# Patient Record
Sex: Female | Born: 1945
Health system: Southern US, Community
[De-identification: ages and names within clinical notes are randomized; demographics above are authoritative.]

## PROBLEM LIST (undated history)

## (undated) DIAGNOSIS — M1990 Unspecified osteoarthritis, unspecified site: Secondary | ICD-10-CM

## (undated) DIAGNOSIS — T8859XA Other complications of anesthesia, initial encounter: Secondary | ICD-10-CM

## (undated) DIAGNOSIS — M8580 Other specified disorders of bone density and structure, unspecified site: Secondary | ICD-10-CM

## (undated) DIAGNOSIS — C801 Malignant (primary) neoplasm, unspecified: Secondary | ICD-10-CM

## (undated) DIAGNOSIS — K754 Autoimmune hepatitis: Secondary | ICD-10-CM

## (undated) DIAGNOSIS — Z9889 Other specified postprocedural states: Secondary | ICD-10-CM

## (undated) DIAGNOSIS — K219 Gastro-esophageal reflux disease without esophagitis: Secondary | ICD-10-CM

## (undated) DIAGNOSIS — J45909 Unspecified asthma, uncomplicated: Secondary | ICD-10-CM

## (undated) DIAGNOSIS — E079 Disorder of thyroid, unspecified: Secondary | ICD-10-CM

## (undated) DIAGNOSIS — I999 Unspecified disorder of circulatory system: Secondary | ICD-10-CM

## (undated) DIAGNOSIS — T4145XA Adverse effect of unspecified anesthetic, initial encounter: Secondary | ICD-10-CM

## (undated) DIAGNOSIS — R112 Nausea with vomiting, unspecified: Secondary | ICD-10-CM

## (undated) DIAGNOSIS — E785 Hyperlipidemia, unspecified: Secondary | ICD-10-CM

## (undated) DIAGNOSIS — M549 Dorsalgia, unspecified: Secondary | ICD-10-CM

## (undated) DIAGNOSIS — T7840XA Allergy, unspecified, initial encounter: Secondary | ICD-10-CM

## (undated) DIAGNOSIS — E039 Hypothyroidism, unspecified: Secondary | ICD-10-CM

## (undated) DIAGNOSIS — K449 Diaphragmatic hernia without obstruction or gangrene: Secondary | ICD-10-CM

## (undated) HISTORY — PX: KNEE SURGERY: SHX244

## (undated) HISTORY — DX: Unspecified osteoarthritis, unspecified site: M19.90

## (undated) HISTORY — PX: BASAL CELL CARCINOMA EXCISION: SHX1214

## (undated) HISTORY — DX: Unspecified disorder of circulatory system: I99.9

## (undated) HISTORY — PX: OTHER SURGICAL HISTORY: SHX169

## (undated) HISTORY — PX: CATARACT EXTRACTION: SUR2

## (undated) HISTORY — PX: TUBAL LIGATION: SHX77

## (undated) HISTORY — PX: TONSILLECTOMY: SUR1361

## (undated) HISTORY — DX: Unspecified asthma, uncomplicated: J45.909

## (undated) HISTORY — PX: CYSTOSCOPY: SUR368

## (undated) HISTORY — DX: Diaphragmatic hernia without obstruction or gangrene: K44.9

## (undated) HISTORY — DX: Disorder of thyroid, unspecified: E07.9

## (undated) HISTORY — DX: Hyperlipidemia, unspecified: E78.5

## (undated) HISTORY — DX: Gastro-esophageal reflux disease without esophagitis: K21.9

## (undated) HISTORY — DX: Allergy, unspecified, initial encounter: T78.40XA

## (undated) HISTORY — DX: Other specified disorders of bone density and structure, unspecified site: M85.80

---

## 1997-04-02 ENCOUNTER — Other Ambulatory Visit: Admission: RE | Admit: 1997-04-02 | Discharge: 1997-04-02 | Payer: Self-pay | Admitting: Obstetrics and Gynecology

## 1998-04-13 ENCOUNTER — Other Ambulatory Visit: Admission: RE | Admit: 1998-04-13 | Discharge: 1998-04-13 | Payer: Self-pay | Admitting: Obstetrics and Gynecology

## 1999-04-25 ENCOUNTER — Other Ambulatory Visit: Admission: RE | Admit: 1999-04-25 | Discharge: 1999-04-25 | Payer: Self-pay | Admitting: Obstetrics and Gynecology

## 2000-05-02 ENCOUNTER — Other Ambulatory Visit: Admission: RE | Admit: 2000-05-02 | Discharge: 2000-05-02 | Payer: Self-pay | Admitting: Obstetrics and Gynecology

## 2001-08-12 ENCOUNTER — Other Ambulatory Visit: Admission: RE | Admit: 2001-08-12 | Discharge: 2001-08-12 | Payer: Self-pay | Admitting: Family Medicine

## 2003-05-25 ENCOUNTER — Other Ambulatory Visit: Admission: RE | Admit: 2003-05-25 | Discharge: 2003-05-25 | Payer: Self-pay | Admitting: Family Medicine

## 2003-06-08 ENCOUNTER — Encounter: Admission: RE | Admit: 2003-06-08 | Discharge: 2003-06-08 | Payer: Self-pay | Admitting: Family Medicine

## 2005-09-04 ENCOUNTER — Other Ambulatory Visit: Admission: RE | Admit: 2005-09-04 | Discharge: 2005-09-04 | Payer: Self-pay | Admitting: Family Medicine

## 2008-08-19 ENCOUNTER — Encounter: Admission: RE | Admit: 2008-08-19 | Discharge: 2008-08-19 | Payer: Self-pay | Admitting: Family Medicine

## 2008-08-20 ENCOUNTER — Encounter: Admission: RE | Admit: 2008-08-20 | Discharge: 2008-08-20 | Payer: Self-pay | Admitting: Family Medicine

## 2010-01-29 ENCOUNTER — Encounter: Payer: Self-pay | Admitting: Family Medicine

## 2011-01-23 DIAGNOSIS — J309 Allergic rhinitis, unspecified: Secondary | ICD-10-CM | POA: Diagnosis not present

## 2011-02-07 DIAGNOSIS — J309 Allergic rhinitis, unspecified: Secondary | ICD-10-CM | POA: Diagnosis not present

## 2011-02-16 DIAGNOSIS — J309 Allergic rhinitis, unspecified: Secondary | ICD-10-CM | POA: Diagnosis not present

## 2011-03-13 DIAGNOSIS — J309 Allergic rhinitis, unspecified: Secondary | ICD-10-CM | POA: Diagnosis not present

## 2011-03-16 DIAGNOSIS — J309 Allergic rhinitis, unspecified: Secondary | ICD-10-CM | POA: Diagnosis not present

## 2011-03-20 DIAGNOSIS — J309 Allergic rhinitis, unspecified: Secondary | ICD-10-CM | POA: Diagnosis not present

## 2011-03-28 DIAGNOSIS — J309 Allergic rhinitis, unspecified: Secondary | ICD-10-CM | POA: Diagnosis not present

## 2011-04-05 DIAGNOSIS — J309 Allergic rhinitis, unspecified: Secondary | ICD-10-CM | POA: Diagnosis not present

## 2011-04-16 DIAGNOSIS — J309 Allergic rhinitis, unspecified: Secondary | ICD-10-CM | POA: Diagnosis not present

## 2011-04-30 DIAGNOSIS — J309 Allergic rhinitis, unspecified: Secondary | ICD-10-CM | POA: Diagnosis not present

## 2011-05-03 DIAGNOSIS — J309 Allergic rhinitis, unspecified: Secondary | ICD-10-CM | POA: Diagnosis not present

## 2011-05-04 DIAGNOSIS — Z23 Encounter for immunization: Secondary | ICD-10-CM | POA: Diagnosis not present

## 2011-05-04 DIAGNOSIS — E039 Hypothyroidism, unspecified: Secondary | ICD-10-CM | POA: Diagnosis not present

## 2011-05-04 DIAGNOSIS — R7989 Other specified abnormal findings of blood chemistry: Secondary | ICD-10-CM | POA: Diagnosis not present

## 2011-05-04 DIAGNOSIS — E785 Hyperlipidemia, unspecified: Secondary | ICD-10-CM | POA: Diagnosis not present

## 2011-05-08 DIAGNOSIS — J309 Allergic rhinitis, unspecified: Secondary | ICD-10-CM | POA: Diagnosis not present

## 2011-05-17 DIAGNOSIS — J309 Allergic rhinitis, unspecified: Secondary | ICD-10-CM | POA: Diagnosis not present

## 2011-05-23 DIAGNOSIS — J309 Allergic rhinitis, unspecified: Secondary | ICD-10-CM | POA: Diagnosis not present

## 2011-05-30 DIAGNOSIS — J309 Allergic rhinitis, unspecified: Secondary | ICD-10-CM | POA: Diagnosis not present

## 2011-06-06 DIAGNOSIS — J309 Allergic rhinitis, unspecified: Secondary | ICD-10-CM | POA: Diagnosis not present

## 2011-06-18 DIAGNOSIS — J309 Allergic rhinitis, unspecified: Secondary | ICD-10-CM | POA: Diagnosis not present

## 2011-06-27 DIAGNOSIS — M899 Disorder of bone, unspecified: Secondary | ICD-10-CM | POA: Diagnosis not present

## 2011-07-04 DIAGNOSIS — J309 Allergic rhinitis, unspecified: Secondary | ICD-10-CM | POA: Diagnosis not present

## 2011-07-05 DIAGNOSIS — Z124 Encounter for screening for malignant neoplasm of cervix: Secondary | ICD-10-CM | POA: Diagnosis not present

## 2011-07-05 DIAGNOSIS — Z0000 Encounter for general adult medical examination without abnormal findings: Secondary | ICD-10-CM | POA: Diagnosis not present

## 2011-07-05 DIAGNOSIS — I10 Essential (primary) hypertension: Secondary | ICD-10-CM | POA: Diagnosis not present

## 2011-07-18 DIAGNOSIS — J309 Allergic rhinitis, unspecified: Secondary | ICD-10-CM | POA: Diagnosis not present

## 2011-07-20 DIAGNOSIS — J309 Allergic rhinitis, unspecified: Secondary | ICD-10-CM | POA: Diagnosis not present

## 2011-07-26 DIAGNOSIS — J309 Allergic rhinitis, unspecified: Secondary | ICD-10-CM | POA: Diagnosis not present

## 2011-07-30 DIAGNOSIS — H6120 Impacted cerumen, unspecified ear: Secondary | ICD-10-CM | POA: Diagnosis not present

## 2011-07-30 DIAGNOSIS — R87619 Unspecified abnormal cytological findings in specimens from cervix uteri: Secondary | ICD-10-CM | POA: Diagnosis not present

## 2011-07-30 DIAGNOSIS — Z124 Encounter for screening for malignant neoplasm of cervix: Secondary | ICD-10-CM | POA: Diagnosis not present

## 2011-08-02 DIAGNOSIS — J309 Allergic rhinitis, unspecified: Secondary | ICD-10-CM | POA: Diagnosis not present

## 2011-08-16 DIAGNOSIS — J309 Allergic rhinitis, unspecified: Secondary | ICD-10-CM | POA: Diagnosis not present

## 2011-08-23 DIAGNOSIS — J309 Allergic rhinitis, unspecified: Secondary | ICD-10-CM | POA: Diagnosis not present

## 2011-08-27 DIAGNOSIS — J309 Allergic rhinitis, unspecified: Secondary | ICD-10-CM | POA: Diagnosis not present

## 2011-08-30 DIAGNOSIS — J309 Allergic rhinitis, unspecified: Secondary | ICD-10-CM | POA: Diagnosis not present

## 2011-09-05 DIAGNOSIS — J309 Allergic rhinitis, unspecified: Secondary | ICD-10-CM | POA: Diagnosis not present

## 2011-09-12 DIAGNOSIS — J309 Allergic rhinitis, unspecified: Secondary | ICD-10-CM | POA: Diagnosis not present

## 2011-09-21 DIAGNOSIS — J309 Allergic rhinitis, unspecified: Secondary | ICD-10-CM | POA: Diagnosis not present

## 2011-10-03 DIAGNOSIS — J309 Allergic rhinitis, unspecified: Secondary | ICD-10-CM | POA: Diagnosis not present

## 2011-10-12 DIAGNOSIS — J309 Allergic rhinitis, unspecified: Secondary | ICD-10-CM | POA: Diagnosis not present

## 2011-10-19 DIAGNOSIS — J309 Allergic rhinitis, unspecified: Secondary | ICD-10-CM | POA: Diagnosis not present

## 2011-10-29 DIAGNOSIS — J301 Allergic rhinitis due to pollen: Secondary | ICD-10-CM | POA: Diagnosis not present

## 2011-10-29 DIAGNOSIS — J3089 Other allergic rhinitis: Secondary | ICD-10-CM | POA: Diagnosis not present

## 2011-10-29 DIAGNOSIS — J45909 Unspecified asthma, uncomplicated: Secondary | ICD-10-CM | POA: Diagnosis not present

## 2011-10-29 DIAGNOSIS — J309 Allergic rhinitis, unspecified: Secondary | ICD-10-CM | POA: Diagnosis not present

## 2011-10-30 DIAGNOSIS — E785 Hyperlipidemia, unspecified: Secondary | ICD-10-CM | POA: Diagnosis not present

## 2011-10-30 DIAGNOSIS — L989 Disorder of the skin and subcutaneous tissue, unspecified: Secondary | ICD-10-CM | POA: Diagnosis not present

## 2011-10-30 DIAGNOSIS — I10 Essential (primary) hypertension: Secondary | ICD-10-CM | POA: Diagnosis not present

## 2011-10-30 DIAGNOSIS — E039 Hypothyroidism, unspecified: Secondary | ICD-10-CM | POA: Diagnosis not present

## 2011-10-30 DIAGNOSIS — E559 Vitamin D deficiency, unspecified: Secondary | ICD-10-CM | POA: Diagnosis not present

## 2011-11-07 DIAGNOSIS — J309 Allergic rhinitis, unspecified: Secondary | ICD-10-CM | POA: Diagnosis not present

## 2011-11-16 DIAGNOSIS — J309 Allergic rhinitis, unspecified: Secondary | ICD-10-CM | POA: Diagnosis not present

## 2011-11-19 DIAGNOSIS — J309 Allergic rhinitis, unspecified: Secondary | ICD-10-CM | POA: Diagnosis not present

## 2011-11-29 DIAGNOSIS — J309 Allergic rhinitis, unspecified: Secondary | ICD-10-CM | POA: Diagnosis not present

## 2011-11-30 DIAGNOSIS — L259 Unspecified contact dermatitis, unspecified cause: Secondary | ICD-10-CM | POA: Diagnosis not present

## 2011-12-12 DIAGNOSIS — J309 Allergic rhinitis, unspecified: Secondary | ICD-10-CM | POA: Diagnosis not present

## 2011-12-17 DIAGNOSIS — J309 Allergic rhinitis, unspecified: Secondary | ICD-10-CM | POA: Diagnosis not present

## 2011-12-25 DIAGNOSIS — J309 Allergic rhinitis, unspecified: Secondary | ICD-10-CM | POA: Diagnosis not present

## 2012-01-10 DIAGNOSIS — J309 Allergic rhinitis, unspecified: Secondary | ICD-10-CM | POA: Diagnosis not present

## 2012-01-18 DIAGNOSIS — J309 Allergic rhinitis, unspecified: Secondary | ICD-10-CM | POA: Diagnosis not present

## 2012-01-25 DIAGNOSIS — J309 Allergic rhinitis, unspecified: Secondary | ICD-10-CM | POA: Diagnosis not present

## 2012-02-12 DIAGNOSIS — J309 Allergic rhinitis, unspecified: Secondary | ICD-10-CM | POA: Diagnosis not present

## 2012-02-27 DIAGNOSIS — J309 Allergic rhinitis, unspecified: Secondary | ICD-10-CM | POA: Diagnosis not present

## 2012-02-28 DIAGNOSIS — J309 Allergic rhinitis, unspecified: Secondary | ICD-10-CM | POA: Diagnosis not present

## 2012-03-13 DIAGNOSIS — J309 Allergic rhinitis, unspecified: Secondary | ICD-10-CM | POA: Diagnosis not present

## 2012-03-20 DIAGNOSIS — J309 Allergic rhinitis, unspecified: Secondary | ICD-10-CM | POA: Diagnosis not present

## 2012-04-04 DIAGNOSIS — J309 Allergic rhinitis, unspecified: Secondary | ICD-10-CM | POA: Diagnosis not present

## 2012-04-08 DIAGNOSIS — J309 Allergic rhinitis, unspecified: Secondary | ICD-10-CM | POA: Diagnosis not present

## 2012-04-09 DIAGNOSIS — Z1231 Encounter for screening mammogram for malignant neoplasm of breast: Secondary | ICD-10-CM | POA: Diagnosis not present

## 2012-04-11 DIAGNOSIS — J309 Allergic rhinitis, unspecified: Secondary | ICD-10-CM | POA: Diagnosis not present

## 2012-04-15 DIAGNOSIS — J309 Allergic rhinitis, unspecified: Secondary | ICD-10-CM | POA: Diagnosis not present

## 2012-04-23 DIAGNOSIS — J309 Allergic rhinitis, unspecified: Secondary | ICD-10-CM | POA: Diagnosis not present

## 2012-05-06 DIAGNOSIS — J309 Allergic rhinitis, unspecified: Secondary | ICD-10-CM | POA: Diagnosis not present

## 2012-05-15 DIAGNOSIS — J309 Allergic rhinitis, unspecified: Secondary | ICD-10-CM | POA: Diagnosis not present

## 2012-05-23 DIAGNOSIS — J309 Allergic rhinitis, unspecified: Secondary | ICD-10-CM | POA: Diagnosis not present

## 2012-06-03 DIAGNOSIS — J309 Allergic rhinitis, unspecified: Secondary | ICD-10-CM | POA: Diagnosis not present

## 2012-06-05 ENCOUNTER — Ambulatory Visit (INDEPENDENT_AMBULATORY_CARE_PROVIDER_SITE_OTHER): Payer: Medicare Other | Admitting: Family Medicine

## 2012-06-05 ENCOUNTER — Encounter: Payer: Self-pay | Admitting: Family Medicine

## 2012-06-05 VITALS — BP 129/82 | HR 58 | Temp 97.3°F | Wt 178.6 lb

## 2012-06-05 DIAGNOSIS — E785 Hyperlipidemia, unspecified: Secondary | ICD-10-CM | POA: Diagnosis not present

## 2012-06-05 DIAGNOSIS — E039 Hypothyroidism, unspecified: Secondary | ICD-10-CM | POA: Diagnosis not present

## 2012-06-05 DIAGNOSIS — J45909 Unspecified asthma, uncomplicated: Secondary | ICD-10-CM | POA: Insufficient documentation

## 2012-06-05 MED ORDER — NIACIN-LOVASTATIN ER 1000-40 MG PO TB24: 1.0000 | ORAL_TABLET | Freq: Every day | ORAL | Status: DC

## 2012-06-05 MED ORDER — LEVOTHYROXINE SODIUM 75 MCG PO TABS: 75.0000 ug | ORAL_TABLET | Freq: Every day | ORAL | Status: DC

## 2012-06-05 NOTE — Patient Instructions (Addendum)
      Dr Hao Dion's Recommendations  Diet and Exercise discussed with patient.  For nutrition information, I recommend books:  1).Eat to Live by Dr Joel Fuhrman. 2).Prevent and Reverse Heart Disease by Dr Caldwell Esselstyn. 3) Dr Neal Barnard's Book: Reversing Diabetes  Exercise recommendations are:  If unable to walk, then the patient can exercise in a chair 3 times a day. By flapping arms like a bird gently and raising legs outwards to the front.  If ambulatory, the patient can go for walks for 30 minutes 3 times a week. Then increase the intensity and duration as tolerated.  Goal is to try to attain exercise frequency to 5 times a week.  If applicable: Best to perform resistance exercises (machines or weights) 2 days a week and cardio type exercises 3 days per week.  

## 2012-06-05 NOTE — Progress Notes (Signed)
Patient ID: Cheryl Perry, female   DOB: Feb 22, 1945, 67 y.o.   MRN: [id number] SUBJECTIVE: HPI: Patient is here for follow up of hyperlipidemia/hypothyroidism  denies Headache;denies Chest Pain;denies weakness;denies Shortness of Breath and orthopnea;denies Visual changes;denies palpitations;denies cough;denies pedal edema;denies symptoms of TIA or stroke;deniesClaudication symptoms. admits to Compliance with medications; denies Problems with medications.  /  PMH/PSH: reviewed/updated in Epic  SH/FH: reviewed/updated in Epic  Allergies: reviewed/updated in Epic  Medications: reviewed/updated in Epic  Immunizations: reviewed/updated in Epic  ROS: As above in the HPI. All other systems are stable or negative.  OBJECTIVE: APPEARANCE:  Patient in no acute distress.The patient appeared well nourished and normally developed. Acyanotic. Waist: VITAL SIGNS:BP 129/82  Pulse 58  Temp(Src) 97.3 F (36.3 C) (Oral)  Wt 178 lb 9.6 oz (81.012 kg) WF looks well.  SKIN: warm and  Dry without overt rashes, tattoos and scars  HEAD and Neck: without JVD, Head and scalp: normal Eyes:No scleral icterus. Fundi normal, eye movements normal. Ears: Auricle normal, canal normal, Tympanic membranes normal, insufflation normal. Nose: normal Throat: normal Neck & thyroid: normal  CHEST & LUNGS: Chest wall: normal Lungs: Clear  CVS: Reveals the PMI to be normally located. Regular rhythm, First and Second Heart sounds are normal,  absence of murmurs, rubs or gallops. Peripheral vasculature: Radial pulses: normal Dorsal pedis pulses: normal Posterior pulses: normal  ABDOMEN:  Appearance: normal Benign,, no organomegaly, no masses, no Abdominal Aortic enlargement. No Guarding , no rebound. No Bruits. Bowel sounds: normal  RECTAL: N/A GU: N/A  EXTREMETIES: nonedematous. Both Femoral and Pedal pulses are normal.  MUSCULOSKELETAL:  Spine: normal Joints: intact  NEUROLOGIC:  oriented to time,place and person; nonfocal. Strength is normal Sensory is normal Reflexes are normal Cranial Nerves are normal.  ASSESSMENT: HLD (hyperlipidemia) - Plan: Niacin-Lovastatin (ADVICOR) 1000-40 MG TB24, COMPLETE METABOLIC PANEL WITH GFR, NMR Lipoprofile with Lipids  Unspecified hypothyroidism - Plan: levothyroxine (SYNTHROID, LEVOTHROID) 75 MCG tablet, TSH, DISCONTINUED: levothyroxine (SYNTHROID, LEVOTHROID) 75 MCG tablet  Unspecified asthma(493.90)  PLAN:  Orders Placed This Encounter  Procedures  . TSH  . COMPLETE METABOLIC PANEL WITH GFR  . NMR Lipoprofile with Lipids   Meds ordered this encounter  Medications  . DISCONTD: levothyroxine (SYNTHROID, LEVOTHROID) 75 MCG tablet    Sig: Take 75 mcg by mouth daily before breakfast.  . DISCONTD: Niacin-Lovastatin (ADVICOR) 1000-40 MG TB24    Sig: Take 1 tablet by mouth at bedtime.  . montelukast (SINGULAIR) 10 MG tablet    Sig: Take 10 mg by mouth at bedtime.  Marland Kitchen omeprazole (PRILOSEC) 20 MG capsule    Sig: Take 20 mg by mouth daily.  Marland Kitchen albuterol (PROVENTIL HFA) 108 (90 BASE) MCG/ACT inhaler    Sig: Inhale 2 puffs into the lungs every 6 (six) hours as needed for wheezing. May use only 3 times a month  . DISCONTD: levothyroxine (SYNTHROID, LEVOTHROID) 75 MCG tablet    Sig: Take 1 tablet (75 mcg total) by mouth daily before breakfast.    Dispense:  30 tablet    Refill:  0  . Niacin-Lovastatin (ADVICOR) 1000-40 MG TB24    Sig: Take 1 tablet by mouth at bedtime.    Dispense:  90 each    Refill:  3  . levothyroxine (SYNTHROID, LEVOTHROID) 75 MCG tablet    Sig: Take 1 tablet (75 mcg total) by mouth daily before breakfast.    Dispense:  90 tablet    Refill:  3   Await labs.  Dr Woodroe Mode Recommendations  Diet and Exercise discussed with patient.  For nutrition information, I recommend books:  1).Eat to Live by Dr Monico Hoar. 2).Prevent and Reverse Heart Disease by Dr Suzzette Righter. 3) Dr Katherina Right Book: Reversing Diabetes  Exercise recommendations are:  If unable to walk, then the patient can exercise in a chair 3 times a day. By flapping arms like a bird gently and raising legs outwards to the front.  If ambulatory, the patient can go for walks for 30 minutes 3 times a week. Then increase the intensity and duration as tolerated.  Goal is to try to attain exercise frequency to 5 times a week.  If applicable: Best to perform resistance exercises (machines or weights) 2 days a week and cardio type exercises 3 days per week.  Return in about 6 months (around 12/06/2012) for Recheck medical problems.  Safia Panzer P. [place] Charon, M.D.

## 2012-06-06 DIAGNOSIS — J309 Allergic rhinitis, unspecified: Secondary | ICD-10-CM | POA: Diagnosis not present

## 2012-06-06 LAB — COMPLETE METABOLIC PANEL WITH GFR
ALT: 20 U/L (ref 0–35)
AST: 22 U/L (ref 0–37)
Albumin: 4.3 g/dL (ref 3.5–5.2)
Alkaline Phosphatase: 72 U/L (ref 39–117)
BUN: 22 mg/dL (ref 6–23)
CO2: 28 mEq/L (ref 19–32)
Calcium: 9.4 mg/dL (ref 8.4–10.5)
Chloride: 101 mEq/L (ref 96–112)
Creat: 0.78 mg/dL (ref 0.50–1.10)
GFR, Est African American: >89 mL/min
GFR, Est Non African American: 79 mL/min
Glucose, Bld: 85 mg/dL (ref 70–99)
Potassium: 4.5 mEq/L (ref 3.5–5.3)
Sodium: 138 mEq/L (ref 135–145)
Total Bilirubin: 0.4 mg/dL (ref 0.3–1.2)
Total Protein: 6.4 g/dL (ref 6.0–8.3)

## 2012-06-06 LAB — NMR LIPOPROFILE WITH LIPIDS
Cholesterol, Total: 228 mg/dL — ABNORMAL HIGH (ref <200)
HDL Particle Number: 44.3 umol/L (ref >=30.5)
HDL Size: 9.8 nm (ref >=9.2)
HDL-C: 75 mg/dL (ref >=40)
LDL (calc): 135 mg/dL — ABNORMAL HIGH (ref <100)
LDL Particle Number: 1623 nmol/L — ABNORMAL HIGH (ref <1000)
LDL Size: 21.6 nm (ref >20.5)
LP-IR Score: <25 (ref <=45)
Large HDL-P: 14.2 umol/L (ref >=4.8)
Large VLDL-P: 1.4 nmol/L (ref <=2.7)
Small LDL Particle Number: 328 nmol/L (ref <=527)
Triglycerides: 92 mg/dL (ref <150)
VLDL Size: 40.3 nm (ref <=46.6)

## 2012-06-06 LAB — TSH: TSH: 4.097 u[IU]/mL (ref 0.350–4.500)

## 2012-06-10 NOTE — Progress Notes (Signed)
Quick Note:  Lab result at goal. The LDLc and the LDL p is very high but so is the HDLc. No change in Medications for now. No Change in plans and follow up. ______

## 2012-06-20 DIAGNOSIS — J309 Allergic rhinitis, unspecified: Secondary | ICD-10-CM | POA: Diagnosis not present

## 2012-07-01 ENCOUNTER — Other Ambulatory Visit: Payer: Self-pay | Admitting: Family Medicine

## 2012-07-04 DIAGNOSIS — J309 Allergic rhinitis, unspecified: Secondary | ICD-10-CM | POA: Diagnosis not present

## 2012-07-09 DIAGNOSIS — J45909 Unspecified asthma, uncomplicated: Secondary | ICD-10-CM | POA: Diagnosis not present

## 2012-07-09 DIAGNOSIS — J301 Allergic rhinitis due to pollen: Secondary | ICD-10-CM | POA: Diagnosis not present

## 2012-07-09 DIAGNOSIS — J3089 Other allergic rhinitis: Secondary | ICD-10-CM | POA: Diagnosis not present

## 2012-07-09 DIAGNOSIS — R05 Cough: Secondary | ICD-10-CM | POA: Diagnosis not present

## 2012-07-17 DIAGNOSIS — J309 Allergic rhinitis, unspecified: Secondary | ICD-10-CM | POA: Diagnosis not present

## 2012-08-01 DIAGNOSIS — J309 Allergic rhinitis, unspecified: Secondary | ICD-10-CM | POA: Diagnosis not present

## 2012-08-05 DIAGNOSIS — J309 Allergic rhinitis, unspecified: Secondary | ICD-10-CM | POA: Diagnosis not present

## 2012-08-13 ENCOUNTER — Other Ambulatory Visit: Payer: Self-pay

## 2012-08-14 DIAGNOSIS — J309 Allergic rhinitis, unspecified: Secondary | ICD-10-CM | POA: Diagnosis not present

## 2012-08-27 DIAGNOSIS — J309 Allergic rhinitis, unspecified: Secondary | ICD-10-CM | POA: Diagnosis not present

## 2012-09-11 DIAGNOSIS — J309 Allergic rhinitis, unspecified: Secondary | ICD-10-CM | POA: Diagnosis not present

## 2012-09-17 DIAGNOSIS — J309 Allergic rhinitis, unspecified: Secondary | ICD-10-CM | POA: Diagnosis not present

## 2012-09-24 DIAGNOSIS — J309 Allergic rhinitis, unspecified: Secondary | ICD-10-CM | POA: Diagnosis not present

## 2012-09-25 DIAGNOSIS — J309 Allergic rhinitis, unspecified: Secondary | ICD-10-CM | POA: Diagnosis not present

## 2012-10-08 DIAGNOSIS — J309 Allergic rhinitis, unspecified: Secondary | ICD-10-CM | POA: Diagnosis not present

## 2012-10-17 DIAGNOSIS — J309 Allergic rhinitis, unspecified: Secondary | ICD-10-CM | POA: Diagnosis not present

## 2012-10-21 DIAGNOSIS — J309 Allergic rhinitis, unspecified: Secondary | ICD-10-CM | POA: Diagnosis not present

## 2012-10-30 DIAGNOSIS — J301 Allergic rhinitis due to pollen: Secondary | ICD-10-CM | POA: Diagnosis not present

## 2012-10-30 DIAGNOSIS — J309 Allergic rhinitis, unspecified: Secondary | ICD-10-CM | POA: Diagnosis not present

## 2012-10-30 DIAGNOSIS — J45909 Unspecified asthma, uncomplicated: Secondary | ICD-10-CM | POA: Diagnosis not present

## 2012-10-30 DIAGNOSIS — J3089 Other allergic rhinitis: Secondary | ICD-10-CM | POA: Diagnosis not present

## 2012-11-05 DIAGNOSIS — J309 Allergic rhinitis, unspecified: Secondary | ICD-10-CM | POA: Diagnosis not present

## 2012-11-13 ENCOUNTER — Other Ambulatory Visit: Payer: Self-pay

## 2012-11-13 DIAGNOSIS — J309 Allergic rhinitis, unspecified: Secondary | ICD-10-CM | POA: Diagnosis not present

## 2012-11-19 DIAGNOSIS — J309 Allergic rhinitis, unspecified: Secondary | ICD-10-CM | POA: Diagnosis not present

## 2012-11-28 DIAGNOSIS — J309 Allergic rhinitis, unspecified: Secondary | ICD-10-CM | POA: Diagnosis not present

## 2012-12-03 DIAGNOSIS — J309 Allergic rhinitis, unspecified: Secondary | ICD-10-CM | POA: Diagnosis not present

## 2012-12-09 ENCOUNTER — Encounter: Payer: Self-pay | Admitting: Family Medicine

## 2012-12-09 ENCOUNTER — Ambulatory Visit (INDEPENDENT_AMBULATORY_CARE_PROVIDER_SITE_OTHER): Payer: Medicare Other | Admitting: Family Medicine

## 2012-12-09 VITALS — BP 148/89 | HR 54 | Temp 97.1°F | Ht 65.0 in | Wt 172.4 lb

## 2012-12-09 DIAGNOSIS — J45909 Unspecified asthma, uncomplicated: Secondary | ICD-10-CM | POA: Diagnosis not present

## 2012-12-09 DIAGNOSIS — E785 Hyperlipidemia, unspecified: Secondary | ICD-10-CM

## 2012-12-09 DIAGNOSIS — E039 Hypothyroidism, unspecified: Secondary | ICD-10-CM

## 2012-12-09 MED ORDER — NIACIN-LOVASTATIN ER 1000-40 MG PO TB24: 1.0000 | ORAL_TABLET | Freq: Every day | ORAL | Status: DC

## 2012-12-09 MED ORDER — LEVOTHYROXINE SODIUM 75 MCG PO TABS: ORAL_TABLET | ORAL | Status: DC

## 2012-12-09 NOTE — Progress Notes (Signed)
Patient ID: Cheryl Perry, female   DOB: June 25, 1945, 67 y.o.   MRN: [id number] SUBJECTIVE: CC: Chief Complaint  Patient presents with  . Follow-up    6 month follow up   NEEDS WRITTEN RX FOR MEDS    HPI: Patient is here for follow up of hyperlipidemia/hypothyroidism: denies Headache;denies Chest Pain;denies weakness;denies Shortness of Breath and orthopnea;denies Visual changes;denies palpitations;denies cough;denies pedal edema;denies symptoms of TIA or stroke;deniesClaudication symptoms. admits to Compliance with medications; denies Problems with medications.  Saw allergist. Doing well.  Had to see GI for digestive problems. Has a spastic digestive symptoms. Had to change diet and eliminate cheese. Was following husband's diet. Resolved with eliminating artificial sweetener. Sees Dr Kinnie Scales.   doing great now.   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 Past Medical History  Diagnosis Date  . Thyroid disease   . Hyperlipidemia   . Allergy   . Asthma    Past Surgical History  Procedure Laterality Date  . Tubal ligation     History   Social History  . Marital Status: Married    Spouse Name: N/A    Number of Children: N/A  . Years of Education: N/A   Occupational History  . Not on file.   Social History Main Topics  . Smoking status: Former Smoker    Quit date: 06/06/1974  . Smokeless tobacco: Not on file  . Alcohol Use: Not on file  . Drug Use: Not on file  . Sexual Activity: Not on file   Other Topics Concern  . Not on file   Social History Narrative  . No narrative on file   No family history on file. Current Outpatient Prescriptions on File Prior to Visit  Medication Sig Dispense Refill  . albuterol (PROVENTIL HFA) 108 (90 BASE) MCG/ACT inhaler Inhale 2 puffs into the lungs every 6 (six) hours as needed for wheezing. May use only 3 times a month      . montelukast (SINGULAIR) 10 MG tablet Take 10 mg by mouth at bedtime.      Marland Kitchen omeprazole  (PRILOSEC) 20 MG capsule Take 20 mg by mouth daily.       No current facility-administered medications on file prior to visit.   Allergies  Allergen Reactions  . Ampicillin     rash  . Avelox [Moxifloxacin Hcl In Nacl]     Rash   . Bactrim [Sulfamethoxazole-Trimethoprim]     Rash   . Levaquin [Levofloxacin In D5w]     Rash   . Lipitor [Atorvastatin]     mylagias  . Macrodantin [Nitrofurantoin Macrocrystal]     Rash   . Premarin [Conjugated Estrogens]     Itching tightness in chest  . Trental [Pentoxifylline Er]     Rash    Immunization History  Administered Date(s) Administered  . Influenza-Unspecified 12/09/2012  . Pneumococcal-Unspecified 04/09/2011  . Tdap 06/09/2003  . Zoster 04/09/2010   Prior to Admission medications   Medication Sig Start Date End Date Taking? Authorizing Provider  albuterol (PROVENTIL HFA) 108 (90 BASE) MCG/ACT inhaler Inhale 2 puffs into the lungs every 6 (six) hours as needed for wheezing. May use only 3 times a month   Yes Historical Provider, MD  hyoscyamine (LEVSIN, ANASPAZ) 0.125 MG tablet Take 0.125 mg by mouth every 4 (four) hours as needed.   Yes Historical Provider, MD  levothyroxine (SYNTHROID, LEVOTHROID) 75 MCG tablet TAKE 1 TABLET (75 MCG TOTAL)  BY MOUTH DAILY BEFORE BREAKFAST. 07/01/12  Yes Thelma Barge  Casimiro Needle, MD  montelukast (SINGULAIR) 10 MG tablet Take 10 mg by mouth at bedtime.   Yes Historical Provider, MD  Niacin-Lovastatin (ADVICOR) 1000-40 MG TB24 Take 1 tablet by mouth at bedtime. 06/05/12  Yes Ileana Ladd, MD  omeprazole (PRILOSEC) 20 MG capsule Take 20 mg by mouth daily.   Yes Historical Provider, MD     ROS: As above in the HPI. All other systems are stable or negative.  OBJECTIVE: APPEARANCE:  Patient in no acute distress.The patient appeared well nourished and normally developed. Acyanotic. Waist: VITAL SIGNS:BP 148/89  Pulse 54  Temp(Src) 97.1 F (36.2 C) (Oral)  Ht 5\' 5"  (1.651 m)  Wt 172 lb 6.4 oz (78.2  kg)  BMI 28.69 kg/m2  WF BP 124/85 Looks very well.  SKIN: warm and  Dry without overt rashes, tattoos and scars  HEAD and Neck: without JVD, Head and scalp: normal Eyes:No scleral icterus. Fundi normal, eye movements normal. Ears: Auricle normal, canal normal, Tympanic membranes normal, insufflation normal. Nose: normal Throat: normal Neck & thyroid: normal  CHEST & LUNGS: Chest wall: normal Lungs: Clear  CVS: Reveals the PMI to be normally located. Regular rhythm, First and Second Heart sounds are normal,  absence of murmurs, rubs or gallops. Peripheral vasculature: Radial pulses: normal Dorsal pedis pulses: normal Posterior pulses: normal  ABDOMEN:  Appearance: normal Benign, no organomegaly, no masses, no Abdominal Aortic enlargement. No Guarding , no rebound. No Bruits. Bowel sounds: normal  RECTAL: N/A GU: N/A  EXTREMETIES: nonedematous.  MUSCULOSKELETAL:  Spine: normal Joints: intact  NEUROLOGIC: oriented to time,place and person; nonfocal. Strength is normal Sensory is normal Reflexes are normal Cranial Nerves are normal.  ASSESSMENT:  Unspecified hypothyroidism - Plan: levothyroxine (SYNTHROID, LEVOTHROID) 75 MCG tablet, TSH  Unspecified asthma(493.90)  HLD (hyperlipidemia) - Plan: Niacin-Lovastatin (ADVICOR) 1000-40 MG TB24, CMP14+EGFR, NMR, lipoprofile  Doing well. Now retired.  PLAN:        Dr Woodroe Mode Recommendations  For nutrition information, I recommend books:  1).Eat to Live by Dr Monico Hoar. 2).Prevent and Reverse Heart Disease by Dr Suzzette Righter. 3) Dr Katherina Right Book:  Program to Reverse Diabetes  Exercise recommendations are:  If unable to walk, then the patient can exercise in a chair 3 times a day. By flapping arms like a bird gently and raising legs outwards to the front.  If ambulatory, the patient can go for walks for 30 minutes 3 times a week. Then increase the intensity and duration as tolerated.   Goal is to try to attain exercise frequency to 5 times a week.  If applicable: Best to perform resistance exercises (machines or weights) 2 days a week and cardio type exercises 3 days per week.  Orders Placed This Encounter  Procedures  . CMP14+EGFR  . NMR, lipoprofile  . TSH   Meds ordered this encounter  Medications  . hyoscyamine (LEVSIN, ANASPAZ) 0.125 MG tablet    Sig: Take 0.125 mg by mouth every 4 (four) hours as needed.  . Niacin-Lovastatin (ADVICOR) 1000-40 MG TB24    Sig: Take 1 tablet by mouth at bedtime.    Dispense:  90 each    Refill:  3  . levothyroxine (SYNTHROID, LEVOTHROID) 75 MCG tablet    Sig: TAKE 1 TABLET (75 MCG TOTAL)  BY MOUTH DAILY BEFORE BREAKFAST.    Dispense:  90 tablet    Refill:  3   Medications Discontinued During This Encounter  Medication Reason  . levothyroxine (SYNTHROID, LEVOTHROID) 75 MCG  tablet Duplicate  . Niacin-Lovastatin (ADVICOR) 1000-40 MG TB24 Reorder  . levothyroxine (SYNTHROID, LEVOTHROID) 75 MCG tablet Reorder   Return in about 6 months (around 06/09/2013) for Recheck medical problems.  Daryana Whirley P. [place] Charon, M.D.

## 2012-12-09 NOTE — Patient Instructions (Signed)
      Dr Drenda Sobecki's Recommendations  For nutrition information, I recommend books:  1).Eat to Live by Dr Joel Fuhrman. 2).Prevent and Reverse Heart Disease by Dr Caldwell Esselstyn. 3) Dr Neal Barnard's Book:  Program to Reverse Diabetes  Exercise recommendations are:  If unable to walk, then the patient can exercise in a chair 3 times a day. By flapping arms like a bird gently and raising legs outwards to the front.  If ambulatory, the patient can go for walks for 30 minutes 3 times a week. Then increase the intensity and duration as tolerated.  Goal is to try to attain exercise frequency to 5 times a week.  If applicable: Best to perform resistance exercises (machines or weights) 2 days a week and cardio type exercises 3 days per week.  

## 2012-12-10 DIAGNOSIS — J309 Allergic rhinitis, unspecified: Secondary | ICD-10-CM | POA: Diagnosis not present

## 2012-12-11 LAB — CMP14+EGFR
ALT: 25 IU/L (ref 0–32)
AST: 31 IU/L (ref 0–40)
Albumin/Globulin Ratio: 2.2 (ref 1.1–2.5)
Albumin: 4.2 g/dL (ref 3.6–4.8)
Alkaline Phosphatase: 82 IU/L (ref 39–117)
BUN/Creatinine Ratio: 20 (ref 11–26)
BUN: 14 mg/dL (ref 8–27)
CO2: 22 mmol/L (ref 18–29)
Calcium: 9.5 mg/dL (ref 8.6–10.2)
Chloride: 104 mmol/L (ref 97–108)
Creatinine, Ser: 0.7 mg/dL (ref 0.57–1.00)
GFR calc Af Amer: 104 mL/min/{1.73_m2} (ref >59)
GFR calc non Af Amer: 90 mL/min/{1.73_m2} (ref >59)
Globulin, Total: 1.9 g/dL (ref 1.5–4.5)
Glucose: 86 mg/dL (ref 65–99)
Potassium: 4.3 mmol/L (ref 3.5–5.2)
Sodium: 142 mmol/L (ref 134–144)
Total Bilirubin: 0.4 mg/dL (ref 0.0–1.2)
Total Protein: 6.1 g/dL (ref 6.0–8.5)

## 2012-12-11 LAB — NMR, LIPOPROFILE
Cholesterol: 271 mg/dL — ABNORMAL HIGH (ref <200)
HDL Cholesterol by NMR: 82 mg/dL (ref >=40)
HDL Particle Number: 45.9 umol/L (ref >=30.5)
LDL Particle Number: 1825 nmol/L — ABNORMAL HIGH (ref <1000)
LDL Size: 22.2 nm (ref >20.5)
LDLC SERPL CALC-MCNC: 174 mg/dL — ABNORMAL HIGH (ref <100)
LP-IR Score: <25 (ref <=45)
Small LDL Particle Number: 102 nmol/L (ref <=527)
Triglycerides by NMR: 75 mg/dL (ref <150)

## 2012-12-11 LAB — TSH: TSH: 3.06 u[IU]/mL (ref 0.450–4.500)

## 2012-12-22 DIAGNOSIS — J301 Allergic rhinitis due to pollen: Secondary | ICD-10-CM | POA: Diagnosis not present

## 2012-12-30 DIAGNOSIS — J309 Allergic rhinitis, unspecified: Secondary | ICD-10-CM | POA: Diagnosis not present

## 2013-01-08 HISTORY — PX: COLONOSCOPY: SHX174

## 2013-01-13 DIAGNOSIS — J309 Allergic rhinitis, unspecified: Secondary | ICD-10-CM | POA: Diagnosis not present

## 2013-01-26 DIAGNOSIS — K648 Other hemorrhoids: Secondary | ICD-10-CM | POA: Diagnosis not present

## 2013-01-26 DIAGNOSIS — Z86010 Personal history of colonic polyps: Secondary | ICD-10-CM | POA: Diagnosis not present

## 2013-01-26 DIAGNOSIS — K5730 Diverticulosis of large intestine without perforation or abscess without bleeding: Secondary | ICD-10-CM | POA: Diagnosis not present

## 2013-01-27 DIAGNOSIS — J309 Allergic rhinitis, unspecified: Secondary | ICD-10-CM | POA: Diagnosis not present

## 2013-02-02 ENCOUNTER — Ambulatory Visit (INDEPENDENT_AMBULATORY_CARE_PROVIDER_SITE_OTHER): Payer: Medicare Other | Admitting: Family Medicine

## 2013-02-02 ENCOUNTER — Encounter: Payer: Self-pay | Admitting: Family Medicine

## 2013-02-02 VITALS — BP 109/73 | HR 91 | Temp 98.2°F | Ht 65.5 in | Wt 171.4 lb

## 2013-02-02 DIAGNOSIS — E785 Hyperlipidemia, unspecified: Secondary | ICD-10-CM | POA: Diagnosis not present

## 2013-02-02 DIAGNOSIS — E039 Hypothyroidism, unspecified: Secondary | ICD-10-CM | POA: Diagnosis not present

## 2013-02-02 DIAGNOSIS — J45909 Unspecified asthma, uncomplicated: Secondary | ICD-10-CM | POA: Diagnosis not present

## 2013-02-02 DIAGNOSIS — J069 Acute upper respiratory infection, unspecified: Secondary | ICD-10-CM | POA: Diagnosis not present

## 2013-02-02 DIAGNOSIS — J029 Acute pharyngitis, unspecified: Secondary | ICD-10-CM | POA: Diagnosis not present

## 2013-02-02 LAB — POCT INFLUENZA A/B
Influenza A, POC: NEGATIVE
Influenza B, POC: NEGATIVE

## 2013-02-02 LAB — POCT RAPID STREP A (OFFICE): Rapid Strep A Screen: NEGATIVE

## 2013-02-02 NOTE — Progress Notes (Signed)
Patient ID: KEIDRA WITHERS, female   DOB: 1945-11-14, 68 y.o.   MRN: [id number] SUBJECTIVE: CC: Chief Complaint  Patient presents with  . Acute Visit    cough sore throat    HPI: Sore Throat Patient complains of sore throat. Associated symptoms include chills, coryza, dry cough, hoarseness, nasal blockage, post nasal drip, sinus and nasal congestion and sore throat. Onset of symptoms was 2 days ago, and have been gradually worsening since that time. She is drinking plenty of fluids. She has not had recent close exposure to someone with proven streptococcal pharyngitis. Past Medical History  Diagnosis Date  . Thyroid disease   . Hyperlipidemia   . Allergy   . Asthma    Past Surgical History  Procedure Laterality Date  . Tubal ligation     History   Social History  . Marital Status: Married    Spouse Name: N/A    Number of Children: N/A  . Years of Education: N/A   Occupational History  . Not on file.   Social History Main Topics  . Smoking status: Former Smoker    Quit date: 06/06/1974  . Smokeless tobacco: Not on file  . Alcohol Use: Not on file  . Drug Use: Not on file  . Sexual Activity: Not on file   Other Topics Concern  . Not on file   Social History Narrative  . No narrative on file   No family history on file. Current Outpatient Prescriptions on File Prior to Visit  Medication Sig Dispense Refill  . albuterol (PROVENTIL HFA) 108 (90 BASE) MCG/ACT inhaler Inhale 2 puffs into the lungs every 6 (six) hours as needed for wheezing. May use only 3 times a month      . hyoscyamine (LEVSIN, ANASPAZ) 0.125 MG tablet Take 0.125 mg by mouth every 4 (four) hours as needed.      Marland Kitchen levothyroxine (SYNTHROID, LEVOTHROID) 75 MCG tablet TAKE 1 TABLET (75 MCG TOTAL)  BY MOUTH DAILY BEFORE BREAKFAST.  90 tablet  3  . montelukast (SINGULAIR) 10 MG tablet Take 10 mg by mouth at bedtime.      . Niacin-Lovastatin (ADVICOR) 1000-40 MG TB24 Take 1 tablet by mouth at bedtime.  90  each  3  . omeprazole (PRILOSEC) 20 MG capsule Take 20 mg by mouth daily.       No current facility-administered medications on file prior to visit.   Allergies  Allergen Reactions  . Ampicillin     rash  . Avelox [Moxifloxacin Hcl In Nacl]     Rash   . Bactrim [Sulfamethoxazole-Trimethoprim]     Rash   . Levaquin [Levofloxacin In D5w]     Rash   . Lipitor [Atorvastatin]     mylagias  . Macrodantin [Nitrofurantoin Macrocrystal]     Rash   . Premarin [Conjugated Estrogens]     Itching tightness in chest  . Trental [Pentoxifylline Er]     Rash    Immunization History  Administered Date(s) Administered  . Influenza-Unspecified 12/09/2012  . Pneumococcal-Unspecified 04/09/2011  . Tdap 06/09/2003  . Zoster 04/09/2010   Prior to Admission medications   Medication Sig Start Date End Date Taking? Authorizing Provider  albuterol (PROVENTIL HFA) 108 (90 BASE) MCG/ACT inhaler Inhale 2 puffs into the lungs every 6 (six) hours as needed for wheezing. May use only 3 times a month    Historical Provider, MD  hyoscyamine (LEVSIN, ANASPAZ) 0.125 MG tablet Take 0.125 mg by mouth every 4 (four) hours as  needed.    Historical Provider, MD  levothyroxine (SYNTHROID, LEVOTHROID) 75 MCG tablet TAKE 1 TABLET (75 MCG TOTAL)  BY MOUTH DAILY BEFORE BREAKFAST. 12/09/12   Vernie Shanks, MD  montelukast (SINGULAIR) 10 MG tablet Take 10 mg by mouth at bedtime.    Historical Provider, MD  Niacin-Lovastatin (ADVICOR) 1000-40 MG TB24 Take 1 tablet by mouth at bedtime. 12/09/12   Vernie Shanks, MD  omeprazole (PRILOSEC) 20 MG capsule Take 20 mg by mouth daily.    Historical Provider, MD     ROS: As above in the HPI. All other systems are stable or negative.  OBJECTIVE: APPEARANCE:  Patient in no acute distress.The patient appeared well nourished and normally developed. Acyanotic. Waist: VITAL SIGNS:BP 109/73  Pulse 91  Temp(Src) 98.2 F (36.8 C) (Oral)  Ht 5' 5.5" (1.664 m)  Wt 171 lb 6.4 oz  (77.747 kg)  BMI 28.08 kg/m2  SpO2 98% WF   SKIN: warm and  Dry without overt rashes, tattoos and scars  HEAD and Neck: without JVD, Head and scalp: normal Eyes:No scleral icterus. Fundi normal, eye movements normal. Ears: Auricle normal, canal normal, Tympanic membranes normal, insufflation normal. Nose: nasal congestion Throat: very red with red dots on the uvula and soft palate Neck & thyroid: normal  CHEST & LUNGS: Chest wall: normal Lungs: Clear  CVS: Reveals the PMI to be normally located. Regular rhythm, First and Second Heart sounds are normal,  absence of murmurs, rubs or gallops. Peripheral vasculature: Radial pulses: normal Dorsal pedis pulses: normal Posterior pulses: normal  ABDOMEN:  Appearance: normal Benign, no organomegaly, no masses, no Abdominal Aortic enlargement. No Guarding , no rebound. No Bruits. Bowel sounds: normal  RECTAL: N/A GU: N/A  EXTREMETIES: nonedematous.  MUSCULOSKELETAL:  Spine: normal Joints: intact  NEUROLOGIC: oriented to time,place and person; nonfocal. Strength is normal Sensory is normal Reflexes are normal Cranial Nerves are normal.  ASSESSMENT:  Sore throat - Plan: POCT Influenza A/B, POCT rapid strep A  Upper respiratory infection  HLD (hyperlipidemia)  Unspecified hypothyroidism  Unspecified asthma(493.90) Most likely viral.  PLAN: Handout on URI in the AVS  Orders Placed This Encounter  Procedures  . POCT Influenza A/B  . POCT rapid strep A   Results for orders placed in visit on 02/02/13  POCT INFLUENZA A/B      Result Value Range   Influenza A, POC Negative     Influenza B, POC Negative    POCT RAPID STREP A (OFFICE)      Result Value Range   Rapid Strep A Screen Negative  Negative    No orders of the defined types were placed in this encounter.   There are no discontinued medications. Return if symptoms worsen or fail to improve.  Ronae Noell P. Jacelyn Grip, M.D.

## 2013-02-02 NOTE — Patient Instructions (Signed)

## 2013-02-13 DIAGNOSIS — J309 Allergic rhinitis, unspecified: Secondary | ICD-10-CM | POA: Diagnosis not present

## 2013-02-18 DIAGNOSIS — K648 Other hemorrhoids: Secondary | ICD-10-CM | POA: Diagnosis not present

## 2013-03-04 DIAGNOSIS — J309 Allergic rhinitis, unspecified: Secondary | ICD-10-CM | POA: Diagnosis not present

## 2013-03-11 DIAGNOSIS — K648 Other hemorrhoids: Secondary | ICD-10-CM | POA: Diagnosis not present

## 2013-03-13 DIAGNOSIS — J309 Allergic rhinitis, unspecified: Secondary | ICD-10-CM | POA: Diagnosis not present

## 2013-03-17 DIAGNOSIS — J309 Allergic rhinitis, unspecified: Secondary | ICD-10-CM | POA: Diagnosis not present

## 2013-03-25 DIAGNOSIS — J309 Allergic rhinitis, unspecified: Secondary | ICD-10-CM | POA: Diagnosis not present

## 2013-03-30 DIAGNOSIS — J309 Allergic rhinitis, unspecified: Secondary | ICD-10-CM | POA: Diagnosis not present

## 2013-04-14 DIAGNOSIS — J309 Allergic rhinitis, unspecified: Secondary | ICD-10-CM | POA: Diagnosis not present

## 2013-04-17 ENCOUNTER — Encounter: Payer: Self-pay | Admitting: *Deleted

## 2013-04-21 DIAGNOSIS — J309 Allergic rhinitis, unspecified: Secondary | ICD-10-CM | POA: Diagnosis not present

## 2013-04-28 DIAGNOSIS — J309 Allergic rhinitis, unspecified: Secondary | ICD-10-CM | POA: Diagnosis not present

## 2013-04-30 ENCOUNTER — Encounter: Payer: Self-pay | Admitting: Family Medicine

## 2013-04-30 ENCOUNTER — Ambulatory Visit (INDEPENDENT_AMBULATORY_CARE_PROVIDER_SITE_OTHER): Payer: Medicare Other | Admitting: Family Medicine

## 2013-04-30 VITALS — BP 135/85 | HR 69 | Temp 97.5°F | Ht 65.5 in | Wt 170.2 lb

## 2013-04-30 DIAGNOSIS — W57XXXA Bitten or stung by nonvenomous insect and other nonvenomous arthropods, initial encounter: Secondary | ICD-10-CM | POA: Diagnosis not present

## 2013-04-30 DIAGNOSIS — T148 Other injury of unspecified body region: Secondary | ICD-10-CM

## 2013-04-30 MED ORDER — DOXYCYCLINE HYCLATE 100 MG PO TABS: 100.0000 mg | ORAL_TABLET | Freq: Two times a day (BID) | ORAL | Status: DC

## 2013-04-30 NOTE — Progress Notes (Signed)
   Subjective:    Patient ID: Cheryl Perry, female    DOB: 03/09/1945, 68 y.o.   MRN: [id number]  HPI  This 68 y.o. female presents for evaluation of insect bite and reddened area on right upper hip that Has been present for a couple days.  Review of Systems    No chest pain, SOB, HA, dizziness, vision change, N/V, diarrhea, constipation, dysuria, urinary urgency or frequency, myalgias, arthralgias or rash.  Objective:   Physical Exam Vital signs noted  Well developed well nourished female.  Skin - Erythema right upper hip with central mark consistent with injection from insect bite.       Assessment & Plan:  Insect bite - Plan: doxycycline (VIBRA-TABS) 100 MG tablet Po bid x 10 days  Lysbeth Penner FNP

## 2013-05-04 DIAGNOSIS — J309 Allergic rhinitis, unspecified: Secondary | ICD-10-CM | POA: Diagnosis not present

## 2013-05-06 DIAGNOSIS — J309 Allergic rhinitis, unspecified: Secondary | ICD-10-CM | POA: Diagnosis not present

## 2013-05-12 DIAGNOSIS — J309 Allergic rhinitis, unspecified: Secondary | ICD-10-CM | POA: Diagnosis not present

## 2013-05-19 DIAGNOSIS — J309 Allergic rhinitis, unspecified: Secondary | ICD-10-CM | POA: Diagnosis not present

## 2013-05-27 DIAGNOSIS — J309 Allergic rhinitis, unspecified: Secondary | ICD-10-CM | POA: Diagnosis not present

## 2013-06-08 DIAGNOSIS — J309 Allergic rhinitis, unspecified: Secondary | ICD-10-CM | POA: Diagnosis not present

## 2013-06-09 ENCOUNTER — Ambulatory Visit: Payer: Medicare Other | Admitting: Family Medicine

## 2013-06-09 ENCOUNTER — Encounter: Payer: Self-pay | Admitting: Family Medicine

## 2013-06-09 ENCOUNTER — Ambulatory Visit: Payer: Medicare Other | Admitting: General Practice

## 2013-06-09 ENCOUNTER — Ambulatory Visit (INDEPENDENT_AMBULATORY_CARE_PROVIDER_SITE_OTHER): Payer: Medicare Other | Admitting: Family Medicine

## 2013-06-09 VITALS — BP 117/75 | HR 57 | Temp 97.0°F | Ht 65.5 in | Wt 171.4 lb

## 2013-06-09 DIAGNOSIS — M8580 Other specified disorders of bone density and structure, unspecified site: Secondary | ICD-10-CM

## 2013-06-09 DIAGNOSIS — E785 Hyperlipidemia, unspecified: Secondary | ICD-10-CM

## 2013-06-09 DIAGNOSIS — E039 Hypothyroidism, unspecified: Secondary | ICD-10-CM | POA: Diagnosis not present

## 2013-06-09 DIAGNOSIS — R5383 Other fatigue: Secondary | ICD-10-CM

## 2013-06-09 DIAGNOSIS — Z23 Encounter for immunization: Secondary | ICD-10-CM | POA: Diagnosis not present

## 2013-06-09 DIAGNOSIS — R5381 Other malaise: Secondary | ICD-10-CM | POA: Diagnosis not present

## 2013-06-09 DIAGNOSIS — E559 Vitamin D deficiency, unspecified: Secondary | ICD-10-CM

## 2013-06-09 DIAGNOSIS — M899 Disorder of bone, unspecified: Secondary | ICD-10-CM

## 2013-06-09 DIAGNOSIS — M949 Disorder of cartilage, unspecified: Secondary | ICD-10-CM

## 2013-06-09 LAB — POCT CBC
Granulocyte percent: 62.1 %G (ref 37–80)
HCT, POC: 43.6 % (ref 37.7–47.9)
Hemoglobin: 14.5 g/dL (ref 12.2–16.2)
Lymph, poc: 1.5 (ref 0.6–3.4)
MCH, POC: 32.2 pg — AB (ref 27–31.2)
MCHC: 33.2 g/dL (ref 31.8–35.4)
MCV: 96.8 fL (ref 80–97)
MPV: 7.6 fL (ref 0–99.8)
POC Granulocyte: 3.4 (ref 2–6.9)
POC LYMPH PERCENT: 27 %L (ref 10–50)
Platelet Count, POC: 191 10*3/uL (ref 142–424)
RBC: 4.5 M/uL (ref 4.04–5.48)
RDW, POC: 13 %
WBC: 5.5 10*3/uL (ref 4.6–10.2)

## 2013-06-09 NOTE — Progress Notes (Signed)
   Subjective:    Patient ID: LAINEE LEHRMAN, female    DOB: November 20, 1945, 68 y.o.   MRN: [id number]  HPI This 68 y.o. female presents for evaluation of routine visit.  She sees allergist for asthma And GI for polyposis and IBS.  She has hypothyroidism, and hyperlipidemia.   She is going to  Get another colonoscopy this year to have a polyp removed.  She has had BMD and has osteoporosis. She has been having some arthritis.   Review of Systems    No chest pain, SOB, HA, dizziness, vision change, N/V, diarrhea, constipation, dysuria, urinary urgency or frequency, myalgias, arthralgias or rash.  Objective:   Physical Exam Vital signs noted  Well developed well nourished female.  HEENT - Head atraumatic Normocephalic                Eyes - PERRLA, Conjuctiva - clear Sclera- Clear EOMI                Ears - EAC's Wnl TM's Wnl Gross Hearing WNL                Throat - oropharanx wnl Respiratory - Lungs CTA bilateral Cardiac - RRR S1 and S2 without murmur GI - Abdomen soft Nontender and bowel sounds active x 4 Extremities - No edema. Neuro - Grossly intact.       Assessment & Plan:  Hyperlipemia - Plan: CMP14+EGFR, Lipid panel  Hypothyroid - Plan: Thyroid Panel With TSH  Vitamin D deficiency - Plan: Vit D  25 hydroxy (rtn osteoporosis monitoring)  Fatigue - Plan: POCT CBC, Vit D  25 hydroxy (rtn osteoporosis monitoring)  Osteopenia - Plan: DG Bone Density  Follow up in 6 months  Lysbeth Penner FNP

## 2013-06-09 NOTE — Patient Instructions (Signed)

## 2013-06-10 DIAGNOSIS — J309 Allergic rhinitis, unspecified: Secondary | ICD-10-CM | POA: Diagnosis not present

## 2013-06-10 DIAGNOSIS — K648 Other hemorrhoids: Secondary | ICD-10-CM | POA: Diagnosis not present

## 2013-06-10 LAB — CMP14+EGFR
ALT: 19 IU/L (ref 0–32)
AST: 23 IU/L (ref 0–40)
Albumin/Globulin Ratio: 2.5 (ref 1.1–2.5)
Albumin: 4.2 g/dL (ref 3.6–4.8)
Alkaline Phosphatase: 83 IU/L (ref 39–117)
BUN/Creatinine Ratio: 21 (ref 11–26)
BUN: 16 mg/dL (ref 8–27)
CO2: 25 mmol/L (ref 18–29)
Calcium: 9.4 mg/dL (ref 8.7–10.3)
Chloride: 104 mmol/L (ref 97–108)
Creatinine, Ser: 0.76 mg/dL (ref 0.57–1.00)
GFR calc Af Amer: 94 mL/min/{1.73_m2} (ref >59)
GFR calc non Af Amer: 81 mL/min/{1.73_m2} (ref >59)
Globulin, Total: 1.7 g/dL (ref 1.5–4.5)
Glucose: 99 mg/dL (ref 65–99)
Potassium: 4.5 mmol/L (ref 3.5–5.2)
Sodium: 142 mmol/L (ref 134–144)
Total Bilirubin: 0.2 mg/dL (ref 0.0–1.2)
Total Protein: 5.9 g/dL — ABNORMAL LOW (ref 6.0–8.5)

## 2013-06-10 LAB — LIPID PANEL
Chol/HDL Ratio: 2.3 ratio units (ref 0.0–4.4)
Cholesterol, Total: 204 mg/dL — ABNORMAL HIGH (ref 100–199)
HDL: 90 mg/dL (ref >39)
LDL Calculated: 104 mg/dL — ABNORMAL HIGH (ref 0–99)
Triglycerides: 48 mg/dL (ref 0–149)
VLDL Cholesterol Cal: 10 mg/dL (ref 5–40)

## 2013-06-10 LAB — THYROID PANEL WITH TSH
Free Thyroxine Index: 2.1 (ref 1.2–4.9)
T3 Uptake Ratio: 33 % (ref 24–39)
T4, Total: 6.5 ug/dL (ref 4.5–12.0)
TSH: 4.32 u[IU]/mL (ref 0.450–4.500)

## 2013-06-10 LAB — VITAMIN D 25 HYDROXY (VIT D DEFICIENCY, FRACTURES): Vit D, 25-Hydroxy: 35.4 ng/mL (ref 30.0–100.0)

## 2013-06-17 DIAGNOSIS — J309 Allergic rhinitis, unspecified: Secondary | ICD-10-CM | POA: Diagnosis not present

## 2013-06-22 DIAGNOSIS — J309 Allergic rhinitis, unspecified: Secondary | ICD-10-CM | POA: Diagnosis not present

## 2013-07-01 DIAGNOSIS — J309 Allergic rhinitis, unspecified: Secondary | ICD-10-CM | POA: Diagnosis not present

## 2013-07-13 DIAGNOSIS — J309 Allergic rhinitis, unspecified: Secondary | ICD-10-CM | POA: Diagnosis not present

## 2013-07-15 DIAGNOSIS — J309 Allergic rhinitis, unspecified: Secondary | ICD-10-CM | POA: Diagnosis not present

## 2013-07-24 DIAGNOSIS — J309 Allergic rhinitis, unspecified: Secondary | ICD-10-CM | POA: Diagnosis not present

## 2013-07-29 DIAGNOSIS — J309 Allergic rhinitis, unspecified: Secondary | ICD-10-CM | POA: Diagnosis not present

## 2013-07-31 ENCOUNTER — Ambulatory Visit (HOSPITAL_COMMUNITY)
Admission: RE | Admit: 2013-07-31 | Discharge: 2013-07-31 | Disposition: A | Discharge status: Home / Self Care | Payer: Medicare Other | Source: Ambulatory Visit | Attending: Family Medicine | Admitting: Family Medicine

## 2013-07-31 ENCOUNTER — Encounter: Payer: Self-pay | Admitting: Family Medicine

## 2013-07-31 ENCOUNTER — Ambulatory Visit (INDEPENDENT_AMBULATORY_CARE_PROVIDER_SITE_OTHER): Payer: Medicare Other | Admitting: Family Medicine

## 2013-07-31 ENCOUNTER — Other Ambulatory Visit (HOSPITAL_COMMUNITY): Payer: Self-pay | Admitting: Family Medicine

## 2013-07-31 VITALS — BP 109/68 | HR 67 | Temp 97.7°F | Ht 65.5 in | Wt 170.0 lb

## 2013-07-31 DIAGNOSIS — I809 Phlebitis and thrombophlebitis of unspecified site: Secondary | ICD-10-CM

## 2013-07-31 DIAGNOSIS — M79609 Pain in unspecified limb: Secondary | ICD-10-CM | POA: Diagnosis not present

## 2013-07-31 DIAGNOSIS — M25562 Pain in left knee: Secondary | ICD-10-CM

## 2013-07-31 DIAGNOSIS — M7989 Other specified soft tissue disorders: Secondary | ICD-10-CM | POA: Diagnosis not present

## 2013-07-31 DIAGNOSIS — M25569 Pain in unspecified knee: Secondary | ICD-10-CM | POA: Insufficient documentation

## 2013-07-31 MED ORDER — NAPROXEN 500 MG PO TABS: 500.0000 mg | ORAL_TABLET | Freq: Two times a day (BID) | ORAL | Status: DC

## 2013-07-31 NOTE — Progress Notes (Signed)
*  Preliminary Results* Left lower extremity venous duplex completed. Left lower extremity is negative for deep vein thrombosis. There is no evidence of left Baker's cyst.  07/31/2013 4:54 PM  Maudry Mayhew, RVT, RDCS, RDMS

## 2013-07-31 NOTE — Progress Notes (Signed)
   Subjective:    Patient ID: Cheryl Perry, female    DOB: February 06, 1945, 68 y.o.   MRN: [id number]  HPI  C/o left leg pain and distended vein in her left posterior knee area around vein is sore. She is worried because she has family hx of DVT's.   Review of Systems    No chest pain, SOB, HA, dizziness, vision change, N/V, diarrhea, constipation, dysuria, urinary urgency or frequency, myalgias, arthralgias or rash.  Objective:   Physical Exam  Vital signs noted  Well developed well nourished female.  HEENT - Head atraumatic Normocephalic Respiratory - Lungs CTA bilateral Cardiac - RRR S1 and S2 without murmur GI - Abdomen soft Nontender and bowel sounds active x 4 Extremities - Left lower extremity with distended vein posterior knee and left thigh with TTP No swelling in left lower extremity    Assessment & Plan:  Phlebitis - Plan: naproxen (NAPROSYN) 500 MG tablet, [place] Extrem Low Left Ltd Get [place] left lower extremity due to family hx of DVT's  Lysbeth Penner FNP

## 2013-08-05 DIAGNOSIS — J309 Allergic rhinitis, unspecified: Secondary | ICD-10-CM | POA: Diagnosis not present

## 2013-08-14 DIAGNOSIS — J309 Allergic rhinitis, unspecified: Secondary | ICD-10-CM | POA: Diagnosis not present

## 2013-08-28 DIAGNOSIS — J309 Allergic rhinitis, unspecified: Secondary | ICD-10-CM | POA: Diagnosis not present

## 2013-09-02 ENCOUNTER — Encounter: Payer: Self-pay | Admitting: Pharmacist

## 2013-09-02 ENCOUNTER — Ambulatory Visit (INDEPENDENT_AMBULATORY_CARE_PROVIDER_SITE_OTHER): Payer: Medicare Other

## 2013-09-02 ENCOUNTER — Ambulatory Visit (INDEPENDENT_AMBULATORY_CARE_PROVIDER_SITE_OTHER): Payer: Medicare Other | Admitting: Pharmacist

## 2013-09-02 VITALS — Ht 65.5 in | Wt 170.0 lb

## 2013-09-02 DIAGNOSIS — M8580 Other specified disorders of bone density and structure, unspecified site: Secondary | ICD-10-CM

## 2013-09-02 DIAGNOSIS — M899 Disorder of bone, unspecified: Secondary | ICD-10-CM

## 2013-09-02 DIAGNOSIS — M949 Disorder of cartilage, unspecified: Secondary | ICD-10-CM

## 2013-09-02 DIAGNOSIS — E039 Hypothyroidism, unspecified: Secondary | ICD-10-CM

## 2013-09-02 LAB — HM DEXA SCAN

## 2013-09-02 NOTE — Patient Instructions (Signed)
Fall Prevention and Home Safety Falls cause injuries and can affect all age groups. It is possible to use preventive measures to significantly decrease the likelihood of falls. There are many simple measures which can make your home safer and prevent falls. OUTDOORS  Repair cracks and edges of walkways and driveways.  Remove high doorway thresholds.  Trim shrubbery on the main path into your home.  Have good outside lighting.  Clear walkways of tools, rocks, debris, and clutter.  Check that handrails are not broken and are securely fastened. Both sides of steps should have handrails.  Have leaves, snow, and ice cleared regularly.  Use sand or salt on walkways during winter months.  In the garage, clean up grease or oil spills. BATHROOM  Install night lights.  Install grab bars by the toilet and in the tub and shower.  Use non-skid mats or decals in the tub or shower.  Place a plastic non-slip stool in the shower to sit on, if needed.  Keep floors dry and clean up all water on the floor immediately.  Remove soap buildup in the tub or shower on a regular basis.  Secure bath mats with non-slip, double-sided rug tape.  Remove throw rugs and tripping hazards from the floors. BEDROOMS  Install night lights.  Make sure a bedside light is easy to reach.  Do not use oversized bedding.  Keep a telephone by your bedside.  Have a firm chair with side arms to use for getting dressed.  Remove throw rugs and tripping hazards from the floor. KITCHEN  Keep handles on pots and pans turned toward the center of the stove. Use back burners when possible.  Clean up spills quickly and allow time for drying.  Avoid walking on wet floors.  Avoid hot utensils and knives.  Position shelves so they are not too high or low.  Place commonly used objects within easy reach.  If necessary, use a sturdy step stool with a grab bar when reaching.  Keep electrical cables out of the  way.  Do not use floor polish or wax that makes floors slippery. If you must use wax, use non-skid floor wax.  Remove throw rugs and tripping hazards from the floor. STAIRWAYS  Never leave objects on stairs.  Place handrails on both sides of stairways and use them. Fix any loose handrails. Make sure handrails on both sides of the stairways are as long as the stairs.  Check carpeting to make sure it is firmly attached along stairs. Make repairs to worn or loose carpet promptly.  Avoid placing throw rugs at the top or bottom of stairways, or properly secure the rug with carpet tape to prevent slippage. Get rid of throw rugs, if possible.  Have an electrician put in a light switch at the top and bottom of the stairs. OTHER FALL PREVENTION TIPS  Wear low-heel or rubber-soled shoes that are supportive and fit well. Wear closed toe shoes.  When using a stepladder, make sure it is fully opened and both spreaders are firmly locked. Do not climb a closed stepladder.  Add color or contrast paint or tape to grab bars and handrails in your home. Place contrasting color strips on first and last steps.  Learn and use mobility aids as needed. Install an electrical emergency response system.  Turn on lights to avoid dark areas. Replace light bulbs that burn out immediately. Get light switches that glow.  Arrange furniture to create clear pathways. Keep furniture in the same place.    Firmly attach carpet with non-skid or double-sided tape.  Eliminate uneven floor surfaces.  Select a carpet pattern that does not visually hide the edge of steps.  Be aware of all pets. OTHER HOME SAFETY TIPS  Set the water temperature for 120 F (48.8 C).  Keep emergency numbers on or near the telephone.  Keep smoke detectors on every level of the home and near sleeping areas. Document Released: 12/15/2001 Document Revised: 06/26/2011 Document Reviewed: 03/16/2011 Exit[employer] Patient Information 2015  [employer]. This information is not intended to replace advice given to you by your health care provider. Make sure you discuss any questions you have with your health care provider.                Exercise for Strong Bones  Exercise is important to build and maintain strong bones / bone density.  There are 2 types of exercises that are important to building and maintaining strong bones:  Weight- bearing and muscle-stregthening.  Weight-bearing Exercises  These exercises include activities that make you move against gravity while staying upright. Weight-bearing exercises can be high-impact or low-impact.  High-impact weight-bearing exercises help build bones and keep them strong. If you have broken a bone due to osteoporosis or are at risk of breaking a bone, you may need to avoid high-impact exercises. If you're not sure, you should check with your healthcare provider.  Examples of high-impact weight-bearing exercises are: Dancing  Doing high-impact aerobics  Hiking  Jogging/running  Jumping Rope  Stair climbing  Tennis  Low-impact weight-bearing exercises can also help keep bones strong and are a safe alternative if you cannot do high-impact exercises.   Examples of low-impact weight-bearing exercises are: Using elliptical training machines  Doing low-impact aerobics  Using stair-step machines  Fast walking on a treadmill or outside   Muscle-Strengthening Exercises These exercises include activities where you move your body, a weight or some other resistance against gravity. They are also known as resistance exercises and include: Lifting weights  Using elastic exercise bands  Using weight machines  Lifting your own body weight  Functional movements, such as standing and rising up on your toes  Yoga and Pilates can also improve strength, balance and flexibility. However, certain positions may not be safe for people with osteoporosis or those at increased risk of broken  bones. For example, exercises that have you bend forward may increase the chance of breaking a bone in the spine.   Non-Impact Exercises There are other types of exercises that can help prevent falls.  Non-impact exercises can help you to improve balance, posture and how well you move in everyday activities. Some of these exercises include: Balance exercises that strengthen your legs and test your balance, such as Tai Chi, can decrease your risk of falls.  Posture exercises that improve your posture and reduce rounded or "sloping" shoulders can help you decrease the chance of breaking a bone, especially in the spine.  Functional exercises that improve how well you move can help you with everyday activities and decrease your chance of falling and breaking a bone. For example, if you have trouble getting up from a chair or climbing stairs, you should do these activities as exercises.   **A physical therapist can teach you balance, posture and functional exercises. He/she can also help you learn which exercises are safe and appropriate for you.  [facility] has a physical therapy office in [place] in front of our office and referrals can be made for assessments   and treatment as needed and strength and balance training.  If you would like to have an assessment with [facility] and our physical therapy team please let a nurse or provider know.    

## 2013-09-02 NOTE — Progress Notes (Signed)
Patient ID: SHAKETA SERAFIN, female   DOB: [birth year], 68 y.o.   MRN: [id number] [facility] Current Height: Height: 5' 5.5" (166.4 cm)      Max Lifetime Height:  5\' 8"  Current Weight: Weight: 170 lb (77.111 kg)       Ethnicity:Caucasian     HPI:  Mrs. Westerhoff is a 68yo female here today to recheck BMD.  She does have a history of osteopenia.  She has tried evista in 2011 but discontinued because caused GI upset, which resolved when she stopped evista.  Back Pain?  Yes - herniated disc followed by Dr Nelva Bush  Kyphosis?  No Prior fracture?  No                                                              PMH: Age at menopause:  68yo Hysterectomy?  No Oophorectomy?  No HRT? Yes - Former.  Type/duration: prempro Steroid Use?  Yes - Current.  Type/duration: inhale corticsteriod for asthma Thyroid med?  Yes History of cancer?  No History of digestive disorders (ie Crohn's)?  Yes - GI spasms Current or previous eating disorders?  No Last Vitamin D Result:  35.4 (06/09/2013) Last GFR Result:  81 (06/09/2013)   FH/SH: Family history of osteoporosis?  No Parent with history of hip fracture?  No Family history of breast cancer?  Yes - paternal great aunt Exercise?  Yes - water aerobics 3x per week and walks 4x per week Smoking?  No Alcohol?  No    Calcium Assessment Calcium Intake  # of servings/day  Calcium mg  Milk (8 oz) 1  x  300  = 300 mg  Yogurt (4 oz) 2 x  200 = 400 mg  Cheese (1 oz) 0 x  200 = 0  Other Calcium sources   250mg   Ca supplement 0 = 0   Estimated calcium intake per day 950mg     DEXA Results Date of Test T-Score for AP Spine L1-L4 T-Score for Total Left Hip T-Score for Total Right Hip  09/02/2013 -0.6 -1.0 -1.5  06/27/2011 -0.4 -1.4 -1.6  09/10/2007 -0.5 -0.6 -1.0  05/01/2004 -0.2 -0.5    T-Score for neck of right femur was -1.6 today  FRAX 10 year estimate: Total FX risk:  9.8%  (consider medication if >/= 20%) Hip FX risk:  1.3%  (consider  medication if >/= 3%)  Assessment: Osteopenia with increase in neck of right femur, all other areas stable  Recommendations: 1.  Discussed fracture risk and results of BMD.   2.  recommend calcium 1200mg  daily through supplementation or diet.  3.  continue weight bearing exercise - 30 minutes at least 4 days per week.   4.  Counseled and educated about fall risk and prevention.  Recheck DEXA:  2 years  Time spent counseling patient:  20 minutes  Cherre Robins, PharmD, CPP

## 2013-09-04 DIAGNOSIS — J309 Allergic rhinitis, unspecified: Secondary | ICD-10-CM | POA: Diagnosis not present

## 2013-09-10 DIAGNOSIS — J309 Allergic rhinitis, unspecified: Secondary | ICD-10-CM | POA: Diagnosis not present

## 2013-09-18 DIAGNOSIS — J309 Allergic rhinitis, unspecified: Secondary | ICD-10-CM | POA: Diagnosis not present

## 2013-09-21 DIAGNOSIS — M542 Cervicalgia: Secondary | ICD-10-CM | POA: Diagnosis not present

## 2013-09-21 DIAGNOSIS — G5600 Carpal tunnel syndrome, unspecified upper limb: Secondary | ICD-10-CM | POA: Diagnosis not present

## 2013-09-21 DIAGNOSIS — M5137 Other intervertebral disc degeneration, lumbosacral region: Secondary | ICD-10-CM | POA: Diagnosis not present

## 2013-09-21 DIAGNOSIS — M6281 Muscle weakness (generalized): Secondary | ICD-10-CM | POA: Diagnosis not present

## 2013-09-30 DIAGNOSIS — J309 Allergic rhinitis, unspecified: Secondary | ICD-10-CM | POA: Diagnosis not present

## 2013-10-14 DIAGNOSIS — J3089 Other allergic rhinitis: Secondary | ICD-10-CM | POA: Diagnosis not present

## 2013-10-14 DIAGNOSIS — J301 Allergic rhinitis due to pollen: Secondary | ICD-10-CM | POA: Diagnosis not present

## 2013-10-16 DIAGNOSIS — J301 Allergic rhinitis due to pollen: Secondary | ICD-10-CM | POA: Diagnosis not present

## 2013-10-16 DIAGNOSIS — J3089 Other allergic rhinitis: Secondary | ICD-10-CM | POA: Diagnosis not present

## 2013-10-16 DIAGNOSIS — J4540 Moderate persistent asthma, uncomplicated: Secondary | ICD-10-CM | POA: Diagnosis not present

## 2013-10-20 DIAGNOSIS — M1812 Unilateral primary osteoarthritis of first carpometacarpal joint, left hand: Secondary | ICD-10-CM | POA: Diagnosis not present

## 2013-10-20 DIAGNOSIS — G5602 Carpal tunnel syndrome, left upper limb: Secondary | ICD-10-CM | POA: Diagnosis not present

## 2013-10-22 DIAGNOSIS — R05 Cough: Secondary | ICD-10-CM | POA: Diagnosis not present

## 2013-10-22 DIAGNOSIS — J3089 Other allergic rhinitis: Secondary | ICD-10-CM | POA: Diagnosis not present

## 2013-10-22 DIAGNOSIS — J4540 Moderate persistent asthma, uncomplicated: Secondary | ICD-10-CM | POA: Diagnosis not present

## 2013-10-22 DIAGNOSIS — J3081 Allergic rhinitis due to animal (cat) (dog) hair and dander: Secondary | ICD-10-CM | POA: Diagnosis not present

## 2013-10-22 DIAGNOSIS — J301 Allergic rhinitis due to pollen: Secondary | ICD-10-CM | POA: Diagnosis not present

## 2013-10-23 ENCOUNTER — Other Ambulatory Visit: Payer: Self-pay

## 2013-10-27 DIAGNOSIS — J3089 Other allergic rhinitis: Secondary | ICD-10-CM | POA: Diagnosis not present

## 2013-10-27 DIAGNOSIS — J301 Allergic rhinitis due to pollen: Secondary | ICD-10-CM | POA: Diagnosis not present

## 2013-10-27 DIAGNOSIS — J3081 Allergic rhinitis due to animal (cat) (dog) hair and dander: Secondary | ICD-10-CM | POA: Diagnosis not present

## 2013-10-30 DIAGNOSIS — J301 Allergic rhinitis due to pollen: Secondary | ICD-10-CM | POA: Diagnosis not present

## 2013-10-30 DIAGNOSIS — J3081 Allergic rhinitis due to animal (cat) (dog) hair and dander: Secondary | ICD-10-CM | POA: Diagnosis not present

## 2013-11-06 DIAGNOSIS — J301 Allergic rhinitis due to pollen: Secondary | ICD-10-CM | POA: Diagnosis not present

## 2013-11-06 DIAGNOSIS — J3089 Other allergic rhinitis: Secondary | ICD-10-CM | POA: Diagnosis not present

## 2013-11-13 DIAGNOSIS — J3089 Other allergic rhinitis: Secondary | ICD-10-CM | POA: Diagnosis not present

## 2013-11-13 DIAGNOSIS — J301 Allergic rhinitis due to pollen: Secondary | ICD-10-CM | POA: Diagnosis not present

## 2013-11-13 DIAGNOSIS — J3081 Allergic rhinitis due to animal (cat) (dog) hair and dander: Secondary | ICD-10-CM | POA: Diagnosis not present

## 2013-11-17 DIAGNOSIS — J3089 Other allergic rhinitis: Secondary | ICD-10-CM | POA: Diagnosis not present

## 2013-11-20 DIAGNOSIS — J3081 Allergic rhinitis due to animal (cat) (dog) hair and dander: Secondary | ICD-10-CM | POA: Diagnosis not present

## 2013-11-20 DIAGNOSIS — J301 Allergic rhinitis due to pollen: Secondary | ICD-10-CM | POA: Diagnosis not present

## 2013-11-20 DIAGNOSIS — J3089 Other allergic rhinitis: Secondary | ICD-10-CM | POA: Diagnosis not present

## 2013-11-25 DIAGNOSIS — J3081 Allergic rhinitis due to animal (cat) (dog) hair and dander: Secondary | ICD-10-CM | POA: Diagnosis not present

## 2013-11-25 DIAGNOSIS — J3089 Other allergic rhinitis: Secondary | ICD-10-CM | POA: Diagnosis not present

## 2013-11-25 DIAGNOSIS — J301 Allergic rhinitis due to pollen: Secondary | ICD-10-CM | POA: Diagnosis not present

## 2013-12-01 DIAGNOSIS — M1812 Unilateral primary osteoarthritis of first carpometacarpal joint, left hand: Secondary | ICD-10-CM | POA: Diagnosis not present

## 2013-12-01 DIAGNOSIS — J301 Allergic rhinitis due to pollen: Secondary | ICD-10-CM | POA: Diagnosis not present

## 2013-12-01 DIAGNOSIS — J3081 Allergic rhinitis due to animal (cat) (dog) hair and dander: Secondary | ICD-10-CM | POA: Diagnosis not present

## 2013-12-01 DIAGNOSIS — J3089 Other allergic rhinitis: Secondary | ICD-10-CM | POA: Diagnosis not present

## 2013-12-01 DIAGNOSIS — G5602 Carpal tunnel syndrome, left upper limb: Secondary | ICD-10-CM | POA: Diagnosis not present

## 2013-12-09 ENCOUNTER — Ambulatory Visit (INDEPENDENT_AMBULATORY_CARE_PROVIDER_SITE_OTHER): Payer: Medicare Other | Admitting: Family Medicine

## 2013-12-09 ENCOUNTER — Encounter: Payer: Self-pay | Admitting: Family Medicine

## 2013-12-09 VITALS — BP 111/69 | HR 65 | Temp 97.3°F | Ht 65.5 in | Wt 170.8 lb

## 2013-12-09 DIAGNOSIS — E785 Hyperlipidemia, unspecified: Secondary | ICD-10-CM | POA: Diagnosis not present

## 2013-12-09 DIAGNOSIS — Z23 Encounter for immunization: Secondary | ICD-10-CM

## 2013-12-09 DIAGNOSIS — E038 Other specified hypothyroidism: Secondary | ICD-10-CM

## 2013-12-09 LAB — POCT CBC
Granulocyte percent: 64.2 %G (ref 37–80)
HCT, POC: 42.5 % (ref 37.7–47.9)
Hemoglobin: 14.2 g/dL (ref 12.2–16.2)
Lymph, poc: 1.5 (ref 0.6–3.4)
MCH, POC: 32.7 pg — AB (ref 27–31.2)
MCHC: 33.4 g/dL (ref 31.8–35.4)
MCV: 97.8 fL — AB (ref 80–97)
MPV: 6.9 fL (ref 0–99.8)
POC Granulocyte: 3.7 (ref 2–6.9)
POC LYMPH PERCENT: 26 %L (ref 10–50)
Platelet Count, POC: 205 10*3/uL (ref 142–424)
RBC: 4.4 M/uL (ref 4.04–5.48)
RDW, POC: 12.6 %
WBC: 5.8 10*3/uL (ref 4.6–10.2)

## 2013-12-09 MED ORDER — NIACIN-LOVASTATIN ER 1000-40 MG PO TB24: 1.0000 | ORAL_TABLET | Freq: Every day | ORAL | Status: DC

## 2013-12-09 MED ORDER — LEVOTHYROXINE SODIUM 75 MCG PO TABS: ORAL_TABLET | ORAL | Status: DC

## 2013-12-09 NOTE — Progress Notes (Signed)
   Subjective:    Patient ID: Cheryl Perry, female    DOB: Oct 23, 1945, 68 y.o.   MRN: [id number]  HPI Paitent is here for f/u  Review of Systems  Constitutional: Negative for fever.  HENT: Negative for ear pain.   Eyes: Negative for discharge.  Respiratory: Negative for cough.   Cardiovascular: Negative for chest pain.  Gastrointestinal: Negative for abdominal distention.  Endocrine: Negative for polyuria.  Genitourinary: Negative for difficulty urinating.  Musculoskeletal: Negative for gait problem and neck pain.  Skin: Negative for color change and rash.  Neurological: Negative for speech difficulty and headaches.  Psychiatric/Behavioral: Negative for agitation.       Objective:    BP 111/69 mmHg  Pulse 65  Temp(Src) 97.3 F (36.3 C) (Oral)  Ht 5' 5.5" (1.664 m)  Wt 170 lb 12.8 oz (77.474 kg)  BMI 27.98 kg/m2 Physical Exam  Constitutional: She is oriented to person, place, and time. She appears well-developed and well-nourished.  HENT:  Head: Normocephalic and atraumatic.  Mouth/Throat: Oropharynx is clear and moist.  Eyes: Pupils are equal, round, and reactive to light.  Neck: Normal range of motion. Neck supple.  Cardiovascular: Normal rate and regular rhythm.   No murmur heard. Pulmonary/Chest: Effort normal and breath sounds normal.  Abdominal: Soft. Bowel sounds are normal. There is no tenderness.  Neurological: She is alert and oriented to person, place, and time.  Skin: Skin is warm and dry.  Psychiatric: She has a normal mood and affect.          Assessment & Plan:     ICD-9-CM ICD-10-CM   1. HLD (hyperlipidemia) 272.4 E78.5 Niacin-Lovastatin (ADVICOR) 1000-40 MG TB24     POCT CBC     Lipid panel     CMP14+EGFR  2. Other specified hypothyroidism 244.8 E03.8 levothyroxine (SYNTHROID, LEVOTHROID) 75 MCG tablet     TSH     No Follow-up on file.  Lysbeth Penner FNP

## 2013-12-09 NOTE — Addendum Note (Signed)
Addended by: Marin Olp on: 12/09/2013 06:49 PM   Modules accepted: Miquel Dunn

## 2013-12-10 LAB — CMP14+EGFR
ALT: 17 IU/L (ref 0–32)
AST: 20 IU/L (ref 0–40)
Albumin/Globulin Ratio: 2.2 (ref 1.1–2.5)
Albumin: 4 g/dL (ref 3.6–4.8)
Alkaline Phosphatase: 65 IU/L (ref 39–117)
BUN/Creatinine Ratio: 20 (ref 11–26)
BUN: 15 mg/dL (ref 8–27)
CO2: 24 mmol/L (ref 18–29)
Calcium: 9.4 mg/dL (ref 8.7–10.3)
Chloride: 101 mmol/L (ref 97–108)
Creatinine, Ser: 0.75 mg/dL (ref 0.57–1.00)
GFR calc Af Amer: 95 mL/min/{1.73_m2} (ref >59)
GFR calc non Af Amer: 82 mL/min/{1.73_m2} (ref >59)
Globulin, Total: 1.8 g/dL (ref 1.5–4.5)
Glucose: 87 mg/dL (ref 65–99)
Potassium: 4.3 mmol/L (ref 3.5–5.2)
Sodium: 140 mmol/L (ref 134–144)
Total Bilirubin: 0.3 mg/dL (ref 0.0–1.2)
Total Protein: 5.8 g/dL — ABNORMAL LOW (ref 6.0–8.5)

## 2013-12-10 LAB — LIPID PANEL
Chol/HDL Ratio: 2.2 ratio units (ref 0.0–4.4)
Cholesterol, Total: 223 mg/dL — ABNORMAL HIGH (ref 100–199)
HDL: 102 mg/dL (ref >39)
LDL Calculated: 110 mg/dL — ABNORMAL HIGH (ref 0–99)
Triglycerides: 57 mg/dL (ref 0–149)
VLDL Cholesterol Cal: 11 mg/dL (ref 5–40)

## 2013-12-10 LAB — TSH: TSH: 2.51 u[IU]/mL (ref 0.450–4.500)

## 2013-12-16 DIAGNOSIS — J3081 Allergic rhinitis due to animal (cat) (dog) hair and dander: Secondary | ICD-10-CM | POA: Diagnosis not present

## 2013-12-16 DIAGNOSIS — J3089 Other allergic rhinitis: Secondary | ICD-10-CM | POA: Diagnosis not present

## 2013-12-16 DIAGNOSIS — J301 Allergic rhinitis due to pollen: Secondary | ICD-10-CM | POA: Diagnosis not present

## 2013-12-29 DIAGNOSIS — J3081 Allergic rhinitis due to animal (cat) (dog) hair and dander: Secondary | ICD-10-CM | POA: Diagnosis not present

## 2013-12-29 DIAGNOSIS — J3089 Other allergic rhinitis: Secondary | ICD-10-CM | POA: Diagnosis not present

## 2013-12-29 DIAGNOSIS — J301 Allergic rhinitis due to pollen: Secondary | ICD-10-CM | POA: Diagnosis not present

## 2013-12-29 DIAGNOSIS — M1812 Unilateral primary osteoarthritis of first carpometacarpal joint, left hand: Secondary | ICD-10-CM | POA: Diagnosis not present

## 2014-01-06 DIAGNOSIS — J301 Allergic rhinitis due to pollen: Secondary | ICD-10-CM | POA: Diagnosis not present

## 2014-01-06 DIAGNOSIS — J3089 Other allergic rhinitis: Secondary | ICD-10-CM | POA: Diagnosis not present

## 2014-01-06 DIAGNOSIS — J3081 Allergic rhinitis due to animal (cat) (dog) hair and dander: Secondary | ICD-10-CM | POA: Diagnosis not present

## 2014-01-13 DIAGNOSIS — J3081 Allergic rhinitis due to animal (cat) (dog) hair and dander: Secondary | ICD-10-CM | POA: Diagnosis not present

## 2014-01-13 DIAGNOSIS — J3089 Other allergic rhinitis: Secondary | ICD-10-CM | POA: Diagnosis not present

## 2014-01-13 DIAGNOSIS — J301 Allergic rhinitis due to pollen: Secondary | ICD-10-CM | POA: Diagnosis not present

## 2014-01-27 DIAGNOSIS — J3081 Allergic rhinitis due to animal (cat) (dog) hair and dander: Secondary | ICD-10-CM | POA: Diagnosis not present

## 2014-01-27 DIAGNOSIS — J301 Allergic rhinitis due to pollen: Secondary | ICD-10-CM | POA: Diagnosis not present

## 2014-01-27 DIAGNOSIS — J3089 Other allergic rhinitis: Secondary | ICD-10-CM | POA: Diagnosis not present

## 2014-02-11 DIAGNOSIS — J3081 Allergic rhinitis due to animal (cat) (dog) hair and dander: Secondary | ICD-10-CM | POA: Diagnosis not present

## 2014-02-11 DIAGNOSIS — J3089 Other allergic rhinitis: Secondary | ICD-10-CM | POA: Diagnosis not present

## 2014-02-11 DIAGNOSIS — J301 Allergic rhinitis due to pollen: Secondary | ICD-10-CM | POA: Diagnosis not present

## 2014-02-19 DIAGNOSIS — J3081 Allergic rhinitis due to animal (cat) (dog) hair and dander: Secondary | ICD-10-CM | POA: Diagnosis not present

## 2014-02-19 DIAGNOSIS — J3089 Other allergic rhinitis: Secondary | ICD-10-CM | POA: Diagnosis not present

## 2014-02-19 DIAGNOSIS — J301 Allergic rhinitis due to pollen: Secondary | ICD-10-CM | POA: Diagnosis not present

## 2014-03-05 DIAGNOSIS — J3089 Other allergic rhinitis: Secondary | ICD-10-CM | POA: Diagnosis not present

## 2014-03-05 DIAGNOSIS — J3081 Allergic rhinitis due to animal (cat) (dog) hair and dander: Secondary | ICD-10-CM | POA: Diagnosis not present

## 2014-03-05 DIAGNOSIS — H04123 Dry eye syndrome of bilateral lacrimal glands: Secondary | ICD-10-CM | POA: Diagnosis not present

## 2014-03-05 DIAGNOSIS — J301 Allergic rhinitis due to pollen: Secondary | ICD-10-CM | POA: Diagnosis not present

## 2014-03-05 DIAGNOSIS — H43811 Vitreous degeneration, right eye: Secondary | ICD-10-CM | POA: Diagnosis not present

## 2014-03-05 DIAGNOSIS — H25013 Cortical age-related cataract, bilateral: Secondary | ICD-10-CM | POA: Diagnosis not present

## 2014-03-09 DIAGNOSIS — J3081 Allergic rhinitis due to animal (cat) (dog) hair and dander: Secondary | ICD-10-CM | POA: Diagnosis not present

## 2014-03-09 DIAGNOSIS — J3089 Other allergic rhinitis: Secondary | ICD-10-CM | POA: Diagnosis not present

## 2014-03-09 DIAGNOSIS — J301 Allergic rhinitis due to pollen: Secondary | ICD-10-CM | POA: Diagnosis not present

## 2014-03-15 DIAGNOSIS — J3081 Allergic rhinitis due to animal (cat) (dog) hair and dander: Secondary | ICD-10-CM | POA: Diagnosis not present

## 2014-03-15 DIAGNOSIS — J301 Allergic rhinitis due to pollen: Secondary | ICD-10-CM | POA: Diagnosis not present

## 2014-03-15 DIAGNOSIS — J3089 Other allergic rhinitis: Secondary | ICD-10-CM | POA: Diagnosis not present

## 2014-03-26 DIAGNOSIS — J3081 Allergic rhinitis due to animal (cat) (dog) hair and dander: Secondary | ICD-10-CM | POA: Diagnosis not present

## 2014-03-26 DIAGNOSIS — J301 Allergic rhinitis due to pollen: Secondary | ICD-10-CM | POA: Diagnosis not present

## 2014-03-26 DIAGNOSIS — J3089 Other allergic rhinitis: Secondary | ICD-10-CM | POA: Diagnosis not present

## 2014-04-01 DIAGNOSIS — J3089 Other allergic rhinitis: Secondary | ICD-10-CM | POA: Diagnosis not present

## 2014-04-01 DIAGNOSIS — J3081 Allergic rhinitis due to animal (cat) (dog) hair and dander: Secondary | ICD-10-CM | POA: Diagnosis not present

## 2014-04-01 DIAGNOSIS — J301 Allergic rhinitis due to pollen: Secondary | ICD-10-CM | POA: Diagnosis not present

## 2014-04-08 DIAGNOSIS — J3081 Allergic rhinitis due to animal (cat) (dog) hair and dander: Secondary | ICD-10-CM | POA: Diagnosis not present

## 2014-04-08 DIAGNOSIS — J301 Allergic rhinitis due to pollen: Secondary | ICD-10-CM | POA: Diagnosis not present

## 2014-04-08 DIAGNOSIS — J3089 Other allergic rhinitis: Secondary | ICD-10-CM | POA: Diagnosis not present

## 2014-04-12 DIAGNOSIS — Z1231 Encounter for screening mammogram for malignant neoplasm of breast: Secondary | ICD-10-CM | POA: Diagnosis not present

## 2014-04-19 DIAGNOSIS — J301 Allergic rhinitis due to pollen: Secondary | ICD-10-CM | POA: Diagnosis not present

## 2014-04-19 DIAGNOSIS — J3081 Allergic rhinitis due to animal (cat) (dog) hair and dander: Secondary | ICD-10-CM | POA: Diagnosis not present

## 2014-04-21 DIAGNOSIS — J3081 Allergic rhinitis due to animal (cat) (dog) hair and dander: Secondary | ICD-10-CM | POA: Diagnosis not present

## 2014-04-21 DIAGNOSIS — J301 Allergic rhinitis due to pollen: Secondary | ICD-10-CM | POA: Diagnosis not present

## 2014-04-27 DIAGNOSIS — J3081 Allergic rhinitis due to animal (cat) (dog) hair and dander: Secondary | ICD-10-CM | POA: Diagnosis not present

## 2014-04-27 DIAGNOSIS — J301 Allergic rhinitis due to pollen: Secondary | ICD-10-CM | POA: Diagnosis not present

## 2014-04-27 DIAGNOSIS — J3089 Other allergic rhinitis: Secondary | ICD-10-CM | POA: Diagnosis not present

## 2014-05-05 DIAGNOSIS — J3089 Other allergic rhinitis: Secondary | ICD-10-CM | POA: Diagnosis not present

## 2014-05-05 DIAGNOSIS — J301 Allergic rhinitis due to pollen: Secondary | ICD-10-CM | POA: Diagnosis not present

## 2014-05-07 DIAGNOSIS — J3089 Other allergic rhinitis: Secondary | ICD-10-CM | POA: Diagnosis not present

## 2014-05-07 DIAGNOSIS — J301 Allergic rhinitis due to pollen: Secondary | ICD-10-CM | POA: Diagnosis not present

## 2014-05-07 DIAGNOSIS — J3081 Allergic rhinitis due to animal (cat) (dog) hair and dander: Secondary | ICD-10-CM | POA: Diagnosis not present

## 2014-05-20 DIAGNOSIS — J3081 Allergic rhinitis due to animal (cat) (dog) hair and dander: Secondary | ICD-10-CM | POA: Diagnosis not present

## 2014-05-20 DIAGNOSIS — J301 Allergic rhinitis due to pollen: Secondary | ICD-10-CM | POA: Diagnosis not present

## 2014-05-20 DIAGNOSIS — J3089 Other allergic rhinitis: Secondary | ICD-10-CM | POA: Diagnosis not present

## 2014-05-21 ENCOUNTER — Encounter: Payer: Self-pay | Admitting: Family Medicine

## 2014-05-24 DIAGNOSIS — J3089 Other allergic rhinitis: Secondary | ICD-10-CM | POA: Diagnosis not present

## 2014-05-24 DIAGNOSIS — J301 Allergic rhinitis due to pollen: Secondary | ICD-10-CM | POA: Diagnosis not present

## 2014-05-24 DIAGNOSIS — J3081 Allergic rhinitis due to animal (cat) (dog) hair and dander: Secondary | ICD-10-CM | POA: Diagnosis not present

## 2014-06-03 DIAGNOSIS — J301 Allergic rhinitis due to pollen: Secondary | ICD-10-CM | POA: Diagnosis not present

## 2014-06-03 DIAGNOSIS — J3089 Other allergic rhinitis: Secondary | ICD-10-CM | POA: Diagnosis not present

## 2014-06-03 DIAGNOSIS — J3081 Allergic rhinitis due to animal (cat) (dog) hair and dander: Secondary | ICD-10-CM | POA: Diagnosis not present

## 2014-06-15 ENCOUNTER — Ambulatory Visit (INDEPENDENT_AMBULATORY_CARE_PROVIDER_SITE_OTHER): Payer: Medicare Other | Admitting: Family

## 2014-06-15 ENCOUNTER — Encounter: Payer: Self-pay | Admitting: Family

## 2014-06-15 VITALS — BP 123/79 | HR 60 | Temp 97.5°F | Ht 65.5 in | Wt 169.0 lb

## 2014-06-15 DIAGNOSIS — J4530 Mild persistent asthma, uncomplicated: Secondary | ICD-10-CM | POA: Diagnosis not present

## 2014-06-15 DIAGNOSIS — E559 Vitamin D deficiency, unspecified: Secondary | ICD-10-CM | POA: Diagnosis not present

## 2014-06-15 DIAGNOSIS — E038 Other specified hypothyroidism: Secondary | ICD-10-CM | POA: Diagnosis not present

## 2014-06-15 DIAGNOSIS — E785 Hyperlipidemia, unspecified: Secondary | ICD-10-CM | POA: Diagnosis not present

## 2014-06-15 DIAGNOSIS — E039 Hypothyroidism, unspecified: Secondary | ICD-10-CM | POA: Diagnosis not present

## 2014-06-15 DIAGNOSIS — K219 Gastro-esophageal reflux disease without esophagitis: Secondary | ICD-10-CM | POA: Diagnosis not present

## 2014-06-15 MED ORDER — LEVOTHYROXINE SODIUM 75 MCG PO TABS: ORAL_TABLET | ORAL | Status: DC

## 2014-06-15 MED ORDER — OMEPRAZOLE 20 MG PO CPDR: 20.0000 mg | DELAYED_RELEASE_CAPSULE | Freq: Every day | ORAL | Status: DC

## 2014-06-15 MED ORDER — NIACIN-LOVASTATIN ER 1000-40 MG PO TB24: 1.0000 | ORAL_TABLET | Freq: Every day | ORAL | Status: DC

## 2014-06-15 NOTE — Addendum Note (Signed)
Addended by: Earlene Plater on: 06/15/2014 08:52 AM   Modules accepted: Miquel Dunn

## 2014-06-15 NOTE — Progress Notes (Signed)
Subjective:    Patient ID: Cheryl Perry, female    DOB: May 31, 1945, 69 y.o.   MRN: [id number]  Hyperlipidemia This is a chronic problem. The current episode started more than 1 year ago. The problem is controlled. Recent lipid tests were reviewed and are normal. Exacerbating diseases include hypothyroidism. She has no history of diabetes. Pertinent negatives include no leg pain, myalgias or shortness of breath. Current antihyperlipidemic treatment includes statins and nicotinic acid. The current treatment provides significant improvement of lipids. Risk factors for coronary artery disease include dyslipidemia, obesity, post-menopausal, a sedentary lifestyle and family history.  Thyroid Problem Presents for follow-up visit. Symptoms include dry skin and hair loss. Patient reports no anxiety, constipation, heat intolerance, hoarse voice, leg swelling, nail problem, palpitations or visual change. The symptoms have been stable. Past treatments include levothyroxine. The treatment provided significant relief. Her past medical history is significant for hyperlipidemia. There is no history of diabetes.  Gastrophageal Reflux She reports no belching, no coughing, no heartburn, no hoarse voice or no sore throat. This is a chronic problem. The current episode started more than 1 year ago. The problem occurs rarely. The problem has been resolved. The symptoms are aggravated by certain foods. She has tried a PPI for the symptoms. The treatment provided significant relief.  Asthma There is no cough, hoarse voice or shortness of breath. This is a chronic problem. The current episode started more than 1 year ago. The problem occurs intermittently. The problem has been rapidly improving. Pertinent negatives include no headaches, heartburn, myalgias or sore throat. Her symptoms are alleviated by leukotriene antagonist, rest and beta-agonist. She reports significant improvement on treatment. Her past medical history is  significant for asthma.      Review of Systems  Constitutional: Negative.   HENT: Negative.  Negative for hoarse voice and sore throat.   Eyes: Negative.   Respiratory: Negative.  Negative for cough and shortness of breath.   Cardiovascular: Negative.  Negative for palpitations.  Gastrointestinal: Negative.  Negative for heartburn and constipation.  Endocrine: Negative.  Negative for heat intolerance.  Genitourinary: Negative.   Musculoskeletal: Negative.  Negative for myalgias.  Neurological: Negative.  Negative for headaches.  Hematological: Negative.   Psychiatric/Behavioral: Negative.  The patient is not nervous/anxious.   All other systems reviewed and are negative.      Objective:   Physical Exam  Constitutional: She is oriented to person, place, and time. She appears well-developed and well-nourished. No distress.  HENT:  Head: Normocephalic and atraumatic.  Right Ear: External ear normal.  Left Ear: External ear normal.  Nose: Nose normal.  Mouth/Throat: Oropharynx is clear and moist.  Eyes: Pupils are equal, round, and reactive to light.  Neck: Normal range of motion. Neck supple. No thyromegaly present.  Cardiovascular: Normal rate, regular rhythm, normal heart sounds and intact distal pulses.   No murmur heard. Pulmonary/Chest: Effort normal and breath sounds normal. No respiratory distress. She has no wheezes.  Abdominal: Soft. Bowel sounds are normal. She exhibits no distension. There is no tenderness.  Musculoskeletal: Normal range of motion. She exhibits no edema or tenderness.  Neurological: She is alert and oriented to person, place, and time. She has normal reflexes. No cranial nerve deficit.  Skin: Skin is warm and dry.  Mole on pt's right thigh with some slight discoloration- Pt advised to have removed   Psychiatric: She has a normal mood and affect. Her behavior is normal. Judgment and thought content normal.  Vitals reviewed.  BP 123/79 mmHg   Pulse 60  Temp(Src) 97.5 F (36.4 C) (Oral)  Ht 5' 5.5" (1.664 m)  Wt 169 lb (76.658 kg)  BMI 27.69 kg/m2       Assessment & Plan:  1. Asthma, mild persistent, uncomplicated - RVU02+BXID  2. Hypothyroidism, unspecified hypothyroidism type - CMP14+EGFR - Thyroid Panel With TSH  3. HLD (hyperlipidemia) - CMP14+EGFR - Lipid panel - Niacin-Lovastatin (ADVICOR) 1000-40 MG TB24; Take 1 tablet by mouth at bedtime.  Dispense: 90 each; Refill: 3  4. Vitamin D deficiency - CMP14+EGFR - Vit D  25 hydroxy (rtn osteoporosis monitoring)  5. Gastroesophageal reflux disease, esophagitis presence not specified - CMP14+EGFR - omeprazole (PRILOSEC) 20 MG capsule; Take 1 capsule (20 mg total) by mouth daily.  Dispense: 90 capsule; Refill: 3  6. Other specified hypothyroidism - CMP14+EGFR - levothyroxine (SYNTHROID, LEVOTHROID) 75 MCG tablet; TAKE 1 TABLET (75 MCG TOTAL)  BY MOUTH DAILY BEFORE BREAKFAST.  Dispense: 90 tablet; Refill: 3   Continue all meds Labs pending Health Maintenance reviewed Diet and exercise encouraged RTO 6 months  [name], FNP

## 2014-06-15 NOTE — Patient Instructions (Signed)

## 2014-06-16 ENCOUNTER — Other Ambulatory Visit: Payer: Self-pay | Admitting: Family

## 2014-06-16 ENCOUNTER — Telehealth: Payer: Self-pay | Admitting: *Deleted

## 2014-06-16 LAB — CMP14+EGFR
A/G RATIO: 2.3 (ref 1.1–2.5)
ALT: 16 IU/L (ref 0–32)
AST: 16 IU/L (ref 0–40)
Albumin: 4.1 g/dL (ref 3.6–4.8)
Alkaline Phosphatase: 74 IU/L (ref 39–117)
BILIRUBIN TOTAL: 0.3 mg/dL (ref 0.0–1.2)
BUN/Creatinine Ratio: 19 (ref 11–26)
BUN: 15 mg/dL (ref 8–27)
CO2: 25 mmol/L (ref 18–29)
CREATININE: 0.77 mg/dL (ref 0.57–1.00)
Calcium: 9.1 mg/dL (ref 8.7–10.3)
Chloride: 105 mmol/L (ref 97–108)
GFR calc non Af Amer: 80 mL/min/{1.73_m2} (ref >59)
GFR, EST AFRICAN AMERICAN: 92 mL/min/{1.73_m2} (ref >59)
GLOBULIN, TOTAL: 1.8 g/dL (ref 1.5–4.5)
Glucose: 86 mg/dL (ref 65–99)
POTASSIUM: 4.6 mmol/L (ref 3.5–5.2)
Sodium: 143 mmol/L (ref 134–144)
Total Protein: 5.9 g/dL — ABNORMAL LOW (ref 6.0–8.5)

## 2014-06-16 LAB — LIPID PANEL
CHOL/HDL RATIO: 2.9 ratio (ref 0.0–4.4)
CHOLESTEROL TOTAL: 237 mg/dL — AB (ref 100–199)
HDL: 83 mg/dL (ref >39)
LDL CALC: 142 mg/dL — AB (ref 0–99)
Triglycerides: 62 mg/dL (ref 0–149)
VLDL CHOLESTEROL CAL: 12 mg/dL (ref 5–40)

## 2014-06-16 LAB — THYROID PANEL WITH TSH
Free Thyroxine Index: 2.1 (ref 1.2–4.9)
T3 Uptake Ratio: 29 % (ref 24–39)
T4 TOTAL: 7.3 ug/dL (ref 4.5–12.0)
TSH: 1.61 u[IU]/mL (ref 0.450–4.500)

## 2014-06-16 LAB — VITAMIN D 25 HYDROXY (VIT D DEFICIENCY, FRACTURES): Vit D, 25-Hydroxy: 33.8 ng/mL (ref 30.0–100.0)

## 2014-06-16 NOTE — Telephone Encounter (Signed)
-----   Message from Sharion Balloon, [place] sent at 06/16/2014  9:02 AM EDT ----- Kidney and liver function stable Cholesterol levels elevated- Pt needs to continue cholesterol meds and low fat diet- If continues to be elevated will need to change medication Thyroid levels WNL Vit D levels WNL

## 2014-06-23 DIAGNOSIS — J301 Allergic rhinitis due to pollen: Secondary | ICD-10-CM | POA: Diagnosis not present

## 2014-06-23 DIAGNOSIS — J3089 Other allergic rhinitis: Secondary | ICD-10-CM | POA: Diagnosis not present

## 2014-06-23 DIAGNOSIS — J3081 Allergic rhinitis due to animal (cat) (dog) hair and dander: Secondary | ICD-10-CM | POA: Diagnosis not present

## 2014-06-28 DIAGNOSIS — J3089 Other allergic rhinitis: Secondary | ICD-10-CM | POA: Diagnosis not present

## 2014-06-28 DIAGNOSIS — J301 Allergic rhinitis due to pollen: Secondary | ICD-10-CM | POA: Diagnosis not present

## 2014-06-29 DIAGNOSIS — J3089 Other allergic rhinitis: Secondary | ICD-10-CM | POA: Diagnosis not present

## 2014-07-08 ENCOUNTER — Ambulatory Visit (INDEPENDENT_AMBULATORY_CARE_PROVIDER_SITE_OTHER): Payer: Medicare Other | Admitting: Physician Assistant

## 2014-07-08 ENCOUNTER — Encounter: Payer: Self-pay | Admitting: Physician Assistant

## 2014-07-08 VITALS — BP 135/80 | HR 67 | Temp 97.7°F | Ht 65.5 in | Wt 170.0 lb

## 2014-07-08 DIAGNOSIS — L821 Other seborrheic keratosis: Secondary | ICD-10-CM | POA: Diagnosis not present

## 2014-07-08 DIAGNOSIS — D485 Neoplasm of uncertain behavior of skin: Secondary | ICD-10-CM

## 2014-07-08 NOTE — Progress Notes (Signed)
   Subjective:    Patient ID: Cheryl Perry, female    DOB: May 15, 1945, 69 y.o.   MRN: [id number]  HPI 69 y/o female presents for new and enlarging lesion on right leg. No h/o skin CA. Fair skin     Review of Systems  Skin:       Owens Shark, enlarging lesion on right thigh        Objective:   Physical Exam  Skin:  Approximately 1cm hyperkeratotic dichromic lesion on right anterior thigh           Assessment & Plan:  1. Neoplasm of uncertain behavior of skin 1.1-1.5cm lesion biopsied via shave on right anterior thigh to r/o dysplasia. Await path and notify patient accordingly Other benign appearing lesions of angiomas and seborrheic keratosis noted on exam but required no treatment. Benign epidermoid cyst on left upper/mid back.   I suggest yearly skin checks    4}  Karletta Millay A. Benjamin Stain PA-C

## 2014-07-09 DIAGNOSIS — J3089 Other allergic rhinitis: Secondary | ICD-10-CM | POA: Diagnosis not present

## 2014-07-09 DIAGNOSIS — J3081 Allergic rhinitis due to animal (cat) (dog) hair and dander: Secondary | ICD-10-CM | POA: Diagnosis not present

## 2014-07-09 DIAGNOSIS — J301 Allergic rhinitis due to pollen: Secondary | ICD-10-CM | POA: Diagnosis not present

## 2014-07-14 LAB — PATHOLOGY

## 2014-07-16 DIAGNOSIS — J301 Allergic rhinitis due to pollen: Secondary | ICD-10-CM | POA: Diagnosis not present

## 2014-07-16 DIAGNOSIS — J3081 Allergic rhinitis due to animal (cat) (dog) hair and dander: Secondary | ICD-10-CM | POA: Diagnosis not present

## 2014-07-16 DIAGNOSIS — J3089 Other allergic rhinitis: Secondary | ICD-10-CM | POA: Diagnosis not present

## 2014-07-21 DIAGNOSIS — J301 Allergic rhinitis due to pollen: Secondary | ICD-10-CM | POA: Diagnosis not present

## 2014-07-21 DIAGNOSIS — J3081 Allergic rhinitis due to animal (cat) (dog) hair and dander: Secondary | ICD-10-CM | POA: Diagnosis not present

## 2014-07-21 DIAGNOSIS — J3089 Other allergic rhinitis: Secondary | ICD-10-CM | POA: Diagnosis not present

## 2014-07-28 DIAGNOSIS — J301 Allergic rhinitis due to pollen: Secondary | ICD-10-CM | POA: Diagnosis not present

## 2014-07-28 DIAGNOSIS — J3081 Allergic rhinitis due to animal (cat) (dog) hair and dander: Secondary | ICD-10-CM | POA: Diagnosis not present

## 2014-07-28 DIAGNOSIS — J3089 Other allergic rhinitis: Secondary | ICD-10-CM | POA: Diagnosis not present

## 2014-08-02 DIAGNOSIS — J3081 Allergic rhinitis due to animal (cat) (dog) hair and dander: Secondary | ICD-10-CM | POA: Diagnosis not present

## 2014-08-02 DIAGNOSIS — J301 Allergic rhinitis due to pollen: Secondary | ICD-10-CM | POA: Diagnosis not present

## 2014-08-02 DIAGNOSIS — J3089 Other allergic rhinitis: Secondary | ICD-10-CM | POA: Diagnosis not present

## 2014-08-09 ENCOUNTER — Encounter: Payer: Self-pay | Admitting: *Deleted

## 2014-08-09 DIAGNOSIS — J3081 Allergic rhinitis due to animal (cat) (dog) hair and dander: Secondary | ICD-10-CM | POA: Diagnosis not present

## 2014-08-09 DIAGNOSIS — J3089 Other allergic rhinitis: Secondary | ICD-10-CM | POA: Diagnosis not present

## 2014-08-09 DIAGNOSIS — J301 Allergic rhinitis due to pollen: Secondary | ICD-10-CM | POA: Diagnosis not present

## 2014-08-16 DIAGNOSIS — J3089 Other allergic rhinitis: Secondary | ICD-10-CM | POA: Diagnosis not present

## 2014-08-16 DIAGNOSIS — J3081 Allergic rhinitis due to animal (cat) (dog) hair and dander: Secondary | ICD-10-CM | POA: Diagnosis not present

## 2014-08-16 DIAGNOSIS — J301 Allergic rhinitis due to pollen: Secondary | ICD-10-CM | POA: Diagnosis not present

## 2014-08-24 DIAGNOSIS — J3081 Allergic rhinitis due to animal (cat) (dog) hair and dander: Secondary | ICD-10-CM | POA: Diagnosis not present

## 2014-08-24 DIAGNOSIS — J3089 Other allergic rhinitis: Secondary | ICD-10-CM | POA: Diagnosis not present

## 2014-08-24 DIAGNOSIS — J301 Allergic rhinitis due to pollen: Secondary | ICD-10-CM | POA: Diagnosis not present

## 2014-08-30 DIAGNOSIS — J3081 Allergic rhinitis due to animal (cat) (dog) hair and dander: Secondary | ICD-10-CM | POA: Diagnosis not present

## 2014-08-30 DIAGNOSIS — J301 Allergic rhinitis due to pollen: Secondary | ICD-10-CM | POA: Diagnosis not present

## 2014-08-30 DIAGNOSIS — J3089 Other allergic rhinitis: Secondary | ICD-10-CM | POA: Diagnosis not present

## 2014-09-08 ENCOUNTER — Encounter: Payer: Self-pay | Admitting: *Deleted

## 2014-09-08 DIAGNOSIS — J301 Allergic rhinitis due to pollen: Secondary | ICD-10-CM | POA: Diagnosis not present

## 2014-09-08 DIAGNOSIS — J3089 Other allergic rhinitis: Secondary | ICD-10-CM | POA: Diagnosis not present

## 2014-09-08 DIAGNOSIS — J3081 Allergic rhinitis due to animal (cat) (dog) hair and dander: Secondary | ICD-10-CM | POA: Diagnosis not present

## 2014-09-17 DIAGNOSIS — J301 Allergic rhinitis due to pollen: Secondary | ICD-10-CM | POA: Diagnosis not present

## 2014-09-17 DIAGNOSIS — J3081 Allergic rhinitis due to animal (cat) (dog) hair and dander: Secondary | ICD-10-CM | POA: Diagnosis not present

## 2014-09-17 DIAGNOSIS — J3089 Other allergic rhinitis: Secondary | ICD-10-CM | POA: Diagnosis not present

## 2014-09-23 DIAGNOSIS — J3089 Other allergic rhinitis: Secondary | ICD-10-CM | POA: Diagnosis not present

## 2014-09-23 DIAGNOSIS — J301 Allergic rhinitis due to pollen: Secondary | ICD-10-CM | POA: Diagnosis not present

## 2014-10-04 DIAGNOSIS — J3081 Allergic rhinitis due to animal (cat) (dog) hair and dander: Secondary | ICD-10-CM | POA: Diagnosis not present

## 2014-10-04 DIAGNOSIS — J3089 Other allergic rhinitis: Secondary | ICD-10-CM | POA: Diagnosis not present

## 2014-10-04 DIAGNOSIS — J301 Allergic rhinitis due to pollen: Secondary | ICD-10-CM | POA: Diagnosis not present

## 2014-10-15 DIAGNOSIS — J3081 Allergic rhinitis due to animal (cat) (dog) hair and dander: Secondary | ICD-10-CM | POA: Diagnosis not present

## 2014-10-15 DIAGNOSIS — J3089 Other allergic rhinitis: Secondary | ICD-10-CM | POA: Diagnosis not present

## 2014-10-15 DIAGNOSIS — J301 Allergic rhinitis due to pollen: Secondary | ICD-10-CM | POA: Diagnosis not present

## 2014-10-18 ENCOUNTER — Ambulatory Visit (INDEPENDENT_AMBULATORY_CARE_PROVIDER_SITE_OTHER): Payer: Medicare Other | Admitting: Pediatrics

## 2014-10-18 ENCOUNTER — Encounter: Payer: Self-pay | Admitting: Pediatrics

## 2014-10-18 VITALS — BP 127/74 | HR 62 | Temp 97.0°F | Ht 65.0 in | Wt 175.2 lb

## 2014-10-18 DIAGNOSIS — Z0000 Encounter for general adult medical examination without abnormal findings: Secondary | ICD-10-CM

## 2014-10-18 DIAGNOSIS — Z23 Encounter for immunization: Secondary | ICD-10-CM

## 2014-10-18 NOTE — Progress Notes (Signed)
Subjective:   Cheryl Perry is a 69 y.o. female who presents for a Medicare Annual Wellness Visit.  Would like a flu shot Has been feeling well Pulled something in her neck 11 days ago when deep cleaning house. Feeling much better now. Walking 3 days a week No chest pains or SOB. Uses albuterol every 4-5 weeks. Followed by allergist. Takes shots weekly now.  Avoids dairy due to sensitivity  Review of Systems  All systems negative other than what is in the HPI.  Current Medications (verified) Outpatient Encounter Prescriptions as of 10/18/2014  Medication Sig  . albuterol (PROVENTIL HFA) 108 (90 BASE) MCG/ACT inhaler Inhale 2 puffs into the lungs every 6 (six) hours as needed for wheezing. May use only 3 times a month  . aspirin 81 MG tablet Take 81 mg by mouth daily.  . Biotin 300 MCG TABS Take by mouth.  . Cholecalciferol (VITAMIN D) 2000 UNITS CAPS Take 1 capsule by mouth daily.  . fluticasone-salmeterol (ADVAIR HFA) 45-21 MCG/ACT inhaler Inhale 2 puffs into the lungs 2 (two) times daily.  . hyoscyamine (LEVSIN, ANASPAZ) 0.125 MG tablet Take 0.125 mg by mouth every 4 (four) hours as needed.  Marland Kitchen levothyroxine (SYNTHROID, LEVOTHROID) 75 MCG tablet TAKE 1 TABLET (75 MCG TOTAL)  BY MOUTH DAILY BEFORE BREAKFAST.  Marland Kitchen loratadine (CLARITIN) 10 MG tablet Take 10 mg by mouth daily.  . montelukast (SINGULAIR) 10 MG tablet Take 10 mg by mouth at bedtime.  . Niacin-Lovastatin (ADVICOR) 1000-40 MG TB24 Take 1 tablet by mouth at bedtime.  . Probiotic Product (PROBIOTIC ADVANCED PO) Take by mouth.  . [DISCONTINUED] omeprazole (PRILOSEC) 20 MG capsule Take 1 capsule (20 mg total) by mouth daily.   No facility-administered encounter medications on file as of 10/18/2014.    Allergies (verified) Naproxen; Ampicillin; Avelox; Bactrim; Levaquin; Lipitor; Macrodantin; Premarin; and Trental   History: Past Medical History  Diagnosis Date  . Thyroid disease   . Hyperlipidemia   . Allergy   .  Asthma   . Vasculopathy     lymphatic   Past Surgical History  Procedure Laterality Date  . Tubal ligation    . Knee surgery    . Cystscopy    . Cystoscopy     History reviewed. No pertinent family history. Social History   Occupational History  . Not on file.   Social History Main Topics  . Smoking status: Former Smoker    Quit date: 06/06/1974  . Smokeless tobacco: Not on file  . Alcohol Use: No  . Drug Use: No  . Sexual Activity: Not on file    Do you feel safe at home?  Yes  Dietary issues and exercise activities: Current Exercise Habits:: Home exercise routine, Type of exercise: walking, Time (Minutes): 25, Frequency (Times/Week): 3, Weekly Exercise (Minutes/Week): 75, Intensity: Mild  Current Dietary habits:  Dinner with meat, vegetables  Objective:    Today's Vitals   10/18/14 1059  BP: 127/74  Pulse: 62  Temp: 97 F (36.1 C)  TempSrc: Oral  Height: '5\' 5"'$  (1.651 m)  Weight: 175 lb 3.2 oz (79.47 kg)   Body mass index is 29.15 kg/(m^2).  Activities of Daily Living In your present state of health, do you have any difficulty performing the following activities: 10/18/2014  Hearing? Y  Vision? N  Difficulty concentrating or making decisions? N  Walking or climbing stairs? N  Dressing or bathing? N  Doing errands, shopping? N  Preparing Food and eating ? N  Using the Toilet? N  In the past six months, have you accidently leaked urine? N  Do you have problems with loss of bowel control? Y  Managing your Medications? N  Managing your Finances? N  Housekeeping or managing your Housekeeping? N    Are there smokers in your home (other than you)? No   Cardiac Risk Factors include: advanced age (>19mn, >>87women)  Depression Screen PHQ 2/9 Scores 10/18/2014 12/09/2013 07/31/2013 06/09/2013  PHQ - 2 Score 0 0 0 0    Fall Risk Fall Risk  10/18/2014 12/09/2013 07/31/2013 06/09/2013 04/30/2013  Falls in the past year? No No No No No    Cognitive  Function: MMSE - Mini Mental State Exam 10/18/2014  Orientation to time 5  Orientation to Place 5  Registration 3  Attention/ Calculation 5  Recall 3  Language- name 2 objects 2  Language- repeat 1  Language- follow 3 step command 3  Language- read & follow direction 1  Write a sentence 1  Copy design 1  Total score 30    Immunizations and Health Maintenance Immunization History  Administered Date(s) Administered  . Influenza-Unspecified 12/09/2012, 11/09/2013  . Pneumococcal Conjugate-13 12/09/2013  . Pneumococcal-Unspecified 04/09/2011  . Tdap 06/09/2003, 06/09/2013  . Zoster 04/09/2010   Health Maintenance Due  Topic Date Due  . Hepatitis C Screening  012/26/1947 . INFLUENZA VACCINE  08/09/2014    Patient Care Team: TAdella Nissen PA-C as PCP - General (Physician Assistant)  Indicate any recent Medical Services you may have received from other than [facility] providers in the past year (date may be approximate).    Assessment:    Annual Wellness Visit    Screening Tests Health Maintenance  Topic Date Due  . Hepatitis C Screening  [id number] . INFLUENZA VACCINE  08/09/2014  . MAMMOGRAM  04/11/2016  . COLONOSCOPY  01/08/2018  . TETANUS/TDAP  06/10/2023  . DEXA SCAN  Completed  . ZOSTAVAX  Completed  . PNA vac Low Risk Adult  Completed        Plan:   During the course of the visit BCalebwas educated and counseled about the following appropriate screening and preventive services:   Vaccines to include Pneumoccal, Influenza, Td, Zostavax--needs flu today  Colorectal cancer screening--UTD, due in 2020  Cardiovascular disease screening--UTD, on lovastatin  Diabetes screening--UTD  Bone Denisty / Osteoporosis Screening--UTD  Mammogram--UTD  PAP--pt declined, no history of abnormals, no new partners  Glaucoma screening--UTD  Nutrition counseling--completed  Advanced Directives--HCPOA is her husband, has a living will, will bring in copy   Goals     . Weight < 160 lb (72.576 kg)        Patient Instructions (the written plan) were given to the patient.   CEustaquio Maize MD   10/18/2014

## 2014-10-22 DIAGNOSIS — J3081 Allergic rhinitis due to animal (cat) (dog) hair and dander: Secondary | ICD-10-CM | POA: Diagnosis not present

## 2014-10-22 DIAGNOSIS — J301 Allergic rhinitis due to pollen: Secondary | ICD-10-CM | POA: Diagnosis not present

## 2014-10-22 DIAGNOSIS — J3089 Other allergic rhinitis: Secondary | ICD-10-CM | POA: Diagnosis not present

## 2014-10-28 DIAGNOSIS — J3081 Allergic rhinitis due to animal (cat) (dog) hair and dander: Secondary | ICD-10-CM | POA: Diagnosis not present

## 2014-10-28 DIAGNOSIS — J3089 Other allergic rhinitis: Secondary | ICD-10-CM | POA: Diagnosis not present

## 2014-10-28 DIAGNOSIS — J301 Allergic rhinitis due to pollen: Secondary | ICD-10-CM | POA: Diagnosis not present

## 2014-10-28 DIAGNOSIS — J4540 Moderate persistent asthma, uncomplicated: Secondary | ICD-10-CM | POA: Diagnosis not present

## 2014-11-02 DIAGNOSIS — J301 Allergic rhinitis due to pollen: Secondary | ICD-10-CM | POA: Diagnosis not present

## 2014-11-02 DIAGNOSIS — J3089 Other allergic rhinitis: Secondary | ICD-10-CM | POA: Diagnosis not present

## 2014-11-05 DIAGNOSIS — J301 Allergic rhinitis due to pollen: Secondary | ICD-10-CM | POA: Diagnosis not present

## 2014-11-05 DIAGNOSIS — J3089 Other allergic rhinitis: Secondary | ICD-10-CM | POA: Diagnosis not present

## 2014-11-05 DIAGNOSIS — J3081 Allergic rhinitis due to animal (cat) (dog) hair and dander: Secondary | ICD-10-CM | POA: Diagnosis not present

## 2014-11-08 DIAGNOSIS — J3089 Other allergic rhinitis: Secondary | ICD-10-CM | POA: Diagnosis not present

## 2014-11-08 DIAGNOSIS — J301 Allergic rhinitis due to pollen: Secondary | ICD-10-CM | POA: Diagnosis not present

## 2014-11-08 DIAGNOSIS — J3081 Allergic rhinitis due to animal (cat) (dog) hair and dander: Secondary | ICD-10-CM | POA: Diagnosis not present

## 2014-11-11 DIAGNOSIS — J3089 Other allergic rhinitis: Secondary | ICD-10-CM | POA: Diagnosis not present

## 2014-11-11 DIAGNOSIS — J3081 Allergic rhinitis due to animal (cat) (dog) hair and dander: Secondary | ICD-10-CM | POA: Diagnosis not present

## 2014-11-11 DIAGNOSIS — J301 Allergic rhinitis due to pollen: Secondary | ICD-10-CM | POA: Diagnosis not present

## 2014-11-18 DIAGNOSIS — J3089 Other allergic rhinitis: Secondary | ICD-10-CM | POA: Diagnosis not present

## 2014-11-18 DIAGNOSIS — J301 Allergic rhinitis due to pollen: Secondary | ICD-10-CM | POA: Diagnosis not present

## 2014-11-18 DIAGNOSIS — J3081 Allergic rhinitis due to animal (cat) (dog) hair and dander: Secondary | ICD-10-CM | POA: Diagnosis not present

## 2014-11-26 DIAGNOSIS — J3089 Other allergic rhinitis: Secondary | ICD-10-CM | POA: Diagnosis not present

## 2014-11-26 DIAGNOSIS — J3081 Allergic rhinitis due to animal (cat) (dog) hair and dander: Secondary | ICD-10-CM | POA: Diagnosis not present

## 2014-11-26 DIAGNOSIS — J301 Allergic rhinitis due to pollen: Secondary | ICD-10-CM | POA: Diagnosis not present

## 2014-12-01 DIAGNOSIS — J301 Allergic rhinitis due to pollen: Secondary | ICD-10-CM | POA: Diagnosis not present

## 2014-12-01 DIAGNOSIS — J3081 Allergic rhinitis due to animal (cat) (dog) hair and dander: Secondary | ICD-10-CM | POA: Diagnosis not present

## 2014-12-01 DIAGNOSIS — J3089 Other allergic rhinitis: Secondary | ICD-10-CM | POA: Diagnosis not present

## 2014-12-06 DIAGNOSIS — J301 Allergic rhinitis due to pollen: Secondary | ICD-10-CM | POA: Diagnosis not present

## 2014-12-06 DIAGNOSIS — J3089 Other allergic rhinitis: Secondary | ICD-10-CM | POA: Diagnosis not present

## 2014-12-06 DIAGNOSIS — J3081 Allergic rhinitis due to animal (cat) (dog) hair and dander: Secondary | ICD-10-CM | POA: Diagnosis not present

## 2014-12-13 DIAGNOSIS — J3081 Allergic rhinitis due to animal (cat) (dog) hair and dander: Secondary | ICD-10-CM | POA: Diagnosis not present

## 2014-12-13 DIAGNOSIS — J301 Allergic rhinitis due to pollen: Secondary | ICD-10-CM | POA: Diagnosis not present

## 2014-12-13 DIAGNOSIS — J3089 Other allergic rhinitis: Secondary | ICD-10-CM | POA: Diagnosis not present

## 2014-12-14 ENCOUNTER — Ambulatory Visit (INDEPENDENT_AMBULATORY_CARE_PROVIDER_SITE_OTHER): Payer: Medicare Other | Admitting: Family

## 2014-12-14 ENCOUNTER — Encounter: Payer: Self-pay | Admitting: Family

## 2014-12-14 VITALS — BP 123/79 | HR 75 | Temp 97.0°F | Ht 65.0 in | Wt 172.0 lb

## 2014-12-14 DIAGNOSIS — K219 Gastro-esophageal reflux disease without esophagitis: Secondary | ICD-10-CM | POA: Diagnosis not present

## 2014-12-14 DIAGNOSIS — E039 Hypothyroidism, unspecified: Secondary | ICD-10-CM | POA: Diagnosis not present

## 2014-12-14 DIAGNOSIS — M8580 Other specified disorders of bone density and structure, unspecified site: Secondary | ICD-10-CM | POA: Diagnosis not present

## 2014-12-14 DIAGNOSIS — E038 Other specified hypothyroidism: Secondary | ICD-10-CM | POA: Diagnosis not present

## 2014-12-14 DIAGNOSIS — E785 Hyperlipidemia, unspecified: Secondary | ICD-10-CM | POA: Diagnosis not present

## 2014-12-14 DIAGNOSIS — J4530 Mild persistent asthma, uncomplicated: Secondary | ICD-10-CM | POA: Diagnosis not present

## 2014-12-14 DIAGNOSIS — Z1159 Encounter for screening for other viral diseases: Secondary | ICD-10-CM

## 2014-12-14 DIAGNOSIS — E559 Vitamin D deficiency, unspecified: Secondary | ICD-10-CM

## 2014-12-14 MED ORDER — METHOCARBAMOL 500 MG PO TABS: 500.0000 mg | ORAL_TABLET | Freq: Three times a day (TID) | ORAL | Status: DC

## 2014-12-14 MED ORDER — FLUTICASONE-SALMETEROL 45-21 MCG/ACT IN AERO: 2.0000 | INHALATION_SPRAY | Freq: Two times a day (BID) | RESPIRATORY_TRACT | Status: DC

## 2014-12-14 MED ORDER — LEVOTHYROXINE SODIUM 75 MCG PO TABS: ORAL_TABLET | ORAL | Status: DC

## 2014-12-14 NOTE — Patient Instructions (Signed)
Health Maintenance, Female Adopting a healthy lifestyle and getting preventive care can go a long way to promote health and wellness. Talk with your health care provider about what schedule of regular examinations is right for you. This is a good chance for you to check in with your provider about disease prevention and staying healthy. In between checkups, there are plenty of things you can do on your own. Experts have done a lot of research about which lifestyle changes and preventive measures are most likely to keep you healthy. Ask your health care provider for more information. WEIGHT AND DIET  Eat a healthy diet  Be sure to include plenty of vegetables, fruits, low-fat dairy products, and lean protein.  Do not eat a lot of foods high in solid fats, added sugars, or salt.  Get regular exercise. This is one of the most important things you can do for your health.  Most adults should exercise for at least 150 minutes each week. The exercise should increase your heart rate and make you sweat (moderate-intensity exercise).  Most adults should also do strengthening exercises at least twice a week. This is in addition to the moderate-intensity exercise.  Maintain a healthy weight  Body mass index (BMI) is a measurement that can be used to identify possible weight problems. It estimates body fat based on height and weight. Your health care provider can help determine your BMI and help you achieve or maintain a healthy weight.  For females 20 years of age and older:   A BMI below 18.5 is considered underweight.  A BMI of 18.5 to 24.9 is normal.  A BMI of 25 to 29.9 is considered overweight.  A BMI of 30 and above is considered obese.  Watch levels of cholesterol and blood lipids  You should start having your blood tested for lipids and cholesterol at 69 years of age, then have this test every 5 years.  You may need to have your cholesterol levels checked more often if:  Your lipid  or cholesterol levels are high.  You are older than 69 years of age.  You are at high risk for heart disease.  CANCER SCREENING   Lung Cancer  Lung cancer screening is recommended for adults 55-80 years old who are at high risk for lung cancer because of a history of smoking.  A yearly low-dose CT scan of the lungs is recommended for people who:  Currently smoke.  Have quit within the past 15 years.  Have at least a 30-pack-year history of smoking. A pack year is smoking an average of one pack of cigarettes a day for 1 year.  Yearly screening should continue until it has been 15 years since you quit.  Yearly screening should stop if you develop a health problem that would prevent you from having lung cancer treatment.  Breast Cancer  Practice breast self-awareness. This means understanding how your breasts normally appear and feel.  It also means doing regular breast self-exams. Let your health care provider know about any changes, no matter how small.  If you are in your 20s or 30s, you should have a clinical breast exam (CBE) by a health care provider every 1-3 years as part of a regular health exam.  If you are 40 or older, have a CBE every year. Also consider having a breast X-ray (mammogram) every year.  If you have a family history of breast cancer, talk to your health care provider about genetic screening.  If you   are at high risk for breast cancer, talk to your health care provider about having an MRI and a mammogram every year.  Breast cancer gene (BRCA) assessment is recommended for women who have family members with BRCA-related cancers. BRCA-related cancers include:  Breast.  Ovarian.  Tubal.  Peritoneal cancers.  Results of the assessment will determine the need for genetic counseling and BRCA1 and BRCA2 testing. Cervical Cancer Your health care provider may recommend that you be screened regularly for cancer of the pelvic organs (ovaries, uterus, and  vagina). This screening involves a pelvic examination, including checking for microscopic changes to the surface of your cervix (Pap test). You may be encouraged to have this screening done every 3 years, beginning at age 21.  For women ages 30-65, health care providers may recommend pelvic exams and Pap testing every 3 years, or they may recommend the Pap and pelvic exam, combined with testing for human papilloma virus (HPV), every 5 years. Some types of HPV increase your risk of cervical cancer. Testing for HPV may also be done on women of any age with unclear Pap test results.  Other health care providers may not recommend any screening for nonpregnant women who are considered low risk for pelvic cancer and who do not have symptoms. Ask your health care provider if a screening pelvic exam is right for you.  If you have had past treatment for cervical cancer or a condition that could lead to cancer, you need Pap tests and screening for cancer for at least 20 years after your treatment. If Pap tests have been discontinued, your risk factors (such as having a new sexual partner) need to be reassessed to determine if screening should resume. Some women have medical problems that increase the chance of getting cervical cancer. In these cases, your health care provider may recommend more frequent screening and Pap tests. Colorectal Cancer  This type of cancer can be detected and often prevented.  Routine colorectal cancer screening usually begins at 69 years of age and continues through 69 years of age.  Your health care provider may recommend screening at an earlier age if you have risk factors for colon cancer.  Your health care provider may also recommend using home test kits to check for hidden blood in the stool.  A small camera at the end of a tube can be used to examine your colon directly (sigmoidoscopy or colonoscopy). This is done to check for the earliest forms of colorectal  cancer.  Routine screening usually begins at age 50.  Direct examination of the colon should be repeated every 5-10 years through 69 years of age. However, you may need to be screened more often if early forms of precancerous polyps or small growths are found. Skin Cancer  Check your skin from head to toe regularly.  Tell your health care provider about any new moles or changes in moles, especially if there is a change in a mole's shape or color.  Also tell your health care provider if you have a mole that is larger than the size of a pencil eraser.  Always use sunscreen. Apply sunscreen liberally and repeatedly throughout the day.  Protect yourself by wearing long sleeves, pants, a wide-brimmed hat, and sunglasses whenever you are outside. HEART DISEASE, DIABETES, AND HIGH BLOOD PRESSURE   High blood pressure causes heart disease and increases the risk of stroke. High blood pressure is more likely to develop in:  People who have blood pressure in the high end   of the normal range (130-139/85-89 mm Hg).  People who are overweight or obese.  People who are African American.  If you are 38-23 years of age, have your blood pressure checked every 3-5 years. If you are 61 years of age or older, have your blood pressure checked every year. You should have your blood pressure measured twice--once when you are at a hospital or clinic, and once when you are not at a hospital or clinic. Record the average of the two measurements. To check your blood pressure when you are not at a hospital or clinic, you can use:  An automated blood pressure machine at a pharmacy.  A home blood pressure monitor.  If you are between 45 years and 39 years old, ask your health care provider if you should take aspirin to prevent strokes.  Have regular diabetes screenings. This involves taking a blood sample to check your fasting blood sugar level.  If you are at a normal weight and have a low risk for diabetes,  have this test once every three years after 68 years of age.  If you are overweight and have a high risk for diabetes, consider being tested at a younger age or more often. PREVENTING INFECTION  Hepatitis B  If you have a higher risk for hepatitis B, you should be screened for this virus. You are considered at high risk for hepatitis B if:  You were born in a country where hepatitis B is common. Ask your health care provider which countries are considered high risk.  Your parents were born in a high-risk country, and you have not been immunized against hepatitis B (hepatitis B vaccine).  You have HIV or AIDS.  You use needles to inject street drugs.  You live with someone who has hepatitis B.  You have had sex with someone who has hepatitis B.  You get hemodialysis treatment.  You take certain medicines for conditions, including cancer, organ transplantation, and autoimmune conditions. Hepatitis C  Blood testing is recommended for:  Everyone born from [birth year] through 1965.  Anyone with known risk factors for hepatitis C. Sexually transmitted infections (STIs)  You should be screened for sexually transmitted infections (STIs) including gonorrhea and chlamydia if:  You are sexually active and are younger than 69 years of age.  You are older than 69 years of age and your health care provider tells you that you are at risk for this type of infection.  Your sexual activity has changed since you were last screened and you are at an increased risk for chlamydia or gonorrhea. Ask your health care provider if you are at risk.  If you do not have HIV, but are at risk, it may be recommended that you take a prescription medicine daily to prevent HIV infection. This is called pre-exposure prophylaxis (PrEP). You are considered at risk if:  You are sexually active and do not regularly use condoms or know the HIV status of your partner(s).  You take drugs by injection.  You are sexually  active with a partner who has HIV. Talk with your health care provider about whether you are at high risk of being infected with HIV. If you choose to begin PrEP, you should first be tested for HIV. You should then be tested every 3 months for as long as you are taking PrEP.  PREGNANCY   If you are premenopausal and you may become pregnant, ask your health care provider about preconception counseling.  If you may  become pregnant, take 400 to 800 micrograms (mcg) of folic acid every day.  If you want to prevent pregnancy, talk to your health care provider about birth control (contraception). OSTEOPOROSIS AND MENOPAUSE   Osteoporosis is a disease in which the bones lose minerals and strength with aging. This can result in serious bone fractures. Your risk for osteoporosis can be identified using a bone density scan.  If you are 61 years of age or older, or if you are at risk for osteoporosis and fractures, ask your health care provider if you should be screened.  Ask your health care provider whether you should take a calcium or vitamin D supplement to lower your risk for osteoporosis.  Menopause may have certain physical symptoms and risks.  Hormone replacement therapy may reduce some of these symptoms and risks. Talk to your health care provider about whether hormone replacement therapy is right for you.  HOME CARE INSTRUCTIONS   Schedule regular health, dental, and eye exams.  Stay current with your immunizations.   Do not use any tobacco products including cigarettes, chewing tobacco, or electronic cigarettes.  If you are pregnant, do not drink alcohol.  If you are breastfeeding, limit how much and how often you drink alcohol.  Limit alcohol intake to no more than 1 drink per day for nonpregnant women. One drink equals 12 ounces of beer, 5 ounces of wine, or 1 ounces of hard liquor.  Do not use street drugs.  Do not share needles.  Ask your health care provider for help if  you need support or information about quitting drugs.  Tell your health care provider if you often feel depressed.  Tell your health care provider if you have ever been abused or do not feel safe at home.   This information is not intended to replace advice given to you by your health care provider. Make sure you discuss any questions you have with your health care provider.   Document Released: 07/10/2010 Document Revised: 01/15/2014 Document Reviewed: 11/26/2012 Elsevier Interactive Patient Education [employer].

## 2014-12-14 NOTE — Addendum Note (Signed)
Addended by: [name] A on: 12/14/2014 10:39 AM   Modules accepted: Orders

## 2014-12-14 NOTE — Progress Notes (Signed)
Subjective:    Patient ID: Cheryl Perry, female    DOB: 05-Nov-1945, 69 y.o.   MRN: [id number]  Pt presents to the office today for chronic follow up.  Hyperlipidemia This is a chronic problem. The current episode started more than 1 year ago. The problem is uncontrolled. Recent lipid tests were reviewed and are high. Exacerbating diseases include hypothyroidism. She has no history of diabetes. Pertinent negatives include no leg pain, myalgias or shortness of breath. Current antihyperlipidemic treatment includes statins and nicotinic acid. The current treatment provides significant improvement of lipids. Risk factors for coronary artery disease include dyslipidemia, obesity, post-menopausal, a sedentary lifestyle and family history.  Thyroid Problem Presents for follow-up visit. Symptoms include dry skin and hair loss. Patient reports no anxiety, constipation, heat intolerance, hoarse voice, leg swelling, nail problem, palpitations or visual change. The symptoms have been stable. Past treatments include levothyroxine. The treatment provided significant relief. Her past medical history is significant for hyperlipidemia. There is no history of diabetes.  Gastroesophageal Reflux She reports no belching, no coughing, no heartburn, no hoarse voice or no sore throat. This is a chronic problem. The current episode started more than 1 year ago. The problem occurs rarely. The problem has been resolved. The symptoms are aggravated by certain foods. She has tried a PPI for the symptoms. The treatment provided significant relief.  Asthma There is no cough, hoarse voice or shortness of breath. This is a chronic problem. The current episode started more than 1 year ago. The problem occurs intermittently. The problem has been rapidly improving. Pertinent negatives include no headaches, heartburn, myalgias or sore throat. Her symptoms are aggravated by pollen. Her symptoms are alleviated by leukotriene antagonist,  rest and beta-agonist. She reports significant improvement on treatment. Her symptoms are not alleviated by rest, ipratropium and leukotriene antagonist. Her past medical history is significant for asthma.      Review of Systems  Constitutional: Negative.   HENT: Negative.  Negative for hoarse voice and sore throat.   Eyes: Negative.   Respiratory: Negative.  Negative for cough and shortness of breath.   Cardiovascular: Negative.  Negative for palpitations.  Gastrointestinal: Negative.  Negative for heartburn and constipation.  Endocrine: Negative.  Negative for heat intolerance.  Genitourinary: Negative.   Musculoskeletal: Negative.  Negative for myalgias.  Neurological: Negative.  Negative for headaches.  Hematological: Negative.   Psychiatric/Behavioral: Negative.  The patient is not nervous/anxious.   All other systems reviewed and are negative.      Objective:   Physical Exam  Constitutional: She is oriented to person, place, and time. She appears well-developed and well-nourished. No distress.  HENT:  Head: Normocephalic and atraumatic.  Right Ear: External ear normal.  Left Ear: External ear normal.  Nose: Nose normal.  Mouth/Throat: Oropharynx is clear and moist.  Eyes: Pupils are equal, round, and reactive to light.  Neck: Normal range of motion. Neck supple. No thyromegaly present.  Cardiovascular: Normal rate, regular rhythm, normal heart sounds and intact distal pulses.   No murmur heard. Pulmonary/Chest: Effort normal and breath sounds normal. No respiratory distress. She has no wheezes.  Abdominal: Soft. Bowel sounds are normal. She exhibits no distension. There is no tenderness.  Musculoskeletal: Normal range of motion. She exhibits no edema or tenderness.  Neurological: She is alert and oriented to person, place, and time. She has normal reflexes. No cranial nerve deficit.  Skin: Skin is warm and dry.  Psychiatric: She has a normal mood and affect. Her  behavior  is normal. Judgment and thought content normal.  Vitals reviewed.   BP 123/79 mmHg  Pulse 75  Temp(Src) 97 F (36.1 C) (Oral)  Ht 5' 5"  (1.651 m)  Wt 172 lb (78.019 kg)  BMI 28.62 kg/m2      Assessment & Plan:  1. Asthma, mild persistent, uncomplicated - UGA48+EFUW  2. Vitamin D deficiency - CMP14+EGFR - VITAMIN D 25 Hydroxy (Vit-D Deficiency, Fractures)  3. HLD (hyperlipidemia) - CMP14+EGFR - Lipid panel  4. Osteopenia - CMP14+EGFR  5. Hypothyroidism, unspecified hypothyroidism type - CMP14+EGFR - Thyroid Panel With TSH  6. Gastroesophageal reflux disease, esophagitis presence not specified - CMP14+EGFR  7. Need for hepatitis C screening test - CMP14+EGFR - Hepatitis C antibody   Continue all meds Labs pending Health Maintenance reviewed Diet and exercise encouraged RTO 6  months  [name], FNP

## 2014-12-15 ENCOUNTER — Ambulatory Visit: Payer: Medicare Other | Admitting: Family

## 2014-12-15 ENCOUNTER — Other Ambulatory Visit: Payer: Self-pay | Admitting: Family

## 2014-12-15 LAB — CMP14+EGFR
ALBUMIN: 4.4 g/dL (ref 3.6–4.8)
ALK PHOS: 81 IU/L (ref 39–117)
ALT: 19 IU/L (ref 0–32)
AST: 22 IU/L (ref 0–40)
Albumin/Globulin Ratio: 2.2 (ref 1.1–2.5)
BUN / CREAT RATIO: 20 (ref 11–26)
BUN: 14 mg/dL (ref 8–27)
Bilirubin Total: 0.3 mg/dL (ref 0.0–1.2)
CALCIUM: 9.6 mg/dL (ref 8.7–10.3)
CO2: 24 mmol/L (ref 18–29)
CREATININE: 0.7 mg/dL (ref 0.57–1.00)
Chloride: 101 mmol/L (ref 97–106)
GFR calc Af Amer: 102 mL/min/{1.73_m2} (ref >59)
GFR, EST NON AFRICAN AMERICAN: 89 mL/min/{1.73_m2} (ref >59)
GLOBULIN, TOTAL: 2 g/dL (ref 1.5–4.5)
GLUCOSE: 92 mg/dL (ref 65–99)
Potassium: 4.3 mmol/L (ref 3.5–5.2)
SODIUM: 142 mmol/L (ref 136–144)
Total Protein: 6.4 g/dL (ref 6.0–8.5)

## 2014-12-15 LAB — LIPID PANEL
Chol/HDL Ratio: 2.3 ratio units (ref 0.0–4.4)
Cholesterol, Total: 224 mg/dL — ABNORMAL HIGH (ref 100–199)
HDL: 96 mg/dL (ref >39)
LDL CALC: 116 mg/dL — AB (ref 0–99)
TRIGLYCERIDES: 62 mg/dL (ref 0–149)
VLDL CHOLESTEROL CAL: 12 mg/dL (ref 5–40)

## 2014-12-15 LAB — THYROID PANEL WITH TSH
FREE THYROXINE INDEX: 2.3 (ref 1.2–4.9)
T3 Uptake Ratio: 28 % (ref 24–39)
T4, Total: 8.2 ug/dL (ref 4.5–12.0)
TSH: 2.9 u[IU]/mL (ref 0.450–4.500)

## 2014-12-15 LAB — HEPATITIS C ANTIBODY: Hep C Virus Ab: <0.1 s/co ratio (ref 0.0–0.9)

## 2014-12-15 LAB — VITAMIN D 25 HYDROXY (VIT D DEFICIENCY, FRACTURES): Vit D, 25-Hydroxy: 37.8 ng/mL (ref 30.0–100.0)

## 2014-12-30 DIAGNOSIS — J3081 Allergic rhinitis due to animal (cat) (dog) hair and dander: Secondary | ICD-10-CM | POA: Diagnosis not present

## 2014-12-30 DIAGNOSIS — J301 Allergic rhinitis due to pollen: Secondary | ICD-10-CM | POA: Diagnosis not present

## 2014-12-30 DIAGNOSIS — J3089 Other allergic rhinitis: Secondary | ICD-10-CM | POA: Diagnosis not present

## 2015-01-05 DIAGNOSIS — J3081 Allergic rhinitis due to animal (cat) (dog) hair and dander: Secondary | ICD-10-CM | POA: Diagnosis not present

## 2015-01-05 DIAGNOSIS — J301 Allergic rhinitis due to pollen: Secondary | ICD-10-CM | POA: Diagnosis not present

## 2015-01-05 DIAGNOSIS — J3089 Other allergic rhinitis: Secondary | ICD-10-CM | POA: Diagnosis not present

## 2015-01-12 DIAGNOSIS — J3081 Allergic rhinitis due to animal (cat) (dog) hair and dander: Secondary | ICD-10-CM | POA: Diagnosis not present

## 2015-01-12 DIAGNOSIS — J301 Allergic rhinitis due to pollen: Secondary | ICD-10-CM | POA: Diagnosis not present

## 2015-01-12 DIAGNOSIS — J3089 Other allergic rhinitis: Secondary | ICD-10-CM | POA: Diagnosis not present

## 2015-01-19 DIAGNOSIS — J3089 Other allergic rhinitis: Secondary | ICD-10-CM | POA: Diagnosis not present

## 2015-01-19 DIAGNOSIS — J3081 Allergic rhinitis due to animal (cat) (dog) hair and dander: Secondary | ICD-10-CM | POA: Diagnosis not present

## 2015-01-19 DIAGNOSIS — J301 Allergic rhinitis due to pollen: Secondary | ICD-10-CM | POA: Diagnosis not present

## 2015-01-25 DIAGNOSIS — J3081 Allergic rhinitis due to animal (cat) (dog) hair and dander: Secondary | ICD-10-CM | POA: Diagnosis not present

## 2015-01-25 DIAGNOSIS — J301 Allergic rhinitis due to pollen: Secondary | ICD-10-CM | POA: Diagnosis not present

## 2015-01-25 DIAGNOSIS — J3089 Other allergic rhinitis: Secondary | ICD-10-CM | POA: Diagnosis not present

## 2015-01-27 ENCOUNTER — Encounter (HOSPITAL_COMMUNITY): Payer: Self-pay | Admitting: Emergency Medicine

## 2015-01-27 ENCOUNTER — Inpatient Hospital Stay (HOSPITAL_COMMUNITY)
Admission: EM | Admit: 2015-01-27 | Discharge: 2015-01-30 | DRG: 378 | Disposition: A | Discharge status: Home / Self Care | Payer: Medicare Other | Attending: Internal Medicine | Admitting: Internal Medicine

## 2015-01-27 ENCOUNTER — Encounter: Payer: Self-pay | Admitting: Family

## 2015-01-27 ENCOUNTER — Ambulatory Visit (INDEPENDENT_AMBULATORY_CARE_PROVIDER_SITE_OTHER): Payer: Medicare Other | Admitting: Family

## 2015-01-27 VITALS — BP 113/69 | HR 88 | Temp 97.2°F | Ht 65.0 in | Wt 176.8 lb

## 2015-01-27 DIAGNOSIS — K219 Gastro-esophageal reflux disease without esophagitis: Secondary | ICD-10-CM | POA: Diagnosis present

## 2015-01-27 DIAGNOSIS — J4520 Mild intermittent asthma, uncomplicated: Secondary | ICD-10-CM | POA: Diagnosis not present

## 2015-01-27 DIAGNOSIS — E785 Hyperlipidemia, unspecified: Secondary | ICD-10-CM | POA: Diagnosis present

## 2015-01-27 DIAGNOSIS — R195 Other fecal abnormalities: Secondary | ICD-10-CM

## 2015-01-27 DIAGNOSIS — D62 Acute posthemorrhagic anemia: Secondary | ICD-10-CM | POA: Diagnosis present

## 2015-01-27 DIAGNOSIS — Z823 Family history of stroke: Secondary | ICD-10-CM

## 2015-01-27 DIAGNOSIS — K921 Melena: Secondary | ICD-10-CM | POA: Diagnosis not present

## 2015-01-27 DIAGNOSIS — K922 Gastrointestinal hemorrhage, unspecified: Secondary | ICD-10-CM | POA: Diagnosis present

## 2015-01-27 DIAGNOSIS — K254 Chronic or unspecified gastric ulcer with hemorrhage: Secondary | ICD-10-CM | POA: Diagnosis not present

## 2015-01-27 DIAGNOSIS — J4530 Mild persistent asthma, uncomplicated: Secondary | ICD-10-CM

## 2015-01-27 DIAGNOSIS — K317 Polyp of stomach and duodenum: Secondary | ICD-10-CM | POA: Diagnosis present

## 2015-01-27 DIAGNOSIS — M545 Low back pain: Secondary | ICD-10-CM | POA: Diagnosis present

## 2015-01-27 DIAGNOSIS — J45909 Unspecified asthma, uncomplicated: Secondary | ICD-10-CM | POA: Diagnosis not present

## 2015-01-27 DIAGNOSIS — G8929 Other chronic pain: Secondary | ICD-10-CM | POA: Diagnosis present

## 2015-01-27 DIAGNOSIS — Z8719 Personal history of other diseases of the digestive system: Secondary | ICD-10-CM | POA: Insufficient documentation

## 2015-01-27 DIAGNOSIS — K270 Acute peptic ulcer, site unspecified, with hemorrhage: Secondary | ICD-10-CM | POA: Diagnosis not present

## 2015-01-27 DIAGNOSIS — Z7982 Long term (current) use of aspirin: Secondary | ICD-10-CM | POA: Diagnosis not present

## 2015-01-27 DIAGNOSIS — K449 Diaphragmatic hernia without obstruction or gangrene: Secondary | ICD-10-CM | POA: Diagnosis present

## 2015-01-27 DIAGNOSIS — Z87891 Personal history of nicotine dependence: Secondary | ICD-10-CM | POA: Diagnosis not present

## 2015-01-27 DIAGNOSIS — Z800 Family history of malignant neoplasm of digestive organs: Secondary | ICD-10-CM

## 2015-01-27 DIAGNOSIS — T39395A Adverse effect of other nonsteroidal anti-inflammatory drugs [NSAID], initial encounter: Secondary | ICD-10-CM | POA: Diagnosis present

## 2015-01-27 DIAGNOSIS — E039 Hypothyroidism, unspecified: Secondary | ICD-10-CM

## 2015-01-27 HISTORY — DX: Dorsalgia, unspecified: M54.9

## 2015-01-27 LAB — RETICULOCYTES
RBC.: 2.18 MIL/uL — ABNORMAL LOW (ref 3.87–5.11)
Retic Count, Absolute: 126.4 10*3/uL (ref 19.0–186.0)
Retic Ct Pct: 5.8 % — ABNORMAL HIGH (ref 0.4–3.1)

## 2015-01-27 LAB — CBC WITH DIFFERENTIAL/PLATELET
BASOS ABS: 0.1 10*3/uL (ref 0.0–0.1)
BASOS PCT: 1 %
EOS ABS: 0.3 10*3/uL (ref 0.0–0.7)
EOS PCT: 5 %
HCT: 22.1 % — ABNORMAL LOW (ref 36.0–46.0)
Hemoglobin: 7.3 g/dL — ABNORMAL LOW (ref 12.0–15.0)
LYMPHS PCT: 24 %
Lymphs Abs: 1.6 10*3/uL (ref 0.7–4.0)
MCH: 33.5 pg (ref 26.0–34.0)
MCHC: 33 g/dL (ref 30.0–36.0)
MCV: 101.4 fL — AB (ref 78.0–100.0)
MONO ABS: 0.4 10*3/uL (ref 0.1–1.0)
Monocytes Relative: 7 %
Neutro Abs: 4.3 10*3/uL (ref 1.7–7.7)
Neutrophils Relative %: 63 %
PLATELETS: 198 10*3/uL (ref 150–400)
RBC: 2.18 MIL/uL — AB (ref 3.87–5.11)
RDW: 13.9 % (ref 11.5–15.5)
WBC: 6.7 10*3/uL (ref 4.0–10.5)

## 2015-01-27 LAB — VITAMIN B12: VITAMIN B 12: 273 pg/mL (ref 180–914)

## 2015-01-27 LAB — COMPREHENSIVE METABOLIC PANEL
ALT: 18 U/L (ref 14–54)
AST: 24 U/L (ref 15–41)
Albumin: 3 g/dL — ABNORMAL LOW (ref 3.5–5.0)
Alkaline Phosphatase: 43 U/L (ref 38–126)
Anion gap: 3 — ABNORMAL LOW (ref 5–15)
BUN: 20 mg/dL (ref 6–20)
CHLORIDE: 113 mmol/L — AB (ref 101–111)
CO2: 26 mmol/L (ref 22–32)
CREATININE: 0.69 mg/dL (ref 0.44–1.00)
Calcium: 8 mg/dL — ABNORMAL LOW (ref 8.9–10.3)
GFR calc Af Amer: >60 mL/min (ref >60)
Glucose, Bld: 111 mg/dL — ABNORMAL HIGH (ref 65–99)
POTASSIUM: 3.6 mmol/L (ref 3.5–5.1)
SODIUM: 142 mmol/L (ref 135–145)
Total Bilirubin: 0.3 mg/dL (ref 0.3–1.2)
Total Protein: 4.7 g/dL — ABNORMAL LOW (ref 6.5–8.1)

## 2015-01-27 LAB — FERRITIN: Ferritin: 26 ng/mL (ref 11–307)

## 2015-01-27 LAB — IRON AND TIBC
Iron: 70 ug/dL (ref 28–170)
SATURATION RATIOS: 26 % (ref 10.4–31.8)
TIBC: 270 ug/dL (ref 250–450)
UIBC: 200 ug/dL

## 2015-01-27 LAB — FOLATE: FOLATE: 16.1 ng/mL (ref >5.9)

## 2015-01-27 LAB — PREPARE RBC (CROSSMATCH)

## 2015-01-27 LAB — POCT HEMOGLOBIN: HEMOGLOBIN: 6.8 g/dL — AB (ref 12.2–16.2)

## 2015-01-27 LAB — POC OCCULT BLOOD, ED: FECAL OCCULT BLD: POSITIVE — AB

## 2015-01-27 LAB — PROTIME-INR
INR: 1.08 (ref 0.00–1.49)
Prothrombin Time: 14.2 seconds (ref 11.6–15.2)

## 2015-01-27 LAB — ABO/RH: ABO/RH(D): A POS

## 2015-01-27 MED ORDER — SODIUM CHLORIDE 0.9 % IV BOLUS (SEPSIS)
1000.0000 mL | Freq: Once | INTRAVENOUS | Status: AC
  Administered 2015-01-27: 1000 mL via INTRAVENOUS

## 2015-01-27 MED ORDER — LEVOTHYROXINE SODIUM 75 MCG PO TABS
75.0000 ug | ORAL_TABLET | Freq: Every day | ORAL | Status: DC
  Administered 2015-01-28 – 2015-01-30 (×3): 75 ug via ORAL
  Filled 2015-01-27 (×3): qty 1

## 2015-01-27 MED ORDER — SODIUM CHLORIDE 0.9 % IV SOLN: 10.0000 mL/h | Freq: Once | INTRAVENOUS | Status: DC

## 2015-01-27 MED ORDER — ACETAMINOPHEN 650 MG RE SUPP: 650.0000 mg | Freq: Four times a day (QID) | RECTAL | Status: DC | PRN

## 2015-01-27 MED ORDER — PANTOPRAZOLE SODIUM 40 MG IV SOLR
40.0000 mg | Freq: Once | INTRAVENOUS | Status: AC
  Administered 2015-01-27: 40 mg via INTRAVENOUS
  Filled 2015-01-27: qty 40

## 2015-01-27 MED ORDER — ONDANSETRON HCL 4 MG PO TABS: 4.0000 mg | ORAL_TABLET | Freq: Four times a day (QID) | ORAL | Status: DC | PRN

## 2015-01-27 MED ORDER — MOMETASONE FURO-FORMOTEROL FUM 100-5 MCG/ACT IN AERO
2.0000 | INHALATION_SPRAY | Freq: Two times a day (BID) | RESPIRATORY_TRACT | Status: DC
  Filled 2015-01-27: qty 8.8

## 2015-01-27 MED ORDER — MORPHINE SULFATE (PF) 2 MG/ML IV SOLN: 1.0000 mg | INTRAVENOUS | Status: DC | PRN

## 2015-01-27 MED ORDER — SODIUM CHLORIDE 0.9 % IJ SOLN
3.0000 mL | Freq: Two times a day (BID) | INTRAMUSCULAR | Status: DC
  Administered 2015-01-28 – 2015-01-30 (×4): 3 mL via INTRAVENOUS

## 2015-01-27 MED ORDER — ACETAMINOPHEN 325 MG PO TABS
650.0000 mg | ORAL_TABLET | Freq: Four times a day (QID) | ORAL | Status: DC | PRN
  Administered 2015-01-28 (×2): 650 mg via ORAL
  Filled 2015-01-27 (×2): qty 2

## 2015-01-27 MED ORDER — ALBUTEROL SULFATE (2.5 MG/3ML) 0.083% IN NEBU: 2.5000 mg | INHALATION_SOLUTION | RESPIRATORY_TRACT | Status: DC | PRN

## 2015-01-27 MED ORDER — PANTOPRAZOLE SODIUM 40 MG IV SOLR
40.0000 mg | Freq: Two times a day (BID) | INTRAVENOUS | Status: AC
  Administered 2015-01-28 – 2015-01-29 (×4): 40 mg via INTRAVENOUS
  Filled 2015-01-27 (×4): qty 40

## 2015-01-27 MED ORDER — SODIUM CHLORIDE 0.9 % IV SOLN: INTRAVENOUS | Status: DC

## 2015-01-27 MED ORDER — ONDANSETRON HCL 4 MG/2ML IJ SOLN: 4.0000 mg | Freq: Four times a day (QID) | INTRAMUSCULAR | Status: DC | PRN

## 2015-01-27 NOTE — H&P (Signed)
[facility] History and Physical  Cheryl Perry QBH:[id number] DOB: 02-24-1945 DOA: 01/27/2015   PCP: [name], FNP  Specialists: Dr. Earlean Shawl is her gastroenterologist  Chief Complaint: Dark stool for 3 days  HPI: Cheryl Perry is a 70 y.o. female with a past medical history of asthma, hypothyroidism, chronic low back pain who was in her usual state of health till about 3 days ago when she started developing dark tarry stools. Has some nausea but no vomiting. No abdominal pain, but felt full in her abdomen. Denies any bloody emesis. Felt a little dizzy when getting up from a lying position but it did not last very long. Some shortness of breath with exertion over the last 2 days, but denies any chest pain. She has never had upper endoscopy. She does not take pain medications on a regular basis. She takes muscle relaxants occasionally for her back pain. She does admit to a history of GERD, but not on a regular basis and usually relieved with Tums and takes Prilosec occasionally. She does get the periodic colonoscopies due to high risk of colon cancer based on previous colonoscopies and adenomatous polyps. Last colonoscopy was in December 2015.  Home Medications: Prior to Admission medications   Medication Sig Start Date End Date Taking? Authorizing Provider  aspirin EC 81 MG tablet Take 81 mg by mouth daily.   Yes Historical Provider, MD  Biotin 300 MCG TABS Take 300 mcg by mouth daily.    Yes Historical Provider, MD  Cholecalciferol (VITAMIN D) 2000 UNITS CAPS Take 1 capsule by mouth daily.   Yes Historical Provider, MD  fluticasone-salmeterol (ADVAIR HFA) 45-21 MCG/ACT inhaler Inhale 2 puffs into the lungs 2 (two) times daily. 12/14/14  Yes Sharion Balloon, FNP  levothyroxine (SYNTHROID, LEVOTHROID) 75 MCG tablet TAKE 1 TABLET (75 MCG TOTAL)  BY MOUTH DAILY BEFORE BREAKFAST. 12/14/14  Yes Sharion Balloon, FNP  loratadine (CLARITIN) 10 MG tablet Take 10 mg by mouth daily.   Yes  Historical Provider, MD  montelukast (SINGULAIR) 10 MG tablet Take 10 mg by mouth at bedtime.   Yes Historical Provider, MD  Niacin-Lovastatin (ADVICOR) 1000-40 MG TB24 Take 1 tablet by mouth at bedtime. 06/15/14  Yes Sharion Balloon, FNP  Probiotic Product (PROBIOTIC ADVANCED PO) Take 1 capsule by mouth daily.    Yes Historical Provider, MD  albuterol (PROVENTIL HFA) 108 (90 BASE) MCG/ACT inhaler Inhale 2 puffs into the lungs every 6 (six) hours as needed for wheezing. May use only 3 times a month    Historical Provider, MD  hyoscyamine (LEVSIN, ANASPAZ) 0.125 MG tablet Take 0.125 mg by mouth every 4 (four) hours as needed for cramping.     Historical Provider, MD  methocarbamol (ROBAXIN) 500 MG tablet Take 1 tablet (500 mg total) by mouth 3 (three) times daily. Patient taking differently: Take 500 mg by mouth every 8 (eight) hours as needed for muscle spasms.  12/14/14   Sharion Balloon, FNP    Allergies:  Allergies  Allergen Reactions  . Naproxen Hives and Itching  . Ampicillin     rash  . Avelox [Moxifloxacin Hcl In Nacl]     Rash   . Bactrim [Sulfamethoxazole-Trimethoprim]     Rash   . Levaquin [Levofloxacin In D5w]     Rash   . Lipitor [Atorvastatin]     mylagias  . Macrodantin [Nitrofurantoin Macrocrystal]     Rash   . Premarin [Conjugated Estrogens]     Itching tightness in  chest  . Trental [Pentoxifylline Er]     Rash     Past Medical History: Past Medical History  Diagnosis Date  . Thyroid disease   . Hyperlipidemia   . Allergy   . Asthma   . Vasculopathy     lymphatic  . Back pain     Past Surgical History  Procedure Laterality Date  . Tubal ligation    . Knee surgery    . Cystscopy    . Cystoscopy      Social History: She lives in [place] with her husband. There are no smoking, alcohol use. No illicit drug use. Usually independent with daily activities.  Family History:  Family History  Problem Relation Age of Onset  . Stroke Mother       Review of Systems - History obtained from the patient General ROS: positive for  - fatigue Psychological ROS: negative Ophthalmic ROS: negative ENT ROS: negative Allergy and Immunology ROS: negative Hematological and Lymphatic ROS: negative Endocrine ROS: negative Respiratory ROS: as in hpi Cardiovascular ROS: as in hpi Gastrointestinal ROS: as in hpi Genito-Urinary ROS: no dysuria, trouble voiding, or hematuria Musculoskeletal ROS: low back pain, chronic Neurological ROS: no TIA or stroke symptoms Dermatological ROS: negative  Physical Examination  Filed Vitals:   01/27/15 1649 01/27/15 1730 01/27/15 1847 01/27/15 1931  BP: 122/79 119/103 107/76 105/49  Pulse: 88 78 82 84  Temp:      TempSrc:      Resp: _0 Height:      Weight:      SpO2: 98% 100% 100% 100%    BP 105/49 mmHg  Pulse 84  Temp(Src) 98.7 F (37.1 C) (Temporal)  Resp 20  Ht _1  (1.676 m)  Wt 79.833 kg (176 lb)  BMI 28.42 kg/m2  SpO2 100%  General appearance: alert, cooperative, appears stated age and no distress Head: Normocephalic, without obvious abnormality, atraumatic Eyes: She has pallor. No icterus. Extraocular movements are intact. Throat: lips, mucosa, and tongue normal; teeth and gums normal Neck: no adenopathy, no carotid bruit, no JVD, supple, symmetrical, trachea midline and thyroid not enlarged, symmetric, no tenderness/mass/nodules Resp: clear to auscultation bilaterally Cardio: regular rate and rhythm, S1, S2 normal, no murmur, click, rub or gallop GI: Abdomen is soft. Slightly tender in the epigastric area without any rebound, rigidity or guarding. No masses or organomegaly. Bowel sounds are present. Extremities: extremities normal, atraumatic, no cyanosis or edema Pulses: 2+ and symmetric Skin: Skin color, texture, turgor normal. No rashes or lesions Lymph nodes: Cervical, supraclavicular, and axillary nodes normal. Neurologic: Alert and oriented 3. Cranial nerves II-12  intact. Motor strength equal bilateral upper and lower extremities.  Laboratory Data: Results for orders placed or performed during the hospital encounter of 01/27/15 (from the past 48 hour(s))  CBC with Differential/Platelet     Status: Abnormal   Collection Time: 01/27/15  5:00 PM  Result Value Ref Range   WBC 6.7 4.0 - 10.5 K/uL   RBC 2.18 (L) 3.87 - 5.11 MIL/uL   Hemoglobin 7.3 (L) 12.0 - 15.0 g/dL   HCT 22.1 (L) 36.0 - 46.0 %   MCV 101.4 (H) 78.0 - 100.0 fL   MCH 33.5 26.0 - 34.0 pg   MCHC 33.0 30.0 - 36.0 g/dL   RDW 13.9 11.5 - 15.5 %   Platelets 198 150 - 400 K/uL   Neutrophils Relative % 63 %   Neutro Abs 4.3 1.7 - 7.7 K/uL   Lymphocytes Relative  24 %   Lymphs Abs 1.6 0.7 - 4.0 K/uL   Monocytes Relative 7 %   Monocytes Absolute 0.4 0.1 - 1.0 K/uL   Eosinophils Relative 5 %   Eosinophils Absolute 0.3 0.0 - 0.7 K/uL   Basophils Relative 1 %   Basophils Absolute 0.1 0.0 - 0.1 K/uL  Reticulocytes     Status: Abnormal   Collection Time: 01/27/15  5:00 PM  Result Value Ref Range   Retic Ct Pct 5.8 (H) 0.4 - 3.1 %   RBC. 2.18 (L) 3.87 - 5.11 MIL/uL   Retic Count, Manual 126.4 19.0 - 186.0 K/uL  Type and screen     Status: None (Preliminary result)   Collection Time: 01/27/15  5:45 PM  Result Value Ref Range   ABO/RH(D) A POS    Antibody Screen NEG    Sample Expiration 01/30/2015    Unit Number [id number]    Blood Component Type RED CELLS,LR    Unit division 00    Status of Unit ALLOCATED    Transfusion Status OK TO TRANSFUSE    Crossmatch Result Compatible    Unit Number [id number]    Blood Component Type RED CELLS,LR    Unit division 00    Status of Unit ALLOCATED    Transfusion Status OK TO TRANSFUSE    Crossmatch Result Compatible   Prepare RBC     Status: None   Collection Time: 01/27/15  5:45 PM  Result Value Ref Range   Order Confirmation ORDER PROCESSED BY BLOOD BANK   ABO/Rh     Status: None   Collection Time: 01/27/15  5:45 PM  Result Value Ref  Range   ABO/RH(D) A POS   Comprehensive metabolic panel     Status: Abnormal   Collection Time: 01/27/15  5:50 PM  Result Value Ref Range   Sodium 142 135 - 145 mmol/L   Potassium 3.6 3.5 - 5.1 mmol/L   Chloride 113 (H) 101 - 111 mmol/L   CO2 26 22 - 32 mmol/L   Glucose, Bld 111 (H) 65 - 99 mg/dL   BUN 20 6 - 20 mg/dL   Creatinine, Ser 0.69 0.44 - 1.00 mg/dL   Calcium 8.0 (L) 8.9 - 10.3 mg/dL   Total Protein 4.7 (L) 6.5 - 8.1 g/dL   Albumin 3.0 (L) 3.5 - 5.0 g/dL   AST 24 15 - 41 U/L   ALT 18 14 - 54 U/L   Alkaline Phosphatase 43 38 - 126 U/L   Total Bilirubin 0.3 0.3 - 1.2 mg/dL   GFR calc non Af Amer >60 >60 mL/min   GFR calc Af Amer >60 >60 mL/min    Comment: (NOTE) The eGFR has been calculated using the CKD EPI equation. This calculation has not been validated in all clinical situations. eGFR's persistently <60 mL/min signify possible Chronic Kidney Disease.    Anion gap 3 (L) 5 - 15  POC occult blood, ED     Status: Abnormal   Collection Time: 01/27/15  6:10 PM  Result Value Ref Range   Fecal Occult Bld POSITIVE (A) NEGATIVE    Radiology Reports: No results found.    Problem List  Principal Problem:   UGI bleed Active Problems:   Hypothyroidism   Asthma   GERD (gastroesophageal reflux disease)   Acute blood loss anemia   Assessment: This is a 70 year old Caucasian female who presents with dark and tarry stools and is found to have severe anemia. Patient is most likely experiencing  some form of upper GI bleed. No hematemesis has been present. She has anemia due to acute blood loss.  Plan: #1 Upper GI bleed presenting as melena: She'll be admitted to the hospital. She is hemodynamically stable. Etiology is unclear. Differential diagnosis is broad and includes peptic ulcer disease, gastritis, AVMs, or tumors. Discussed with the Dr. Gala Romney with gastroenterology. He will consult on her tomorrow. She will need upper endoscopy. PPI will be initiated. She'll be kept  nothing by mouth for now. Check PT/INR.  #2 acute blood loss anemia: Transfusion of blood has been ordered by the ED physician. Agree with this. Repeat CBC posttransfusion.  #3 history of hypothyroidism: Continue Synthroid.  #4 history of asthma: Continue with her inhalers.   #5 history of chronic low back pain due to herniated disc: This is a chronic issue for her. No neurological deficits noted at this time.   DVT Prophylaxis: SCDs Code Status: Full code Family Communication:  Discussed with the patient. Disposition Plan: Admit to telemetry   Further management decisions will depend on results of further testing and patient's response to treatment.   [facility]  [facility] Pager [phone]  If 7PM-7AM, please contact night-coverage [email or web address] Password [facility]  01/27/2015, 7:36 PM

## 2015-01-27 NOTE — ED Notes (Signed)
Pt sent over from pcp for dark, tarry stools and a hgb of 6.8.  Pt reports mild abdominal pain.

## 2015-01-27 NOTE — Patient Instructions (Signed)

## 2015-01-27 NOTE — ED Provider Notes (Signed)
CSN: [id number]     Arrival date & time 01/27/15  1610 History   First MD Initiated Contact with Patient 01/27/15 1620     Chief Complaint  Patient presents with  . Abnormal Lab     (Consider location/radiation/quality/duration/timing/severity/associated sxs/prior Treatment) Patient is a 70 y.o. female presenting with hematochezia. The history is provided by the patient (Patient complains of black stools) for a few days. No vomiting).  Rectal Bleeding Quality:  Black and tarry Amount:  Moderate Timing:  Intermittent Progression:  Waxing and waning Chronicity:  New Context: not anal fissures   Associated symptoms: no abdominal pain     Past Medical History  Diagnosis Date  . Thyroid disease   . Hyperlipidemia   . Allergy   . Asthma   . Vasculopathy     lymphatic   Past Surgical History  Procedure Laterality Date  . Tubal ligation    . Knee surgery    . Cystscopy    . Cystoscopy     Family History  Problem Relation Age of Onset  . Stroke Mother    Social History  Substance Use Topics  . Smoking status: Former Smoker    Quit date: 06/06/1974  . Smokeless tobacco: None  . Alcohol Use: No   OB History    No data available     Review of Systems  Constitutional: Negative for appetite change and fatigue.  HENT: Negative for congestion, ear discharge and sinus pressure.   Eyes: Negative for discharge.  Respiratory: Negative for cough.   Cardiovascular: Negative for chest pain.  Gastrointestinal: Positive for blood in stool and hematochezia. Negative for abdominal pain and diarrhea.  Genitourinary: Negative for frequency and hematuria.  Musculoskeletal: Negative for back pain.  Skin: Negative for rash.  Neurological: Negative for seizures and headaches.  Psychiatric/Behavioral: Negative for hallucinations.      Allergies  Naproxen; Ampicillin; Avelox; Bactrim; Levaquin; Lipitor; Macrodantin; Premarin; and Trental  Home Medications   Prior to Admission  medications   Medication Sig Start Date End Date Taking? Authorizing Provider  aspirin EC 81 MG tablet Take 81 mg by mouth daily.   Yes Historical Provider, MD  Biotin 300 MCG TABS Take 300 mcg by mouth daily.    Yes Historical Provider, MD  Cholecalciferol (VITAMIN D) 2000 UNITS CAPS Take 1 capsule by mouth daily.   Yes Historical Provider, MD  fluticasone-salmeterol (ADVAIR HFA) 45-21 MCG/ACT inhaler Inhale 2 puffs into the lungs 2 (two) times daily. 12/14/14  Yes Sharion Balloon, FNP  levothyroxine (SYNTHROID, LEVOTHROID) 75 MCG tablet TAKE 1 TABLET (75 MCG TOTAL)  BY MOUTH DAILY BEFORE BREAKFAST. 12/14/14  Yes Sharion Balloon, FNP  loratadine (CLARITIN) 10 MG tablet Take 10 mg by mouth daily.   Yes Historical Provider, MD  montelukast (SINGULAIR) 10 MG tablet Take 10 mg by mouth at bedtime.   Yes Historical Provider, MD  Niacin-Lovastatin (ADVICOR) 1000-40 MG TB24 Take 1 tablet by mouth at bedtime. 06/15/14  Yes Sharion Balloon, FNP  Probiotic Product (PROBIOTIC ADVANCED PO) Take 1 capsule by mouth daily.    Yes Historical Provider, MD  albuterol (PROVENTIL HFA) 108 (90 BASE) MCG/ACT inhaler Inhale 2 puffs into the lungs every 6 (six) hours as needed for wheezing. May use only 3 times a month    Historical Provider, MD  hyoscyamine (LEVSIN, ANASPAZ) 0.125 MG tablet Take 0.125 mg by mouth every 4 (four) hours as needed for cramping.     Historical Provider, MD  methocarbamol (ROBAXIN)  500 MG tablet Take 1 tablet (500 mg total) by mouth 3 (three) times daily. Patient taking differently: Take 500 mg by mouth every 8 (eight) hours as needed for muscle spasms.  12/14/14   Christy A Hawks, FNP   BP 107/76 mmHg  Pulse 82  Temp(Src) 98.7 F (37.1 C) (Temporal)  Resp 18  Ht '5\' 6"'$  (1.676 m)  Wt 176 lb (79.833 kg)  BMI 28.42 kg/m2  SpO2 100% Physical Exam  Constitutional: She is oriented to person, place, and time. She appears well-developed.  HENT:  Head: Normocephalic.  Eyes: Conjunctivae and EOM  are normal. No scleral icterus.  Neck: Neck supple. No thyromegaly present.  Cardiovascular: Normal rate and regular rhythm.  Exam reveals no gallop and no friction rub.   No murmur heard. Pulmonary/Chest: No stridor. She has no wheezes. She has no rales. She exhibits no tenderness.  Abdominal: She exhibits no distension. There is no tenderness. There is no rebound.  Genitourinary:  Rectal exam shows black stool heme-positive  Musculoskeletal: Normal range of motion. She exhibits no edema.  Lymphadenopathy:    She has no cervical adenopathy.  Neurological: She is oriented to person, place, and time. She exhibits normal muscle tone. Coordination normal.  Skin: No rash noted. No erythema.  Psychiatric: She has a normal mood and affect. Her behavior is normal.    ED Course  Procedures (including critical care time) Labs Review Labs Reviewed  CBC WITH DIFFERENTIAL/PLATELET - Abnormal; Notable for the following:    RBC 2.18 (*)    Hemoglobin 7.3 (*)    HCT 22.1 (*)    MCV 101.4 (*)    All other components within normal limits  COMPREHENSIVE METABOLIC PANEL - Abnormal; Notable for the following:    Chloride 113 (*)    Glucose, Bld 111 (*)    Calcium 8.0 (*)    Total Protein 4.7 (*)    Albumin 3.0 (*)    Anion gap 3 (*)    All other components within normal limits  RETICULOCYTES - Abnormal; Notable for the following:    Retic Ct Pct 5.8 (*)    RBC. 2.18 (*)    All other components within normal limits  POC OCCULT BLOOD, ED - Abnormal; Notable for the following:    Fecal Occult Bld POSITIVE (*)    All other components within normal limits  VITAMIN B12  FOLATE  IRON AND TIBC  FERRITIN  TYPE AND SCREEN  PREPARE RBC (CROSSMATCH)  ABO/RH    Imaging Review No results found. I have personally reviewed and evaluated these images and lab results as part of my medical decision-making.   EKG Interpretation None     CRITICAL CARE Performed by: Jayvin Hurrell L Total critical  care time: 40 minutes Critical care time was exclusive of separately billable procedures and treating other patients. Critical care was necessary to treat or prevent imminent or life-threatening deterioration. Critical care was time spent personally by me on the following activities: development of treatment plan with patient and/or surrogate as well as nursing, discussions with consultants, evaluation of patient's response to treatment, examination of patient, obtaining history from patient or surrogate, ordering and performing treatments and interventions, ordering and review of laboratory studies, ordering and review of radiographic studies, pulse oximetry and re-evaluation of patient's condition.  MDM   Final diagnoses:  Acute GI bleeding    Patient is upper GI bleed she will be admitted to medicine   Milton Ferguson, MD 01/27/15 1904

## 2015-01-27 NOTE — ED Notes (Signed)
Lab at bedside to redraw due to hemolysis

## 2015-01-27 NOTE — ED Notes (Signed)
Lab called blood is ready for patient. Hospitalist in room w/ pt at present. Waiting to call report.

## 2015-01-27 NOTE — Progress Notes (Signed)
   Subjective:    Patient ID: Cheryl Perry, female    DOB: Jul 07, 1945, 70 y.o.   MRN: [id number]  HPI Pt presents to the office today with dark tarry stools for the last three days. PT states she thought it had been related to something she ate, but it continues to be dark and tarry. PT states she has not taken her aspirin in the last 4 days. PT states her stomach "doesn't feel right" and "burns". Pt states she has taken tums and continues Prilosec without relief. PT states she is having back pain and going into her left leg. PT states she has sciatic problems and believes this is her back pain and not related to the stools. Pt denies any headache, SOB, or edema at this time. PT states she is having palliations at times,  feels anxious, and fatigued.      Review of Systems  Constitutional: Negative.   HENT: Negative.   Eyes: Negative.   Respiratory: Negative.  Negative for shortness of breath.   Cardiovascular: Negative.  Negative for palpitations.  Gastrointestinal: Negative.   Endocrine: Negative.   Genitourinary: Negative.   Musculoskeletal: Negative.   Neurological: Negative.  Negative for headaches.  Hematological: Negative.   Psychiatric/Behavioral: Negative.   All other systems reviewed and are negative.      Objective:   Physical Exam  Constitutional: She is oriented to person, place, and time. She appears well-developed and well-nourished. No distress.  HENT:  Head: Normocephalic and atraumatic.  Eyes: Pupils are equal, round, and reactive to light.  Neck: Normal range of motion. Neck supple. No thyromegaly present.  Cardiovascular: Normal rate, regular rhythm, normal heart sounds and intact distal pulses.   No murmur heard. Pulmonary/Chest: Effort normal and breath sounds normal. No respiratory distress. She has no wheezes.  Abdominal: Soft. Bowel sounds are normal. She exhibits no distension. There is tenderness (mild RUQ ).  Musculoskeletal: Normal range of motion.  She exhibits no edema or tenderness.  Neurological: She is alert and oriented to person, place, and time. She has normal reflexes. No cranial nerve deficit.  Skin: Skin is warm and dry. There is pallor.  Psychiatric: She has a normal mood and affect. Her behavior is normal. Judgment and thought content normal.  Vitals reviewed.   Results for orders placed or performed in visit on 01/27/15  POCT hemoglobin  Result Value Ref Range   Hemoglobin 6.8 (A) 12.2 - 16.2 g/dL     BP 113/69 mmHg  Pulse 88  Temp(Src) 97.2 F (36.2 C) (Oral)  Ht '5\' 5"'$  (1.651 m)  Wt 176 lb 12.8 oz (80.196 kg)  BMI 29.42 kg/m2     Assessment & Plan:  1. Dark stools - Anemia Profile B - CMP14+EGFR - POCT hemoglobin  2. Gastrointestinal hemorrhage, unspecified gastritis, unspecified gastrointestinal hemorrhage type  PT told to go to ED right now. PT states she does want EMS called and states her husband will drive her Labs pending   [name], FNP

## 2015-01-28 ENCOUNTER — Encounter (HOSPITAL_COMMUNITY): Payer: Self-pay | Admitting: *Deleted

## 2015-01-28 ENCOUNTER — Encounter (HOSPITAL_COMMUNITY)
Admission: EM | Disposition: A | Discharge status: Home / Self Care | Payer: Self-pay | Source: Home / Self Care | Attending: Internal Medicine

## 2015-01-28 DIAGNOSIS — K219 Gastro-esophageal reflux disease without esophagitis: Secondary | ICD-10-CM

## 2015-01-28 DIAGNOSIS — Z8719 Personal history of other diseases of the digestive system: Secondary | ICD-10-CM | POA: Insufficient documentation

## 2015-01-28 DIAGNOSIS — K922 Gastrointestinal hemorrhage, unspecified: Secondary | ICD-10-CM

## 2015-01-28 DIAGNOSIS — D62 Acute posthemorrhagic anemia: Secondary | ICD-10-CM

## 2015-01-28 DIAGNOSIS — J45909 Unspecified asthma, uncomplicated: Secondary | ICD-10-CM

## 2015-01-28 DIAGNOSIS — E038 Other specified hypothyroidism: Secondary | ICD-10-CM

## 2015-01-28 HISTORY — PX: ESOPHAGOGASTRODUODENOSCOPY: SHX5428

## 2015-01-28 LAB — TYPE AND SCREEN
ABO/RH(D): A POS
Antibody Screen: NEGATIVE
Unit division: 0
Unit division: 0

## 2015-01-28 LAB — ANEMIA PROFILE B
Basophils Absolute: 0 10*3/uL (ref 0.0–0.2)
Basos: 1 %
EOS (ABSOLUTE): 0.3 10*3/uL (ref 0.0–0.4)
Eos: 4 %
FERRITIN: 46 ng/mL (ref 15–150)
Folate: 13.1 ng/mL (ref >3.0)
HEMOGLOBIN: 7.2 g/dL — AB (ref 11.1–15.9)
Hematocrit: 20.5 % — ABNORMAL LOW (ref 34.0–46.6)
IMMATURE GRANS (ABS): 0 10*3/uL (ref 0.0–0.1)
IMMATURE GRANULOCYTES: 0 %
Iron Saturation: 19 % (ref 15–55)
Iron: 50 ug/dL (ref 27–139)
LYMPHS: 32 %
Lymphocytes Absolute: 2.2 10*3/uL (ref 0.7–3.1)
MCH: 34 pg — ABNORMAL HIGH (ref 26.6–33.0)
MCHC: 35.1 g/dL (ref 31.5–35.7)
MCV: 97 fL (ref 79–97)
MONOS ABS: 0.5 10*3/uL (ref 0.1–0.9)
Monocytes: 7 %
NEUTROS PCT: 56 %
Neutrophils Absolute: 3.7 10*3/uL (ref 1.4–7.0)
Platelets: 187 10*3/uL (ref 150–379)
RBC: 2.12 x10E6/uL — AB (ref 3.77–5.28)
RDW: 14.1 % (ref 12.3–15.4)
Retic Ct Pct: 5.4 % — ABNORMAL HIGH (ref 0.6–2.6)
Total Iron Binding Capacity: 257 ug/dL (ref 250–450)
UIBC: 207 ug/dL (ref 118–369)
VITAMIN B 12: 381 pg/mL (ref 211–946)
WBC: 6.7 10*3/uL (ref 3.4–10.8)

## 2015-01-28 LAB — COMPREHENSIVE METABOLIC PANEL
ALBUMIN: 3 g/dL — AB (ref 3.5–5.0)
ALK PHOS: 41 U/L (ref 38–126)
ALT: 16 U/L (ref 14–54)
AST: 24 U/L (ref 15–41)
Anion gap: 3 — ABNORMAL LOW (ref 5–15)
BILIRUBIN TOTAL: 0.9 mg/dL (ref 0.3–1.2)
BUN: 12 mg/dL (ref 6–20)
CALCIUM: 8.3 mg/dL — AB (ref 8.9–10.3)
CO2: 26 mmol/L (ref 22–32)
Chloride: 114 mmol/L — ABNORMAL HIGH (ref 101–111)
Creatinine, Ser: 0.66 mg/dL (ref 0.44–1.00)
GFR calc Af Amer: >60 mL/min (ref >60)
GFR calc non Af Amer: >60 mL/min (ref >60)
GLUCOSE: 106 mg/dL — AB (ref 65–99)
POTASSIUM: 3.6 mmol/L (ref 3.5–5.1)
Sodium: 143 mmol/L (ref 135–145)
TOTAL PROTEIN: 4.6 g/dL — AB (ref 6.5–8.1)

## 2015-01-28 LAB — CMP14+EGFR
ALT: 16 IU/L (ref 0–32)
AST: 24 IU/L (ref 0–40)
Albumin/Globulin Ratio: 2.3 (ref 1.1–2.5)
Albumin: 3.4 g/dL — ABNORMAL LOW (ref 3.6–4.8)
Alkaline Phosphatase: 51 IU/L (ref 39–117)
BUN/Creatinine Ratio: 26 (ref 11–26)
BUN: 18 mg/dL (ref 8–27)
Bilirubin Total: <0.2 mg/dL (ref 0.0–1.2)
CO2: 24 mmol/L (ref 18–29)
Calcium: 8.2 mg/dL — ABNORMAL LOW (ref 8.7–10.3)
Chloride: 108 mmol/L — ABNORMAL HIGH (ref 96–106)
Creatinine, Ser: 0.69 mg/dL (ref 0.57–1.00)
GFR calc Af Amer: 103 mL/min/{1.73_m2} (ref >59)
GFR calc non Af Amer: 89 mL/min/{1.73_m2} (ref >59)
Globulin, Total: 1.5 g/dL (ref 1.5–4.5)
Glucose: 102 mg/dL — ABNORMAL HIGH (ref 65–99)
Potassium: 4 mmol/L (ref 3.5–5.2)
Sodium: 143 mmol/L (ref 134–144)
Total Protein: 4.9 g/dL — ABNORMAL LOW (ref 6.0–8.5)

## 2015-01-28 LAB — CBC
HEMATOCRIT: 25.5 % — AB (ref 36.0–46.0)
HEMOGLOBIN: 8.7 g/dL — AB (ref 12.0–15.0)
MCH: 32.8 pg (ref 26.0–34.0)
MCHC: 34.1 g/dL (ref 30.0–36.0)
MCV: 96.2 fL (ref 78.0–100.0)
Platelets: 165 10*3/uL (ref 150–400)
RBC: 2.65 MIL/uL — ABNORMAL LOW (ref 3.87–5.11)
RDW: 18.4 % — ABNORMAL HIGH (ref 11.5–15.5)
WBC: 6 10*3/uL (ref 4.0–10.5)

## 2015-01-28 SURGERY — EGD (ESOPHAGOGASTRODUODENOSCOPY)
Anesthesia: Moderate Sedation

## 2015-01-28 MED ORDER — MEPERIDINE HCL 100 MG/ML IJ SOLN
INTRAMUSCULAR | Status: DC | PRN
  Administered 2015-01-28: 50 mg via INTRAVENOUS

## 2015-01-28 MED ORDER — MIDAZOLAM HCL 5 MG/5ML IJ SOLN
INTRAMUSCULAR | Status: AC
  Filled 2015-01-28: qty 10

## 2015-01-28 MED ORDER — MEPERIDINE HCL 100 MG/ML IJ SOLN
INTRAMUSCULAR | Status: AC
  Filled 2015-01-28: qty 2

## 2015-01-28 MED ORDER — SODIUM CHLORIDE 0.9 % IV SOLN
INTRAVENOUS | Status: DC
  Administered 2015-01-28: 1000 mL via INTRAVENOUS

## 2015-01-28 MED ORDER — LIDOCAINE VISCOUS 2 % MT SOLN
OROMUCOSAL | Status: AC
  Filled 2015-01-28: qty 15

## 2015-01-28 MED ORDER — SIMETHICONE 40 MG/0.6ML PO SUSP
ORAL | Status: DC | PRN
  Administered 2015-01-28: 12:00:00

## 2015-01-28 MED ORDER — ONDANSETRON HCL 4 MG/2ML IJ SOLN
INTRAMUSCULAR | Status: AC
  Filled 2015-01-28: qty 2

## 2015-01-28 MED ORDER — SODIUM CHLORIDE 0.9 % IJ SOLN
INTRAMUSCULAR | Status: AC
  Filled 2015-01-28: qty 3

## 2015-01-28 MED ORDER — ONDANSETRON HCL 4 MG/2ML IJ SOLN
INTRAMUSCULAR | Status: DC | PRN
  Administered 2015-01-28: 4 mg via INTRAVENOUS

## 2015-01-28 MED ORDER — MIDAZOLAM HCL 5 MG/5ML IJ SOLN
INTRAMUSCULAR | Status: DC | PRN
  Administered 2015-01-28: 1 mg via INTRAVENOUS
  Administered 2015-01-28: 2 mg via INTRAVENOUS
  Administered 2015-01-28: 1 mg via INTRAVENOUS

## 2015-01-28 MED ORDER — LIDOCAINE VISCOUS 2 % MT SOLN
OROMUCOSAL | Status: DC | PRN
  Administered 2015-01-28: 1 via OROMUCOSAL

## 2015-01-28 NOTE — Care Management Note (Signed)
Case Management Note  Patient Details  Name: Cheryl Perry MRN: [id number] Date of Birth: [birth year]  Subjective/Objective:         From home with husband,  Alert oriented, Drives  Self and denies difficulty with home medications.         Action/Plan: home with Self Care.   Expected Discharge Date:                  Expected Discharge Plan:  Home/Self Care  In-House Referral:     Discharge planning Services  CM Consult  Post Acute Care Choice:    Choice offered to:     DME Arranged:    DME Agency:     HH Arranged:    [place] Agency:     Status of Service:  Completed, signed off  Medicare Important Message Given:    Date Medicare IM Given:    Medicare IM give by:    Date Additional Medicare IM Given:    Additional Medicare Important Message give by:     If discussed at [place] of Stay Meetings, dates discussed:    Additional Comments:  Alvie Heidelberg, RN 01/28/2015, 4:41 PM

## 2015-01-28 NOTE — Interval H&P Note (Signed)
History and Physical Interval Note:  01/28/2015 11:25 AM  Cheryl Perry  has presented today for surgery, with the diagnosis of GI bleed, anemia  The various methods of treatment have been discussed with the patient and family. After consideration of risks, benefits and other options for treatment, the patient has consented to  Procedure(s): ESOPHAGOGASTRODUODENOSCOPY (EGD) (N/A) as a surgical intervention .  The patient's history has been reviewed, patient examined, no change in status, stable for surgery.  I have reviewed the patient's chart and labs.  Questions were answered to the patient's satisfaction.     Manus Rudd

## 2015-01-28 NOTE — Consult Note (Signed)
Referring Provider: No ref. provider found Primary Care Physician:  [name], FNP Primary Gastroenterologist:  Dr.   Date of Admission: 01/27/15 Date of Consultation: 01/28/15  Reason for Consultation:  Upper GI bleed, anemia  HPI:  70 year old female with a PMH of hypothyroidism, back pain, asthma who presented to the ED yesterday on referral by her PCP with complaints of dark stools for the previous 3-4 days. Has had a total of 8-10 black stools since then. Also had 1-2 episodes of lightheadedness when standing and dyspnea. Last bowel movement was this morning and was also dark. Denies red blood in stools. Admits some mild right lower abdominal pain, more significant pain in the epigastric area. Admits a history of GERD, but is typically intermittent and has been taking TUMS and PPI 2-3 times in the past week. Takes a daily ASA 81 and Ibuprofen for back pain about 2-3 times a week. Per the patient her hgb was 6.8 at her PCP. On presentation to the ER she was significantly anemia with an H/H of 7.3/22.1. Heme+ stool in the ED as well. She was given 2 units of PRBC and H/H this morning of 8.7/25.5. Last colonoscopy end of 2015 with noted internal hemorrhoids, next due in 5 years. No previous EGD.  Past Medical History  Diagnosis Date  . Thyroid disease   . Hyperlipidemia   . Allergy   . Asthma   . Vasculopathy     lymphatic  . Back pain     Past Surgical History  Procedure Laterality Date  . Tubal ligation    . Knee surgery    . Cystscopy    . Cystoscopy      Prior to Admission medications   Medication Sig Start Date End Date Taking? Authorizing Provider  aspirin EC 81 MG tablet Take 81 mg by mouth daily.   Yes Historical Provider, MD  Biotin 300 MCG TABS Take 300 mcg by mouth daily.    Yes Historical Provider, MD  Cholecalciferol (VITAMIN D) 2000 UNITS CAPS Take 1 capsule by mouth daily.   Yes Historical Provider, MD  fluticasone-salmeterol (ADVAIR HFA) 45-21 MCG/ACT inhaler  Inhale 2 puffs into the lungs 2 (two) times daily. 12/14/14  Yes Sharion Balloon, FNP  levothyroxine (SYNTHROID, LEVOTHROID) 75 MCG tablet TAKE 1 TABLET (75 MCG TOTAL)  BY MOUTH DAILY BEFORE BREAKFAST. 12/14/14  Yes Sharion Balloon, FNP  loratadine (CLARITIN) 10 MG tablet Take 10 mg by mouth daily.   Yes Historical Provider, MD  montelukast (SINGULAIR) 10 MG tablet Take 10 mg by mouth at bedtime.   Yes Historical Provider, MD  Niacin-Lovastatin (ADVICOR) 1000-40 MG TB24 Take 1 tablet by mouth at bedtime. 06/15/14  Yes Sharion Balloon, FNP  Probiotic Product (PROBIOTIC ADVANCED PO) Take 1 capsule by mouth daily.    Yes Historical Provider, MD  albuterol (PROVENTIL HFA) 108 (90 BASE) MCG/ACT inhaler Inhale 2 puffs into the lungs every 6 (six) hours as needed for wheezing. May use only 3 times a month    Historical Provider, MD  hyoscyamine (LEVSIN, ANASPAZ) 0.125 MG tablet Take 0.125 mg by mouth every 4 (four) hours as needed for cramping.     Historical Provider, MD  methocarbamol (ROBAXIN) 500 MG tablet Take 1 tablet (500 mg total) by mouth 3 (three) times daily. Patient taking differently: Take 500 mg by mouth every 8 (eight) hours as needed for muscle spasms.  12/14/14   Sharion Balloon, FNP    Current Facility-Administered Medications  Medication Dose Route Frequency Provider Last Rate Last Dose  . 0.9 %  sodium chloride infusion   Intravenous Continuous Bonnielee Haff, MD 50 mL/hr at 01/27/15 2030    . acetaminophen (TYLENOL) tablet 650 mg  650 mg Oral Q6H PRN Bonnielee Haff, MD       Or  . acetaminophen (TYLENOL) suppository 650 mg  650 mg Rectal Q6H PRN Bonnielee Haff, MD      . albuterol (PROVENTIL) (2.5 MG/3ML) 0.083% nebulizer solution 2.5 mg  2.5 mg Nebulization Q2H PRN Bonnielee Haff, MD      . levothyroxine (SYNTHROID, LEVOTHROID) tablet 75 mcg  75 mcg Oral QAC breakfast Bonnielee Haff, MD   75 mcg at 01/28/15 0819  . mometasone-formoterol (DULERA) 100-5 MCG/ACT inhaler 2 puff  2 puff  Inhalation BID Bonnielee Haff, MD   2 puff at 01/27/15 2030  . morphine 2 MG/ML injection 1 mg  1 mg Intravenous Q4H PRN Bonnielee Haff, MD      . ondansetron (ZOFRAN) tablet 4 mg  4 mg Oral Q6H PRN Bonnielee Haff, MD       Or  . ondansetron (ZOFRAN) injection 4 mg  4 mg Intravenous Q6H PRN Bonnielee Haff, MD      . pantoprazole (PROTONIX) injection 40 mg  40 mg Intravenous Q12H Bonnielee Haff, MD   40 mg at 01/28/15 0500  . sodium chloride 0.9 % injection 3 mL  3 mL Intravenous Q12H Bonnielee Haff, MD   3 mL at 01/27/15 2200    Allergies as of 01/27/2015 - Review Complete 01/27/2015  Allergen Reaction Noted  . Naproxen Hives and Itching 09/02/2013  . Ampicillin  06/05/2012  . Avelox [moxifloxacin hcl in nacl]  06/05/2012  . Bactrim [sulfamethoxazole-trimethoprim]  06/05/2012  . Levaquin [levofloxacin in d5w]  06/05/2012  . Lipitor [atorvastatin]  06/05/2012  . Macrodantin [nitrofurantoin macrocrystal]  06/05/2012  . Premarin [conjugated estrogens]  06/05/2012  . Trental [pentoxifylline er]  06/05/2012    Family History  Problem Relation Age of Onset  . Stroke Mother     Social History   Social History  . Marital Status: Married    Spouse Name: N/A  . Number of Children: N/A  . Years of Education: N/A   Occupational History  . Not on file.   Social History Main Topics  . Smoking status: Former Smoker    Quit date: 06/06/1974  . Smokeless tobacco: Not on file  . Alcohol Use: No  . Drug Use: No  . Sexual Activity: Not on file   Other Topics Concern  . Not on file   Social History Narrative    Review of Systems: Gen: Denies fever, chills, loss of appetite, change in weight or weight loss CV: Denies chest pain, heart palpitations, syncope, edema  Resp: Denies shortness of breath with rest, cough, wheezing GI: See HPI.  MS: Admits chronic back pain Derm: Denies rash, itching, dry skin Psych: Denies confusion, or memory loss Heme: Denies bruising,  bleeding.  Physical Exam: Vital signs in last 24 hours: Temp:  [97.2 F (36.2 C)-98.7 F (37.1 C)] 98.1 F (36.7 C) (01/20 0815) Pulse Rate:  [71-97] 71 (01/20 0815) Resp:  [16-20] 18 (01/20 0815) BP: (93-132)/(43-103) 117/58 mmHg (01/20 0815) SpO2:  [96 %-100 %] 96 % (01/20 0815) Weight:  [176 lb (79.833 kg)-176 lb 12.8 oz (80.196 kg)] 176 lb 9.6 oz (80.105 kg) (01/19 2023) Last BM Date: 01/27/15 General:   Alert,  Well-developed, well-nourished, pleasant and cooperative in NAD Head:  Normocephalic and atraumatic. Eyes:  Sclera clear, no icterus. Conjunctiva pink. Ears:  Normal auditory acuity. Lungs:  Clear throughout to auscultation. No wheezes, crackles, or rhonchi. No acute distress. Heart:  Regular rate and rhythm; no murmurs, clicks, rubs,  or gallops. Abdomen:  Soft and nondistended. Minimal RLQ abdominal pain and mild-moderate epigastric TTP. No masses, hepatosplenomegaly or hernias noted. Normal bowel sounds, without guarding, and without rebound.   Rectal:  Deferred.   Extremities:  Without clubbing or edema. Neurologic:  Alert and  oriented x4;  grossly normal neurologically. Psych:  Alert and cooperative. Normal mood and affect.  Intake/Output from previous day: 01/19 0701 - 01/20 0700 In: 1145 [I.V.:475; Blood:670] Out: -  Intake/Output this shift:    Lab Results:  Recent Labs  01/27/15 1447 01/27/15 1457 01/27/15 1700 01/28/15 0632  WBC 6.7  --  6.7 6.0  HGB  --  6.8* 7.3* 8.7*  HCT 20.5*  --  22.1* 25.5*  PLT 187  --  198 165   BMET  Recent Labs  01/27/15 1447 01/27/15 1750 01/28/15 0632  NA 143 142 143  K 4.0 3.6 3.6  CL 108* 113* 114*  CO2 '24 26 26  '$ GLUCOSE 102* 111* 106*  BUN '18 20 12  '$ CREATININE 0.69 0.69 0.66  CALCIUM 8.2* 8.0* 8.3*   LFT  Recent Labs  01/27/15 1447 01/27/15 1750 01/28/15 0632  PROT 4.9* 4.7* 4.6*  ALBUMIN 3.4* 3.0* 3.0*  AST '24 24 24  '$ ALT '16 18 16  '$ ALKPHOS 51 43 41  BILITOT <0.2 0.3 0.9    PT/INR  Recent Labs  01/27/15 1750  LABPROT 14.2  INR 1.08   Hepatitis Panel No results for input(s): HEPBSAG, HCVAB, HEPAIGM, HEPBIGM in the last 72 hours. C-Diff No results for input(s): CDIFFTOX in the last 72 hours.  Studies/Results: No results found.  Impression: 70 year old female with approximately 4 days of dark stools, heme+ in the ER, and a significant drop in h/H. She has already received 2 units PRBC with Hgb increased to 8.7. Still seems to be actively bleeding with another episode of dark stools this morning. Was somewhat symptomatic with her anemia. Colonoscopy up to date. Some epigastric pain with a history of GERD which she feels is about at her normal, takes TUMS and PPI occasionally. Does take regular NSAIDs 2-3 times a week and daily ASA.  Differentials include gastritis with possible NSAID etiology, esophagitia, peptic ulcer disease, AVMs. Less likely.  Plan: 1. Continue NPO 2. EGD today 3. PPI 4. Monitor H/H closely given likely continued bleed 5. Transfuse as necessary 6. Other supportive measures.   Walden Field, AGNP-C Adult & Gerontological Nurse Practitioner [facility]     LOS: 1 day     01/28/2015, 9:05 AM

## 2015-01-28 NOTE — H&P (View-Only) (Signed)
Referring Provider: No ref. provider found Primary Care Physician:  [name], FNP Primary Gastroenterologist:  Dr.   Date of Admission: 01/27/15 Date of Consultation: 01/28/15  Reason for Consultation:  Upper GI bleed, anemia  HPI:  70 year old female with a PMH of hypothyroidism, back pain, asthma who presented to the ED yesterday on referral by her PCP with complaints of dark stools for the previous 3-4 days. Has had a total of 8-10 black stools since then. Also had 1-2 episodes of lightheadedness when standing and dyspnea. Last bowel movement was this morning and was also dark. Denies red blood in stools. Admits some mild right lower abdominal pain, more significant pain in the epigastric area. Admits a history of GERD, but is typically intermittent and has been taking TUMS and PPI 2-3 times in the past week. Takes a daily ASA 81 and Ibuprofen for back pain about 2-3 times a week. Per the patient her hgb was 6.8 at her PCP. On presentation to the ER she was significantly anemia with an H/H of 7.3/22.1. Heme+ stool in the ED as well. She was given 2 units of PRBC and H/H this morning of 8.7/25.5. Last colonoscopy end of 2015 with noted internal hemorrhoids, next due in 5 years. No previous EGD.  Past Medical History  Diagnosis Date  . Thyroid disease   . Hyperlipidemia   . Allergy   . Asthma   . Vasculopathy     lymphatic  . Back pain     Past Surgical History  Procedure Laterality Date  . Tubal ligation    . Knee surgery    . Cystscopy    . Cystoscopy      Prior to Admission medications   Medication Sig Start Date End Date Taking? Authorizing Provider  aspirin EC 81 MG tablet Take 81 mg by mouth daily.   Yes Historical Provider, MD  Biotin 300 MCG TABS Take 300 mcg by mouth daily.    Yes Historical Provider, MD  Cholecalciferol (VITAMIN D) 2000 UNITS CAPS Take 1 capsule by mouth daily.   Yes Historical Provider, MD  fluticasone-salmeterol (ADVAIR HFA) 45-21 MCG/ACT inhaler  Inhale 2 puffs into the lungs 2 (two) times daily. 12/14/14  Yes Sharion Balloon, FNP  levothyroxine (SYNTHROID, LEVOTHROID) 75 MCG tablet TAKE 1 TABLET (75 MCG TOTAL)  BY MOUTH DAILY BEFORE BREAKFAST. 12/14/14  Yes Sharion Balloon, FNP  loratadine (CLARITIN) 10 MG tablet Take 10 mg by mouth daily.   Yes Historical Provider, MD  montelukast (SINGULAIR) 10 MG tablet Take 10 mg by mouth at bedtime.   Yes Historical Provider, MD  Niacin-Lovastatin (ADVICOR) 1000-40 MG TB24 Take 1 tablet by mouth at bedtime. 06/15/14  Yes Sharion Balloon, FNP  Probiotic Product (PROBIOTIC ADVANCED PO) Take 1 capsule by mouth daily.    Yes Historical Provider, MD  albuterol (PROVENTIL HFA) 108 (90 BASE) MCG/ACT inhaler Inhale 2 puffs into the lungs every 6 (six) hours as needed for wheezing. May use only 3 times a month    Historical Provider, MD  hyoscyamine (LEVSIN, ANASPAZ) 0.125 MG tablet Take 0.125 mg by mouth every 4 (four) hours as needed for cramping.     Historical Provider, MD  methocarbamol (ROBAXIN) 500 MG tablet Take 1 tablet (500 mg total) by mouth 3 (three) times daily. Patient taking differently: Take 500 mg by mouth every 8 (eight) hours as needed for muscle spasms.  12/14/14   Sharion Balloon, FNP    Current Facility-Administered Medications  Medication Dose Route Frequency Provider Last Rate Last Dose  . 0.9 %  sodium chloride infusion   Intravenous Continuous Bonnielee Haff, MD 50 mL/hr at 01/27/15 2030    . acetaminophen (TYLENOL) tablet 650 mg  650 mg Oral Q6H PRN Bonnielee Haff, MD       Or  . acetaminophen (TYLENOL) suppository 650 mg  650 mg Rectal Q6H PRN Bonnielee Haff, MD      . albuterol (PROVENTIL) (2.5 MG/3ML) 0.083% nebulizer solution 2.5 mg  2.5 mg Nebulization Q2H PRN Bonnielee Haff, MD      . levothyroxine (SYNTHROID, LEVOTHROID) tablet 75 mcg  75 mcg Oral QAC breakfast Bonnielee Haff, MD   75 mcg at 01/28/15 0819  . mometasone-formoterol (DULERA) 100-5 MCG/ACT inhaler 2 puff  2 puff  Inhalation BID Bonnielee Haff, MD   2 puff at 01/27/15 2030  . morphine 2 MG/ML injection 1 mg  1 mg Intravenous Q4H PRN Bonnielee Haff, MD      . ondansetron (ZOFRAN) tablet 4 mg  4 mg Oral Q6H PRN Bonnielee Haff, MD       Or  . ondansetron (ZOFRAN) injection 4 mg  4 mg Intravenous Q6H PRN Bonnielee Haff, MD      . pantoprazole (PROTONIX) injection 40 mg  40 mg Intravenous Q12H Bonnielee Haff, MD   40 mg at 01/28/15 0500  . sodium chloride 0.9 % injection 3 mL  3 mL Intravenous Q12H Bonnielee Haff, MD   3 mL at 01/27/15 2200    Allergies as of 01/27/2015 - Review Complete 01/27/2015  Allergen Reaction Noted  . Naproxen Hives and Itching 09/02/2013  . Ampicillin  06/05/2012  . Avelox [moxifloxacin hcl in nacl]  06/05/2012  . Bactrim [sulfamethoxazole-trimethoprim]  06/05/2012  . Levaquin [levofloxacin in d5w]  06/05/2012  . Lipitor [atorvastatin]  06/05/2012  . Macrodantin [nitrofurantoin macrocrystal]  06/05/2012  . Premarin [conjugated estrogens]  06/05/2012  . Trental [pentoxifylline er]  06/05/2012    Family History  Problem Relation Age of Onset  . Stroke Mother     Social History   Social History  . Marital Status: Married    Spouse Name: N/A  . Number of Children: N/A  . Years of Education: N/A   Occupational History  . Not on file.   Social History Main Topics  . Smoking status: Former Smoker    Quit date: 06/06/1974  . Smokeless tobacco: Not on file  . Alcohol Use: No  . Drug Use: No  . Sexual Activity: Not on file   Other Topics Concern  . Not on file   Social History Narrative    Review of Systems: Gen: Denies fever, chills, loss of appetite, change in weight or weight loss CV: Denies chest pain, heart palpitations, syncope, edema  Resp: Denies shortness of breath with rest, cough, wheezing GI: See HPI.  MS: Admits chronic back pain Derm: Denies rash, itching, dry skin Psych: Denies confusion, or memory loss Heme: Denies bruising,  bleeding.  Physical Exam: Vital signs in last 24 hours: Temp:  [97.2 F (36.2 C)-98.7 F (37.1 C)] 98.1 F (36.7 C) (01/20 0815) Pulse Rate:  [71-97] 71 (01/20 0815) Resp:  [16-20] 18 (01/20 0815) BP: (93-132)/(43-103) 117/58 mmHg (01/20 0815) SpO2:  [96 %-100 %] 96 % (01/20 0815) Weight:  [176 lb (79.833 kg)-176 lb 12.8 oz (80.196 kg)] 176 lb 9.6 oz (80.105 kg) (01/19 2023) Last BM Date: 01/27/15 General:   Alert,  Well-developed, well-nourished, pleasant and cooperative in NAD Head:  Normocephalic and atraumatic. Eyes:  Sclera clear, no icterus. Conjunctiva pink. Ears:  Normal auditory acuity. Lungs:  Clear throughout to auscultation. No wheezes, crackles, or rhonchi. No acute distress. Heart:  Regular rate and rhythm; no murmurs, clicks, rubs,  or gallops. Abdomen:  Soft and nondistended. Minimal RLQ abdominal pain and mild-moderate epigastric TTP. No masses, hepatosplenomegaly or hernias noted. Normal bowel sounds, without guarding, and without rebound.   Rectal:  Deferred.   Extremities:  Without clubbing or edema. Neurologic:  Alert and  oriented x4;  grossly normal neurologically. Psych:  Alert and cooperative. Normal mood and affect.  Intake/Output from previous day: 01/19 0701 - 01/20 0700 In: 1145 [I.V.:475; Blood:670] Out: -  Intake/Output this shift:    Lab Results:  Recent Labs  01/27/15 1447 01/27/15 1457 01/27/15 1700 01/28/15 0632  WBC 6.7  --  6.7 6.0  HGB  --  6.8* 7.3* 8.7*  HCT 20.5*  --  22.1* 25.5*  PLT 187  --  198 165   BMET  Recent Labs  01/27/15 1447 01/27/15 1750 01/28/15 0632  NA 143 142 143  K 4.0 3.6 3.6  CL 108* 113* 114*  CO2 '24 26 26  '$ GLUCOSE 102* 111* 106*  BUN '18 20 12  '$ CREATININE 0.69 0.69 0.66  CALCIUM 8.2* 8.0* 8.3*   LFT  Recent Labs  01/27/15 1447 01/27/15 1750 01/28/15 0632  PROT 4.9* 4.7* 4.6*  ALBUMIN 3.4* 3.0* 3.0*  AST '24 24 24  '$ ALT '16 18 16  '$ ALKPHOS 51 43 41  BILITOT <0.2 0.3 0.9    PT/INR  Recent Labs  01/27/15 1750  LABPROT 14.2  INR 1.08   Hepatitis Panel No results for input(s): HEPBSAG, HCVAB, HEPAIGM, HEPBIGM in the last 72 hours. C-Diff No results for input(s): CDIFFTOX in the last 72 hours.  Studies/Results: No results found.  Impression: 70 year old female with approximately 4 days of dark stools, heme+ in the ER, and a significant drop in h/H. She has already received 2 units PRBC with Hgb increased to 8.7. Still seems to be actively bleeding with another episode of dark stools this morning. Was somewhat symptomatic with her anemia. Colonoscopy up to date. Some epigastric pain with a history of GERD which she feels is about at her normal, takes TUMS and PPI occasionally. Does take regular NSAIDs 2-3 times a week and daily ASA.  Differentials include gastritis with possible NSAID etiology, esophagitia, peptic ulcer disease, AVMs. Less likely.  Plan: 1. Continue NPO 2. EGD today 3. PPI 4. Monitor H/H closely given likely continued bleed 5. Transfuse as necessary 6. Other supportive measures.   Walden Field, AGNP-C Adult & Gerontological Nurse Practitioner [facility]     LOS: 1 day     01/28/2015, 9:05 AM

## 2015-01-28 NOTE — Progress Notes (Signed)
PROGRESS NOTE  Cheryl Perry HQI:[id number] DOB: 1945/11/07 DOA: 01/27/2015 PCP: [name], FNP  Brief History 70 year old female with a history of asthma, hypothyroidism, and chronic back pain presented with 2-3 day history of dyspnea on exertion, melanotic stool with some nausea. The patient denied any vomiting, chest pain, fevers, chills, abdominal pain, hematochezia, melena. The patient takes approximately 3 ibuprofen per week for her low back pain. The patient also intermittently takes aspirin 81 mg daily. She denies any other over-the-counter medications. The patient had a colonoscopy (Dr. Neville Route) in December 2015 which was negative. She has routine surveillance colonoscopies due to family history of colon cancer and precancerous polyps in the past. The patient has never had an EGD. The patient went to see her primary care provider. Hemoglobin at her PCP office was 6.8. The patient states that she rarely takes over-the-counter omeprazole and Tums for her GERD. She states she takes approximately 2-3 times per week.  As a result, the patient was sent to the hospital for further evaluation. In the ED, repeat hemoglobin was 7.2 with positive FOBT. The patient remained hemodynamically stable. She was transfused with 2 units PRBC.  Assessment/Plan: Acute blood loss anemia/melena -appreciate GI consult  -plans noted for EGD -suspect upper GI source--gastritis, esophagitis, AVMs -continue PPI -01/27/2015--transfused 2 units PRBC -Monitor hemoglobin -remain npo for now Asthma -Stable on room air -Continue Dulera Hypothyroidism -Continue Synthroid Hyperlipidemia -Hold statin for now  Family Communication:   Pt at beside Disposition Plan:   Home 1/21 if stable       Procedures/Studies:  No results found.      Subjective: Patient denies fevers, chills, headache, chest pain, dyspnea, nausea, vomiting, diarrhea, abdominal pain, dysuria,  hematuria   Objective: Filed Vitals:   01/27/15 2305 01/27/15 2339 01/28/15 0045 01/28/15 0815  BP: 112/56 115/57 112/55 117/58  Pulse: 71 73 71 71  Temp: 97.9 F (36.6 C) 97.9 F (36.6 C) 98.1 F (36.7 C) 98.1 F (36.7 C)  TempSrc: Oral Oral Oral Oral  Resp: '18 18 18 18  '$ Height:      Weight:      SpO2: 97% 97% 99% 96%    Intake/Output Summary (Last 24 hours) at 01/28/15 1035 Last data filed at 01/28/15 0600  Gross per 24 hour  Intake   1145 ml  Output      0 ml  Net   1145 ml   Weight change:  Exam:   General:  Pt is alert, follows commands appropriately, not in acute distress  HEENT: No icterus, No thrush, No neck mass, [place]/AT  Cardiovascular: RRR, S1/S2, no rubs, no gallops  Respiratory: CTA bilaterally, no wheezing, no crackles, no rhonchi  Abdomen: Soft/+BS, non tender, non distended, no guarding  Extremities: No edema, No lymphangitis, No petechiae, No rashes, no synovitis  Data Reviewed: Basic Metabolic Panel:  Recent Labs Lab 01/27/15 1447 01/27/15 1750 01/28/15 0632  NA 143 142 143  K 4.0 3.6 3.6  CL 108* 113* 114*  CO2 '24 26 26  '$ GLUCOSE 102* 111* 106*  BUN '18 20 12  '$ CREATININE 0.69 0.69 0.66  CALCIUM 8.2* 8.0* 8.3*   Liver Function Tests:  Recent Labs Lab 01/27/15 1447 01/27/15 1750 01/28/15 0632  AST '24 24 24  '$ ALT '16 18 16  '$ ALKPHOS 51 43 41  BILITOT <0.2 0.3 0.9  PROT 4.9* 4.7* 4.6*  ALBUMIN 3.4* 3.0* 3.0*   No results for input(s): LIPASE, AMYLASE  in the last 168 hours. No results for input(s): AMMONIA in the last 168 hours. CBC:  Recent Labs Lab 01/27/15 1447 01/27/15 1457 01/27/15 1700 01/28/15 0632  WBC 6.7  --  6.7 6.0  NEUTROABS 3.7  --  4.3  --   HGB  --  6.8* 7.3* 8.7*  HCT 20.5*  --  22.1* 25.5*  MCV 97  --  101.4* 96.2  PLT 187  --  198 165   Cardiac Enzymes: No results for input(s): CKTOTAL, CKMB, CKMBINDEX, TROPONINI in the last 168 hours. BNP: Invalid input(s): POCBNP CBG: No results for input(s):  GLUCAP in the last 168 hours.  No results found for this or any previous visit (from the past 240 hour(s)).   Scheduled Meds: . levothyroxine  75 mcg Oral QAC breakfast  . mometasone-formoterol  2 puff Inhalation BID  . pantoprazole (PROTONIX) IV  40 mg Intravenous Q12H  . sodium chloride  3 mL Intravenous Q12H   Continuous Infusions:    Cheryl Rybarczyk, DO  [facility] Pager [phone]  If 7PM-7AM, please contact night-coverage [email or web address] Password [email or web address] 01/28/2015, 10:35 AM   LOS: 1 day

## 2015-01-28 NOTE — Progress Notes (Signed)
Patient states she just took her advair inhaler and its the one her allergy md recommend and is not going to be using hospital dulera.

## 2015-01-28 NOTE — Progress Notes (Signed)
Per patient she self administered her Advair (from home) at approximately 0530. She would prefer to continue to use her Advair and to self administer it.

## 2015-01-28 NOTE — Op Note (Signed)
[facility] [address] [place], [address]   ENDOSCOPY PROCEDURE REPORT  PATIENT: Cheryl, Perry  MR#: [id number] BIRTHDATE: 1945-01-22 , 76  yrs. old GENDER: female ENDOSCOPIST: R.  Garfield Cornea, MD FACP [facility] REFERRED BY:  Redge Gainer, M.D. PROCEDURE DATE:  02-24-15 PROCEDURE:  EGD, diagnostic INDICATIONS:  Melena; decline in hemoglobin requiring transfusion. MEDICATIONS: Versed 4 mg IV and Demerol 50 mg IV in divided doses. Zofran 4 mg IV.  Xylocaine gel orally ASA CLASS:      Class III  CONSENT: The risks, benefits, limitations, alternatives and imponderables have been discussed.  The potential for biopsy, esophogeal dilation, etc. have also been reviewed.  Questions have been answered.  All parties agreeable.  Please see the history and physical in the medical record for more information.  DESCRIPTION OF PROCEDURE: After the risks benefits and alternatives of the procedure were thoroughly explained, informed consent was obtained.  The [id number] ([id number]) endoscope was introduced through the mouth and advanced to the second portion of the duodenum , limited by Without limitations. The instrument was slowly withdrawn as the mucosa was fully examined. Estimated blood loss is zero unless otherwise noted in this procedure report.    Normal-appearing tubular esophagus.  Some old blood and clot on the gastric mucosa.  2 cm hiatal hernia. Examination of the gastric mucosa after copiously washing the old blood away was undertaken.  The patient was found to have (3) 4-5 mm areas of ulceration in the prepyloric/pyloric channel mucosa. These lesions had clean bases and appeared benign.  No other suspect lesion seen.  The patient did have a couple of tiny gastric polyps which were not manipulated.  Pyloric Channel easily traversed.  Examination of bulb and second portion revealed no abnormalities  No therapeutic maneuvers needed.  No biopsies taken.   Retroflexed views revealed a hiatal hernia.     The scope was then withdrawn from the patient and the procedure completed.  COMPLICATIONS: There were no immediate complications.  ENDOSCOPIC IMPRESSION: 3 small prepyloric/gastric ulcers?"likely culprits causing bleeding. Hiatal hernia. Small gastric polyps not manipulated.  RECOMMENDATIONS: Clear liquid diet. Change PPI to Protonix 40 mg IV every 12 hours and then to the oral route. avoid All nonsteroidal agents.  check H. pylori serologies.  I discussed my findings and recommendations with the patient's husband, Shawan Tosh at [phone]  REPEAT EXAM:  eSigned:  R. Garfield Cornea, MD Rosalita Chessman [facility] 02-24-2015 12:03 PM    CC:  CPT CODES: ICD CODES:  The ICD and CPT codes recommended by this software are interpretations from the data that the clinical staff has captured with the software.  The verification of the translation of this report to the ICD and CPT codes and modifiers is the sole responsibility of the health care institution and practicing physician where this report was generated.  [employer] will not be held responsible for the validity of the ICD and CPT codes included on this report.  AMA assumes no liability for data contained or not contained herein. CPT is a Designer, television/film set of the [employer].  PATIENT NAME:  Cheryl, Perry MR#: [id number]

## 2015-01-29 DIAGNOSIS — K270 Acute peptic ulcer, site unspecified, with hemorrhage: Secondary | ICD-10-CM

## 2015-01-29 DIAGNOSIS — D62 Acute posthemorrhagic anemia: Secondary | ICD-10-CM

## 2015-01-29 DIAGNOSIS — J4520 Mild intermittent asthma, uncomplicated: Secondary | ICD-10-CM

## 2015-01-29 DIAGNOSIS — K922 Gastrointestinal hemorrhage, unspecified: Secondary | ICD-10-CM

## 2015-01-29 LAB — CBC
HCT: 27.2 % — ABNORMAL LOW (ref 36.0–46.0)
HEMOGLOBIN: 9 g/dL — AB (ref 12.0–15.0)
MCH: 32.7 pg (ref 26.0–34.0)
MCHC: 33.1 g/dL (ref 30.0–36.0)
MCV: 98.9 fL (ref 78.0–100.0)
PLATELETS: 185 10*3/uL (ref 150–400)
RBC: 2.75 MIL/uL — AB (ref 3.87–5.11)
RDW: 18.7 % — ABNORMAL HIGH (ref 11.5–15.5)
WBC: 7 10*3/uL (ref 4.0–10.5)

## 2015-01-29 LAB — H. PYLORI ANTIBODY, IGG

## 2015-01-29 MED ORDER — PANTOPRAZOLE SODIUM 40 MG PO TBEC
40.0000 mg | DELAYED_RELEASE_TABLET | Freq: Two times a day (BID) | ORAL | Status: DC
  Administered 2015-01-30: 40 mg via ORAL
  Filled 2015-01-29: qty 1

## 2015-01-29 MED ORDER — PRAVASTATIN SODIUM 40 MG PO TABS
40.0000 mg | ORAL_TABLET | Freq: Every day | ORAL | Status: DC
  Filled 2015-01-29: qty 1

## 2015-01-29 NOTE — Progress Notes (Signed)
Patient refuses Pravastatin. Dr. Marin Comment notified.

## 2015-01-29 NOTE — Progress Notes (Signed)
[facility] PROGRESS NOTE  Cheryl Perry KKX:[id number] DOB: April 21, 1945    PCP:   [name], FNP   HPI:  70 year old female with a history of asthma, hypothyroidism, and chronic back pain presented with 2-3 day history of dyspnea on exertion, melanotic stool with some nausea. The patient denied any vomiting, chest pain, fevers, chills, abdominal pain, hematochezia, melena. The patient takes approximately 3 ibuprofen per week for her low back pain. The patient also intermittently takes aspirin 81 mg daily. She denies any other over-the-counter medications. The patient had a colonoscopy (Dr. Neville Route) in December 2015 which was negative. She has routine surveillance colonoscopies due to family history of colon cancer and precancerous polyps in the past. The patient has never had an EGD. The patient went to see her primary care provider. Hemoglobin at her PCP office was 6.8. The patient states that she rarely takes over-the-counter omeprazole and Tums for her GERD. She states she takes approximately 2-3 times per week. As a result, the patient was sent to the hospital for further evaluation. In the ED, repeat hemoglobin was 7.2 with positive FOBT. The patient remained hemodynamically stable. She was transfused with 2 units PRBC.  She tolerated food well.      Rewiew of Systems:  Constitutional: Negative for malaise, fever and chills. No significant weight loss or weight gain Eyes: Negative for eye pain, redness and discharge, diplopia, visual changes, or flashes of light. ENMT: Negative for ear pain, hoarseness, nasal congestion, sinus pressure and sore throat. No headaches; tinnitus, drooling, or problem swallowing. Cardiovascular: Negative for chest pain, palpitations, diaphoresis, dyspnea and peripheral edema. ; No orthopnea, PND Respiratory: Negative for cough, hemoptysis, wheezing and stridor. No pleuritic chestpain. Gastrointestinal: Negative for nausea, vomiting, diarrhea,  constipation, abdominal pain, melena, blood in stool, hematemesis, jaundice and rectal bleeding.    Genitourinary: Negative for frequency, dysuria, incontinence,flank pain and hematuria; Musculoskeletal: Negative for back pain and neck pain. Negative for swelling and trauma.;  Skin: . Negative for pruritus, rash, abrasions, bruising and skin lesion.; ulcerations Neuro: Negative for headache, lightheadedness and neck stiffness. Negative for weakness, altered level of consciousness , altered mental status, extremity weakness, burning feet, involuntary movement, seizure and syncope.  Psych: negative for anxiety, depression, insomnia, tearfulness, panic attacks, hallucinations, paranoia, suicidal or homicidal ideation   Past Medical History  Diagnosis Date  . Thyroid disease   . Hyperlipidemia   . Allergy   . Asthma   . Vasculopathy     lymphatic  . Back pain     Past Surgical History  Procedure Laterality Date  . Tubal ligation    . Knee surgery    . Cystscopy    . Cystoscopy      Medications:  HOME MEDS: Prior to Admission medications   Medication Sig Start Date End Date Taking? Authorizing Provider  aspirin EC 81 MG tablet Take 81 mg by mouth daily.   Yes Historical Provider, MD  Biotin 300 MCG TABS Take 300 mcg by mouth daily.    Yes Historical Provider, MD  Cholecalciferol (VITAMIN D) 2000 UNITS CAPS Take 1 capsule by mouth daily.   Yes Historical Provider, MD  fluticasone-salmeterol (ADVAIR HFA) 45-21 MCG/ACT inhaler Inhale 2 puffs into the lungs 2 (two) times daily. 12/14/14  Yes Sharion Balloon, FNP  levothyroxine (SYNTHROID, LEVOTHROID) 75 MCG tablet TAKE 1 TABLET (75 MCG TOTAL)  BY MOUTH DAILY BEFORE BREAKFAST. 12/14/14  Yes Sharion Balloon, FNP  loratadine (CLARITIN) 10 MG tablet Take 10 mg  by mouth daily.   Yes Historical Provider, MD  montelukast (SINGULAIR) 10 MG tablet Take 10 mg by mouth at bedtime.   Yes Historical Provider, MD  Niacin-Lovastatin (ADVICOR) 1000-40 MG  TB24 Take 1 tablet by mouth at bedtime. 06/15/14  Yes Sharion Balloon, FNP  Probiotic Product (PROBIOTIC ADVANCED PO) Take 1 capsule by mouth daily.    Yes Historical Provider, MD  albuterol (PROVENTIL HFA) 108 (90 BASE) MCG/ACT inhaler Inhale 2 puffs into the lungs every 6 (six) hours as needed for wheezing. May use only 3 times a month    Historical Provider, MD  hyoscyamine (LEVSIN, ANASPAZ) 0.125 MG tablet Take 0.125 mg by mouth every 4 (four) hours as needed for cramping.     Historical Provider, MD  methocarbamol (ROBAXIN) 500 MG tablet Take 1 tablet (500 mg total) by mouth 3 (three) times daily. Patient taking differently: Take 500 mg by mouth every 8 (eight) hours as needed for muscle spasms.  12/14/14   Sharion Balloon, FNP     Allergies:  Allergies  Allergen Reactions  . Naproxen Hives and Itching  . Ampicillin     rash  . Avelox [Moxifloxacin Hcl In Nacl]     Rash   . Bactrim [Sulfamethoxazole-Trimethoprim]     Rash   . Levaquin [Levofloxacin In D5w]     Rash   . Lipitor [Atorvastatin]     mylagias  . Macrodantin [Nitrofurantoin Macrocrystal]     Rash   . Premarin [Conjugated Estrogens]     Itching tightness in chest  . Trental [Pentoxifylline Er]     Rash     Social History:   reports that she quit smoking about 40 years ago. She does not have any smokeless tobacco history on file. She reports that she does not drink alcohol or use illicit drugs.  Family History: Family History  Problem Relation Age of Onset  . Stroke Mother      Physical Exam: Filed Vitals:   01/28/15 1405 01/28/15 1949 01/28/15 2042 01/29/15 0447  BP: 97/54  116/46 99/58  Pulse: 69 62 73 70  Temp: 98.2 F (36.8 C)  98.3 F (36.8 C) 98.2 F (36.8 C)  TempSrc: Oral  Oral Oral  Resp: '16 16 16 16  '$ Height:      Weight:      SpO2: 96% 96% 95% 97%   Blood pressure 99/58, pulse 70, temperature 98.2 F (36.8 C), temperature source Oral, resp. rate 16, height '5\' 6"'$  (1.676 m), weight  80.105 kg (176 lb 9.6 oz), SpO2 97 %.  GEN:  Pleasant  patient lying in the stretcher in no acute distress; cooperative with exam. PSYCH:  alert and oriented x4; does not appear anxious or depressed; affect is appropriate. HEENT: Mucous membranes pink and anicteric; PERRLA; EOM intact; no cervical lymphadenopathy nor thyromegaly or carotid bruit; no JVD; There were no stridor. Neck is very supple. Breasts:: Not examined CHEST WALL: No tenderness CHEST: Normal respiration, clear to auscultation bilaterally.  HEART: Regular rate and rhythm.  There are no murmur, rub, or gallops.   BACK: No kyphosis or scoliosis; no CVA tenderness ABDOMEN: soft and non-tender; no masses, no organomegaly, normal abdominal bowel sounds; no pannus; no intertriginous candida. There is no rebound and no distention. Rectal Exam: Not done EXTREMITIES: No bone or joint deformity; age-appropriate arthropathy of the hands and knees; no edema; no ulcerations.  There is no calf tenderness. Genitalia: not examined PULSES: 2+ and symmetric SKIN: Normal hydration no rash  or ulceration CNS: Cranial nerves 2-12 grossly intact no focal lateralizing neurologic deficit.  Speech is fluent; uvula elevated with phonation, facial symmetry and tongue midline. DTR are normal bilaterally, cerebella exam is intact, barbinski is negative and strengths are equaled bilaterally.  No sensory loss.   Labs on Admission:  Basic Metabolic Panel:  Recent Labs Lab 01/27/15 1447 01/27/15 1750 01/28/15 0632  NA 143 142 143  K 4.0 3.6 3.6  CL 108* 113* 114*  CO2 '24 26 26  '$ GLUCOSE 102* 111* 106*  BUN '18 20 12  '$ CREATININE 0.69 0.69 0.66  CALCIUM 8.2* 8.0* 8.3*   Liver Function Tests:  Recent Labs Lab 01/27/15 1447 01/27/15 1750 01/28/15 0632  AST '24 24 24  '$ ALT '16 18 16  '$ ALKPHOS 51 43 41  BILITOT <0.2 0.3 0.9  PROT 4.9* 4.7* 4.6*  ALBUMIN 3.4* 3.0* 3.0*   CBC:  Recent Labs Lab 01/27/15 1447 01/27/15 1457 01/27/15 1700  01/28/15 0632 01/29/15 0630  WBC 6.7  --  6.7 6.0 7.0  NEUTROABS 3.7  --  4.3  --   --   HGB  --  6.8* 7.3* 8.7* 9.0*  HCT 20.5*  --  22.1* 25.5* 27.2*  MCV 97  --  101.4* 96.2 98.9  PLT 187  --  198 165 185   Assessment/Plan Present on Admission:  . UGI bleed . Acute blood loss anemia . Hypothyroidism . Asthma . GERD (gastroesophageal reflux disease)  PLAN:   Acute blood loss anemia/melena -appreciate GI consult  EGD showed non bleeding ulcer.  H Pylori negative. -continue PPI  IV -01/27/2015--transfused 2 units PRBC -Monitor hemoglobin Home tomorrow if stable H and H per Dr Dereck Leep.  Asthma -Stable on room air -Continue Dulera  Hypothyroidism -Continue Synthroid  Hyperlipidemia  Resume statin.    Other plans as per orders. Code Status: FULL Haskel Khan, MD.  FACP [facility] Pager [phone] 7pm to 7am.  01/29/2015, 1:10 PM

## 2015-01-29 NOTE — Progress Notes (Signed)
  Subjective:  Patient has no complaints. She denies nausea vomiting heartburn or abdominal pain. She had bowel movement this morning stool was not as black.  Objective:  Blood pressure 99/58, pulse 70, temperature 98.2 F (36.8 C), temperature source Oral, resp. rate 16, height '5\' 6"'$  (1.676 m), weight 176 lb 9.6 oz (80.105 kg), SpO2 97 %. Patient is alert and in no acute distress. Abdomen is soft and nontender without organomegaly or masses. No LE edema or clubbing noted.  Labs/studies Results:   Recent Labs  01/27/15 1700 01/28/15 0632 01/29/15 0630  WBC 6.7 6.0 7.0  HGB 7.3* 8.7* 9.0*  HCT 22.1* 25.5* 27.2*  PLT 198 165 185    BMET   Recent Labs  01/27/15 1447 01/27/15 1750 01/28/15 0632  NA 143 142 143  K 4.0 3.6 3.6  CL 108* 113* 114*  CO2 '24 26 26  '$ GLUCOSE 102* 111* 106*  BUN '18 20 12  '$ CREATININE 0.69 0.69 0.66  CALCIUM 8.2* 8.0* 8.3*    LFT   Recent Labs  01/27/15 1447 01/27/15 1750 01/28/15 0632  PROT 4.9* 4.7* 4.6*  ALBUMIN 3.4* 3.0* 3.0*  AST '24 24 24  '$ ALT '16 18 16  '$ ALKPHOS 51 43 41  BILITOT <0.2 0.3 0.9     Assessment:  #1. Upper GI bleed secondary to gastric ulcers. Patient has received 2 units of PRBCs. She has no GI symptoms and abdominal exam is benign. H. pylori serology is negative therefore peptic ulcer disease felt to be secondary to NSAID use. She is on low-dose aspirin and has also been taking Advil on when necessary basis. #2. Anemia secondary to upper GI bleed. Patient has received 2 units of PRBCs. Hemoglobin is low but gradually creeping up.  Recommendations:  Advance diet. Switch to oral pantoprazole starting tomorrow morning. H&H in a.m.

## 2015-01-30 LAB — HEMOGLOBIN AND HEMATOCRIT, BLOOD
HCT: 26.8 % — ABNORMAL LOW (ref 36.0–46.0)
Hemoglobin: 8.8 g/dL — ABNORMAL LOW (ref 12.0–15.0)

## 2015-01-30 MED ORDER — PANTOPRAZOLE SODIUM 40 MG PO TBEC: 40.0000 mg | DELAYED_RELEASE_TABLET | Freq: Two times a day (BID) | ORAL | Status: DC

## 2015-01-30 NOTE — Discharge Summary (Signed)
Physician Discharge Summary  Cheryl Perry [id number] DOB: 30-Aug-1945 DOA: 01/27/2015  PCP: [name], FNP  Admit date: 01/27/2015 Discharge date: 01/30/2015  Time spent: 35 minutes  Recommendations for Outpatient Follow-up:  1. Follow up with Dr Felipe Drone in 3 weeks. 2. Follow up with your PCP as scheduled.     Discharge Diagnoses:  Principal Problem:   UGI bleed Active Problems:   Hypothyroidism   Asthma   GERD (gastroesophageal reflux disease)   Acute blood loss anemia   Acute GI bleeding   Discharge Condition:  Marked improvement.  Stable H and H and able to tolerate food.   Diet recommendation: Cardiac.   Filed Weights   01/27/15 1614 01/27/15 2023  Weight: 79.833 kg (176 lb) 80.105 kg (176 lb 9.6 oz)    History of present illness: Patient was admitted with upper GI bleed by Dr Curly Rim on Jan 27, 2015.  As per his H and P:  " Cheryl Perry is a 70 y.o. female with a past medical history of asthma, hypothyroidism, chronic low back pain who was in her usual state of health till about 3 days ago when she started developing dark tarry stools. Has some nausea but no vomiting. No abdominal pain, but felt full in her abdomen. Denies any bloody emesis. Felt a little dizzy when getting up from a lying position but it did not last very long. Some shortness of breath with exertion over the last 2 days, but denies any chest pain. She has never had upper endoscopy. She does not take pain medications on a regular basis. She takes muscle relaxants occasionally for her back pain. She does admit to a history of GERD, but not on a regular basis and usually relieved with Tums and takes Prilosec occasionally. She does get the periodic colonoscopies due to high risk of colon cancer based on previous colonoscopies and adenomatous polyps. Last colonoscopy was in December 2015.  Hospital Course: Patient was admitted into the hospital and she was transfused 2 units of PRBCs.  She was seen  in consultation with GI, and underwent EGD which showed non bleeding ulcer.  Her H Pylori was negative as well.  She was given IV PPI, and her Hb was monitored, and they remained stable.  Dr Dereck Leep OK for her to be discharged today, and she will follow up with Dr Felipe Drone in about 3 weeks.  She will avoid NSAIDS, and ASA, and will take PPI BID orally.  For her asthma, she has been stable.  Her hypothryodism was Tx with suppplement.  She did not want to take her Statin while in the hospital, but will resume upon discharge.  She has been on Advicor.  She is anxious to go home, and will be discharged to home today.  She will follow up with her PCP as scheduled.    Discharge Exam: Filed Vitals:   01/29/15 2112 01/30/15 0504  BP: 130/69 112/61  Pulse: 70 60  Temp: 98.8 F (37.1 C) 97.9 F (36.6 C)  Resp: 20 18    Discharge Instructions   Discharge Instructions    Diet - low sodium heart healthy    Complete by:  As directed      Discharge instructions    Complete by:  As directed   Avoid NSAIDS and ASA as discussed.  Follow up with Dr Felipe Drone as recommended.          Current Discharge Medication List    START taking these medications  Details  pantoprazole (PROTONIX) 40 MG tablet Take 1 tablet (40 mg total) by mouth 2 (two) times daily before a meal. Qty: 60 tablet, Refills: 1      CONTINUE these medications which have NOT CHANGED   Details  Biotin 300 MCG TABS Take 300 mcg by mouth daily.     Cholecalciferol (VITAMIN D) 2000 UNITS CAPS Take 1 capsule by mouth daily.    fluticasone-salmeterol (ADVAIR HFA) 45-21 MCG/ACT inhaler Inhale 2 puffs into the lungs 2 (two) times daily. Qty: 3 Inhaler, Refills: 3    levothyroxine (SYNTHROID, LEVOTHROID) 75 MCG tablet TAKE 1 TABLET (75 MCG TOTAL)  BY MOUTH DAILY BEFORE BREAKFAST. Qty: 90 tablet, Refills: 3   Associated Diagnoses: Other specified hypothyroidism    loratadine (CLARITIN) 10 MG tablet Take 10 mg by mouth daily.    montelukast  (SINGULAIR) 10 MG tablet Take 10 mg by mouth at bedtime.    Niacin-Lovastatin (ADVICOR) 1000-40 MG TB24 Take 1 tablet by mouth at bedtime. Qty: 90 each, Refills: 3   Associated Diagnoses: HLD (hyperlipidemia)    Probiotic Product (PROBIOTIC ADVANCED PO) Take 1 capsule by mouth daily.     albuterol (PROVENTIL HFA) 108 (90 BASE) MCG/ACT inhaler Inhale 2 puffs into the lungs every 6 (six) hours as needed for wheezing. May use only 3 times a month    hyoscyamine (LEVSIN, ANASPAZ) 0.125 MG tablet Take 0.125 mg by mouth every 4 (four) hours as needed for cramping.     methocarbamol (ROBAXIN) 500 MG tablet Take 1 tablet (500 mg total) by mouth 3 (three) times daily. Qty: 90 tablet, Refills: 1      STOP taking these medications     aspirin EC 81 MG tablet        Allergies  Allergen Reactions  . Naproxen Hives and Itching  . Ampicillin     rash  . Avelox [Moxifloxacin Hcl In Nacl]     Rash   . Bactrim [Sulfamethoxazole-Trimethoprim]     Rash   . Levaquin [Levofloxacin In D5w]     Rash   . Lipitor [Atorvastatin]     mylagias  . Macrodantin [Nitrofurantoin Macrocrystal]     Rash   . Premarin [Conjugated Estrogens]     Itching tightness in chest  . Trental [Pentoxifylline Er]     Rash       The results of significant diagnostics from this hospitalization (including imaging, microbiology, ancillary and laboratory) are listed below for reference.      Recent Labs Lab 01/27/15 1447 01/27/15 1750 01/28/15 0632  NA 143 142 143  K 4.0 3.6 3.6  CL 108* 113* 114*  CO2 '24 26 26  '$ GLUCOSE 102* 111* 106*  BUN '18 20 12  '$ CREATININE 0.69 0.69 0.66  CALCIUM 8.2* 8.0* 8.3*   Liver Function Tests:  Recent Labs Lab 01/27/15 1447 01/27/15 1750 01/28/15 0632  AST '24 24 24  '$ ALT '16 18 16  '$ ALKPHOS 51 43 41  BILITOT <0.2 0.3 0.9  PROT 4.9* 4.7* 4.6*  ALBUMIN 3.4* 3.0* 3.0*   CBC:  Recent Labs Lab 01/27/15 1447 01/27/15 1457 01/27/15 1700 01/28/15 0632  01/29/15 0630 01/30/15 0618  WBC 6.7  --  6.7 6.0 7.0  --   NEUTROABS 3.7  --  4.3  --   --   --   HGB  --  6.8* 7.3* 8.7* 9.0* 8.8*  HCT 20.5*  --  22.1* 25.5* 27.2* 26.8*  MCV 97  --  101.4* 96.2 98.9  --  PLT 187  --  198 165 185  --     Signed:  [name] MD.  [facility] 01/30/2015, 10:34 AM

## 2015-01-30 NOTE — Progress Notes (Signed)
Pt IV and telemetry removed, tolerated well.  Reviewed discharge instructions with pt and answered all questions at this time.  Will continue to monitor pt until leaves the floor.

## 2015-01-30 NOTE — Progress Notes (Signed)
Patient has no complaints. She denies nausea vomiting or abdominal pain. He had a bowel movement this morning. Stool is not black and not as dark as yesterday. Abdominal exam within normal limits. Hemoglobin 8.8 g.  Assessment:  2 unit upper GI bleed secondary to peptic ulcer disease secondary to NSAID use. Pylori serology is negative. Patient stable for discharge from GI standpoint. Continue pantoprazole at 40 mg by mouth twice a day. Patient will continue to hold aspirin and refrain from using other NSAIDs but can take Tylenol. Will arrange for office visit with Dr. Gala Romney in 3-4 weeks.

## 2015-01-31 NOTE — Progress Notes (Signed)
Patient aware.

## 2015-02-01 ENCOUNTER — Encounter (HOSPITAL_COMMUNITY): Payer: Self-pay | Admitting: Internal Medicine

## 2015-02-02 DIAGNOSIS — J3081 Allergic rhinitis due to animal (cat) (dog) hair and dander: Secondary | ICD-10-CM | POA: Diagnosis not present

## 2015-02-02 DIAGNOSIS — J3089 Other allergic rhinitis: Secondary | ICD-10-CM | POA: Diagnosis not present

## 2015-02-02 DIAGNOSIS — J301 Allergic rhinitis due to pollen: Secondary | ICD-10-CM | POA: Diagnosis not present

## 2015-02-08 ENCOUNTER — Ambulatory Visit (INDEPENDENT_AMBULATORY_CARE_PROVIDER_SITE_OTHER): Payer: Medicare Other | Admitting: Internal Medicine

## 2015-02-08 ENCOUNTER — Encounter: Payer: Self-pay | Admitting: Internal Medicine

## 2015-02-08 VITALS — BP 126/84 | HR 68 | Temp 97.4°F | Ht 66.0 in | Wt 172.0 lb

## 2015-02-08 DIAGNOSIS — D62 Acute posthemorrhagic anemia: Secondary | ICD-10-CM | POA: Diagnosis not present

## 2015-02-08 MED ORDER — PANTOPRAZOLE SODIUM 40 MG PO TBEC: 40.0000 mg | DELAYED_RELEASE_TABLET | Freq: Every day | ORAL | Status: DC

## 2015-02-08 NOTE — Progress Notes (Signed)
Primary Care Physician:  [name], FNP- Primary Gastroenterologist:  Dr. Earlean Shawl  Pre-Procedure History & Physical: HPI:  Cheryl Perry is a 70 y.o. female here for hospital followup relating to recent upper GI bleed secondary to multiple NSAID-induced gastric ulcers. Explore serologies negative. She presented with a hemodynamically significant GI bleed bleed requiring a total of 2 units of packed blood cells. EGD demonstrated 3 ulcers  in the stomach. Ulcer craters did not require any endoscopic therapy. Refractory peptic ulcer disease include regularly use of ibuprofen and aspirin. He is doing well since discharge. She's taking over-the-counter omeprazole until her mail order supply of Protonix arrives.  Her last H&H from January 22 was 8.8 and 26.8  Past Medical History  Diagnosis Date  . Thyroid disease   . Hyperlipidemia   . Allergy   . Asthma   . Vasculopathy     lymphatic  . Back pain   . Hiatal hernia     Past Surgical History  Procedure Laterality Date  . Tubal ligation    . Knee surgery    . Cystscopy    . Cystoscopy    . Esophagogastroduodenoscopy N/A 01/28/2015    Dr.Doneta Bayman- 3 small prepyloric/gastric ulcers- likely culprits causing bleeding. hiatal hernia, small gastic polyps not manipulated.    Prior to Admission medications   Medication Sig Start Date End Date Taking? Authorizing Provider  albuterol (PROVENTIL HFA) 108 (90 BASE) MCG/ACT inhaler Inhale 2 puffs into the lungs every 6 (six) hours as needed for wheezing. May use only 3 times a month   Yes Historical Provider, MD  Biotin 300 MCG TABS Take 300 mcg by mouth daily.    Yes Historical Provider, MD  Cholecalciferol (VITAMIN D) 2000 UNITS CAPS Take 1 capsule by mouth daily.   Yes Historical Provider, MD  fluticasone-salmeterol (ADVAIR HFA) 45-21 MCG/ACT inhaler Inhale 2 puffs into the lungs 2 (two) times daily. 12/14/14  Yes Sharion Balloon, FNP  hyoscyamine (LEVSIN, ANASPAZ) 0.125 MG tablet Take  0.125 mg by mouth every 4 (four) hours as needed for cramping.    Yes Historical Provider, MD  levothyroxine (SYNTHROID, LEVOTHROID) 75 MCG tablet TAKE 1 TABLET (75 MCG TOTAL)  BY MOUTH DAILY BEFORE BREAKFAST. 12/14/14  Yes Sharion Balloon, FNP  loratadine (CLARITIN) 10 MG tablet Take 10 mg by mouth daily.   Yes Historical Provider, MD  methocarbamol (ROBAXIN) 500 MG tablet Take 1 tablet (500 mg total) by mouth 3 (three) times daily. Patient taking differently: Take 500 mg by mouth every 8 (eight) hours as needed for muscle spasms.  12/14/14  Yes Sharion Balloon, FNP  montelukast (SINGULAIR) 10 MG tablet Take 10 mg by mouth at bedtime.   Yes Historical Provider, MD  Niacin-Lovastatin (ADVICOR) 1000-40 MG TB24 Take 1 tablet by mouth at bedtime. 06/15/14  Yes Sharion Balloon, FNP  pantoprazole (PROTONIX) 40 MG tablet Take 1 tablet (40 mg total) by mouth 2 (two) times daily before a meal. 01/30/15  Yes Orvan Falconer, MD  Probiotic Product (PROBIOTIC ADVANCED PO) Take 1 capsule by mouth daily.    Yes Historical Provider, MD  pantoprazole (PROTONIX) 40 MG tablet Take 1 tablet (40 mg total) by mouth daily. 02/08/15   Daneil Dolin, MD    Allergies as of 02/08/2015 - Review Complete 02/08/2015  Allergen Reaction Noted  . Naproxen Hives and Itching 09/02/2013  . Ampicillin  06/05/2012  . Avelox [moxifloxacin hcl in nacl]  06/05/2012  . Bactrim [sulfamethoxazole-trimethoprim]  06/05/2012  .  Levaquin [levofloxacin in d5w]  06/05/2012  . Lipitor [atorvastatin]  06/05/2012  . Macrodantin [nitrofurantoin macrocrystal]  06/05/2012  . Premarin [conjugated estrogens]  06/05/2012  . Trental [pentoxifylline er]  06/05/2012    Family History  Problem Relation Age of Onset  . Stroke Mother     Social History   Social History  . Marital Status: Married    Spouse Name: N/A  . Number of Children: N/A  . Years of Education: N/A   Occupational History  . Not on file.   Social History Main Topics  . Smoking  status: Former Smoker    Quit date: 06/06/1974  . Smokeless tobacco: Not on file  . Alcohol Use: No  . Drug Use: No  . Sexual Activity: Not on file   Other Topics Concern  . Not on file   Social History Narrative    Review of Systems: See HPI, otherwise negative ROS  Physical Exam: BP 126/84 mmHg  Pulse 68  Temp(Src) 97.4 F (36.3 C)  Ht '5\' 6"'$  (1.676 m)  Wt 172 lb (78.019 kg)  BMI 27.77 kg/m2 General:   Alert,  Well-developed, well-nourished, pleasant and cooperative in NAD Skin:  Intact without significant lesions or rashes. Eyes:  Sclera clear, no icterus.   Conjunctiva pale Lungs:  Clear throughout to auscultation.   No wheezes, crackles, or rhonchi. No acute distress. Heart:  Regular rate and rhythm; no murmurs, clicks, rubs,  or gallops. Abdomen: Non-distended, normal bowel sounds.  Soft and nontender without appreciable mass or hepatosplenomegaly.  Pulses:  Normal pulses noted. Extremities:  Without clubbing or edema.  Impression:  Pleasant 70 year old lady with recent hospitalization secondary upper GI bleed secondary to multiple gastric ulcers-likely NSAID-related. Clinically, doing well. Not yet back on her prescribed twice a day PPI regimen. She is expecting Protonix in the mail any time now. We need to get her twice a day therapy at the local pharmacy until her mail order arrives.   Recommendations:  Repeat H&H in 2 weeks. Plan for a follow-up visit with [place] in about 3 months to set up repeat EGD.  Avoid all nonsteroidal agents as much as possible. We can have a discussion about resuming a baby aspirin daily at some point in the future.     Notice: This dictation was prepared with Dragon dictation along with smaller phrase technology. Any transcriptional errors that result from this process are unintentional and may not be corrected upon review.

## 2015-02-08 NOTE — Patient Instructions (Signed)
Continue to avoid all NSAID medications   Take Protonix 40 mg twice daily for the next 3 months  Office visit in 3 months to set up repeat EGD  CBC in 2 weeks

## 2015-02-09 DIAGNOSIS — J3089 Other allergic rhinitis: Secondary | ICD-10-CM | POA: Diagnosis not present

## 2015-02-09 DIAGNOSIS — J3081 Allergic rhinitis due to animal (cat) (dog) hair and dander: Secondary | ICD-10-CM | POA: Diagnosis not present

## 2015-02-09 DIAGNOSIS — J301 Allergic rhinitis due to pollen: Secondary | ICD-10-CM | POA: Diagnosis not present

## 2015-02-16 DIAGNOSIS — J3089 Other allergic rhinitis: Secondary | ICD-10-CM | POA: Diagnosis not present

## 2015-02-16 DIAGNOSIS — J3081 Allergic rhinitis due to animal (cat) (dog) hair and dander: Secondary | ICD-10-CM | POA: Diagnosis not present

## 2015-02-16 DIAGNOSIS — J301 Allergic rhinitis due to pollen: Secondary | ICD-10-CM | POA: Diagnosis not present

## 2015-02-21 ENCOUNTER — Other Ambulatory Visit: Payer: Medicare Other

## 2015-02-21 LAB — CBC WITH DIFFERENTIAL/PLATELET
BASOS ABS: 0 10*3/uL (ref 0.0–0.1)
Basophils Relative: 1 % (ref 0–1)
Eosinophils Absolute: 0.2 10*3/uL (ref 0.0–0.7)
Eosinophils Relative: 4 % (ref 0–5)
HEMATOCRIT: 35.6 % — AB (ref 36.0–46.0)
Hemoglobin: 11 g/dL — ABNORMAL LOW (ref 12.0–15.0)
LYMPHS ABS: 1.3 10*3/uL (ref 0.7–4.0)
LYMPHS PCT: 32 % (ref 12–46)
MCH: 29.7 pg (ref 26.0–34.0)
MCHC: 30.9 g/dL (ref 30.0–36.0)
MCV: 96.2 fL (ref 78.0–100.0)
MONOS PCT: 11 % (ref 3–12)
MPV: 9.9 fL (ref 8.6–12.4)
Monocytes Absolute: 0.5 10*3/uL (ref 0.1–1.0)
NEUTROS PCT: 52 % (ref 43–77)
Neutro Abs: 2.1 10*3/uL (ref 1.7–7.7)
Platelets: 311 10*3/uL (ref 150–400)
RBC: 3.7 MIL/uL — ABNORMAL LOW (ref 3.87–5.11)
RDW: 14.6 % (ref 11.5–15.5)
WBC: 4.1 10*3/uL (ref 4.0–10.5)

## 2015-02-23 DIAGNOSIS — J301 Allergic rhinitis due to pollen: Secondary | ICD-10-CM | POA: Diagnosis not present

## 2015-02-23 DIAGNOSIS — J3089 Other allergic rhinitis: Secondary | ICD-10-CM | POA: Diagnosis not present

## 2015-02-23 DIAGNOSIS — J3081 Allergic rhinitis due to animal (cat) (dog) hair and dander: Secondary | ICD-10-CM | POA: Diagnosis not present

## 2015-02-24 ENCOUNTER — Telehealth: Payer: Self-pay | Admitting: Internal Medicine

## 2015-02-24 NOTE — Telephone Encounter (Signed)
Pt had labs done on Monday and was calling to check on her results. Please call [phone]

## 2015-02-24 NOTE — Telephone Encounter (Signed)
H&H much improved. Please continue with plan as day of office visit.

## 2015-02-24 NOTE — Telephone Encounter (Signed)
Pt is aware.  

## 2015-02-24 NOTE — Telephone Encounter (Signed)
Routing to RMR for results.

## 2015-02-25 ENCOUNTER — Ambulatory Visit: Payer: Medicare Other | Admitting: Internal Medicine

## 2015-03-02 DIAGNOSIS — J3081 Allergic rhinitis due to animal (cat) (dog) hair and dander: Secondary | ICD-10-CM | POA: Diagnosis not present

## 2015-03-02 DIAGNOSIS — J3089 Other allergic rhinitis: Secondary | ICD-10-CM | POA: Diagnosis not present

## 2015-03-02 DIAGNOSIS — J301 Allergic rhinitis due to pollen: Secondary | ICD-10-CM | POA: Diagnosis not present

## 2015-03-09 DIAGNOSIS — J3089 Other allergic rhinitis: Secondary | ICD-10-CM | POA: Diagnosis not present

## 2015-03-09 DIAGNOSIS — J301 Allergic rhinitis due to pollen: Secondary | ICD-10-CM | POA: Diagnosis not present

## 2015-03-09 DIAGNOSIS — J3081 Allergic rhinitis due to animal (cat) (dog) hair and dander: Secondary | ICD-10-CM | POA: Diagnosis not present

## 2015-03-15 DIAGNOSIS — J3081 Allergic rhinitis due to animal (cat) (dog) hair and dander: Secondary | ICD-10-CM | POA: Diagnosis not present

## 2015-03-15 DIAGNOSIS — J3089 Other allergic rhinitis: Secondary | ICD-10-CM | POA: Diagnosis not present

## 2015-03-15 DIAGNOSIS — J301 Allergic rhinitis due to pollen: Secondary | ICD-10-CM | POA: Diagnosis not present

## 2015-03-17 DIAGNOSIS — M5442 Lumbago with sciatica, left side: Secondary | ICD-10-CM | POA: Diagnosis not present

## 2015-03-17 DIAGNOSIS — G8929 Other chronic pain: Secondary | ICD-10-CM | POA: Diagnosis not present

## 2015-03-17 DIAGNOSIS — M542 Cervicalgia: Secondary | ICD-10-CM | POA: Diagnosis not present

## 2015-03-17 DIAGNOSIS — M5136 Other intervertebral disc degeneration, lumbar region: Secondary | ICD-10-CM | POA: Diagnosis not present

## 2015-03-18 ENCOUNTER — Encounter: Payer: Self-pay | Admitting: *Deleted

## 2015-03-22 ENCOUNTER — Encounter: Payer: Self-pay | Admitting: Physical Therapy

## 2015-03-22 ENCOUNTER — Ambulatory Visit: Payer: Medicare Other | Attending: Physical Medicine and Rehabilitation | Admitting: Physical Therapy

## 2015-03-22 DIAGNOSIS — M542 Cervicalgia: Secondary | ICD-10-CM | POA: Insufficient documentation

## 2015-03-22 DIAGNOSIS — M436 Torticollis: Secondary | ICD-10-CM | POA: Insufficient documentation

## 2015-03-22 NOTE — Patient Instructions (Signed)
  Flexibility: Upper Trapezius Stretch   Gently grasp right side of head while reaching behind back with other hand. Tilt head away until a gentle stretch is felt. Hold 30 seconds. Repeat 3 times per set. Do 2 sessions per day.  http://orth.exer.us/340   Levator Stretch   Grasp seat or sit on hand on side to be stretched. Turn head toward other side and look down. Use hand on head to gently stretch neck in that position. Hold _30___ seconds. Repeat on other side. Repeat 3 times. Do 2 sessions per day.  http://gt2.exer.us/30   Scapular Retraction (Standing)   With arms at sides, pinch shoulder blades together. Repeat 10 times per set. Do 1-3 sets per session. Do 2 sessions per day.  http://orth.exer.us/944     Flexibility: Neck Retraction   Pull head straight back, keeping eyes and jaw level. Hold 3-5 seconds. Repeat _10 times per set. Do 3-5  sessions per day.  http://orth.exer.us/344   Posture - Sitting   Sit upright, head facing forward. Try using a roll to support lower back. Keep shoulders relaxed, and avoid rounded back. Keep hips level with knees. Avoid crossing legs for long periods.   Flexibility: Corner Stretch   Standing in corner or a doorway with hands just above shoulder level.  Lean forward until a comfortable stretch is felt across chest. Hold __30__ seconds. Repeat __3__ times per set.  Do _2___ sessions per day.  http://orth.exer.us/342   Copyright  VHI. All rights reserved.   [name], PT 03/22/2015 2:29 PM [facility] [address] [place], [place], [address] Phone: [phone]   Fax:  [phone]

## 2015-03-22 NOTE — Therapy (Signed)
[facility] [place], [place], [address] Phone: [phone]   Fax:  [phone]  Physical Therapy Evaluation  Patient Details  Name: Cheryl Perry MRN: [id number] Date of Birth: 11/03/1945 Referring Provider: Jeri Cos PA-C/Richard Ramos MD  Encounter Date: 03/22/2015      PT End of Session - 03/22/15 1349    Visit Number 1   Number of Visits 12   Date for PT Re-Evaluation 05/03/15   PT Start Time 3419   PT Stop Time 1445   PT Time Calculation (min) 56 min   Activity Tolerance Patient tolerated treatment well   Behavior During Therapy [facility] for tasks assessed/performed      Past Medical History  Diagnosis Date  . Thyroid disease   . Hyperlipidemia   . Allergy   . Asthma   . Vasculopathy     lymphatic  . Back pain   . Hiatal hernia     Past Surgical History  Procedure Laterality Date  . Tubal ligation    . Knee surgery    . Cystscopy    . Cystoscopy    . Esophagogastroduodenoscopy N/A 01/28/2015    Dr.Rourk- 3 small prepyloric/gastric ulcers- likely culprits causing bleeding. hiatal hernia, small gastic polyps not manipulated.    There were no vitals filed for this visit.  Visit Diagnosis:  Neck pain - Plan: PT plan of care cert/re-cert  Stiffness of neck - Plan: PT plan of care cert/re-cert      Subjective Assessment - 03/22/15 1345    Subjective Patient began experiencing neck pain in Nov 2016 after doing some chores. Neck was very stiff. Right side is worse. Pain runs from subocciptal along upper traps. She states that C6/7 is bad as well as C5/6. Patient has intermittent N/T in the radial LUE and more pain to hand in RUE. Occurs more when she is sitting.   Pertinent History asthma, Osteopenia, herniated disc L5/S1, CTS bil (L > R)   Patient Stated Goals decrease pain, improve movement   Currently in Pain? Yes   Pain Score 4    Pain Location Neck   Pain Orientation Right;Left   Pain Descriptors  / Indicators Stabbing   Pain Type Acute pain   Pain Onset More than a month ago   Pain Frequency Intermittent   Aggravating Factors  moving her head too much or too quickly   Pain Relieving Factors ice, tylenol and rest   Effect of Pain on Daily Activities hurts to move            [facility] PT Assessment - 03/22/15 0001    Assessment   Medical Diagnosis C5/6 disc degeneration; neck pain   Referring Provider Jeri Cos PA-C/Richard Ramos MD   Onset Date/Surgical Date 11/09/14   Next MD Visit 04/18/15   Precautions   Precaution Comments adhesive allergy   Balance Screen   Has the patient fallen in the past 6 months No   Has the patient had a decrease in activity level because of a fear of falling?  No   Is the patient reluctant to leave their home because of a fear of falling?  No   Home Environment   Living Environment Private residence   Home Access Level entry   Prior Function   Level of Independence Independent   Vocation Retired   Observation/Other Assessments   Focus on Therapeutic Outcomes (FOTO)  56% limited   Posture/Postural Control   Posture/Postural Control Postural limitations  Postural Limitations Rounded Shoulders;Forward head;Increased thoracic kyphosis   Posture Comments R shoulder depressed 0.5 inch   ROM / Strength   AROM / PROM / Strength AROM;Strength   AROM   Overall AROM Comments pain is in R upper cervical with all motions   AROM Assessment Site Cervical   Cervical Flexion full   Cervical Extension 38  sits in 10 deg ext to start   Cervical - Right Side Bend 24   Cervical - Left Side Bend 18   Cervical - Right Rotation 47   Cervical - Left Rotation 48   Strength   Overall Strength Comments B biceps 4+/5, else B shoulder and elbow ext 5/5, Finger ABD L 3+/5, R 5/5;    Strength Assessment Site Other (comment)  R grip 12.5 Kg; L grip 10 Kg   Palpation   Palpation comment B UT,  R cervical paraspinals especially at C2/3/4   Special Tests     Special Tests Cervical   Cervical Tests Dictraction   Distraction Test   Findngs Positive   side Right   Comment decreased pain                   [facility] Adult PT Treatment/Exercise - 03/22/15 0001    Modalities   Modalities Electrical Stimulation   Electrical Stimulation   Electrical Stimulation Location B neck/shoulder   Electrical Stimulation Action premod   Electrical Stimulation Parameters 80-150 hz x 15 min to tolerance   Electrical Stimulation Goals Pain                PT Education - 03/22/15 1437    Education provided Yes   Education Details HEP   Person(s) Educated Patient   Methods Explanation;Demonstration;Handout   Comprehension Verbalized understanding;Returned demonstration          PT Short Term Goals - 03/22/15 1448    PT SHORT TERM GOAL #1   Title I with HEP  (04/05/15)   Time 2   Period Weeks   Status New   PT SHORT TERM GOAL #2   Title decreased pain with neck movement by 25% (04/05/15)   Time 2   Period Weeks   Status New           PT Long Term Goals - 03/22/15 1449    PT LONG TERM GOAL #1   Title improved cervical rotation B to 60 degrees or better    Time 6   Period Weeks   Status New   PT LONG TERM GOAL #2   Title Able to peform ADLs with neck pain 2/10 or less   Time 6   Period Weeks   Status New   PT LONG TERM GOAL #3   Title able to verbalize/demonstrate importance of correct posture in preventing further injury.   Time 6   Period Weeks   Status New               Plan - 03/22/15 1438    Clinical Impression Statement Patient presents with neck pain and decreased ROM affecting ADLS. She has significant postural deficits and increased muscle tone and trigger points due to muscle guarding. Patient responded well to manual distraction, however traction unit was unavailable.  Patient responded well to estim reporting decreased pain with rotation afterwards.   Pt will benefit from skilled therapeutic  intervention in order to improve on the following deficits Decreased range of motion;Pain;Decreased activity tolerance;Impaired flexibility;Postural dysfunction   Rehab Potential Good  PT Frequency 2x / week   PT Duration 6 weeks   PT Treatment/Interventions ADLs/Self Care Home Management;Electrical Stimulation;Moist Heat;Therapeutic exercise;Ultrasound;Traction;Neuromuscular re-education;Patient/family education;Manual techniques;Dry needling;Passive range of motion   PT Next Visit Plan review HEP, [place] to R cspine and STW to B neck and UT, mechanical traction (start low 10-15#).   PT Home Exercise Plan UT, lev scap stretch, cerv and scapular retraction, postural ed and doorway stretch for pecs          G-Codes - 04-05-15 1453    Functional Assessment Tool Used FOTO  56% limited   Functional Limitation Other PT primary   Other PT Primary Current Status (Z0017) At least 40 percent but less than 60 percent impaired, limited or restricted   Other PT Primary Goal Status (C9449) At least 40 percent but less than 60 percent impaired, limited or restricted       Problem List Patient Active Problem List   Diagnosis Date Noted  . Acute GI bleeding   . UGI bleed 01/27/2015  . Acute blood loss anemia 01/27/2015  . Vitamin D deficiency 06/15/2014  . GERD (gastroesophageal reflux disease) 06/15/2014  . Osteopenia 09/02/2013  . Hypothyroidism 06/05/2012  . HLD (hyperlipidemia) 06/05/2012  . Asthma 06/05/2012    [name] PT  05-Apr-2015, 2:59 PM  [facility] [address] [place], [place], [address] Phone: [phone]   Fax:  [phone]  Name: Cheryl Perry MRN: [id number] Date of Birth: Jan 23, 1945

## 2015-03-24 ENCOUNTER — Ambulatory Visit: Payer: Medicare Other | Admitting: *Deleted

## 2015-03-24 DIAGNOSIS — J3089 Other allergic rhinitis: Secondary | ICD-10-CM | POA: Diagnosis not present

## 2015-03-24 DIAGNOSIS — M542 Cervicalgia: Secondary | ICD-10-CM

## 2015-03-24 DIAGNOSIS — M436 Torticollis: Secondary | ICD-10-CM | POA: Diagnosis not present

## 2015-03-24 DIAGNOSIS — J301 Allergic rhinitis due to pollen: Secondary | ICD-10-CM | POA: Diagnosis not present

## 2015-03-24 DIAGNOSIS — J3081 Allergic rhinitis due to animal (cat) (dog) hair and dander: Secondary | ICD-10-CM | POA: Diagnosis not present

## 2015-03-24 NOTE — Therapy (Signed)
[facility] [place], [place], [address] Phone: [phone]   Fax:  [phone]  Physical Therapy Treatment  Patient Details  Name: Cheryl Perry MRN: [id number] Date of Birth: 23-Nov-1945 Referring Provider: Jeri Cos PA-C/Richard Ramos MD  Encounter Date: 03/24/2015      PT End of Session - 03/24/15 1159    Visit Number 2   Number of Visits 12   Date for PT Re-Evaluation 05/03/15   PT Start Time 1115   PT Stop Time 0093   PT Time Calculation (min) 50 min      Past Medical History  Diagnosis Date  . Thyroid disease   . Hyperlipidemia   . Allergy   . Asthma   . Vasculopathy     lymphatic  . Back pain   . Hiatal hernia     Past Surgical History  Procedure Laterality Date  . Tubal ligation    . Knee surgery    . Cystscopy    . Cystoscopy    . Esophagogastroduodenoscopy N/A 01/28/2015    Dr.Rourk- 3 small prepyloric/gastric ulcers- likely culprits causing bleeding. hiatal hernia, small gastic polyps not manipulated.    There were no vitals filed for this visit.  Visit Diagnosis:  Neck pain  Stiffness of neck      Subjective Assessment - 03/24/15 1154    Subjective Patient began experiencing neck pain in Nov 2016 after doing some chores. Neck was very stiff. Right side is worse. Pain runs from subocciptal along upper traps. She states that C6/7 is bad as well as C5/6. Patient has intermittent N/T in the radial LUE and more pain to hand in RUE. Occurs more when she is sitting.   Pertinent History asthma, Osteopenia, herniated disc L5/S1, CTS bil (L > R)   Patient Stated Goals decrease pain, improve movement   Currently in Pain? Yes   Pain Score 4    Pain Location Neck   Pain Orientation Right;Left   Pain Descriptors / Indicators Stabbing   Pain Type Acute pain   Pain Onset More than a month ago   Pain Frequency Intermittent                         [facility] Adult PT Treatment/Exercise  - 03/24/15 0001    Modalities   Modalities Ultrasound;Traction   Ultrasound   Ultrasound Location LT/RT Utraps/ Levator   Ultrasound Parameters 1.5 w/cm2 x12 mins sitting   Ultrasound Goals Pain   Traction   Type of Traction Cervical   Min (lbs) 5   Max (lbs) 13   Hold Time 99   Rest Time 5   Time 15   Manual Therapy   Manual Therapy Soft tissue mobilization;Myofascial release   Soft tissue mobilization STW/ IASTM to LT/RT Utraps and levator, and into cerv paras sitting                  PT Short Term Goals - 03/22/15 1448    PT SHORT TERM GOAL #1   Title I with HEP  (04/05/15)   Time 2   Period Weeks   Status New   PT SHORT TERM GOAL #2   Title decreased pain with neck movement by 25% (04/05/15)   Time 2   Period Weeks   Status New           PT Long Term Goals - 03/22/15 1449    PT LONG TERM GOAL #1  Title improved cervical rotation B to 60 degrees or better    Time 6   Period Weeks   Status New   PT LONG TERM GOAL #2   Title Able to peform ADLs with neck pain 2/10 or less   Time 6   Period Weeks   Status New   PT LONG TERM GOAL #3   Title able to verbalize/demonstrate importance of correct posture in preventing further injury.   Time 6   Period Weeks   Status New               Plan - 03/24/15 1148    Clinical Impression Statement Patient did fairly well with Rx today. She had notable tightness in BIl Utraps and levator scapula. She did well with [place] and  STW to these areas and had decreased tightness after. She tolerated 13#s of cervical traction today and less pain after Rx.   Pt will benefit from skilled therapeutic intervention in order to improve on the following deficits Decreased range of motion;Pain;Decreased activity tolerance;Impaired flexibility;Postural dysfunction   Rehab Potential Good   PT Frequency 2x / week   PT Duration 6 weeks   PT Next Visit Plan review HEP, [place] to R cspine and STW to B neck and UT, mechanical traction  (start low 10-15#). Assess 13#s traction   PT Home Exercise Plan UT, lev scap stretch, cerv and scapular retraction, postural ed and doorway stretch for pecs   Consulted and Agree with Plan of Care Patient        Problem List Patient Active Problem List   Diagnosis Date Noted  . Acute GI bleeding   . UGI bleed 01/27/2015  . Acute blood loss anemia 01/27/2015  . Vitamin D deficiency 06/15/2014  . GERD (gastroesophageal reflux disease) 06/15/2014  . Osteopenia 09/02/2013  . Hypothyroidism 06/05/2012  . HLD (hyperlipidemia) 06/05/2012  . Asthma 06/05/2012    Sheril Hammond,CHRIS, PTA 03/24/2015, 1:06 PM  [facility] [place], [place], [address] Phone: [phone]   Fax:  [phone]  Name: Cheryl Perry MRN: [id number] Date of Birth: January 28, 1945

## 2015-03-29 ENCOUNTER — Ambulatory Visit: Payer: Medicare Other | Admitting: Physical Therapy

## 2015-03-29 DIAGNOSIS — J3089 Other allergic rhinitis: Secondary | ICD-10-CM | POA: Diagnosis not present

## 2015-03-29 DIAGNOSIS — M542 Cervicalgia: Secondary | ICD-10-CM | POA: Diagnosis not present

## 2015-03-29 DIAGNOSIS — M436 Torticollis: Secondary | ICD-10-CM | POA: Diagnosis not present

## 2015-03-29 DIAGNOSIS — J3081 Allergic rhinitis due to animal (cat) (dog) hair and dander: Secondary | ICD-10-CM | POA: Diagnosis not present

## 2015-03-29 DIAGNOSIS — J301 Allergic rhinitis due to pollen: Secondary | ICD-10-CM | POA: Diagnosis not present

## 2015-03-29 NOTE — Therapy (Signed)
[facility] [place], [place], [address] Phone: [phone]   Fax:  [phone]  Physical Therapy Treatment  Patient Details  Name: Cheryl Perry MRN: [id number] Date of Birth: [birth year] Referring Provider: Jeri Cos PA-C/Richard Ramos MD  Encounter Date: 03/29/2015      PT End of Session - 03/29/15 1034    Visit Number 3   Number of Visits 12   Date for PT Re-Evaluation 05/03/15   PT Start Time 1034   PT Stop Time 1132   PT Time Calculation (min) 58 min   Activity Tolerance Patient tolerated treatment well   Behavior During Therapy [facility] for tasks assessed/performed      Past Medical History  Diagnosis Date  . Thyroid disease   . Hyperlipidemia   . Allergy   . Asthma   . Vasculopathy     lymphatic  . Back pain   . Hiatal hernia     Past Surgical History  Procedure Laterality Date  . Tubal ligation    . Knee surgery    . Cystscopy    . Cystoscopy    . Esophagogastroduodenoscopy N/A 01/28/2015    Dr.Rourk- 3 small prepyloric/gastric ulcers- likely culprits causing bleeding. hiatal hernia, small gastic polyps not manipulated.    There were no vitals filed for this visit.  Visit Diagnosis:  Neck pain  Stiffness of neck      Subjective Assessment - 03/29/15 1036    Subjective Patient reports she was a little sore the next day after traction, but overall felt some relief, She states that she had better ROM over the weekend. She also states she may have slept funny last night.   Currently in Pain? Yes   Pain Score 2    Pain Location Neck   Pain Orientation Right;Left   Pain Descriptors / Indicators Stabbing   Pain Type Acute pain   Pain Onset More than a month ago   Pain Frequency Intermittent   Aggravating Factors  moving her head too much or too quickly   Pain Relieving Factors ice, tylenol and rest   Effect of Pain on Daily Activities hurts to move            [facility] PT Assessment -  03/29/15 0001    Assessment   Medical Diagnosis C5/6 disc degeneration; neck pain   Onset Date/Surgical Date 11/09/14   Next MD Visit 04/18/15   ROM / Strength   AROM / PROM / Strength AROM   AROM   AROM Assessment Site Cervical   Cervical Flexion full  without pain after manual today   Cervical Extension full  with slight discomfort only after manual   Cervical - Right Side Bend 20  28 deg after manual   Cervical - Left Side Bend 18  28 deg after manual   Cervical - Right Rotation 70   Cervical - Left Rotation 62                     [facility] Adult PT Treatment/Exercise - 03/29/15 0001    Modalities   Modalities Ultrasound;Traction   Ultrasound   Ultrasound Location B UT and cervical paraspinals   Ultrasound Parameters 1.5 w/cm2 1 mhz cont x 10 min   Ultrasound Goals Pain   Traction   Type of Traction Cervical   Min (lbs) 5   Max (lbs) 15   Hold Time 99   Rest Time 5   Time 15   Manual  Therapy   Manual Therapy Soft tissue mobilization   Soft tissue mobilization to B UT, scalenes and cervical paraspinals                  PT Short Term Goals - 03/29/15 1041    PT SHORT TERM GOAL #1   Title I with HEP  (04/05/15)   Time 2   Period Weeks   Status On-going   PT SHORT TERM GOAL #2   Title decreased pain with neck movement by 25% (04/05/15)   Time 2   Period Weeks   Status On-going           PT Long Term Goals - 03/29/15 1138    PT LONG TERM GOAL #1   Title improved cervical rotation B to 60 degrees or better    Time 6   Period Weeks   Status Achieved   PT LONG TERM GOAL #2   Title Able to peform ADLs with neck pain 2/10 or less   Time 6   Period Weeks   Status On-going   PT LONG TERM GOAL #3   Title able to verbalize/demonstrate importance of correct posture in preventing further injury.   Time 6   Period Weeks   Status On-going               Plan - 03/29/15 1133    Clinical Impression Statement Patient is doing very  well. Rotation goal has been met and pain is decreasing. Left UT still has significant tightness, however greatest pain is on the R. Manual therapy resulted in significant increase in lateral SB ROM. Other goals are ongoing.   Pt will benefit from skilled therapeutic intervention in order to improve on the following deficits Decreased range of motion;Pain;Decreased activity tolerance;Impaired flexibility;Postural dysfunction   Rehab Potential Good   PT Frequency 2x / week   PT Duration 6 weeks   PT Treatment/Interventions ADLs/Self Care Home Management;Electrical Stimulation;Moist Heat;Therapeutic exercise;Ultrasound;Traction;Neuromuscular re-education;Patient/family education;Manual techniques;Dry needling;Passive range of motion   PT Next Visit Plan Continue traction at 15# and [place] to UTs prn.    Consulted and Agree with Plan of Care Patient        Problem List Patient Active Problem List   Diagnosis Date Noted  . Acute GI bleeding   . UGI bleed 01/27/2015  . Acute blood loss anemia 01/27/2015  . Vitamin D deficiency 06/15/2014  . GERD (gastroesophageal reflux disease) 06/15/2014  . Osteopenia 09/02/2013  . Hypothyroidism 06/05/2012  . HLD (hyperlipidemia) 06/05/2012  . Asthma 06/05/2012    [name] PT  03/29/2015, 11:41 AM  [facility] [address] [place], [place], [address] Phone: [phone]   Fax:  [phone]  Name: Cheryl Perry MRN: [id number] Date of Birth: May 04, 1945

## 2015-03-31 ENCOUNTER — Ambulatory Visit: Payer: Medicare Other | Admitting: Physical Therapy

## 2015-03-31 DIAGNOSIS — M436 Torticollis: Secondary | ICD-10-CM

## 2015-03-31 DIAGNOSIS — M542 Cervicalgia: Secondary | ICD-10-CM | POA: Diagnosis not present

## 2015-03-31 NOTE — Therapy (Signed)
[facility] [place], [place], [address] Phone: [phone]   Fax:  [phone]  Physical Therapy Treatment  Patient Details  Name: Cheryl Perry MRN: [id number] Date of Birth: 1945-11-15 Referring Provider: Jeri Cos PA-C/Richard Ramos MD  Encounter Date: 03/31/2015      PT End of Session - 03/31/15 1258    Visit Number 4   Number of Visits 12   Date for PT Re-Evaluation 05/03/15   PT Start Time 1300   PT Stop Time 1359   PT Time Calculation (min) 59 min   Activity Tolerance Patient tolerated treatment well   Behavior During Therapy [facility] for tasks assessed/performed      Past Medical History  Diagnosis Date  . Thyroid disease   . Hyperlipidemia   . Allergy   . Asthma   . Vasculopathy     lymphatic  . Back pain   . Hiatal hernia     Past Surgical History  Procedure Laterality Date  . Tubal ligation    . Knee surgery    . Cystscopy    . Cystoscopy    . Esophagogastroduodenoscopy N/A 01/28/2015    Dr.Rourk- 3 small prepyloric/gastric ulcers- likely culprits causing bleeding. hiatal hernia, small gastic polyps not manipulated.    There were no vitals filed for this visit.  Visit Diagnosis:  Neck pain  Stiffness of neck      Subjective Assessment - 03/31/15 1259    Subjective Patient continues to show improvements and has been able to turn her head much better.   Pertinent History asthma, Osteopenia, herniated disc L5/S1, CTS bil (L > R)   Patient Stated Goals decrease pain, improve movement   Currently in Pain? Yes   Pain Score 1    Pain Location Neck   Pain Orientation Right;Left   Pain Descriptors / Indicators Stabbing                         [facility] Adult PT Treatment/Exercise - 03/31/15 0001    Exercises   Exercises Neck   Neck Exercises: Supine   Neck Retraction 20 reps   Modalities   Modalities Ultrasound   Ultrasound   Ultrasound Location BUT and cervical paraspinals    Ultrasound Parameters 1.5 W/cm2 1 mhz cont x 10 min   Ultrasound Goals Pain   Traction   Type of Traction Cervical   Min (lbs) 5   Max (lbs) 15   Hold Time 99   Rest Time 5   Time 15   Manual Therapy   Manual Therapy Soft tissue mobilization;Passive ROM   Soft tissue mobilization to B UT and cervical paraspinals   Passive ROM B SB prolonged stretches                  PT Short Term Goals - 03/29/15 1041    PT SHORT TERM GOAL #1   Title I with HEP  (04/05/15)   Time 2   Period Weeks   Status On-going   PT SHORT TERM GOAL #2   Title decreased pain with neck movement by 25% (04/05/15)   Time 2   Period Weeks   Status On-going           PT Long Term Goals - 03/29/15 1138    PT LONG TERM GOAL #1   Title improved cervical rotation B to 60 degrees or better    Time 6   Period Weeks  Status Achieved   PT LONG TERM GOAL #2   Title Able to peform ADLs with neck pain 2/10 or less   Time 6   Period Weeks   Status On-going   PT LONG TERM GOAL #3   Title able to verbalize/demonstrate importance of correct posture in preventing further injury.   Time 6   Period Weeks   Status On-going               Plan - 03/31/15 1345    Clinical Impression Statement Patient continues to do wel. She has increased tone in L UT vs. R, but responds well to [place] and manual therapy to decrease tone.   PT Next Visit Plan Continue traction at 15# and [place] to UTs prn.    PT Home Exercise Plan supine retraction        Problem List Patient Active Problem List   Diagnosis Date Noted  . Acute GI bleeding   . UGI bleed 01/27/2015  . Acute blood loss anemia 01/27/2015  . Vitamin D deficiency 06/15/2014  . GERD (gastroesophageal reflux disease) 06/15/2014  . Osteopenia 09/02/2013  . Hypothyroidism 06/05/2012  . HLD (hyperlipidemia) 06/05/2012  . Asthma 06/05/2012    [name] PT  03/31/2015, 1:47 PM  [facility] [address] [place], [place], [address] Phone: [phone]   Fax:  [phone]  Name: Cheryl Perry MRN: [id number] Date of Birth: 03-Dec-1945

## 2015-04-05 ENCOUNTER — Ambulatory Visit: Payer: Medicare Other | Admitting: Physical Therapy

## 2015-04-05 DIAGNOSIS — M436 Torticollis: Secondary | ICD-10-CM

## 2015-04-05 DIAGNOSIS — M542 Cervicalgia: Secondary | ICD-10-CM

## 2015-04-05 NOTE — Therapy (Signed)
[facility] [place], [place], [address] Phone: [phone]   Fax:  [phone]  Physical Therapy Treatment  Patient Details  Name: Cheryl Perry MRN: [id number] Date of Birth: 21-Mar-1945 Referring Provider: Jeri Cos PA-C/Richard Ramos MD  Encounter Date: 04/05/2015      PT End of Session - 04/05/15 1032    Visit Number 5   Number of Visits 12   Date for PT Re-Evaluation 05/03/15   PT Start Time 1155   PT Stop Time 1121   PT Time Calculation (min) 49 min   Activity Tolerance Patient tolerated treatment well   Behavior During Therapy [facility] for tasks assessed/performed      Past Medical History  Diagnosis Date  . Thyroid disease   . Hyperlipidemia   . Allergy   . Asthma   . Vasculopathy     lymphatic  . Back pain   . Hiatal hernia     Past Surgical History  Procedure Laterality Date  . Tubal ligation    . Knee surgery    . Cystscopy    . Cystoscopy    . Esophagogastroduodenoscopy N/A 01/28/2015    Dr.Rourk- 3 small prepyloric/gastric ulcers- likely culprits causing bleeding. hiatal hernia, small gastic polyps not manipulated.    There were no vitals filed for this visit.  Visit Diagnosis:  Neck pain  Stiffness of neck      Subjective Assessment - 04/05/15 1033    Subjective Patient reports she was sore the day after her last treatment. She also responds to changes in the weather which may have caused it as well. Her movement is still much better. She also reports increased tingling in BUE to hands in past 5-6 days.   Patient Stated Goals decrease pain, improve movement   Currently in Pain? Yes   Pain Score 2    Pain Location Neck   Pain Orientation Right;Left   Pain Descriptors / Indicators Stabbing   Pain Type Acute pain   Pain Onset More than a month ago   Pain Frequency Intermittent   Aggravating Factors  Turning her head to the right more than left   Pain Relieving Factors rest             [facility] PT Assessment - 04/05/15 0001    Assessment   Medical Diagnosis C5/6 disc degeneration; neck pain   Onset Date/Surgical Date 11/09/14   Next MD Visit 04/18/15   ROM / Strength   AROM / PROM / Strength AROM   AROM   AROM Assessment Site Cervical   Cervical - Right Rotation 67   Cervical - Left Rotation 58  patient reports increased tingling to digits 3-5 with L rot                     [facility] Adult PT Treatment/Exercise - 04/05/15 0001    Modalities   Modalities Ultrasound;Traction   Ultrasound   Ultrasound Location BUT and cerv paraspinals   Ultrasound Parameters 1.5 W/cm2 1 mhz cont x 10 min   Ultrasound Goals Pain   Traction   Type of Traction Cervical   Min (lbs) 5   Max (lbs) 12   Hold Time 99   Rest Time 5   Time 13  removed 3 min early due to HA.    Manual Therapy   Manual Therapy Soft tissue mobilization;Manual Traction   Manual therapy comments 1 bout in sitting   Soft tissue mobilization to B UT  and cervcial paraspinals                  PT Short Term Goals - 04/05/15 1359    PT SHORT TERM GOAL #1   Title I with HEP  (04/05/15)   Time 2   Period Weeks   Status Achieved   PT SHORT TERM GOAL #2   Title decreased pain with neck movement by 25% (04/05/15)   Time 2   Period Weeks   Status Achieved           PT Long Term Goals - 03/29/15 1138    PT LONG TERM GOAL #1   Title improved cervical rotation B to 60 degrees or better    Time 6   Period Weeks   Status Achieved   PT LONG TERM GOAL #2   Title Able to peform ADLs with neck pain 2/10 or less   Time 6   Period Weeks   Status On-going   PT LONG TERM GOAL #3   Title able to verbalize/demonstrate importance of correct posture in preventing further injury.   Time 6   Period Weeks   Status On-going               Plan - 04/05/15 1310    Clinical Impression Statement Paitent did well with therapy today and tone has definitedly decreased in B UT since  eval. Her cervical rotation to the left was somewhat decreased since last visit and pt reported that pushing it further increased tingling down UE. Manual traction decreased tingling to digits 3 and 4 and paritally 5. Patient responded well to mechanical traction reporting no pain or tingling afterwards. HA was caused by coughing she feels and was gone when she left clinic. STGs have been met. LTGs are ongoing.   Pt will benefit from skilled therapeutic intervention in order to improve on the following deficits Decreased range of motion;Pain;Decreased activity tolerance;Impaired flexibility;Postural dysfunction   Rehab Potential Good   PT Frequency 2x / week   PT Duration 6 weeks   PT Treatment/Interventions ADLs/Self Care Home Management;Electrical Stimulation;Moist Heat;Therapeutic exercise;Ultrasound;Traction;Neuromuscular re-education;Patient/family education;Manual techniques;Dry needling;Passive range of motion   PT Next Visit Plan Assess UE sx, Continue traction if indicated at 12# and [place] to UTs prn. Add scapular stab and cervical stab TE.   Consulted and Agree with Plan of Care Patient        Problem List Patient Active Problem List   Diagnosis Date Noted  . Acute GI bleeding   . UGI bleed 01/27/2015  . Acute blood loss anemia 01/27/2015  . Vitamin D deficiency 06/15/2014  . GERD (gastroesophageal reflux disease) 06/15/2014  . Osteopenia 09/02/2013  . Hypothyroidism 06/05/2012  . HLD (hyperlipidemia) 06/05/2012  . Asthma 06/05/2012    [name] PT  04/05/2015, 2:01 PM  [facility] [address] [place], [place], [address] Phone: [phone]   Fax:  [phone]  Name: Cheryl Perry MRN: [id number] Date of Birth: 01-15-1945

## 2015-04-07 ENCOUNTER — Ambulatory Visit: Payer: Medicare Other | Admitting: *Deleted

## 2015-04-07 DIAGNOSIS — M542 Cervicalgia: Secondary | ICD-10-CM

## 2015-04-07 DIAGNOSIS — J3081 Allergic rhinitis due to animal (cat) (dog) hair and dander: Secondary | ICD-10-CM | POA: Diagnosis not present

## 2015-04-07 DIAGNOSIS — J3089 Other allergic rhinitis: Secondary | ICD-10-CM | POA: Diagnosis not present

## 2015-04-07 DIAGNOSIS — M436 Torticollis: Secondary | ICD-10-CM | POA: Diagnosis not present

## 2015-04-07 DIAGNOSIS — J301 Allergic rhinitis due to pollen: Secondary | ICD-10-CM | POA: Diagnosis not present

## 2015-04-07 NOTE — Therapy (Signed)
[facility] [place], [place], [address] Phone: [phone]   Fax:  [phone]  Physical Therapy Treatment  Patient Details  Name: Cheryl Perry MRN: [id number] Date of Birth: Aug 22, 1945 Referring Provider: Jeri Cos PA-C/Richard Ramos MD  Encounter Date: 04/07/2015      PT End of Session - 04/07/15 1353    Visit Number 6   Number of Visits 12   Date for PT Re-Evaluation 05/03/15   PT Start Time 1300   PT Stop Time 1400   PT Time Calculation (min) 60 min      Past Medical History  Diagnosis Date  . Thyroid disease   . Hyperlipidemia   . Allergy   . Asthma   . Vasculopathy     lymphatic  . Back pain   . Hiatal hernia     Past Surgical History  Procedure Laterality Date  . Tubal ligation    . Knee surgery    . Cystscopy    . Cystoscopy    . Esophagogastroduodenoscopy N/A 01/28/2015    Dr.Rourk- 3 small prepyloric/gastric ulcers- likely culprits causing bleeding. hiatal hernia, small gastic polyps not manipulated.    There were no vitals filed for this visit.  Visit Diagnosis:  Neck pain  Stiffness of neck                       [facility] Adult PT Treatment/Exercise - 04/07/15 0001    Modalities   Modalities Ultrasound;Traction   Ultrasound   Ultrasound Location Bil. Utraps   Ultrasound Parameters 1.5 w/cm2 x 12 mins in sitting   Ultrasound Goals Pain   Traction   Type of Traction Cervical   Min (lbs) 5   Max (lbs) 12   Hold Time 99   Rest Time 5   Time 13   Manual Therapy   Manual Therapy Soft tissue mobilization;Manual Traction   Soft tissue mobilization STW/ IASTM to LT/RT Utraps and levator, and into cerv paras sitting                  PT Short Term Goals - 04/05/15 1359    PT SHORT TERM GOAL #1   Title I with HEP  (04/05/15)   Time 2   Period Weeks   Status Achieved   PT SHORT TERM GOAL #2   Title decreased pain with neck movement by 25% (04/05/15)   Time  2   Period Weeks   Status Achieved           PT Long Term Goals - 03/29/15 1138    PT LONG TERM GOAL #1   Title improved cervical rotation B to 60 degrees or better    Time 6   Period Weeks   Status Achieved   PT LONG TERM GOAL #2   Title Able to peform ADLs with neck pain 2/10 or less   Time 6   Period Weeks   Status On-going   PT LONG TERM GOAL #3   Title able to verbalize/demonstrate importance of correct posture in preventing further injury.   Time 6   Period Weeks   Status On-going               Plan - 04/07/15 1354    Clinical Impression Statement Pt did fairly well with Rx today. She had notable tightness in both Utraps (LT >RT). She did have some good TP relase in both without increased referred pain. She was able  to tolerate cervical traction at 12#s and no complaints of  a HA with  decreased tension at cervical collar.   Pt will benefit from skilled therapeutic intervention in order to improve on the following deficits Decreased range of motion;Pain;Decreased activity tolerance;Impaired flexibility;Postural dysfunction   Rehab Potential Good   PT Frequency 2x / week   PT Duration 6 weeks   PT Treatment/Interventions ADLs/Self Care Home Management;Electrical Stimulation;Moist Heat;Therapeutic exercise;Ultrasound;Traction;Neuromuscular re-education;Patient/family education;Manual techniques;Dry needling;Passive range of motion   PT Next Visit Plan Assess UE sx, Continue traction if indicated at 12# and [place] to UTs prn. Add scapular stab and cervical stab TE.   PT Home Exercise Plan supine retraction   Consulted and Agree with Plan of Care Patient        Problem List Patient Active Problem List   Diagnosis Date Noted  . Acute GI bleeding   . UGI bleed 01/27/2015  . Acute blood loss anemia 01/27/2015  . Vitamin D deficiency 06/15/2014  . GERD (gastroesophageal reflux disease) 06/15/2014  . Osteopenia 09/02/2013  . Hypothyroidism 06/05/2012  . HLD  (hyperlipidemia) 06/05/2012  . Asthma 06/05/2012    Makailyn Mccormick,CHRIS, PTA 04/07/2015, 2:25 PM  [facility] [address] [place], [place], [address] Phone: [phone]   Fax:  [phone]  Name: JAMIRIA LANGILL MRN: [id number] Date of Birth: 1945-08-01

## 2015-04-12 ENCOUNTER — Ambulatory Visit: Payer: Medicare Other | Attending: Physical Medicine and Rehabilitation | Admitting: Physical Therapy

## 2015-04-12 DIAGNOSIS — M436 Torticollis: Secondary | ICD-10-CM

## 2015-04-12 DIAGNOSIS — M542 Cervicalgia: Secondary | ICD-10-CM | POA: Insufficient documentation

## 2015-04-12 DIAGNOSIS — R293 Abnormal posture: Secondary | ICD-10-CM | POA: Diagnosis not present

## 2015-04-12 NOTE — Therapy (Signed)
[facility] [place], [place], [address] Phone: [phone]   Fax:  [phone]  Physical Therapy Treatment  Patient Details  Name: Cheryl Perry MRN: [id number] Date of Birth: 01/21/1945 Referring Provider: Jeri Cos PA-C/Richard Ramos MD  Encounter Date: 04/12/2015      PT End of Session - 04/12/15 1037    Visit Number 7   Number of Visits 12   Date for PT Re-Evaluation 05/03/15   PT Start Time 5400   PT Stop Time 1132   PT Time Calculation (min) 55 min   Activity Tolerance Patient tolerated treatment well   Behavior During Therapy [facility] for tasks assessed/performed      Past Medical History  Diagnosis Date  . Thyroid disease   . Hyperlipidemia   . Allergy   . Asthma   . Vasculopathy     lymphatic  . Back pain   . Hiatal hernia     Past Surgical History  Procedure Laterality Date  . Tubal ligation    . Knee surgery    . Cystscopy    . Cystoscopy    . Esophagogastroduodenoscopy N/A 01/28/2015    Dr.Rourk- 3 small prepyloric/gastric ulcers- likely culprits causing bleeding. hiatal hernia, small gastic polyps not manipulated.    There were no vitals filed for this visit.  Visit Diagnosis:  Neck pain  Stiffness of neck      Subjective Assessment - 04/12/15 1038    Subjective Patient states she is doing better today but yesterday was bad because of the weather front. No reports of N/T since last reported in clinic.   Patient Stated Goals decrease pain, improve movement   Currently in Pain? Yes   Pain Score 1    Pain Location Neck   Pain Orientation Right;Left   Pain Descriptors / Indicators Stabbing   Pain Type Acute pain   Pain Onset More than a month ago                         [facility] Adult PT Treatment/Exercise - 04/12/15 0001    Neck Exercises: Supine   Other Supine Exercise thoracic extension over bolster   Modalities   Modalities Ultrasound;Traction   Ultrasound   Ultrasound Location B UT and cervical paraspinals   Ultrasound Parameters 1.5 w/cm2 1 mhz cont x 10 min   Ultrasound Goals Pain   Traction   Type of Traction Cervical   Min (lbs) 5   Max (lbs) 12   Hold Time 99   Rest Time 5   Time 17   Manual Therapy   Manual Therapy Soft tissue mobilization   Soft tissue mobilization STW B  Utraps and levator, and into cerv paras sitting                PT Education - 04/12/15 1622    Education provided Yes   Education Details hep   Person(s) Educated Patient   Methods Explanation;Demonstration   Comprehension Verbalized understanding;Returned demonstration          PT Short Term Goals - 04/05/15 1359    PT SHORT TERM GOAL #1   Title I with HEP  (04/05/15)   Time 2   Period Weeks   Status Achieved   PT SHORT TERM GOAL #2   Title decreased pain with neck movement by 25% (04/05/15)   Time 2   Period Weeks   Status Achieved  PT Long Term Goals - 03/29/15 1138    PT LONG TERM GOAL #1   Title improved cervical rotation B to 60 degrees or better    Time 6   Period Weeks   Status Achieved   PT LONG TERM GOAL #2   Title Able to peform ADLs with neck pain 2/10 or less   Time 6   Period Weeks   Status On-going   PT LONG TERM GOAL #3   Title able to verbalize/demonstrate importance of correct posture in preventing further injury.   Time 6   Period Weeks   Status On-going               Plan - 04/12/15 1623    Clinical Impression Statement Patient did well with treatment. We discussed DN for remaining trigger points in B UT. Patient to return to MD next Monday and will discuss with him if order not signed yet.   PT Next Visit Plan MD note for appt 04/18/15. Assess goals. Add scapular/cervical TE.   PT Home Exercise Plan thoracic extension over bolster        Problem List Patient Active Problem List   Diagnosis Date Noted  . Acute GI bleeding   . UGI bleed 01/27/2015  . Acute blood loss anemia  01/27/2015  . Vitamin D deficiency 06/15/2014  . GERD (gastroesophageal reflux disease) 06/15/2014  . Osteopenia 09/02/2013  . Hypothyroidism 06/05/2012  . HLD (hyperlipidemia) 06/05/2012  . Asthma 06/05/2012    [name] PT  04/12/2015, 4:31 PM  [facility] [address] [place], [place], [address] Phone: [phone]   Fax:  [phone]  Name: MEGYN LENG MRN: [id number] Date of Birth: 09/06/1945

## 2015-04-14 ENCOUNTER — Ambulatory Visit: Payer: Medicare Other | Admitting: *Deleted

## 2015-04-14 ENCOUNTER — Telehealth: Payer: Self-pay | Admitting: Internal Medicine

## 2015-04-14 DIAGNOSIS — M542 Cervicalgia: Secondary | ICD-10-CM | POA: Diagnosis not present

## 2015-04-14 DIAGNOSIS — M436 Torticollis: Secondary | ICD-10-CM | POA: Diagnosis not present

## 2015-04-14 DIAGNOSIS — R293 Abnormal posture: Secondary | ICD-10-CM | POA: Diagnosis not present

## 2015-04-14 NOTE — Therapy (Signed)
[facility] [place], [place], [address] Phone: [phone]   Fax:  [phone]  Physical Therapy Treatment  Patient Details  Name: Cheryl Perry MRN: [id number] Date of Birth: Jun 05, 1945 Referring Provider: Jeri Cos PA-C/Richard Ramos MD  Encounter Date: 04/14/2015      PT End of Session - 04/14/15 1346    Visit Number 8   Number of Visits 12   Date for PT Re-Evaluation 05/03/15   PT Start Time 1300      Past Medical History  Diagnosis Date  . Thyroid disease   . Hyperlipidemia   . Allergy   . Asthma   . Vasculopathy     lymphatic  . Back pain   . Hiatal hernia     Past Surgical History  Procedure Laterality Date  . Tubal ligation    . Knee surgery    . Cystscopy    . Cystoscopy    . Esophagogastroduodenoscopy N/A 01/28/2015    Dr.Rourk- 3 small prepyloric/gastric ulcers- likely culprits causing bleeding. hiatal hernia, small gastic polyps not manipulated.    There were no vitals filed for this visit.  Visit Diagnosis:  Neck pain  Stiffness of neck      Subjective Assessment - 04/14/15 1306    Subjective Patient states she is doing better today but yesterday was bad because of the weather front. No reports of N/T since last reported in clinic.   Pertinent History asthma, Osteopenia, herniated disc L5/S1, CTS bil (L > R)   Patient Stated Goals decrease pain, improve movement   Currently in Pain? Yes   Pain Score 3    Pain Location Neck   Pain Orientation Right;Left   Pain Descriptors / Indicators Aching   Pain Type Acute pain   Pain Onset More than a month ago   Pain Frequency Intermittent   Pain Relieving Factors PT RXs            [facility] PT Assessment - 04/14/15 0001    AROM   AROM Assessment Site Cervical                     [facility] Adult PT Treatment/Exercise - 04/14/15 0001    Modalities   Modalities Moist Heat;Traction;Ultrasound;Electrical Stimulation   Moist Heat  Therapy   Number Minutes Moist Heat 15 Minutes   Moist Heat Location Cervical   Electrical Stimulation   Electrical Stimulation Location B neck paras/Utraps Premod 80-'150hz'$  x 15 mins 5 secs on/off    Traction   Type of Traction Cervical   Min (lbs) 5   Max (lbs) 12   Hold Time 99   Rest Time 5   Time 15   Manual Therapy   Manual Therapy Soft tissue mobilization   Soft tissue mobilization STW/ IASTM to LT/RT Utraps and levator, and into cerv paras sitting                  PT Short Term Goals - 04/05/15 1359    PT SHORT TERM GOAL #1   Title I with HEP  (04/05/15)   Time 2   Period Weeks   Status Achieved   PT SHORT TERM GOAL #2   Title decreased pain with neck movement by 25% (04/05/15)   Time 2   Period Weeks   Status Achieved           PT Long Term Goals - 03/29/15 1138    PT LONG TERM GOAL #1  Title improved cervical rotation B to 60 degrees or better    Time 6   Period Weeks   Status Achieved   PT LONG TERM GOAL #2   Title Able to peform ADLs with neck pain 2/10 or less   Time 6   Period Weeks   Status On-going   PT LONG TERM GOAL #3   Title able to verbalize/demonstrate importance of correct posture in preventing further injury.   Time 6   Period Weeks   Status On-going               Plan - 04/14/15 1327    Pt will benefit from skilled therapeutic intervention in order to improve on the following deficits Decreased range of motion;Pain;Decreased activity tolerance;Impaired flexibility;Postural dysfunction   Rehab Potential Good   PT Frequency 2x / week   PT Duration 6 weeks   PT Treatment/Interventions ADLs/Self Care Home Management;Electrical Stimulation;Moist Heat;Therapeutic exercise;Ultrasound;Traction;Neuromuscular re-education;Patient/family education;Manual techniques;Dry needling;Passive range of motion   PT Next Visit Plan MD note for appt 04/18/15. Assess goals. Add scapular/cervical TE.   PT Home Exercise Plan thoracic  extension over bolster        Problem List Patient Active Problem List   Diagnosis Date Noted  . Acute GI bleeding   . UGI bleed 01/27/2015  . Acute blood loss anemia 01/27/2015  . Vitamin D deficiency 06/15/2014  . GERD (gastroesophageal reflux disease) 06/15/2014  . Osteopenia 09/02/2013  . Hypothyroidism 06/05/2012  . HLD (hyperlipidemia) 06/05/2012  . Asthma 06/05/2012    Jamorris Ndiaye,CHRIS, PTA 04/14/2015, 1:52 PM  [facility] [address] [place], [place], [address] Phone: [phone]   Fax:  [phone]  Name: Cheryl Perry MRN: [id number] Date of Birth: 06/24/1945

## 2015-04-14 NOTE — Telephone Encounter (Signed)
Pt asked if she could get a refill (enough to get her thru the end of April) of Protonix. She uses [facility] in [place]. [phone]

## 2015-04-14 NOTE — Telephone Encounter (Signed)
Routing to the refill box. 

## 2015-04-14 NOTE — Therapy (Signed)
[facility] [place], [place], [address] Phone: [phone]   Fax:  [phone]  Physical Therapy Treatment  Patient Details  Name: Cheryl Perry MRN: [id number] Date of Birth: [birth year] Referring Provider: Jeri Cos PA-C/Richard Ramos MD  Encounter Date: 04/14/2015      PT End of Session - 04/14/15 1346    Visit Number 8   Number of Visits 12   Date for PT Re-Evaluation 05/03/15   PT Start Time 1300   PT Stop Time 1601   PT Time Calculation (min) 59 min      Past Medical History  Diagnosis Date  . Thyroid disease   . Hyperlipidemia   . Allergy   . Asthma   . Vasculopathy     lymphatic  . Back pain   . Hiatal hernia     Past Surgical History  Procedure Laterality Date  . Tubal ligation    . Knee surgery    . Cystscopy    . Cystoscopy    . Esophagogastroduodenoscopy N/A 01/28/2015    Dr.Rourk- 3 small prepyloric/gastric ulcers- likely culprits causing bleeding. hiatal hernia, small gastic polyps not manipulated.    There were no vitals filed for this visit.  Visit Diagnosis:  Neck pain  Stiffness of neck      Subjective Assessment - 04/14/15 1306    Subjective Patient states she is doing better today but yesterday was bad because of the weather front. No reports of N/T since last reported in clinic.   Pertinent History asthma, Osteopenia, herniated disc L5/S1, CTS bil (L > R)   Patient Stated Goals decrease pain, improve movement   Currently in Pain? Yes   Pain Score 3    Pain Location Neck   Pain Orientation Right;Left   Pain Descriptors / Indicators Aching   Pain Type Acute pain   Pain Onset More than a month ago   Pain Frequency Intermittent   Pain Relieving Factors PT RXs            [facility] PT Assessment - 04/14/15 0001    AROM   AROM Assessment Site Cervical                     [facility] Adult PT Treatment/Exercise - 04/14/15 0001    Modalities   Modalities Moist  Heat;Traction;Ultrasound;Electrical Stimulation   Moist Heat Therapy   Number Minutes Moist Heat 15 Minutes   Moist Heat Location Cervical   Electrical Stimulation   Electrical Stimulation Location B neck paras/Utraps Premod 80-150hz  x 15 mins 5 secs on/off    Traction   Type of Traction Cervical   Min (lbs) 5   Max (lbs) 12   Hold Time 99   Rest Time 5   Time 15   Manual Therapy   Manual Therapy Soft tissue mobilization   Soft tissue mobilization STW/ IASTM to LT/RT Utraps and levator, and into cerv paras sitting                  PT Short Term Goals - 04/05/15 1359    PT SHORT TERM GOAL #1   Title I with HEP  (04/05/15)   Time 2   Period Weeks   Status Achieved   PT SHORT TERM GOAL #2   Title decreased pain with neck movement by 25% (04/05/15)   Time 2   Period Weeks   Status Achieved           PT Long  Term Goals - 03/29/15 1138    PT LONG TERM GOAL #1   Title improved cervical rotation B to 60 degrees or better    Time 6   Period Weeks   Status Achieved   PT LONG TERM GOAL #2   Title Able to peform ADLs with neck pain 2/10 or less   Time 6   Period Weeks   Status On-going   PT LONG TERM GOAL #3   Title able to verbalize/demonstrate importance of correct posture in preventing further injury.   Time 6   Period Weeks   Status On-going               Plan - 04/14/15 1327    Clinical Impression Statement Pt did great with Rx today and feels that she is doing better with cervical ROM and is having less pain. Her cervical rotation to the RT was 68 degrees and to the LT was 50 degrees. To the  RT has been consistant around 70, but to the LT has  varied 10-15 degrees. STGs have been  met, But LTG for ADLs has not been met due to pain   Pt will benefit from skilled therapeutic intervention in order to improve on the following deficits Decreased range of motion;Pain;Decreased activity tolerance;Impaired flexibility;Postural dysfunction   Rehab Potential  Good   PT Frequency 2x / week   PT Duration 6 weeks   PT Treatment/Interventions ADLs/Self Care Home Management;Electrical Stimulation;Moist Heat;Therapeutic exercise;Ultrasound;Traction;Neuromuscular re-education;Patient/family education;Manual techniques;Dry needling;Passive range of motion   PT Next Visit Plan MD note for appt 04/18/15. Assess goals. Add scapular/cervical TE.   PT Home Exercise Plan thoracic extension over bolster        Problem List Patient Active Problem List   Diagnosis Date Noted  . Acute GI bleeding   . UGI bleed 01/27/2015  . Acute blood loss anemia 01/27/2015  . Vitamin D deficiency 06/15/2014  . GERD (gastroesophageal reflux disease) 06/15/2014  . Osteopenia 09/02/2013  . Hypothyroidism 06/05/2012  . HLD (hyperlipidemia) 06/05/2012  . Asthma 06/05/2012    Iara Monds,CHRIS 04/14/2015, 3:54 PM  [facility] [address] [place], [place], [address] Phone: [phone]   Fax:  [phone]  Name: Cheryl Perry MRN: [id number] Date of Birth: 04-16-1945

## 2015-04-15 DIAGNOSIS — J3081 Allergic rhinitis due to animal (cat) (dog) hair and dander: Secondary | ICD-10-CM | POA: Diagnosis not present

## 2015-04-15 DIAGNOSIS — J3089 Other allergic rhinitis: Secondary | ICD-10-CM | POA: Diagnosis not present

## 2015-04-15 DIAGNOSIS — J301 Allergic rhinitis due to pollen: Secondary | ICD-10-CM | POA: Diagnosis not present

## 2015-04-15 MED ORDER — PANTOPRAZOLE SODIUM 40 MG PO TBEC: 40.0000 mg | DELAYED_RELEASE_TABLET | Freq: Two times a day (BID) | ORAL | Status: DC

## 2015-04-15 NOTE — Addendum Note (Signed)
Addended by: Gordy Levan, Helon Wisinski A on: 04/15/2015 03:15 PM   Modules accepted: Orders

## 2015-04-15 NOTE — Telephone Encounter (Signed)
Please notify the patient a 1 month supply of bid Protonix sent to [facility].

## 2015-04-18 DIAGNOSIS — M5136 Other intervertebral disc degeneration, lumbar region: Secondary | ICD-10-CM | POA: Diagnosis not present

## 2015-04-18 DIAGNOSIS — M5442 Lumbago with sciatica, left side: Secondary | ICD-10-CM | POA: Diagnosis not present

## 2015-04-18 DIAGNOSIS — M50322 Other cervical disc degeneration at C5-C6 level: Secondary | ICD-10-CM | POA: Diagnosis not present

## 2015-04-18 DIAGNOSIS — M542 Cervicalgia: Secondary | ICD-10-CM | POA: Diagnosis not present

## 2015-04-19 ENCOUNTER — Ambulatory Visit: Payer: Medicare Other | Admitting: Physical Therapy

## 2015-04-19 DIAGNOSIS — R293 Abnormal posture: Secondary | ICD-10-CM

## 2015-04-19 DIAGNOSIS — M542 Cervicalgia: Secondary | ICD-10-CM | POA: Diagnosis not present

## 2015-04-19 DIAGNOSIS — M436 Torticollis: Secondary | ICD-10-CM | POA: Diagnosis not present

## 2015-04-19 NOTE — Telephone Encounter (Signed)
Pt is aware.  

## 2015-04-19 NOTE — Therapy (Signed)
[facility] [place], [place], [address] Phone: [phone]   Fax:  [phone]  Physical Therapy Treatment  Patient Details  Name: Cheryl Perry MRN: [id number] Date of Birth: November 23, 1945 Referring Provider: Jeri Cos PA-C/Richard Ramos MD  Encounter Date: 04/19/2015      PT End of Session - 04/19/15 1225    Visit Number 9   Number of Visits 12   Date for PT Re-Evaluation 05/03/15   PT Start Time 1030   PT Stop Time 1119   PT Time Calculation (min) 49 min   Activity Tolerance Patient tolerated treatment well   Behavior During Therapy [facility] for tasks assessed/performed      Past Medical History  Diagnosis Date  . Thyroid disease   . Hyperlipidemia   . Allergy   . Asthma   . Vasculopathy     lymphatic  . Back pain   . Hiatal hernia     Past Surgical History  Procedure Laterality Date  . Tubal ligation    . Knee surgery    . Cystscopy    . Cystoscopy    . Esophagogastroduodenoscopy N/A 01/28/2015    Dr.Rourk- 3 small prepyloric/gastric ulcers- likely culprits causing bleeding. hiatal hernia, small gastic polyps not manipulated.    There were no vitals filed for this visit.      Subjective Assessment - 04/19/15 1741    Subjective These treatments are helping.  I think Almyra Free was going to try dry needling on me.   Pertinent History asthma, Osteopenia, herniated disc L5/S1, CTS bil (L > R)   Patient Stated Goals decrease pain, improve movement   Pain Score 3    Pain Location Neck   Pain Orientation Right;Left   Pain Descriptors / Indicators Aching   Pain Type Acute pain   Pain Onset More than a month ago                         [facility] Adult PT Treatment/Exercise - 04/19/15 0001    Modalities   Modalities Electrical Stimulation;Moist Heat   Moist Heat Therapy   Number Minutes Moist Heat 15 Minutes   Moist Heat Location Cervical   Electrical Stimulation   Electrical Stimulation  Location Bil cervical musculature/UT's   Electrical Stimulation Action IFC @ 80-150 Hz x 15 minutes.   Electrical Stimulation Goals Pain   Traction   Type of Traction Cervical   Min (lbs) 5   Max (lbs) 13   Hold Time 99   Rest Time 5   Time 15   Manual Therapy   Manual therapy comments In prone STW/M and gentle manual traction x 10 minutes.                  PT Short Term Goals - 04/05/15 1359    PT SHORT TERM GOAL #1   Title I with HEP  (04/05/15)   Time 2   Period Weeks   Status Achieved   PT SHORT TERM GOAL #2   Title decreased pain with neck movement by 25% (04/05/15)   Time 2   Period Weeks   Status Achieved           PT Long Term Goals - 03/29/15 1138    PT LONG TERM GOAL #1   Title improved cervical rotation B to 60 degrees or better    Time 6   Period Weeks   Status Achieved   PT LONG TERM  GOAL #2   Title Able to peform ADLs with neck pain 2/10 or less   Time 6   Period Weeks   Status On-going   PT LONG TERM GOAL #3   Title able to verbalize/demonstrate importance of correct posture in preventing further injury.   Time 6   Period Weeks   Status On-going             Patient will benefit from skilled therapeutic intervention in order to improve the following deficits and impairments:     Visit Diagnosis: Neck pain  Abnormal posture     Problem List Patient Active Problem List   Diagnosis Date Noted  . Acute GI bleeding   . UGI bleed 01/27/2015  . Acute blood loss anemia 01/27/2015  . Vitamin D deficiency 06/15/2014  . GERD (gastroesophageal reflux disease) 06/15/2014  . Osteopenia 09/02/2013  . Hypothyroidism 06/05/2012  . HLD (hyperlipidemia) 06/05/2012  . Asthma 06/05/2012    Brack Shaddock, [place] MPT 04/19/2015, 5:46 PM  [facility] [address] [place], [place], [address] Phone: [phone]   Fax:  [phone]  Name: Cheryl Perry MRN: [id number] Date of Birth:  [birth year]

## 2015-04-20 DIAGNOSIS — J3081 Allergic rhinitis due to animal (cat) (dog) hair and dander: Secondary | ICD-10-CM | POA: Diagnosis not present

## 2015-04-20 DIAGNOSIS — J3089 Other allergic rhinitis: Secondary | ICD-10-CM | POA: Diagnosis not present

## 2015-04-20 DIAGNOSIS — J301 Allergic rhinitis due to pollen: Secondary | ICD-10-CM | POA: Diagnosis not present

## 2015-04-21 ENCOUNTER — Ambulatory Visit: Payer: Medicare Other | Admitting: *Deleted

## 2015-04-21 DIAGNOSIS — R293 Abnormal posture: Secondary | ICD-10-CM

## 2015-04-21 DIAGNOSIS — M542 Cervicalgia: Secondary | ICD-10-CM | POA: Diagnosis not present

## 2015-04-21 DIAGNOSIS — M436 Torticollis: Secondary | ICD-10-CM | POA: Diagnosis not present

## 2015-04-21 NOTE — Therapy (Signed)
[facility] [place], [place], [address] Phone: [phone]   Fax:  [phone]  Physical Therapy Treatment  Patient Details  Name: Cheryl Perry MRN: [id number] Date of Birth: [birth year] Referring Provider: Jeri Cos PA-C/Richard Ramos MD  Encounter Date: 04/21/2015      PT End of Session - 04/21/15 1156    Visit Number 10   Number of Visits 12   Date for PT Re-Evaluation 05/03/15   PT Start Time 1115   PT Stop Time 1829   PT Time Calculation (min) 50 min      Past Medical History  Diagnosis Date  . Thyroid disease   . Hyperlipidemia   . Allergy   . Asthma   . Vasculopathy     lymphatic  . Back pain   . Hiatal hernia     Past Surgical History  Procedure Laterality Date  . Tubal ligation    . Knee surgery    . Cystscopy    . Cystoscopy    . Esophagogastroduodenoscopy N/A 01/28/2015    Dr.Rourk- 3 small prepyloric/gastric ulcers- likely culprits causing bleeding. hiatal hernia, small gastic polyps not manipulated.    There were no vitals filed for this visit.      Subjective Assessment - 04/21/15 1123    Subjective These treatments are helping.  I think Almyra Free was going to try dry needling on me. Didnt sleep good and now neck hurts. Went to MD and he said to cont PT Send [facility] recert   Pertinent History asthma, Osteopenia, herniated disc L5/S1, CTS bil (L > R)   Patient Stated Goals decrease pain, improve movement   Currently in Pain? Yes   Pain Score 4    Pain Location Neck   Pain Orientation Right;Left   Pain Descriptors / Indicators Aching   Pain Type Acute pain   Pain Onset More than a month ago   Pain Frequency Intermittent                         [facility] Adult PT Treatment/Exercise - 04/21/15 0001    Modalities   Modalities Electrical Stimulation;Moist Heat   Moist Heat Therapy   Number Minutes Moist Heat 15 Minutes   Moist Heat Location Cervical   Electrical Stimulation    Electrical Stimulation Location B neck paras/Utraps Premod 80-'150hz'$  x 15 mins 5 secs on/off    Electrical Stimulation Goals Pain   Traction   Type of Traction Cervical   Min (lbs) 5   Max (lbs) 13   Hold Time 99   Rest Time 5   Time 15   Manual Therapy   Manual Therapy Soft tissue mobilization   Soft tissue mobilization STW/ IASTM to LT/RT Utraps and levator, and into cerv paras sitting                  PT Short Term Goals - 04/05/15 1359    PT SHORT TERM GOAL #1   Title I with HEP  (04/05/15)   Time 2   Period Weeks   Status Achieved   PT SHORT TERM GOAL #2   Title decreased pain with neck movement by 25% (04/05/15)   Time 2   Period Weeks   Status Achieved           PT Long Term Goals - 03/29/15 1138    PT LONG TERM GOAL #1   Title improved cervical rotation B to 60 degrees or  better    Time 6   Period Weeks   Status Achieved   PT LONG TERM GOAL #2   Title Able to peform ADLs with neck pain 2/10 or less   Time 6   Period Weeks   Status On-going   PT LONG TERM GOAL #3   Title able to verbalize/demonstrate importance of correct posture in preventing further injury.   Time 6   Period Weeks   Status On-going               Plan - 2015/05/06 1151    Clinical Impression Statement Pt did fairly well with Rx today, but had increased pain and tightness in her neck when waking this morning. She did well with cervical traction at 12#s again and her cervical ROM is about the same 68 degrees to the RT and LT 50 degrees.     Rehab Potential Good   PT Frequency 2x / week   PT Duration 6 weeks   PT Treatment/Interventions ADLs/Self Care Home Management;Electrical Stimulation;Moist Heat;Therapeutic exercise;Ultrasound;Traction;Neuromuscular re-education;Patient/family education;Manual techniques;Dry needling;Passive range of motion   PT Next Visit Plan 2 more visits and then Pt to have MRI and put on hold   PT Home Exercise Plan thoracic extension over bolster    Consulted and Agree with Plan of Care Patient      Patient will benefit from skilled therapeutic intervention in order to improve the following deficits and impairments:  Decreased range of motion, Pain, Decreased activity tolerance, Impaired flexibility, Postural dysfunction  Visit Diagnosis: Cervicalgia  Abnormal posture       G-Codes - 05-06-15 1156    Functional Assessment Tool Used 10th visit FOTO 48% limited      Problem List Patient Active Problem List   Diagnosis Date Noted  . Acute GI bleeding   . UGI bleed 01/27/2015  . Acute blood loss anemia 01/27/2015  . Vitamin D deficiency 06/15/2014  . GERD (gastroesophageal reflux disease) 06/15/2014  . Osteopenia 09/02/2013  . Hypothyroidism 06/05/2012  . HLD (hyperlipidemia) 06/05/2012  . Asthma 06/05/2012    RAMSEUR,CHRIS, PTA 05/06/2015, 12:10 PM  [facility] [address] [place], [place], [address] Phone: [phone]   Fax:  [phone]  Name: Cheryl Perry MRN: [id number] Date of Birth: 01-27-1945

## 2015-04-26 ENCOUNTER — Encounter: Payer: Self-pay | Admitting: Internal Medicine

## 2015-04-26 ENCOUNTER — Ambulatory Visit: Payer: Medicare Other | Admitting: *Deleted

## 2015-04-26 DIAGNOSIS — M542 Cervicalgia: Secondary | ICD-10-CM | POA: Diagnosis not present

## 2015-04-26 DIAGNOSIS — R293 Abnormal posture: Secondary | ICD-10-CM

## 2015-04-26 DIAGNOSIS — M436 Torticollis: Secondary | ICD-10-CM | POA: Diagnosis not present

## 2015-04-26 NOTE — Therapy (Signed)
[facility] [place], [place], [address] Phone: [phone]   Fax:  [phone]  Physical Therapy Treatment  Patient Details  Name: Cheryl Perry MRN: [id number] Date of Birth: Dec 30, 1945 Referring Provider: Jeri Cos PA-C/Richard Ramos MD  Encounter Date: 04/26/2015      PT End of Session - 04/26/15 1307    Visit Number 11   Number of Visits 12   Date for PT Re-Evaluation 05/03/15   PT Start Time 4665   PT Stop Time 1405   PT Time Calculation (min) 60 min      Past Medical History  Diagnosis Date  . Thyroid disease   . Hyperlipidemia   . Allergy   . Asthma   . Vasculopathy     lymphatic  . Back pain   . Hiatal hernia     Past Surgical History  Procedure Laterality Date  . Tubal ligation    . Knee surgery    . Cystscopy    . Cystoscopy    . Esophagogastroduodenoscopy N/A 01/28/2015    Dr.Rourk- 3 small prepyloric/gastric ulcers- likely culprits causing bleeding. hiatal hernia, small gastic polyps not manipulated.    There were no vitals filed for this visit.      Subjective Assessment - 04/26/15 1306    Subjective These treatments are helping.  I think Almyra Free was going to try dry needling on me.  Only a 2/10 neck pain today.   Went to MD and he said to cont PT Send [facility] recert   Pertinent History asthma, Osteopenia, herniated disc L5/S1, CTS bil (L > R)   Patient Stated Goals decrease pain, improve movement   Currently in Pain? Yes   Pain Score 2    Pain Location Neck   Pain Orientation Right;Left   Pain Descriptors / Indicators Aching   Pain Type Acute pain   Pain Onset More than a month ago                         [facility] Adult PT Treatment/Exercise - 04/26/15 0001    Modalities   Modalities Electrical Stimulation;Moist Heat   Moist Heat Therapy   Number Minutes Moist Heat 15 Minutes   Moist Heat Location Cervical   Electrical Stimulation   Electrical Stimulation Location B  neck paras/Utraps Premod 80-'150hz'$  x 15 mins 5 secs on/off    Electrical Stimulation Goals Pain   Traction   Type of Traction Cervical   Min (lbs) 5   Max (lbs) 13   Hold Time 99   Rest Time 5   Time 15   Manual Therapy   Manual Therapy Soft tissue mobilization   Soft tissue mobilization STW/ IASTM to LT/RT Utraps and levator, and into cerv paras sitting with Focus on Utraps and levator                  PT Short Term Goals - 04/05/15 1359    PT SHORT TERM GOAL #1   Title I with HEP  (04/05/15)   Time 2   Period Weeks   Status Achieved   PT SHORT TERM GOAL #2   Title decreased pain with neck movement by 25% (04/05/15)   Time 2   Period Weeks   Status Achieved           PT Long Term Goals - 03/29/15 1138    PT LONG TERM GOAL #1   Title improved cervical rotation B to  60 degrees or better    Time 6   Period Weeks   Status Achieved   PT LONG TERM GOAL #2   Title Able to peform ADLs with neck pain 2/10 or less   Time 6   Period Weeks   Status On-going   PT LONG TERM GOAL #3   Title able to verbalize/demonstrate importance of correct posture in preventing further injury.   Time 6   Period Weeks   Status On-going               Plan - 04/26/15 1339    Clinical Impression Statement Pt did fairly wee with Rx again today and has had less pain in her neck the last few days. She had notable TPs and tightness in Both Utraps and levator, but also had good releases. She has progressed and will hsave 1 more visit before having MRI.   Rehab Potential Good   PT Frequency 2x / week   PT Duration 6 weeks   PT Treatment/Interventions ADLs/Self Care Home Management;Electrical Stimulation;Moist Heat;Therapeutic exercise;Ultrasound;Traction;Neuromuscular re-education;Patient/family education;Manual techniques;Dry needling;Passive range of motion   PT Next Visit Plan 1 more visit and then Pt to have MRI and put on hold   PT Home Exercise Plan thoracic extension over  bolster   Consulted and Agree with Plan of Care Patient      Patient will benefit from skilled therapeutic intervention in order to improve the following deficits and impairments:     Visit Diagnosis: Cervicalgia  Abnormal posture     Problem List Patient Active Problem List   Diagnosis Date Noted  . Acute GI bleeding   . UGI bleed 01/27/2015  . Acute blood loss anemia 01/27/2015  . Vitamin D deficiency 06/15/2014  . GERD (gastroesophageal reflux disease) 06/15/2014  . Osteopenia 09/02/2013  . Hypothyroidism 06/05/2012  . HLD (hyperlipidemia) 06/05/2012  . Asthma 06/05/2012    Traivon Morrical,CHRIS, PTA 04/26/2015, 2:06 PM  [facility] [place], [place], [address] Phone: [phone]   Fax:  [phone]  Name: Cheryl Perry MRN: [id number] Date of Birth: 13-Apr-1945

## 2015-04-28 ENCOUNTER — Ambulatory Visit: Payer: Medicare Other | Admitting: *Deleted

## 2015-04-28 DIAGNOSIS — M436 Torticollis: Secondary | ICD-10-CM | POA: Diagnosis not present

## 2015-04-28 DIAGNOSIS — R293 Abnormal posture: Secondary | ICD-10-CM | POA: Diagnosis not present

## 2015-04-28 DIAGNOSIS — M542 Cervicalgia: Secondary | ICD-10-CM | POA: Diagnosis not present

## 2015-04-28 NOTE — Therapy (Addendum)
[facility] [place], [place], [address] Phone: [phone]   Fax:  [phone]  Physical Therapy Treatment  Patient Details  Name: Cheryl Perry MRN: [id number] Date of Birth: [birth year] Referring Provider: Jeri Cos PA-C/Richard Ramos MD  Encounter Date: 04/28/2015    Past Medical History:  Diagnosis Date  . Allergy   . Asthma   . Back pain   . Hiatal hernia   . Hyperlipidemia   . Osteopenia   . Thyroid disease   . Vasculopathy    lymphatic    Past Surgical History:  Procedure Laterality Date  . COLONOSCOPY    . CYSTOSCOPY    . cystscopy    . ESOPHAGOGASTRODUODENOSCOPY N/A 01/28/2015   Dr.Rourk- 3 small prepyloric/gastric ulcers- likely culprits causing bleeding. hiatal hernia, small gastic polyps not manipulated.  . ESOPHAGOGASTRODUODENOSCOPY N/A 05/26/2015   Procedure: ESOPHAGOGASTRODUODENOSCOPY (EGD);  Surgeon: Daneil Dolin, MD;  Location: [facility];  Service: Endoscopy;  Laterality: N/A;  1145  . KNEE SURGERY    . TONSILLECTOMY    . TUBAL LIGATION      There were no vitals filed for this visit.                                 PT Short Term Goals - 04/28/15 1040      PT SHORT TERM GOAL #1   Title I with HEP  (04/05/15)   Time 2   Status Achieved     PT SHORT TERM GOAL #2   Title decreased pain with neck movement by 25% (04/05/15)   Period Weeks   Status Achieved           PT Long Term Goals - 04/28/15 1040      PT LONG TERM GOAL #1   Title improved cervical rotation B to 60 degrees or better    Time 6   Period Weeks   Status Achieved     PT LONG TERM GOAL #2   Title Able to peform ADLs with neck pain 2/10 or less   Time 6   Period Weeks   Status On-going     PT LONG TERM GOAL #3   Title able to verbalize/demonstrate importance of correct posture in preventing further injury.   Time 6   Period Weeks   Status Achieved              Patient will benefit from skilled therapeutic intervention in order to improve the following deficits and impairments:  Decreased range of motion, Pain, Decreased activity tolerance, Impaired flexibility, Postural dysfunction  Visit Diagnosis: Cervicalgia  Abnormal posture     Problem List Patient Active Problem List   Diagnosis Date Noted  . Overweight (BMI 25.0-29.9) 06/14/2015  . Gastric ulceration   . Hiatal hernia   . History of lower GI bleeding   . UGI bleed 01/27/2015  . Acute blood loss anemia 01/27/2015  . Vitamin D deficiency 06/15/2014  . GERD (gastroesophageal reflux disease) 06/15/2014  . Osteopenia 09/02/2013  . Hypothyroidism 06/05/2012  . HLD (hyperlipidemia) 06/05/2012  . Asthma 06/05/2012    APPLE[place], [place], PTA 01/26/2016, 12:23 PM  [facility] [address] [place], [place], [address] Phone: [phone]   Fax:  [phone]  Name: Cheryl Perry MRN: [id number] Date of Birth: 25-Mar-1945  PHYSICAL THERAPY DISCHARGE SUMMARY  Visits from Start of Care: 12.  Current functional level  related to goals / functional outcomes: See above.   Remaining deficits: Good progress with some continued neck pain.   Education / Equipment: HEP. Plan: Patient agrees to discharge.  Patient goals were partially met. Patient is being discharged due to being pleased with the current functional level.  ?????        Mali Applegate MPT

## 2015-04-29 DIAGNOSIS — J3089 Other allergic rhinitis: Secondary | ICD-10-CM | POA: Diagnosis not present

## 2015-04-29 DIAGNOSIS — J301 Allergic rhinitis due to pollen: Secondary | ICD-10-CM | POA: Diagnosis not present

## 2015-04-29 DIAGNOSIS — J3081 Allergic rhinitis due to animal (cat) (dog) hair and dander: Secondary | ICD-10-CM | POA: Diagnosis not present

## 2015-05-04 DIAGNOSIS — J3081 Allergic rhinitis due to animal (cat) (dog) hair and dander: Secondary | ICD-10-CM | POA: Diagnosis not present

## 2015-05-04 DIAGNOSIS — J3089 Other allergic rhinitis: Secondary | ICD-10-CM | POA: Diagnosis not present

## 2015-05-04 DIAGNOSIS — J301 Allergic rhinitis due to pollen: Secondary | ICD-10-CM | POA: Diagnosis not present

## 2015-05-12 DIAGNOSIS — J3089 Other allergic rhinitis: Secondary | ICD-10-CM | POA: Diagnosis not present

## 2015-05-12 DIAGNOSIS — J301 Allergic rhinitis due to pollen: Secondary | ICD-10-CM | POA: Diagnosis not present

## 2015-05-12 DIAGNOSIS — J3081 Allergic rhinitis due to animal (cat) (dog) hair and dander: Secondary | ICD-10-CM | POA: Diagnosis not present

## 2015-05-13 ENCOUNTER — Other Ambulatory Visit: Payer: Self-pay

## 2015-05-13 ENCOUNTER — Encounter: Payer: Self-pay | Admitting: Internal Medicine

## 2015-05-13 ENCOUNTER — Ambulatory Visit (INDEPENDENT_AMBULATORY_CARE_PROVIDER_SITE_OTHER): Payer: Medicare Other | Admitting: Internal Medicine

## 2015-05-13 VITALS — BP 110/73 | HR 63 | Temp 98.1°F | Ht 66.5 in | Wt 169.0 lb

## 2015-05-13 DIAGNOSIS — T39395A Adverse effect of other nonsteroidal anti-inflammatory drugs [NSAID], initial encounter: Secondary | ICD-10-CM

## 2015-05-13 DIAGNOSIS — K259 Gastric ulcer, unspecified as acute or chronic, without hemorrhage or perforation: Secondary | ICD-10-CM

## 2015-05-13 DIAGNOSIS — D62 Acute posthemorrhagic anemia: Secondary | ICD-10-CM | POA: Diagnosis not present

## 2015-05-13 NOTE — Progress Notes (Signed)
Primary Care Physician:  [name], FNP Primary Gastroenterologist:  Dr. Gala Romney  Pre-Procedure History & Physical: HPI:  Cheryl Perry is a 70 y.o. female here for follow-up secondary to upper GI bleed earlier in the year secondary to multiple NSAID-induced gastric ulcers. Has not had followup EGD as of yet. She scraped taking all nonsteroidals. Has had no melena or rectal bleeding  - hemoglobin came up into the 11 range. She's not had a follow-up CBC in several weeks. She continues on Protonix 40 mg twice daily. She reminded me Dr. Earlean Shawl did her colonoscopy a couple years ago which was negative. She's not due for another one for several years. H. Pylori serologies came back negative.  Past Medical History  Diagnosis Date  . Thyroid disease   . Hyperlipidemia   . Allergy   . Asthma   . Vasculopathy     lymphatic  . Back pain   . Hiatal hernia     Past Surgical History  Procedure Laterality Date  . Tubal ligation    . Knee surgery    . Cystscopy    . Cystoscopy    . Esophagogastroduodenoscopy N/A 01/28/2015    Dr.Deshane Cotroneo- 3 small prepyloric/gastric ulcers- likely culprits causing bleeding. hiatal hernia, small gastic polyps not manipulated.    Prior to Admission medications   Medication Sig Start Date End Date Taking? Authorizing Provider  albuterol (PROVENTIL HFA) 108 (90 BASE) MCG/ACT inhaler Inhale 2 puffs into the lungs every 6 (six) hours as needed for wheezing. May use only 3 times a month   Yes Historical Provider, MD  Biotin 300 MCG TABS Take 300 mcg by mouth daily.    Yes Historical Provider, MD  Cholecalciferol (VITAMIN D) 2000 UNITS CAPS Take 1 capsule by mouth daily.   Yes Historical Provider, MD  fluticasone-salmeterol (ADVAIR HFA) 45-21 MCG/ACT inhaler Inhale 2 puffs into the lungs 2 (two) times daily. 12/14/14  Yes Sharion Balloon, FNP  hyoscyamine (LEVSIN, ANASPAZ) 0.125 MG tablet Take 0.125 mg by mouth every 4 (four) hours as needed for cramping.    Yes  Historical Provider, MD  levothyroxine (SYNTHROID, LEVOTHROID) 75 MCG tablet TAKE 1 TABLET (75 MCG TOTAL)  BY MOUTH DAILY BEFORE BREAKFAST. 12/14/14  Yes Sharion Balloon, FNP  loratadine (CLARITIN) 10 MG tablet Take 10 mg by mouth daily.   Yes Historical Provider, MD  methocarbamol (ROBAXIN) 500 MG tablet Take 1 tablet (500 mg total) by mouth 3 (three) times daily. Patient taking differently: Take 500 mg by mouth every 8 (eight) hours as needed for muscle spasms.  12/14/14  Yes Sharion Balloon, FNP  montelukast (SINGULAIR) 10 MG tablet Take 10 mg by mouth at bedtime.   Yes Historical Provider, MD  pantoprazole (PROTONIX) 40 MG tablet Take 1 tablet (40 mg total) by mouth 2 (two) times daily before a meal. 04/15/15  Yes Carlis Stable, NP  Probiotic Product (PROBIOTIC ADVANCED PO) Take 1 capsule by mouth daily.    Yes Historical Provider, MD  Niacin-Lovastatin (ADVICOR) 1000-40 MG TB24 Take 1 tablet by mouth at bedtime. Patient not taking: Reported on 05/13/2015 06/15/14   Sharion Balloon, FNP    Allergies as of 05/13/2015 - Review Complete 05/13/2015  Allergen Reaction Noted  . Naproxen Hives and Itching 09/02/2013  . Ampicillin  06/05/2012  . Avelox [moxifloxacin hcl in nacl]  06/05/2012  . Bactrim [sulfamethoxazole-trimethoprim]  06/05/2012  . Levaquin [levofloxacin in d5w]  06/05/2012  . Lipitor [atorvastatin]  06/05/2012  .  Macrodantin [nitrofurantoin macrocrystal]  06/05/2012  . Premarin [conjugated estrogens]  06/05/2012  . Trental [pentoxifylline er]  06/05/2012    Family History  Problem Relation Age of Onset  . Stroke Mother     Social History   Social History  . Marital Status: Married    Spouse Name: N/A  . Number of Children: N/A  . Years of Education: N/A   Occupational History  . Not on file.   Social History Main Topics  . Smoking status: Former Smoker    Quit date: 06/06/1974  . Smokeless tobacco: Not on file     Comment: Quit x 40 years ago  . Alcohol Use: No  .  Drug Use: No  . Sexual Activity: Not on file   Other Topics Concern  . Not on file   Social History Narrative    Review of Systems: See HPI, otherwise negative ROS  Physical Exam: BP 110/73 mmHg  Pulse 63  Temp(Src) 98.1 F (36.7 C) (Oral)  Ht 5' 6.5" (1.689 m)  Wt 169 lb (76.658 kg)  BMI 26.87 kg/m2 General:   Alert,  Well-developed, well-nourished, pleasant and cooperative in NAD Skin:  Intact without significant lesions or rashes. Neck:  Supple; no masses or thyromegaly. No significant cervical adenopathy. Lungs:  Clear throughout to auscultation.   No wheezes, crackles, or rhonchi. No acute distress. Heart:  Regular rate and rhythm; no murmurs, clicks, rubs,  or gallops. Abdomen: Non-distended, normal bowel sounds.  Soft and nontender without appreciable mass or hepatosplenomegaly.  Pulses:  Normal pulses noted. Extremities:  Without clubbing or edema.  Impression:  Pleasant 70 year old lady with a significant upper GI bleed secondary to multiple NSAID-induced gastric ulcers. Clinically, doing well with cessation of NSAIDs and twice a day PPI therapy. It is appropriate to schedule  A follow-up EGD to verify healing at this time.  Recommendations:  Schedule surveillance EGD - follow-up on Gastric ulcer.  The risks, benefits, limitations, alternatives and imponderables have been reviewed with the patient. Potential for esophageal dilation, biopsy, etc. have also been reviewed.  Questions have been answered. All parties agreeable.  CBC on day of procedure  Further recommendations to follow    Notice: This dictation was prepared with Dragon dictation along with smaller phrase technology. Any transcriptional errors that result from this process are unintentional and may not be corrected upon review.

## 2015-05-13 NOTE — Patient Instructions (Signed)
Schedule surveillance EGD - follow-up on Gastric ulcer  CBC on day of procedure  Further recommendations to follow

## 2015-05-16 ENCOUNTER — Other Ambulatory Visit: Payer: Self-pay | Admitting: Nurse Practitioner

## 2015-05-19 DIAGNOSIS — J3081 Allergic rhinitis due to animal (cat) (dog) hair and dander: Secondary | ICD-10-CM | POA: Diagnosis not present

## 2015-05-19 DIAGNOSIS — J301 Allergic rhinitis due to pollen: Secondary | ICD-10-CM | POA: Diagnosis not present

## 2015-05-19 DIAGNOSIS — J3089 Other allergic rhinitis: Secondary | ICD-10-CM | POA: Diagnosis not present

## 2015-05-26 ENCOUNTER — Ambulatory Visit (HOSPITAL_COMMUNITY)
Admission: RE | Admit: 2015-05-26 | Discharge: 2015-05-26 | Disposition: A | Discharge status: Home / Self Care | Payer: Medicare Other | Source: Ambulatory Visit | Attending: Internal Medicine | Admitting: Internal Medicine

## 2015-05-26 ENCOUNTER — Encounter (HOSPITAL_COMMUNITY): Payer: Self-pay | Admitting: *Deleted

## 2015-05-26 ENCOUNTER — Encounter (HOSPITAL_COMMUNITY)
Admission: RE | Disposition: A | Discharge status: Home / Self Care | Payer: Self-pay | Source: Ambulatory Visit | Attending: Internal Medicine

## 2015-05-26 DIAGNOSIS — Z7951 Long term (current) use of inhaled steroids: Secondary | ICD-10-CM | POA: Diagnosis not present

## 2015-05-26 DIAGNOSIS — E079 Disorder of thyroid, unspecified: Secondary | ICD-10-CM | POA: Insufficient documentation

## 2015-05-26 DIAGNOSIS — Z79899 Other long term (current) drug therapy: Secondary | ICD-10-CM | POA: Diagnosis not present

## 2015-05-26 DIAGNOSIS — Z8711 Personal history of peptic ulcer disease: Secondary | ICD-10-CM | POA: Diagnosis not present

## 2015-05-26 DIAGNOSIS — J45909 Unspecified asthma, uncomplicated: Secondary | ICD-10-CM | POA: Diagnosis not present

## 2015-05-26 DIAGNOSIS — Z87891 Personal history of nicotine dependence: Secondary | ICD-10-CM | POA: Insufficient documentation

## 2015-05-26 DIAGNOSIS — E785 Hyperlipidemia, unspecified: Secondary | ICD-10-CM | POA: Diagnosis not present

## 2015-05-26 DIAGNOSIS — K259 Gastric ulcer, unspecified as acute or chronic, without hemorrhage or perforation: Secondary | ICD-10-CM

## 2015-05-26 DIAGNOSIS — K449 Diaphragmatic hernia without obstruction or gangrene: Secondary | ICD-10-CM

## 2015-05-26 DIAGNOSIS — Z09 Encounter for follow-up examination after completed treatment for conditions other than malignant neoplasm: Secondary | ICD-10-CM | POA: Diagnosis not present

## 2015-05-26 HISTORY — PX: ESOPHAGOGASTRODUODENOSCOPY: SHX5428

## 2015-05-26 LAB — CBC
HCT: 40.4 % (ref 36.0–46.0)
HEMOGLOBIN: 12.7 g/dL (ref 12.0–15.0)
MCH: 27.3 pg (ref 26.0–34.0)
MCHC: 31.4 g/dL (ref 30.0–36.0)
MCV: 86.9 fL (ref 78.0–100.0)
PLATELETS: 243 10*3/uL (ref 150–400)
RBC: 4.65 MIL/uL (ref 3.87–5.11)
RDW: 17.2 % — ABNORMAL HIGH (ref 11.5–15.5)
WBC: 5.5 10*3/uL (ref 4.0–10.5)

## 2015-05-26 SURGERY — EGD (ESOPHAGOGASTRODUODENOSCOPY)
Anesthesia: Moderate Sedation

## 2015-05-26 MED ORDER — SODIUM CHLORIDE 0.9 % IV SOLN
INTRAVENOUS | Status: DC
  Administered 2015-05-26: 1000 mL via INTRAVENOUS

## 2015-05-26 MED ORDER — ONDANSETRON HCL 4 MG/2ML IJ SOLN
INTRAMUSCULAR | Status: AC
  Filled 2015-05-26: qty 2

## 2015-05-26 MED ORDER — MIDAZOLAM HCL 5 MG/5ML IJ SOLN
INTRAMUSCULAR | Status: AC
  Filled 2015-05-26: qty 10

## 2015-05-26 MED ORDER — MEPERIDINE HCL 100 MG/ML IJ SOLN
INTRAMUSCULAR | Status: DC | PRN
  Administered 2015-05-26: 25 mg via INTRAVENOUS
  Administered 2015-05-26: 50 mg via INTRAVENOUS

## 2015-05-26 MED ORDER — ONDANSETRON HCL 4 MG/2ML IJ SOLN
INTRAMUSCULAR | Status: DC | PRN
  Administered 2015-05-26: 4 mg via INTRAVENOUS

## 2015-05-26 MED ORDER — LIDOCAINE VISCOUS 2 % MT SOLN
OROMUCOSAL | Status: DC | PRN
  Administered 2015-05-26: 3 mL via OROMUCOSAL

## 2015-05-26 MED ORDER — MIDAZOLAM HCL 5 MG/5ML IJ SOLN
INTRAMUSCULAR | Status: DC | PRN
  Administered 2015-05-26 (×2): 2 mg via INTRAVENOUS

## 2015-05-26 MED ORDER — MEPERIDINE HCL 100 MG/ML IJ SOLN
INTRAMUSCULAR | Status: AC
  Filled 2015-05-26: qty 2

## 2015-05-26 MED ORDER — STERILE WATER FOR IRRIGATION IR SOLN
Status: DC | PRN
  Administered 2015-05-26: 11:00:00

## 2015-05-26 MED ORDER — LIDOCAINE VISCOUS 2 % MT SOLN
OROMUCOSAL | Status: AC
  Filled 2015-05-26: qty 15

## 2015-05-26 NOTE — H&P (View-Only) (Signed)
Primary Care Physician:  [name], FNP Primary Gastroenterologist:  Dr. Gala Romney  Pre-Procedure History & Physical: HPI:  Cheryl Perry is a 70 y.o. female here for follow-up secondary to upper GI bleed earlier in the year secondary to multiple NSAID-induced gastric ulcers. Has not had followup EGD as of yet. She scraped taking all nonsteroidals. Has had no melena or rectal bleeding  - hemoglobin came up into the 11 range. She's not had a follow-up CBC in several weeks. She continues on Protonix 40 mg twice daily. She reminded me Dr. Earlean Shawl did her colonoscopy a couple years ago which was negative. She's not due for another one for several years. H. Pylori serologies came back negative.  Past Medical History  Diagnosis Date  . Thyroid disease   . Hyperlipidemia   . Allergy   . Asthma   . Vasculopathy     lymphatic  . Back pain   . Hiatal hernia     Past Surgical History  Procedure Laterality Date  . Tubal ligation    . Knee surgery    . Cystscopy    . Cystoscopy    . Esophagogastroduodenoscopy N/A 01/28/2015    Dr.Niyla Marone- 3 small prepyloric/gastric ulcers- likely culprits causing bleeding. hiatal hernia, small gastic polyps not manipulated.    Prior to Admission medications   Medication Sig Start Date End Date Taking? Authorizing Provider  albuterol (PROVENTIL HFA) 108 (90 BASE) MCG/ACT inhaler Inhale 2 puffs into the lungs every 6 (six) hours as needed for wheezing. May use only 3 times a month   Yes Historical Provider, MD  Biotin 300 MCG TABS Take 300 mcg by mouth daily.    Yes Historical Provider, MD  Cholecalciferol (VITAMIN D) 2000 UNITS CAPS Take 1 capsule by mouth daily.   Yes Historical Provider, MD  fluticasone-salmeterol (ADVAIR HFA) 45-21 MCG/ACT inhaler Inhale 2 puffs into the lungs 2 (two) times daily. 12/14/14  Yes Sharion Balloon, FNP  hyoscyamine (LEVSIN, ANASPAZ) 0.125 MG tablet Take 0.125 mg by mouth every 4 (four) hours as needed for cramping.    Yes  Historical Provider, MD  levothyroxine (SYNTHROID, LEVOTHROID) 75 MCG tablet TAKE 1 TABLET (75 MCG TOTAL)  BY MOUTH DAILY BEFORE BREAKFAST. 12/14/14  Yes Sharion Balloon, FNP  loratadine (CLARITIN) 10 MG tablet Take 10 mg by mouth daily.   Yes Historical Provider, MD  methocarbamol (ROBAXIN) 500 MG tablet Take 1 tablet (500 mg total) by mouth 3 (three) times daily. Patient taking differently: Take 500 mg by mouth every 8 (eight) hours as needed for muscle spasms.  12/14/14  Yes Sharion Balloon, FNP  montelukast (SINGULAIR) 10 MG tablet Take 10 mg by mouth at bedtime.   Yes Historical Provider, MD  pantoprazole (PROTONIX) 40 MG tablet Take 1 tablet (40 mg total) by mouth 2 (two) times daily before a meal. 04/15/15  Yes Carlis Stable, NP  Probiotic Product (PROBIOTIC ADVANCED PO) Take 1 capsule by mouth daily.    Yes Historical Provider, MD  Niacin-Lovastatin (ADVICOR) 1000-40 MG TB24 Take 1 tablet by mouth at bedtime. Patient not taking: Reported on 05/13/2015 06/15/14   Sharion Balloon, FNP    Allergies as of 05/13/2015 - Review Complete 05/13/2015  Allergen Reaction Noted  . Naproxen Hives and Itching 09/02/2013  . Ampicillin  06/05/2012  . Avelox [moxifloxacin hcl in nacl]  06/05/2012  . Bactrim [sulfamethoxazole-trimethoprim]  06/05/2012  . Levaquin [levofloxacin in d5w]  06/05/2012  . Lipitor [atorvastatin]  06/05/2012  .  Macrodantin [nitrofurantoin macrocrystal]  06/05/2012  . Premarin [conjugated estrogens]  06/05/2012  . Trental [pentoxifylline er]  06/05/2012    Family History  Problem Relation Age of Onset  . Stroke Mother     Social History   Social History  . Marital Status: Married    Spouse Name: N/A  . Number of Children: N/A  . Years of Education: N/A   Occupational History  . Not on file.   Social History Main Topics  . Smoking status: Former Smoker    Quit date: 06/06/1974  . Smokeless tobacco: Not on file     Comment: Quit x 40 years ago  . Alcohol Use: No  .  Drug Use: No  . Sexual Activity: Not on file   Other Topics Concern  . Not on file   Social History Narrative    Review of Systems: See HPI, otherwise negative ROS  Physical Exam: BP 110/73 mmHg  Pulse 63  Temp(Src) 98.1 F (36.7 C) (Oral)  Ht 5' 6.5" (1.689 m)  Wt 169 lb (76.658 kg)  BMI 26.87 kg/m2 General:   Alert,  Well-developed, well-nourished, pleasant and cooperative in NAD Skin:  Intact without significant lesions or rashes. Neck:  Supple; no masses or thyromegaly. No significant cervical adenopathy. Lungs:  Clear throughout to auscultation.   No wheezes, crackles, or rhonchi. No acute distress. Heart:  Regular rate and rhythm; no murmurs, clicks, rubs,  or gallops. Abdomen: Non-distended, normal bowel sounds.  Soft and nontender without appreciable mass or hepatosplenomegaly.  Pulses:  Normal pulses noted. Extremities:  Without clubbing or edema.  Impression:  Pleasant 70 year old lady with a significant upper GI bleed secondary to multiple NSAID-induced gastric ulcers. Clinically, doing well with cessation of NSAIDs and twice a day PPI therapy. It is appropriate to schedule  A follow-up EGD to verify healing at this time.  Recommendations:  Schedule surveillance EGD - follow-up on Gastric ulcer.  The risks, benefits, limitations, alternatives and imponderables have been reviewed with the patient. Potential for esophageal dilation, biopsy, etc. have also been reviewed.  Questions have been answered. All parties agreeable.  CBC on day of procedure  Further recommendations to follow    Notice: This dictation was prepared with Dragon dictation along with smaller phrase technology. Any transcriptional errors that result from this process are unintentional and may not be corrected upon review.

## 2015-05-26 NOTE — Discharge Instructions (Signed)
EGD Discharge instructions Please read the instructions outlined below and refer to this sheet in the next few weeks. These discharge instructions provide you with general information on caring for yourself after you leave the hospital. Your doctor may also give you specific instructions. While your treatment has been planned according to the most current medical practices available, unavoidable complications occasionally occur. If you have any problems or questions after discharge, please call your doctor. ACTIVITY  You may resume your regular activity but move at a slower pace for the next 24 hours.   Take frequent rest periods for the next 24 hours.   Walking will help expel (get rid of) the air and reduce the bloated feeling in your abdomen.   No driving for 24 hours (because of the anesthesia (medicine) used during the test).   You may shower.   Do not sign any important legal documents or operate any machinery for 24 hours (because of the anesthesia used during the test).  NUTRITION  Drink plenty of fluids.   You may resume your normal diet.   Begin with a light meal and progress to your normal diet.   Avoid alcoholic beverages for 24 hours or as instructed by your caregiver.  MEDICATIONS  You may resume your normal medications unless your caregiver tells you otherwise.  WHAT YOU CAN EXPECT TODAY  You may experience abdominal discomfort such as a feeling of fullness or gas pains.  FOLLOW-UP  Your doctor will discuss the results of your test with you.  SEEK IMMEDIATE MEDICAL ATTENTION IF ANY OF THE FOLLOWING OCCUR:  Excessive nausea (feeling sick to your stomach) and/or vomiting.   Severe abdominal pain and distention (swelling).   Trouble swallowing.   Temperature over 101 F (37.8 C).   Rectal bleeding or vomiting of blood.    Gastric ulcer has healed completely  Avoid NSAIDS  Decrease Protonix to 40 mg once daily  Office visit in 1 year

## 2015-05-26 NOTE — Interval H&P Note (Signed)
History and Physical Interval Note:  05/26/2015 11:19 AM  Cheryl Perry  has presented today for surgery, with the diagnosis of gastric ulcer  The various methods of treatment have been discussed with the patient and family. After consideration of risks, benefits and other options for treatment, the patient has consented to  Procedure(s) with comments: ESOPHAGOGASTRODUODENOSCOPY (EGD) (N/A) - 6468 as a surgical intervention .  The patient's history has been reviewed, patient examined, no change in status, stable for surgery.  I have reviewed the patient's chart and labs.  Questions were answered to the patient's satisfaction.     Cheryl Perry  No change; egd per plan.  The risks, benefits, limitations, alternatives and imponderables have been reviewed with the patient. Potential for esophageal dilation, biopsy, etc. have also been reviewed.  Questions have been answered. All parties agreeable.

## 2015-05-26 NOTE — Op Note (Signed)
[facility] Patient Name: Cheryl Perry Procedure Date: 05/26/2015 11:12 AM MRN: [id number] Date of Birth: December 25, 1945 Attending MD: Norvel Richards , MD CSN: [id number] Age: 70 Admit Type: Outpatient Procedure:                Upper GI endoscopy; history of gastric ulcer disease Indications:              Surveillance procedure Providers:                Norvel Richards, MD, Gwenlyn Fudge, RN, Isabella Stalling, Technician Referring MD:              Medicines:                Midazolam 4 mg IV, Meperidine 75 mg IV, Ondansetron                            4 mg IV Complications:            No immediate complications. Estimated Blood Loss:     Estimated blood loss: none. Procedure:                Pre-Anesthesia Assessment:                           - Prior to the procedure, a History and Physical                            was performed, and patient medications and                            allergies were reviewed. The patient's tolerance of                            previous anesthesia was also reviewed. The risks                            and benefits of the procedure and the sedation                            options and risks were discussed with the patient.                            All questions were answered, and informed consent                            was obtained. Prior Anticoagulants: The patient has                            taken no previous anticoagulant or antiplatelet                            agents. ASA Grade Assessment: II - A patient with  mild systemic disease. After reviewing the risks                            and benefits, the patient was deemed in                            satisfactory condition to undergo the procedure.                           After obtaining informed consent, the endoscope was                            passed under direct vision. Throughout the   procedure, the patient's blood pressure, pulse, and                            oxygen saturations were monitored continuously. The                            EG-[id number] ([id number]) scope was introduced through the                            mouth, and advanced to the second part of duodenum.                            The upper GI endoscopy was accomplished without                            difficulty. The patient tolerated the procedure                            well. The upper GI endoscopy was accomplished                            without difficulty. The patient tolerated the                            procedure well. Scope In: 11:28:49 AM Scope Out: 11:31:57 AM Total Procedure Duration: 0 hours 3 minutes 8 seconds  Findings:      The examined esophagus was normal.      A small hiatal hernia was present. the gastric mucosa appeared normal.       Previously noted gastric ulcers completely healed.      The cardia and gastric fundus were normal on retroflexion.      The second portion of the duodenum was normal. Impression:               Normal gastric mucosa. Previously noted gastric                            ulcers completely healed.                           - Normal esophagus.                           -  Small hiatal hernia.                           - Normal second portion of the duodenum.                           - No specimens collected. Moderate Sedation:      Moderate (conscious) sedation was administered by the endoscopy nurse       and supervised by the endoscopist. The following parameters were       monitored: oxygen saturation, heart rate, blood pressure, respiratory       rate, EKG, adequacy of pulmonary ventilation, and response to care.       Total physician intraservice time was 9 minutes. Recommendation:           - Patient has a contact number available for                            emergencies. The signs and symptoms of potential                             delayed complications were discussed with the                            patient. Return to normal activities tomorrow.                            Written discharge instructions were provided to the                            patient.                           - Advance diet as tolerated.                           - Decrease Protonix to 40 mg once daily. Avoid                            nonsterodal agents.                           - No repeat upper endoscopy.                           - Return to GI office in 1 year. Procedure Code(s):        --- Professional ---                           [phone], Esophagogastroduodenoscopy, flexible,                            transoral; diagnostic, including collection of                            specimen(s) by brushing or washing, when performed                            (  separate procedure) Diagnosis Code(s):        --- Professional ---                           K44.9, Diaphragmatic hernia without obstruction or                            gangrene CPT copyright 2016 [employer]. All rights reserved. The codes documented in this report are preliminary and upon coder review may  be revised to meet current compliance requirements. Cristopher Estimable. Kelley Polinsky, MD Norvel Richards, MD 05/26/2015 11:43:41 AM This report has been signed electronically. Number of Addenda: 0

## 2015-05-27 DIAGNOSIS — M542 Cervicalgia: Secondary | ICD-10-CM | POA: Diagnosis not present

## 2015-05-27 DIAGNOSIS — J301 Allergic rhinitis due to pollen: Secondary | ICD-10-CM | POA: Diagnosis not present

## 2015-05-27 DIAGNOSIS — J3089 Other allergic rhinitis: Secondary | ICD-10-CM | POA: Diagnosis not present

## 2015-05-27 DIAGNOSIS — J3081 Allergic rhinitis due to animal (cat) (dog) hair and dander: Secondary | ICD-10-CM | POA: Diagnosis not present

## 2015-05-27 DIAGNOSIS — M545 Low back pain: Secondary | ICD-10-CM | POA: Diagnosis not present

## 2015-05-30 ENCOUNTER — Encounter (HOSPITAL_COMMUNITY): Payer: Self-pay | Admitting: Internal Medicine

## 2015-06-02 DIAGNOSIS — J3081 Allergic rhinitis due to animal (cat) (dog) hair and dander: Secondary | ICD-10-CM | POA: Diagnosis not present

## 2015-06-02 DIAGNOSIS — J3089 Other allergic rhinitis: Secondary | ICD-10-CM | POA: Diagnosis not present

## 2015-06-02 DIAGNOSIS — M47816 Spondylosis without myelopathy or radiculopathy, lumbar region: Secondary | ICD-10-CM | POA: Diagnosis not present

## 2015-06-02 DIAGNOSIS — M5136 Other intervertebral disc degeneration, lumbar region: Secondary | ICD-10-CM | POA: Diagnosis not present

## 2015-06-02 DIAGNOSIS — M50322 Other cervical disc degeneration at C5-C6 level: Secondary | ICD-10-CM | POA: Diagnosis not present

## 2015-06-02 DIAGNOSIS — J301 Allergic rhinitis due to pollen: Secondary | ICD-10-CM | POA: Diagnosis not present

## 2015-06-02 DIAGNOSIS — M47812 Spondylosis without myelopathy or radiculopathy, cervical region: Secondary | ICD-10-CM | POA: Diagnosis not present

## 2015-06-08 DIAGNOSIS — J3089 Other allergic rhinitis: Secondary | ICD-10-CM | POA: Diagnosis not present

## 2015-06-08 DIAGNOSIS — J301 Allergic rhinitis due to pollen: Secondary | ICD-10-CM | POA: Diagnosis not present

## 2015-06-08 DIAGNOSIS — J3081 Allergic rhinitis due to animal (cat) (dog) hair and dander: Secondary | ICD-10-CM | POA: Diagnosis not present

## 2015-06-13 DIAGNOSIS — J301 Allergic rhinitis due to pollen: Secondary | ICD-10-CM | POA: Diagnosis not present

## 2015-06-13 DIAGNOSIS — J3081 Allergic rhinitis due to animal (cat) (dog) hair and dander: Secondary | ICD-10-CM | POA: Diagnosis not present

## 2015-06-13 DIAGNOSIS — J3089 Other allergic rhinitis: Secondary | ICD-10-CM | POA: Diagnosis not present

## 2015-06-14 ENCOUNTER — Encounter: Payer: Self-pay | Admitting: Family

## 2015-06-14 ENCOUNTER — Ambulatory Visit (INDEPENDENT_AMBULATORY_CARE_PROVIDER_SITE_OTHER): Payer: Medicare Other | Admitting: Family

## 2015-06-14 VITALS — BP 126/90 | HR 63 | Temp 97.1°F | Ht 66.5 in | Wt 166.8 lb

## 2015-06-14 DIAGNOSIS — E663 Overweight: Secondary | ICD-10-CM | POA: Diagnosis not present

## 2015-06-14 DIAGNOSIS — J4520 Mild intermittent asthma, uncomplicated: Secondary | ICD-10-CM

## 2015-06-14 DIAGNOSIS — M8580 Other specified disorders of bone density and structure, unspecified site: Secondary | ICD-10-CM | POA: Diagnosis not present

## 2015-06-14 DIAGNOSIS — E559 Vitamin D deficiency, unspecified: Secondary | ICD-10-CM | POA: Diagnosis not present

## 2015-06-14 DIAGNOSIS — E785 Hyperlipidemia, unspecified: Secondary | ICD-10-CM

## 2015-06-14 DIAGNOSIS — E038 Other specified hypothyroidism: Secondary | ICD-10-CM

## 2015-06-14 DIAGNOSIS — K219 Gastro-esophageal reflux disease without esophagitis: Secondary | ICD-10-CM

## 2015-06-14 NOTE — Patient Instructions (Signed)
Health Maintenance, Female Adopting a healthy lifestyle and getting preventive care can go a long way to promote health and wellness. Talk with your health care provider about what schedule of regular examinations is right for you. This is a good chance for you to check in with your provider about disease prevention and staying healthy. In between checkups, there are plenty of things you can do on your own. Experts have done a lot of research about which lifestyle changes and preventive measures are most likely to keep you healthy. Ask your health care provider for more information. WEIGHT AND DIET  Eat a healthy diet  Be sure to include plenty of vegetables, fruits, low-fat dairy products, and lean protein.  Do not eat a lot of foods high in solid fats, added sugars, or salt.  Get regular exercise. This is one of the most important things you can do for your health.  Most adults should exercise for at least 150 minutes each week. The exercise should increase your heart rate and make you sweat (moderate-intensity exercise).  Most adults should also do strengthening exercises at least twice a week. This is in addition to the moderate-intensity exercise.  Maintain a healthy weight  Body mass index (BMI) is a measurement that can be used to identify possible weight problems. It estimates body fat based on height and weight. Your health care provider can help determine your BMI and help you achieve or maintain a healthy weight.  For females 20 years of age and older:   A BMI below 18.5 is considered underweight.  A BMI of 18.5 to 24.9 is normal.  A BMI of 25 to 29.9 is considered overweight.  A BMI of 30 and above is considered obese.  Watch levels of cholesterol and blood lipids  You should start having your blood tested for lipids and cholesterol at 70 years of age, then have this test every 5 years.  You may need to have your cholesterol levels checked more often if:  Your lipid  or cholesterol levels are high.  You are older than 70 years of age.  You are at high risk for heart disease.  CANCER SCREENING   Lung Cancer  Lung cancer screening is recommended for adults 55-80 years old who are at high risk for lung cancer because of a history of smoking.  A yearly low-dose CT scan of the lungs is recommended for people who:  Currently smoke.  Have quit within the past 15 years.  Have at least a 30-pack-year history of smoking. A pack year is smoking an average of one pack of cigarettes a day for 1 year.  Yearly screening should continue until it has been 15 years since you quit.  Yearly screening should stop if you develop a health problem that would prevent you from having lung cancer treatment.  Breast Cancer  Practice breast self-awareness. This means understanding how your breasts normally appear and feel.  It also means doing regular breast self-exams. Let your health care provider know about any changes, no matter how small.  If you are in your 20s or 30s, you should have a clinical breast exam (CBE) by a health care provider every 1-3 years as part of a regular health exam.  If you are 40 or older, have a CBE every year. Also consider having a breast X-ray (mammogram) every year.  If you have a family history of breast cancer, talk to your health care provider about genetic screening.  If you   are at high risk for breast cancer, talk to your health care provider about having an MRI and a mammogram every year.  Breast cancer gene (BRCA) assessment is recommended for women who have family members with BRCA-related cancers. BRCA-related cancers include:  Breast.  Ovarian.  Tubal.  Peritoneal cancers.  Results of the assessment will determine the need for genetic counseling and BRCA1 and BRCA2 testing. Cervical Cancer Your health care provider may recommend that you be screened regularly for cancer of the pelvic organs (ovaries, uterus, and  vagina). This screening involves a pelvic examination, including checking for microscopic changes to the surface of your cervix (Pap test). You may be encouraged to have this screening done every 3 years, beginning at age 21.  For women ages 30-65, health care providers may recommend pelvic exams and Pap testing every 3 years, or they may recommend the Pap and pelvic exam, combined with testing for human papilloma virus (HPV), every 5 years. Some types of HPV increase your risk of cervical cancer. Testing for HPV may also be done on women of any age with unclear Pap test results.  Other health care providers may not recommend any screening for nonpregnant women who are considered low risk for pelvic cancer and who do not have symptoms. Ask your health care provider if a screening pelvic exam is right for you.  If you have had past treatment for cervical cancer or a condition that could lead to cancer, you need Pap tests and screening for cancer for at least 20 years after your treatment. If Pap tests have been discontinued, your risk factors (such as having a new sexual partner) need to be reassessed to determine if screening should resume. Some women have medical problems that increase the chance of getting cervical cancer. In these cases, your health care provider may recommend more frequent screening and Pap tests. Colorectal Cancer  This type of cancer can be detected and often prevented.  Routine colorectal cancer screening usually begins at 70 years of age and continues through 70 years of age.  Your health care provider may recommend screening at an earlier age if you have risk factors for colon cancer.  Your health care provider may also recommend using home test kits to check for hidden blood in the stool.  A small camera at the end of a tube can be used to examine your colon directly (sigmoidoscopy or colonoscopy). This is done to check for the earliest forms of colorectal  cancer.  Routine screening usually begins at age 50.  Direct examination of the colon should be repeated every 5-10 years through 70 years of age. However, you may need to be screened more often if early forms of precancerous polyps or small growths are found. Skin Cancer  Check your skin from head to toe regularly.  Tell your health care provider about any new moles or changes in moles, especially if there is a change in a mole's shape or color.  Also tell your health care provider if you have a mole that is larger than the size of a pencil eraser.  Always use sunscreen. Apply sunscreen liberally and repeatedly throughout the day.  Protect yourself by wearing long sleeves, pants, a wide-brimmed hat, and sunglasses whenever you are outside. HEART DISEASE, DIABETES, AND HIGH BLOOD PRESSURE   High blood pressure causes heart disease and increases the risk of stroke. High blood pressure is more likely to develop in:  People who have blood pressure in the high end   of the normal range (130-139/85-89 mm Hg).  People who are overweight or obese.  People who are African American.  If you are 38-23 years of age, have your blood pressure checked every 3-5 years. If you are 61 years of age or older, have your blood pressure checked every year. You should have your blood pressure measured twice--once when you are at a hospital or clinic, and once when you are not at a hospital or clinic. Record the average of the two measurements. To check your blood pressure when you are not at a hospital or clinic, you can use:  An automated blood pressure machine at a pharmacy.  A home blood pressure monitor.  If you are between 45 years and 39 years old, ask your health care provider if you should take aspirin to prevent strokes.  Have regular diabetes screenings. This involves taking a blood sample to check your fasting blood sugar level.  If you are at a normal weight and have a low risk for diabetes,  have this test once every three years after 70 years of age.  If you are overweight and have a high risk for diabetes, consider being tested at a younger age or more often. PREVENTING INFECTION  Hepatitis B  If you have a higher risk for hepatitis B, you should be screened for this virus. You are considered at high risk for hepatitis B if:  You were born in a country where hepatitis B is common. Ask your health care provider which countries are considered high risk.  Your parents were born in a high-risk country, and you have not been immunized against hepatitis B (hepatitis B vaccine).  You have HIV or AIDS.  You use needles to inject street drugs.  You live with someone who has hepatitis B.  You have had sex with someone who has hepatitis B.  You get hemodialysis treatment.  You take certain medicines for conditions, including cancer, organ transplantation, and autoimmune conditions. Hepatitis C  Blood testing is recommended for:  Everyone born from [birth year] through 1965.  Anyone with known risk factors for hepatitis C. Sexually transmitted infections (STIs)  You should be screened for sexually transmitted infections (STIs) including gonorrhea and chlamydia if:  You are sexually active and are younger than 70 years of age.  You are older than 70 years of age and your health care provider tells you that you are at risk for this type of infection.  Your sexual activity has changed since you were last screened and you are at an increased risk for chlamydia or gonorrhea. Ask your health care provider if you are at risk.  If you do not have HIV, but are at risk, it may be recommended that you take a prescription medicine daily to prevent HIV infection. This is called pre-exposure prophylaxis (PrEP). You are considered at risk if:  You are sexually active and do not regularly use condoms or know the HIV status of your partner(s).  You take drugs by injection.  You are sexually  active with a partner who has HIV. Talk with your health care provider about whether you are at high risk of being infected with HIV. If you choose to begin PrEP, you should first be tested for HIV. You should then be tested every 3 months for as long as you are taking PrEP.  PREGNANCY   If you are premenopausal and you may become pregnant, ask your health care provider about preconception counseling.  If you may  become pregnant, take 400 to 800 micrograms (mcg) of folic acid every day.  If you want to prevent pregnancy, talk to your health care provider about birth control (contraception). OSTEOPOROSIS AND MENOPAUSE   Osteoporosis is a disease in which the bones lose minerals and strength with aging. This can result in serious bone fractures. Your risk for osteoporosis can be identified using a bone density scan.  If you are 61 years of age or older, or if you are at risk for osteoporosis and fractures, ask your health care provider if you should be screened.  Ask your health care provider whether you should take a calcium or vitamin D supplement to lower your risk for osteoporosis.  Menopause may have certain physical symptoms and risks.  Hormone replacement therapy may reduce some of these symptoms and risks. Talk to your health care provider about whether hormone replacement therapy is right for you.  HOME CARE INSTRUCTIONS   Schedule regular health, dental, and eye exams.  Stay current with your immunizations.   Do not use any tobacco products including cigarettes, chewing tobacco, or electronic cigarettes.  If you are pregnant, do not drink alcohol.  If you are breastfeeding, limit how much and how often you drink alcohol.  Limit alcohol intake to no more than 1 drink per day for nonpregnant women. One drink equals 12 ounces of beer, 5 ounces of wine, or 1 ounces of hard liquor.  Do not use street drugs.  Do not share needles.  Ask your health care provider for help if  you need support or information about quitting drugs.  Tell your health care provider if you often feel depressed.  Tell your health care provider if you have ever been abused or do not feel safe at home.   This information is not intended to replace advice given to you by your health care provider. Make sure you discuss any questions you have with your health care provider.   Document Released: 07/10/2010 Document Revised: 01/15/2014 Document Reviewed: 11/26/2012 Elsevier Interactive Patient Education [employer].

## 2015-06-14 NOTE — Progress Notes (Signed)
Subjective:    Patient ID: Cheryl Perry, female    DOB: April 08, 1945, 70 y.o.   MRN: [id number]  Pt presents to the office today for chronic follow up. Pt had a acute GI bleed in January, and is been followed by GI for that.  Gastroesophageal Reflux She complains of heartburn. She reports no belching, no coughing, no hoarse voice, no sore throat or no wheezing. This is a chronic problem. The current episode started more than 1 year ago. The problem occurs occasionally. The problem has been resolved. The heartburn is located in the substernum. The symptoms are aggravated by certain foods. She has tried a PPI for the symptoms. The treatment provided moderate relief.  Hyperlipidemia This is a chronic problem. The current episode started more than 1 year ago. The problem is uncontrolled. Recent lipid tests were reviewed and are high. Exacerbating diseases include hypothyroidism. She has no history of diabetes. Pertinent negatives include no leg pain, myalgias or shortness of breath. Current antihyperlipidemic treatment includes diet change. The current treatment provides mild improvement of lipids. Risk factors for coronary artery disease include dyslipidemia, obesity, post-menopausal, a sedentary lifestyle, family history and stress.  Thyroid Problem Presents for follow-up visit. Symptoms include dry skin and hair loss. Patient reports no anxiety, constipation, diarrhea, heat intolerance, hoarse voice, leg swelling, nail problem, palpitations or visual change. The symptoms have been stable. Past treatments include levothyroxine. The treatment provided significant relief. Her past medical history is significant for hyperlipidemia. There is no history of diabetes.  Asthma There is no cough, hoarse voice, shortness of breath or wheezing. This is a chronic problem. The current episode started more than 1 year ago. The problem occurs intermittently. The problem has been rapidly improving. Associated symptoms  include heartburn. Pertinent negatives include no headaches, myalgias or sore throat. Her symptoms are aggravated by pollen. Her symptoms are alleviated by leukotriene antagonist, rest and beta-agonist. She reports significant improvement on treatment. Her symptoms are not alleviated by rest, ipratropium and leukotriene antagonist. Her past medical history is significant for asthma.      Review of Systems  Constitutional: Negative.   HENT: Negative.  Negative for hoarse voice and sore throat.   Eyes: Negative.   Respiratory: Negative.  Negative for cough, shortness of breath and wheezing.   Cardiovascular: Negative.  Negative for palpitations.  Gastrointestinal: Positive for heartburn. Negative for diarrhea and constipation.  Endocrine: Negative.  Negative for heat intolerance.  Genitourinary: Negative.   Musculoskeletal: Negative.  Negative for myalgias.  Neurological: Negative.  Negative for headaches.  Hematological: Negative.   Psychiatric/Behavioral: Negative.  The patient is not nervous/anxious.   All other systems reviewed and are negative.      Objective:   Physical Exam  Constitutional: She is oriented to person, place, and time. She appears well-developed and well-nourished. No distress.  HENT:  Head: Normocephalic and atraumatic.  Right Ear: External ear normal.  Left Ear: External ear normal.  Nose: Nose normal.  Mouth/Throat: Oropharynx is clear and moist.  Eyes: Pupils are equal, round, and reactive to light.  Neck: Normal range of motion. Neck supple. No thyromegaly present.  Cardiovascular: Normal rate, regular rhythm, normal heart sounds and intact distal pulses.   No murmur heard. Pulmonary/Chest: Effort normal and breath sounds normal. No respiratory distress. She has no wheezes.  Abdominal: Soft. Bowel sounds are normal. She exhibits no distension. There is no tenderness.  Musculoskeletal: Normal range of motion. She exhibits no edema or tenderness.    Neurological:  She is alert and oriented to person, place, and time.  Skin: Skin is warm and dry.  Psychiatric: She has a normal mood and affect. Her behavior is normal. Judgment and thought content normal.  Vitals reviewed.   BP 126/90 mmHg  Pulse 63  Temp(Src) 97.1 F (36.2 C) (Oral)  Ht 5' 6.5" (1.689 m)  Wt 166 lb 12.8 oz (75.66 kg)  BMI 26.52 kg/m2      Assessment & Plan:  1. Asthma, mild intermittent, uncomplicated - ZXY72+WVTV  2. Gastroesophageal reflux disease, esophagitis presence not specified - CMP14+EGFR  3. Other specified hypothyroidism - CMP14+EGFR - Thyroid Panel With TSH  4. Osteopenia - CMP14+EGFR  5. HLD (hyperlipidemia) - CMP14+EGFR - Lipid panel  6. Vitamin D deficiency - CMP14+EGFR - VITAMIN D 25 Hydroxy (Vit-D Deficiency, Fractures)  7. Overweight (BMI 25.0-29.9) - CMP14+EGFR   Continue all meds, Keep all appts with GI Labs pending Health Maintenance reviewed- Up to date Diet and exercise encouraged RTO 6 months  [name], FNP

## 2015-06-15 ENCOUNTER — Other Ambulatory Visit: Payer: Self-pay | Admitting: Family

## 2015-06-15 DIAGNOSIS — J3089 Other allergic rhinitis: Secondary | ICD-10-CM | POA: Diagnosis not present

## 2015-06-15 DIAGNOSIS — J301 Allergic rhinitis due to pollen: Secondary | ICD-10-CM | POA: Diagnosis not present

## 2015-06-15 DIAGNOSIS — J3081 Allergic rhinitis due to animal (cat) (dog) hair and dander: Secondary | ICD-10-CM | POA: Diagnosis not present

## 2015-06-15 LAB — LIPID PANEL
CHOL/HDL RATIO: 3.5 ratio (ref 0.0–4.4)
Cholesterol, Total: 250 mg/dL — ABNORMAL HIGH (ref 100–199)
HDL: 72 mg/dL (ref >39)
LDL Calculated: 160 mg/dL — ABNORMAL HIGH (ref 0–99)
TRIGLYCERIDES: 89 mg/dL (ref 0–149)
VLDL Cholesterol Cal: 18 mg/dL (ref 5–40)

## 2015-06-15 LAB — CMP14+EGFR
A/G RATIO: 1.8 (ref 1.2–2.2)
ALT: 13 IU/L (ref 0–32)
AST: 22 IU/L (ref 0–40)
Albumin: 4.1 g/dL (ref 3.6–4.8)
Alkaline Phosphatase: 88 IU/L (ref 39–117)
BUN/Creatinine Ratio: 16 (ref 12–28)
BUN: 13 mg/dL (ref 8–27)
Bilirubin Total: 0.3 mg/dL (ref 0.0–1.2)
CALCIUM: 9.6 mg/dL (ref 8.7–10.3)
CHLORIDE: 103 mmol/L (ref 96–106)
CO2: 25 mmol/L (ref 18–29)
Creatinine, Ser: 0.81 mg/dL (ref 0.57–1.00)
GFR calc Af Amer: 86 mL/min/{1.73_m2} (ref >59)
GFR, EST NON AFRICAN AMERICAN: 74 mL/min/{1.73_m2} (ref >59)
GLUCOSE: 87 mg/dL (ref 65–99)
Globulin, Total: 2.3 g/dL (ref 1.5–4.5)
POTASSIUM: 4.6 mmol/L (ref 3.5–5.2)
Sodium: 142 mmol/L (ref 134–144)
Total Protein: 6.4 g/dL (ref 6.0–8.5)

## 2015-06-15 LAB — THYROID PANEL WITH TSH
FREE THYROXINE INDEX: 1.8 (ref 1.2–4.9)
T3 Uptake Ratio: 24 % (ref 24–39)
T4, Total: 7.5 ug/dL (ref 4.5–12.0)
TSH: 3.15 u[IU]/mL (ref 0.450–4.500)

## 2015-06-15 LAB — VITAMIN D 25 HYDROXY (VIT D DEFICIENCY, FRACTURES): VIT D 25 HYDROXY: 39.4 ng/mL (ref 30.0–100.0)

## 2015-06-15 MED ORDER — PITAVASTATIN CALCIUM 2 MG PO TABS: 2.0000 mg | ORAL_TABLET | Freq: Every day | ORAL | Status: DC

## 2015-06-16 ENCOUNTER — Telehealth: Payer: Self-pay | Admitting: Family

## 2015-06-16 NOTE — Telephone Encounter (Signed)
Aware of results. 

## 2015-06-23 DIAGNOSIS — J3089 Other allergic rhinitis: Secondary | ICD-10-CM | POA: Diagnosis not present

## 2015-06-23 DIAGNOSIS — J301 Allergic rhinitis due to pollen: Secondary | ICD-10-CM | POA: Diagnosis not present

## 2015-06-23 DIAGNOSIS — J3081 Allergic rhinitis due to animal (cat) (dog) hair and dander: Secondary | ICD-10-CM | POA: Diagnosis not present

## 2015-06-27 DIAGNOSIS — J301 Allergic rhinitis due to pollen: Secondary | ICD-10-CM | POA: Diagnosis not present

## 2015-06-27 DIAGNOSIS — J3081 Allergic rhinitis due to animal (cat) (dog) hair and dander: Secondary | ICD-10-CM | POA: Diagnosis not present

## 2015-06-27 DIAGNOSIS — Z1231 Encounter for screening mammogram for malignant neoplasm of breast: Secondary | ICD-10-CM | POA: Diagnosis not present

## 2015-06-27 DIAGNOSIS — J3089 Other allergic rhinitis: Secondary | ICD-10-CM | POA: Diagnosis not present

## 2015-06-29 DIAGNOSIS — J3089 Other allergic rhinitis: Secondary | ICD-10-CM | POA: Diagnosis not present

## 2015-06-29 DIAGNOSIS — J3081 Allergic rhinitis due to animal (cat) (dog) hair and dander: Secondary | ICD-10-CM | POA: Diagnosis not present

## 2015-06-29 DIAGNOSIS — J301 Allergic rhinitis due to pollen: Secondary | ICD-10-CM | POA: Diagnosis not present

## 2015-07-04 ENCOUNTER — Telehealth: Payer: Self-pay | Admitting: Family

## 2015-07-04 ENCOUNTER — Telehealth: Payer: Self-pay | Admitting: *Deleted

## 2015-07-04 NOTE — Telephone Encounter (Signed)
Stop Livalo and pt needs to be on low fat diet and exercise

## 2015-07-04 NOTE — Telephone Encounter (Signed)
Pt called to report that Livalo is causing severe myalgias and nausea Please advise

## 2015-07-04 NOTE — Telephone Encounter (Signed)
Spoke with pt regarding Christy's recommendation Verbalizes understanding

## 2015-07-05 NOTE — Telephone Encounter (Signed)
Patient aware.

## 2015-07-05 NOTE — Addendum Note (Signed)
Addended by: Thana Ates on: 07/05/2015 10:58 AM   Modules accepted: Orders

## 2015-07-06 DIAGNOSIS — J3089 Other allergic rhinitis: Secondary | ICD-10-CM | POA: Diagnosis not present

## 2015-07-06 DIAGNOSIS — J301 Allergic rhinitis due to pollen: Secondary | ICD-10-CM | POA: Diagnosis not present

## 2015-07-06 DIAGNOSIS — J3081 Allergic rhinitis due to animal (cat) (dog) hair and dander: Secondary | ICD-10-CM | POA: Diagnosis not present

## 2015-07-15 DIAGNOSIS — J3081 Allergic rhinitis due to animal (cat) (dog) hair and dander: Secondary | ICD-10-CM | POA: Diagnosis not present

## 2015-07-15 DIAGNOSIS — J3089 Other allergic rhinitis: Secondary | ICD-10-CM | POA: Diagnosis not present

## 2015-07-15 DIAGNOSIS — J301 Allergic rhinitis due to pollen: Secondary | ICD-10-CM | POA: Diagnosis not present

## 2015-07-25 DIAGNOSIS — J301 Allergic rhinitis due to pollen: Secondary | ICD-10-CM | POA: Diagnosis not present

## 2015-07-25 DIAGNOSIS — J3089 Other allergic rhinitis: Secondary | ICD-10-CM | POA: Diagnosis not present

## 2015-07-25 DIAGNOSIS — J3081 Allergic rhinitis due to animal (cat) (dog) hair and dander: Secondary | ICD-10-CM | POA: Diagnosis not present

## 2015-08-01 DIAGNOSIS — J301 Allergic rhinitis due to pollen: Secondary | ICD-10-CM | POA: Diagnosis not present

## 2015-08-01 DIAGNOSIS — J3089 Other allergic rhinitis: Secondary | ICD-10-CM | POA: Diagnosis not present

## 2015-08-01 DIAGNOSIS — J3081 Allergic rhinitis due to animal (cat) (dog) hair and dander: Secondary | ICD-10-CM | POA: Diagnosis not present

## 2015-08-08 DIAGNOSIS — J3089 Other allergic rhinitis: Secondary | ICD-10-CM | POA: Diagnosis not present

## 2015-08-08 DIAGNOSIS — J3081 Allergic rhinitis due to animal (cat) (dog) hair and dander: Secondary | ICD-10-CM | POA: Diagnosis not present

## 2015-08-08 DIAGNOSIS — J301 Allergic rhinitis due to pollen: Secondary | ICD-10-CM | POA: Diagnosis not present

## 2015-08-18 DIAGNOSIS — J301 Allergic rhinitis due to pollen: Secondary | ICD-10-CM | POA: Diagnosis not present

## 2015-08-18 DIAGNOSIS — J3081 Allergic rhinitis due to animal (cat) (dog) hair and dander: Secondary | ICD-10-CM | POA: Diagnosis not present

## 2015-08-18 DIAGNOSIS — J3089 Other allergic rhinitis: Secondary | ICD-10-CM | POA: Diagnosis not present

## 2015-08-25 DIAGNOSIS — J3089 Other allergic rhinitis: Secondary | ICD-10-CM | POA: Diagnosis not present

## 2015-08-25 DIAGNOSIS — J301 Allergic rhinitis due to pollen: Secondary | ICD-10-CM | POA: Diagnosis not present

## 2015-08-25 DIAGNOSIS — J3081 Allergic rhinitis due to animal (cat) (dog) hair and dander: Secondary | ICD-10-CM | POA: Diagnosis not present

## 2015-09-02 DIAGNOSIS — J3089 Other allergic rhinitis: Secondary | ICD-10-CM | POA: Diagnosis not present

## 2015-09-02 DIAGNOSIS — J3081 Allergic rhinitis due to animal (cat) (dog) hair and dander: Secondary | ICD-10-CM | POA: Diagnosis not present

## 2015-09-02 DIAGNOSIS — J301 Allergic rhinitis due to pollen: Secondary | ICD-10-CM | POA: Diagnosis not present

## 2015-09-06 DIAGNOSIS — M1812 Unilateral primary osteoarthritis of first carpometacarpal joint, left hand: Secondary | ICD-10-CM | POA: Diagnosis not present

## 2015-09-06 DIAGNOSIS — J3081 Allergic rhinitis due to animal (cat) (dog) hair and dander: Secondary | ICD-10-CM | POA: Diagnosis not present

## 2015-09-06 DIAGNOSIS — J301 Allergic rhinitis due to pollen: Secondary | ICD-10-CM | POA: Diagnosis not present

## 2015-09-06 DIAGNOSIS — G5602 Carpal tunnel syndrome, left upper limb: Secondary | ICD-10-CM | POA: Diagnosis not present

## 2015-09-06 DIAGNOSIS — J3089 Other allergic rhinitis: Secondary | ICD-10-CM | POA: Diagnosis not present

## 2015-09-16 DIAGNOSIS — J3089 Other allergic rhinitis: Secondary | ICD-10-CM | POA: Diagnosis not present

## 2015-09-16 DIAGNOSIS — J3081 Allergic rhinitis due to animal (cat) (dog) hair and dander: Secondary | ICD-10-CM | POA: Diagnosis not present

## 2015-09-16 DIAGNOSIS — J301 Allergic rhinitis due to pollen: Secondary | ICD-10-CM | POA: Diagnosis not present

## 2015-09-22 DIAGNOSIS — J3081 Allergic rhinitis due to animal (cat) (dog) hair and dander: Secondary | ICD-10-CM | POA: Diagnosis not present

## 2015-09-22 DIAGNOSIS — J301 Allergic rhinitis due to pollen: Secondary | ICD-10-CM | POA: Diagnosis not present

## 2015-09-22 DIAGNOSIS — J3089 Other allergic rhinitis: Secondary | ICD-10-CM | POA: Diagnosis not present

## 2015-09-26 DIAGNOSIS — G5602 Carpal tunnel syndrome, left upper limb: Secondary | ICD-10-CM | POA: Diagnosis not present

## 2015-09-30 DIAGNOSIS — J3089 Other allergic rhinitis: Secondary | ICD-10-CM | POA: Diagnosis not present

## 2015-09-30 DIAGNOSIS — J301 Allergic rhinitis due to pollen: Secondary | ICD-10-CM | POA: Diagnosis not present

## 2015-09-30 DIAGNOSIS — G5602 Carpal tunnel syndrome, left upper limb: Secondary | ICD-10-CM | POA: Diagnosis not present

## 2015-09-30 DIAGNOSIS — J3081 Allergic rhinitis due to animal (cat) (dog) hair and dander: Secondary | ICD-10-CM | POA: Diagnosis not present

## 2015-09-30 DIAGNOSIS — M1812 Unilateral primary osteoarthritis of first carpometacarpal joint, left hand: Secondary | ICD-10-CM | POA: Diagnosis not present

## 2015-09-30 DIAGNOSIS — M654 Radial styloid tenosynovitis [de Quervain]: Secondary | ICD-10-CM | POA: Diagnosis not present

## 2015-10-07 DIAGNOSIS — J301 Allergic rhinitis due to pollen: Secondary | ICD-10-CM | POA: Diagnosis not present

## 2015-10-07 DIAGNOSIS — J3081 Allergic rhinitis due to animal (cat) (dog) hair and dander: Secondary | ICD-10-CM | POA: Diagnosis not present

## 2015-10-07 DIAGNOSIS — J3089 Other allergic rhinitis: Secondary | ICD-10-CM | POA: Diagnosis not present

## 2015-10-11 ENCOUNTER — Ambulatory Visit (INDEPENDENT_AMBULATORY_CARE_PROVIDER_SITE_OTHER): Payer: Medicare Other

## 2015-10-11 DIAGNOSIS — Z23 Encounter for immunization: Secondary | ICD-10-CM | POA: Diagnosis not present

## 2015-10-17 DIAGNOSIS — J3081 Allergic rhinitis due to animal (cat) (dog) hair and dander: Secondary | ICD-10-CM | POA: Diagnosis not present

## 2015-10-17 DIAGNOSIS — J301 Allergic rhinitis due to pollen: Secondary | ICD-10-CM | POA: Diagnosis not present

## 2015-10-17 DIAGNOSIS — J3089 Other allergic rhinitis: Secondary | ICD-10-CM | POA: Diagnosis not present

## 2015-11-01 DIAGNOSIS — J3089 Other allergic rhinitis: Secondary | ICD-10-CM | POA: Diagnosis not present

## 2015-11-01 DIAGNOSIS — M1812 Unilateral primary osteoarthritis of first carpometacarpal joint, left hand: Secondary | ICD-10-CM | POA: Diagnosis not present

## 2015-11-01 DIAGNOSIS — J301 Allergic rhinitis due to pollen: Secondary | ICD-10-CM | POA: Diagnosis not present

## 2015-11-01 DIAGNOSIS — J3081 Allergic rhinitis due to animal (cat) (dog) hair and dander: Secondary | ICD-10-CM | POA: Diagnosis not present

## 2015-11-01 DIAGNOSIS — M654 Radial styloid tenosynovitis [de Quervain]: Secondary | ICD-10-CM | POA: Diagnosis not present

## 2015-11-01 DIAGNOSIS — G5602 Carpal tunnel syndrome, left upper limb: Secondary | ICD-10-CM | POA: Diagnosis not present

## 2015-11-08 DIAGNOSIS — J301 Allergic rhinitis due to pollen: Secondary | ICD-10-CM | POA: Diagnosis not present

## 2015-11-08 DIAGNOSIS — J3089 Other allergic rhinitis: Secondary | ICD-10-CM | POA: Diagnosis not present

## 2015-11-08 DIAGNOSIS — J3081 Allergic rhinitis due to animal (cat) (dog) hair and dander: Secondary | ICD-10-CM | POA: Diagnosis not present

## 2015-11-22 DIAGNOSIS — J301 Allergic rhinitis due to pollen: Secondary | ICD-10-CM | POA: Diagnosis not present

## 2015-11-22 DIAGNOSIS — J3081 Allergic rhinitis due to animal (cat) (dog) hair and dander: Secondary | ICD-10-CM | POA: Diagnosis not present

## 2015-11-22 DIAGNOSIS — J3089 Other allergic rhinitis: Secondary | ICD-10-CM | POA: Diagnosis not present

## 2015-11-24 DIAGNOSIS — J3081 Allergic rhinitis due to animal (cat) (dog) hair and dander: Secondary | ICD-10-CM | POA: Diagnosis not present

## 2015-11-24 DIAGNOSIS — J3089 Other allergic rhinitis: Secondary | ICD-10-CM | POA: Diagnosis not present

## 2015-11-24 DIAGNOSIS — J301 Allergic rhinitis due to pollen: Secondary | ICD-10-CM | POA: Diagnosis not present

## 2015-12-06 DIAGNOSIS — J3089 Other allergic rhinitis: Secondary | ICD-10-CM | POA: Diagnosis not present

## 2015-12-06 DIAGNOSIS — J3081 Allergic rhinitis due to animal (cat) (dog) hair and dander: Secondary | ICD-10-CM | POA: Diagnosis not present

## 2015-12-06 DIAGNOSIS — J301 Allergic rhinitis due to pollen: Secondary | ICD-10-CM | POA: Diagnosis not present

## 2015-12-13 DIAGNOSIS — J3081 Allergic rhinitis due to animal (cat) (dog) hair and dander: Secondary | ICD-10-CM | POA: Diagnosis not present

## 2015-12-13 DIAGNOSIS — J301 Allergic rhinitis due to pollen: Secondary | ICD-10-CM | POA: Diagnosis not present

## 2015-12-13 DIAGNOSIS — J3089 Other allergic rhinitis: Secondary | ICD-10-CM | POA: Diagnosis not present

## 2015-12-15 ENCOUNTER — Encounter: Payer: Self-pay | Admitting: Family

## 2015-12-15 ENCOUNTER — Ambulatory Visit (INDEPENDENT_AMBULATORY_CARE_PROVIDER_SITE_OTHER): Payer: Medicare Other

## 2015-12-15 ENCOUNTER — Ambulatory Visit (INDEPENDENT_AMBULATORY_CARE_PROVIDER_SITE_OTHER): Payer: Medicare Other | Admitting: Family

## 2015-12-15 VITALS — BP 126/84 | HR 63 | Temp 97.0°F | Ht 66.5 in | Wt 167.0 lb

## 2015-12-15 DIAGNOSIS — E559 Vitamin D deficiency, unspecified: Secondary | ICD-10-CM | POA: Diagnosis not present

## 2015-12-15 DIAGNOSIS — E038 Other specified hypothyroidism: Secondary | ICD-10-CM

## 2015-12-15 DIAGNOSIS — E785 Hyperlipidemia, unspecified: Secondary | ICD-10-CM | POA: Diagnosis not present

## 2015-12-15 DIAGNOSIS — Z780 Asymptomatic menopausal state: Secondary | ICD-10-CM

## 2015-12-15 DIAGNOSIS — J45909 Unspecified asthma, uncomplicated: Secondary | ICD-10-CM | POA: Diagnosis not present

## 2015-12-15 DIAGNOSIS — E663 Overweight: Secondary | ICD-10-CM

## 2015-12-15 DIAGNOSIS — M8580 Other specified disorders of bone density and structure, unspecified site: Secondary | ICD-10-CM

## 2015-12-15 DIAGNOSIS — K219 Gastro-esophageal reflux disease without esophagitis: Secondary | ICD-10-CM

## 2015-12-15 MED ORDER — LEVOTHYROXINE SODIUM 75 MCG PO TABS: ORAL_TABLET | ORAL | 3 refills | Status: DC

## 2015-12-15 NOTE — Progress Notes (Signed)
Subjective:    Patient ID: Cheryl Perry, female    DOB: [birth year], 70 y.o.   MRN: [id number]  Pt presents to the office today for chronic follow up. Pt had a acute GI bleed in January, and is been followed by GI for that. Pt is followed by allergen specialists annually for her asthma.  Hyperlipidemia  This is a chronic problem. The current episode started more than 1 year ago. The problem is uncontrolled. Recent lipid tests were reviewed and are high. Exacerbating diseases include hypothyroidism. She has no history of diabetes. Pertinent negatives include no leg pain, myalgias or shortness of breath. Current antihyperlipidemic treatment includes diet change and herbal therapy. The current treatment provides mild improvement of lipids. Risk factors for coronary artery disease include dyslipidemia, obesity, post-menopausal, a sedentary lifestyle, family history and stress.  Gastroesophageal Reflux  She complains of heartburn. She reports no belching, no coughing, no hoarse voice, no sore throat or no wheezing. This is a chronic problem. The current episode started more than 1 year ago. The problem occurs occasionally. The problem has been resolved. The heartburn is located in the substernum. The symptoms are aggravated by certain foods. She has tried a PPI for the symptoms. The treatment provided moderate relief.  Thyroid Problem  Presents for follow-up visit. Symptoms include dry skin and hair loss. Patient reports no anxiety, constipation, diarrhea, heat intolerance, hoarse voice, leg swelling, nail problem, palpitations or visual change. The symptoms have been stable. Past treatments include levothyroxine. The treatment provided significant relief. Her past medical history is significant for hyperlipidemia. There is no history of diabetes.  Asthma  There is no cough, hoarse voice, shortness of breath or wheezing. This is a chronic problem. The current episode started more than 1 year ago. The  problem occurs intermittently. The problem has been rapidly improving. Associated symptoms include heartburn. Pertinent negatives include no headaches, myalgias or sore throat. Her symptoms are aggravated by pollen. Her symptoms are alleviated by leukotriene antagonist, rest and beta-agonist. She reports significant improvement on treatment. Her symptoms are not alleviated by rest, ipratropium and leukotriene antagonist. Her past medical history is significant for asthma.      Review of Systems  Constitutional: Negative.   HENT: Negative.  Negative for hoarse voice and sore throat.   Eyes: Negative.   Respiratory: Negative.  Negative for cough, shortness of breath and wheezing.   Cardiovascular: Negative.  Negative for palpitations.  Gastrointestinal: Positive for heartburn. Negative for constipation and diarrhea.  Endocrine: Negative.  Negative for heat intolerance.  Genitourinary: Negative.   Musculoskeletal: Negative.  Negative for myalgias.  Neurological: Negative.  Negative for headaches.  Hematological: Negative.   Psychiatric/Behavioral: Negative.  The patient is not nervous/anxious.   All other systems reviewed and are negative.      Objective:   Physical Exam  Constitutional: She is oriented to person, place, and time. She appears well-developed and well-nourished. No distress.  HENT:  Head: Normocephalic and atraumatic.  Right Ear: External ear normal.  Left Ear: External ear normal.  Nose: Nose normal.  Mouth/Throat: Oropharynx is clear and moist.  Eyes: Pupils are equal, round, and reactive to light.  Neck: Normal range of motion. Neck supple. No thyromegaly present.  Cardiovascular: Normal rate, regular rhythm, normal heart sounds and intact distal pulses.   No murmur heard. Pulmonary/Chest: Effort normal and breath sounds normal. No respiratory distress. She has no wheezes.  Abdominal: Soft. Bowel sounds are normal. She exhibits no distension. There is no  tenderness.   Musculoskeletal: Normal range of motion. She exhibits no edema or tenderness.  Neurological: She is alert and oriented to person, place, and time.  Skin: Skin is warm and dry.  Psychiatric: She has a normal mood and affect. Her behavior is normal. Judgment and thought content normal.  Vitals reviewed.   BP (!) 137/94   Pulse 60   Temp 97 F (36.1 C) (Oral)   Ht 5' 6.5" (1.689 m)   Wt 167 lb (75.8 kg)   BMI 26.55 kg/m       Assessment & Plan:  1. Other specified hypothyroidism - levothyroxine (SYNTHROID, LEVOTHROID) 75 MCG tablet; TAKE 1 TABLET (75 MCG TOTAL)  BY MOUTH DAILY BEFORE BREAKFAST.  Dispense: 90 tablet; Refill: 3 - CMP14+EGFR  2. Uncomplicated asthma, unspecified asthma severity, unspecified whether persistent - CMP14+EGFR  3. Gastroesophageal reflux disease, esophagitis presence not specified - CMP14+EGFR  4. Osteopenia, unspecified location - CMP14+EGFR - DG [facility] DEXA  5. Hyperlipidemia, unspecified hyperlipidemia type - CMP14+EGFR - Lipid panel  6. Overweight (BMI 25.0-29.9) - CMP14+EGFR  7. Vitamin D deficiency - CMP14+EGFR  8. Post-menopause - CMP14+EGFR - DG [facility] DEXA   Continue all meds Labs pending Health Maintenance reviewed Diet and exercise encouraged RTO 6 months   [name], FNP

## 2015-12-15 NOTE — Patient Instructions (Signed)

## 2015-12-16 ENCOUNTER — Other Ambulatory Visit: Payer: Self-pay | Admitting: Family

## 2015-12-16 LAB — LIPID PANEL
CHOL/HDL RATIO: 3.2 ratio (ref 0.0–4.4)
Cholesterol, Total: 274 mg/dL — ABNORMAL HIGH (ref 100–199)
HDL: 85 mg/dL (ref >39)
LDL CALC: 171 mg/dL — AB (ref 0–99)
Triglycerides: 91 mg/dL (ref 0–149)
VLDL CHOLESTEROL CAL: 18 mg/dL (ref 5–40)

## 2015-12-16 LAB — CMP14+EGFR
ALT: 15 IU/L (ref 0–32)
AST: 21 IU/L (ref 0–40)
Albumin/Globulin Ratio: 2.6 — ABNORMAL HIGH (ref 1.2–2.2)
Albumin: 4.5 g/dL (ref 3.5–4.8)
Alkaline Phosphatase: 81 IU/L (ref 39–117)
BUN/Creatinine Ratio: 23 (ref 12–28)
BUN: 19 mg/dL (ref 8–27)
Bilirubin Total: 0.3 mg/dL (ref 0.0–1.2)
CALCIUM: 9.5 mg/dL (ref 8.7–10.3)
CO2: 25 mmol/L (ref 18–29)
CREATININE: 0.83 mg/dL (ref 0.57–1.00)
Chloride: 103 mmol/L (ref 96–106)
GFR, EST AFRICAN AMERICAN: 83 mL/min/{1.73_m2} (ref >59)
GFR, EST NON AFRICAN AMERICAN: 72 mL/min/{1.73_m2} (ref >59)
GLUCOSE: 79 mg/dL (ref 65–99)
Globulin, Total: 1.7 g/dL (ref 1.5–4.5)
Potassium: 4.4 mmol/L (ref 3.5–5.2)
Sodium: 142 mmol/L (ref 134–144)
TOTAL PROTEIN: 6.2 g/dL (ref 6.0–8.5)

## 2015-12-16 MED ORDER — PITAVASTATIN CALCIUM 2 MG PO TABS: 2.0000 mg | ORAL_TABLET | Freq: Every day | ORAL | 3 refills | Status: DC

## 2015-12-22 DIAGNOSIS — J301 Allergic rhinitis due to pollen: Secondary | ICD-10-CM | POA: Diagnosis not present

## 2015-12-22 DIAGNOSIS — J3081 Allergic rhinitis due to animal (cat) (dog) hair and dander: Secondary | ICD-10-CM | POA: Diagnosis not present

## 2015-12-22 DIAGNOSIS — J3089 Other allergic rhinitis: Secondary | ICD-10-CM | POA: Diagnosis not present

## 2015-12-29 DIAGNOSIS — J3081 Allergic rhinitis due to animal (cat) (dog) hair and dander: Secondary | ICD-10-CM | POA: Diagnosis not present

## 2015-12-29 DIAGNOSIS — J301 Allergic rhinitis due to pollen: Secondary | ICD-10-CM | POA: Diagnosis not present

## 2015-12-29 DIAGNOSIS — J3089 Other allergic rhinitis: Secondary | ICD-10-CM | POA: Diagnosis not present

## 2016-01-06 ENCOUNTER — Ambulatory Visit (INDEPENDENT_AMBULATORY_CARE_PROVIDER_SITE_OTHER): Payer: Medicare Other | Admitting: Pharmacist

## 2016-01-06 ENCOUNTER — Encounter: Payer: Self-pay | Admitting: Pharmacist

## 2016-01-06 VITALS — Ht 66.0 in | Wt 168.0 lb

## 2016-01-06 DIAGNOSIS — M8589 Other specified disorders of bone density and structure, multiple sites: Secondary | ICD-10-CM

## 2016-01-06 NOTE — Progress Notes (Signed)
Patient ID: Cheryl Perry, female   DOB: [birth year], 70 y.o.   MRN: [id number]    HPI:  Cheryl Perry is a 70yo female here today to review DEXA results from 12/15/2015.  She does have a history of osteopenia.  She has tried evista in 2011 but discontinued because caused GI upset, which resolved when she stopped evista. She currently is taking calcium '1000mg'$  qd and vitamin D 1000IU daily  Back Pain?  Yes - herniated disc followed by Dr Nelva Bush Kyphosis?  No Prior fracture?  No                                                           PMH: Age at menopause:  70yo Hysterectomy?  No Oophorectomy?  No HRT? Yes - Former.  Type/duration: prempro Steroid Use?  Yes - inhaled / Advair Thyroid med?  Yes History of cancer?  No History of digestive disorders (ie Crohn's)?  Yes - bleeding gastric ulcer related to IBU use.  Occurred 01/2015. Current or previous eating disorders?  No Last Vitamin D Result:  39..4 (06/14/2015) Last GFR Result:  72 (12/15/2015)   FH/SH: Family history of osteoporosis?  No Parent with history of hip fracture?  No Family history of breast cancer?  Yes - paternal great aunt Exercise?  Yes - walks 4x per week Smoking?  No Alcohol?  No    Calcium Assessment Calcium Intake  # of servings/day  Calcium mg  Milk (8 oz), almond 1  x  300  = 300 mg  Yogurt (4 oz) 1 x  200 = '200mg'$   Cheese (1 oz) 0 x  200 = 0  Other Calcium sources   '250mg'$   Ca supplement '1000mg'$  QD = '1000mg'$    Estimated calcium intake per day '1750mg'$     DEXA Results Date of Test T-Score for AP Spine L1-L4 T-Score for Neck Left Hip T-Score for Neckl Right Hip Left Forearm  12/15/2015 -- -1.0 -1.8 -0.6  09/02/2013 -0.6 -0.9 -1.9   06/27/2011 -0.4 -1.0 -1.1   09/10/2007 -0.5 -0.9 -1.4   05/01/2004 -0.2 -0.7      FRAX 10 year estimate: Total FX risk:  11%  (consider medication if >/= 20%) Hip FX risk:  1.9%  (consider medication if >/= 3%)  Assessment: Osteopenia with stable  BMD  Recommendations: 1.  Discussed fracture risk and results of BMD.   2.  recommend Continue calcium '1000mg'$  daily through supplementation and also dietary calcium to get at least '1200mg'$  daily.  Continue OTC vitamin D supplementation  3.  continue weight bearing exercise - 30 minutes at least 4 days per week.   4.  Counseled and educated about fall risk and prevention.  Recheck DEXA:  2 years  Time spent counseling patient:  30 minutes  Cherre Robins, PharmD, CPP

## 2016-01-06 NOTE — Patient Instructions (Signed)
Continue current calcium intake form diet and supplements.  Goal is to get '1200mg'$  per day from diet and supplements combined.  Also continue to take vitamin D supplement.   Continue with daily exercise - walking.  Thi sis very beneficial for your bones and arthritis.   Fall Prevention in the Home Falls can cause injuries and can affect people from all age groups. There are many simple things that you can do to make your home safe and to help prevent falls. What can I do on the outside of my home?  Regularly repair the edges of walkways and driveways and fix any cracks.  Remove high doorway thresholds.  Trim any shrubbery on the main path into your home.  Use bright outdoor lighting.  Clear walkways of debris and clutter, including tools and rocks.  Regularly check that handrails are securely fastened and in good repair. Both sides of any steps should have handrails.  Install guardrails along the edges of any raised decks or porches.  Have leaves, snow, and ice cleared regularly.  Use sand or salt on walkways during winter months.  In the garage, clean up any spills right away, including grease or oil spills. What can I do in the bathroom?  Use night lights.  Install grab bars by the toilet and in the tub and shower. Do not use towel bars as grab bars.  Use non-skid mats or decals on the floor of the tub or shower.  If you need to sit down while you are in the shower, use a plastic, non-slip stool.  Keep the floor dry. Immediately clean up any water that spills on the floor.  Remove soap buildup in the tub or shower on a regular basis.  Attach bath mats securely with double-sided non-slip rug tape.  Remove throw rugs and other tripping hazards from the floor. What can I do in the bedroom?  Use night lights.  Make sure that a bedside light is easy to reach.  Do not use oversized bedding that drapes onto the floor.  Have a firm chair that has side arms to use for  getting dressed.  Remove throw rugs and other tripping hazards from the floor. What can I do in the kitchen?  Clean up any spills right away.  Avoid walking on wet floors.  Place frequently used items in easy-to-reach places.  If you need to reach for something above you, use a sturdy step stool that has a grab bar.  Keep electrical cables out of the way.  Do not use floor polish or wax that makes floors slippery. If you have to use wax, make sure that it is non-skid floor wax.  Remove throw rugs and other tripping hazards from the floor. What can I do in the stairways?  Do not leave any items on the stairs.  Make sure that there are handrails on both sides of the stairs. Fix handrails that are broken or loose. Make sure that handrails are as long as the stairways.  Check any carpeting to make sure that it is firmly attached to the stairs. Fix any carpet that is loose or worn.  Avoid having throw rugs at the top or bottom of stairways, or secure the rugs with carpet tape to prevent them from moving.  Make sure that you have a light switch at the top of the stairs and the bottom of the stairs. If you do not have them, have them installed. What are some other fall prevention tips?  Wear closed-toe shoes that fit well and support your feet. Wear shoes that have rubber soles or low heels.  When you use a stepladder, make sure that it is completely opened and that the sides are firmly locked. Have someone hold the ladder while you are using it. Do not climb a closed stepladder.  Add color or contrast paint or tape to grab bars and handrails in your home. Place contrasting color strips on the first and last steps.  Use mobility aids as needed, such as canes, walkers, scooters, and crutches.  Turn on lights if it is dark. Replace any light bulbs that burn out.  Set up furniture so that there are clear paths. Keep the furniture in the same spot.  Fix any uneven floor  surfaces.  Choose a carpet design that does not hide the edge of steps of a stairway.  Be aware of any and all pets.  Review your medicines with your healthcare provider. Some medicines can cause dizziness or changes in blood pressure, which increase your risk of falling. Talk with your health care provider about other ways that you can decrease your risk of falls. This may include working with a physical therapist or trainer to improve your strength, balance, and endurance. This information is not intended to replace advice given to you by your health care provider. Make sure you discuss any questions you have with your health care provider. Document Released: 12/15/2001 Document Revised: 05/24/2015 Document Reviewed: 01/29/2014 Elsevier Interactive Patient Education  2017 [employer].

## 2016-01-13 ENCOUNTER — Encounter: Payer: Self-pay | Admitting: Family Medicine

## 2016-01-13 ENCOUNTER — Ambulatory Visit (INDEPENDENT_AMBULATORY_CARE_PROVIDER_SITE_OTHER): Payer: Medicare Other | Admitting: Family Medicine

## 2016-01-13 VITALS — BP 122/76 | HR 72 | Temp 97.3°F | Ht 66.0 in | Wt 169.0 lb

## 2016-01-13 DIAGNOSIS — J4541 Moderate persistent asthma with (acute) exacerbation: Secondary | ICD-10-CM

## 2016-01-13 MED ORDER — PREDNISONE 20 MG PO TABS: ORAL_TABLET | ORAL | 0 refills | Status: DC

## 2016-01-13 NOTE — Progress Notes (Signed)
BP 122/76   Pulse 72   Temp 97.3 F (36.3 C) (Oral)   Ht '5\' 6"'$  (1.676 m)   Wt 169 lb (76.7 kg)   BMI 27.28 kg/m    Subjective:    Patient ID: Cheryl Perry, female    DOB: 13-Feb-1945, 71 y.o.   MRN: [id number]  HPI: Cheryl Perry is a 71 y.o. female presenting on 01/13/2016 for Sinusitis (sinus drainage, congestion & pressure, sore throat, productive cough; symptoms began 4 days ago; using Delsym cough syrup)   HPI Sinus congestion and drainage Patient has been having sinus congestion and drainage. She also feels like she is starting to get some early wheezing. She has been having cough that is productive. She says all of these symptoms began 4 days ago and have been worsening over the past few days. She has continued to use her albuterol inhaler and has needed it now 2-3 times daily and continues to have her other maintenance asthma inhalers. She also is on Singulair for maintenance as well. She also has use her Nasacort a couple times in the past few days to try and help with this. She denies any fevers or chills.  Relevant past medical, surgical, family and social history reviewed and updated as indicated. Interim medical history since our last visit reviewed. Allergies and medications reviewed and updated.  Review of Systems  Constitutional: Negative for chills and fever.  HENT: Positive for congestion, postnasal drip, rhinorrhea, sinus pressure and sore throat. Negative for ear discharge, ear pain and sneezing.   Eyes: Negative for pain, redness and visual disturbance.  Respiratory: Positive for cough and wheezing. Negative for chest tightness and shortness of breath.   Cardiovascular: Negative for chest pain and leg swelling.  Genitourinary: Negative for difficulty urinating and dysuria.  Musculoskeletal: Negative for back pain and gait problem.  Skin: Negative for rash.  Neurological: Negative for light-headedness and headaches.  Psychiatric/Behavioral: Negative for  agitation and behavioral problems.  All other systems reviewed and are negative.  Per HPI unless specifically indicated above     Objective:    BP 122/76   Pulse 72   Temp 97.3 F (36.3 C) (Oral)   Ht '5\' 6"'$  (1.676 m)   Wt 169 lb (76.7 kg)   BMI 27.28 kg/m   Wt Readings from Last 3 Encounters:  01/13/16 169 lb (76.7 kg)  01/06/16 168 lb (76.2 kg)  12/15/15 167 lb (75.8 kg)    Physical Exam  Constitutional: She is oriented to person, place, and time. She appears well-developed and well-nourished. No distress.  HENT:  Right Ear: Tympanic membrane, external ear and ear canal normal.  Left Ear: Tympanic membrane, external ear and ear canal normal.  Nose: Mucosal edema and rhinorrhea present. No epistaxis. Right sinus exhibits frontal sinus tenderness. Right sinus exhibits no maxillary sinus tenderness. Left sinus exhibits frontal sinus tenderness. Left sinus exhibits no maxillary sinus tenderness.  Mouth/Throat: Uvula is midline and mucous membranes are normal. Posterior oropharyngeal edema and posterior oropharyngeal erythema present. No oropharyngeal exudate or tonsillar abscesses.  Eyes: Conjunctivae are normal.  Cardiovascular: Normal rate, regular rhythm, normal heart sounds and intact distal pulses.   No murmur heard. Pulmonary/Chest: Effort normal and breath sounds normal. No respiratory distress. She has no wheezes. She has no rales.  Musculoskeletal: Normal range of motion. She exhibits no edema or tenderness.  Neurological: She is alert and oriented to person, place, and time. Coordination normal.  Skin: Skin is warm and dry. No  rash noted. She is not diaphoretic.  Psychiatric: She has a normal mood and affect. Her behavior is normal.  Vitals reviewed.     Assessment & Plan:   Problem List Items Addressed This Visit      Respiratory   Asthma - Primary   Relevant Medications   predniSONE (DELTASONE) 20 MG tablet       Follow up plan: Return if symptoms worsen or  fail to improve.  Counseling provided for all of the vaccine components No orders of the defined types were placed in this encounter.   Cheryl Pina, MD [facility] 01/13/2016, 12:34 PM

## 2016-01-17 ENCOUNTER — Ambulatory Visit (INDEPENDENT_AMBULATORY_CARE_PROVIDER_SITE_OTHER): Payer: Medicare Other | Admitting: Family Medicine

## 2016-01-17 ENCOUNTER — Encounter: Payer: Self-pay | Admitting: Family Medicine

## 2016-01-17 VITALS — BP 119/71 | HR 69 | Temp 97.8°F | Ht 66.0 in | Wt 172.0 lb

## 2016-01-17 DIAGNOSIS — R059 Cough, unspecified: Secondary | ICD-10-CM

## 2016-01-17 DIAGNOSIS — R05 Cough: Secondary | ICD-10-CM | POA: Diagnosis not present

## 2016-01-17 MED ORDER — HYDROCODONE-HOMATROPINE 5-1.5 MG/5ML PO SYRP: 5.0000 mL | ORAL_SOLUTION | Freq: Four times a day (QID) | ORAL | 0 refills | Status: DC | PRN

## 2016-01-17 NOTE — Progress Notes (Signed)
BP 119/71   Pulse 69   Temp 97.8 F (36.6 C) (Oral)   Ht '5\' 6"'$  (1.676 m)   Wt 172 lb (78 kg)   SpO2 96%   BMI 27.76 kg/m    Subjective:    Patient ID: Cheryl Perry, female    DOB: 15-Aug-1945, 71 y.o.   MRN: [id number]  HPI: Cheryl Perry is a 71 y.o. female presenting on 01/17/2016 for Cough (has not improved since seen on 01/13/16; taking Dayquil & Nyquil, finished Prednisone this morning)   HPI Cough Patient is coming in today because her cough is not improved even though her other symptoms of nasal congestion and sinus pressure and drainage have improved the cough is stuck around. The cough is worse at night and is dry and nonproductive. She denies any fevers or chills or shortness of breath or wheezing. She was treated as an asthma exacerbation given prednisone and a lot of the other symptoms have improved except the cough. She says especially at keeping her up at night and that's the most bothersome. She has been using her inhalers as well to try and help with this.  Relevant past medical, surgical, family and social history reviewed and updated as indicated. Interim medical history since our last visit reviewed. Allergies and medications reviewed and updated.  Review of Systems  Constitutional: Negative for chills and fever.  HENT: Positive for postnasal drip, rhinorrhea, sinus pressure and sore throat. Negative for congestion, ear discharge, ear pain and sneezing.   Eyes: Negative for pain, redness and visual disturbance.  Respiratory: Negative for chest tightness and shortness of breath.   Cardiovascular: Negative for chest pain and leg swelling.  Genitourinary: Negative for difficulty urinating and dysuria.  Musculoskeletal: Negative for back pain and gait problem.  Skin: Negative for rash.  Neurological: Negative for light-headedness and headaches.  Psychiatric/Behavioral: Negative for agitation and behavioral problems.  All other systems reviewed and are  negative.   Per HPI unless specifically indicated above   Allergies as of 01/17/2016      Reactions   Naproxen Hives, Itching   Ampicillin    rash   Avelox [moxifloxacin Hcl In Nacl]    Rash   Bactrim [sulfamethoxazole-trimethoprim]    Rash   Levaquin [levofloxacin In D5w]    Rash   Lipitor [atorvastatin]    mylagias   Macrodantin [nitrofurantoin Macrocrystal]    Rash   Premarin [conjugated Estrogens]    Itching tightness in chest   Trental [pentoxifylline Er]    Rash   Livalo [pitavastatin] Other (See Comments)   myalgias      Medication List       Accurate as of 01/17/16  3:43 PM. Always use your most recent med list.          Biotin 300 MCG Tabs Take 300 mcg by mouth daily.   CALCIUM-MAGNESIUM-ZINC PO Take 1 tablet by mouth daily.   fluticasone-salmeterol 45-21 MCG/ACT inhaler Commonly known as:  ADVAIR HFA Inhale 2 puffs into the lungs 2 (two) times daily.   HYDROcodone-homatropine 5-1.5 MG/5ML syrup Commonly known as:  HYCODAN Take 5 mLs by mouth every 6 (six) hours as needed for cough.   hyoscyamine 0.125 MG tablet Commonly known as:  LEVSIN, ANASPAZ Take 0.125 mg by mouth every 4 (four) hours as needed for cramping.   levothyroxine 75 MCG tablet Commonly known as:  SYNTHROID, LEVOTHROID TAKE 1 TABLET (75 MCG TOTAL)  BY MOUTH DAILY BEFORE BREAKFAST.   loratadine 10 MG  tablet Commonly known as:  CLARITIN Take 10 mg by mouth daily.   methocarbamol 500 MG tablet Commonly known as:  ROBAXIN Take 1 tablet (500 mg total) by mouth 3 (three) times daily.   montelukast 10 MG tablet Commonly known as:  SINGULAIR Take 10 mg by mouth at bedtime.   pantoprazole 40 MG tablet Commonly known as:  PROTONIX TAKE 1 TABLET (40 MG TOTAL) BY MOUTH 2 (TWO) TIMES DAILY BEFORE A MEAL.   predniSONE 20 MG tablet Commonly known as:  DELTASONE 2 po at same time daily for 5 days   PROBIOTIC ADVANCED PO Take 1 capsule by mouth daily.   PROVENTIL HFA 108 (90 Base)  MCG/ACT inhaler Generic drug:  albuterol Inhale 2 puffs into the lungs every 6 (six) hours as needed for wheezing. May use only 3 times a month   VITAMIN B COMPLEX PO Take 1 tablet by mouth daily.   Vitamin D 2000 units Caps Take 1 capsule by mouth daily.          Objective:    BP 119/71   Pulse 69   Temp 97.8 F (36.6 C) (Oral)   Ht '5\' 6"'$  (1.676 m)   Wt 172 lb (78 kg)   SpO2 96%   BMI 27.76 kg/m   Wt Readings from Last 3 Encounters:  01/17/16 172 lb (78 kg)  01/13/16 169 lb (76.7 kg)  01/06/16 168 lb (76.2 kg)    Physical Exam  Constitutional: She is oriented to person, place, and time. She appears well-developed and well-nourished. No distress.  HENT:  Right Ear: Tympanic membrane, external ear and ear canal normal.  Left Ear: Tympanic membrane, external ear and ear canal normal.  Nose: Mucosal edema and rhinorrhea present. No epistaxis. Right sinus exhibits no maxillary sinus tenderness and no frontal sinus tenderness. Left sinus exhibits no maxillary sinus tenderness and no frontal sinus tenderness.  Mouth/Throat: Uvula is midline and mucous membranes are normal. Posterior oropharyngeal edema and posterior oropharyngeal erythema present. No oropharyngeal exudate or tonsillar abscesses.  Eyes: Conjunctivae are normal.  Cardiovascular: Normal rate, regular rhythm, normal heart sounds and intact distal pulses.   No murmur heard. Pulmonary/Chest: Effort normal and breath sounds normal. No respiratory distress. She has no wheezes.  Musculoskeletal: Normal range of motion. She exhibits no edema or tenderness.  Neurological: She is alert and oriented to person, place, and time. Coordination normal.  Skin: Skin is warm and dry. No rash noted. She is not diaphoretic.  Psychiatric: She has a normal mood and affect. Her behavior is normal.  Vitals reviewed.     Assessment & Plan:   Problem List Items Addressed This Visit    None    Visit Diagnoses    Cough    -  Primary    Patient has persistent cough, the rest of the symptoms have resolved and she is improving as she just can't get rid of the cough.   Relevant Medications   HYDROcodone-homatropine (HYCODAN) 5-1.5 MG/5ML syrup       Follow up plan: Return if symptoms worsen or fail to improve.  Counseling provided for all of the vaccine components No orders of the defined types were placed in this encounter.   [name], MD [facility] 01/17/2016, 3:43 PM

## 2016-01-30 DIAGNOSIS — J301 Allergic rhinitis due to pollen: Secondary | ICD-10-CM | POA: Diagnosis not present

## 2016-01-30 DIAGNOSIS — J3081 Allergic rhinitis due to animal (cat) (dog) hair and dander: Secondary | ICD-10-CM | POA: Diagnosis not present

## 2016-01-30 DIAGNOSIS — J3089 Other allergic rhinitis: Secondary | ICD-10-CM | POA: Diagnosis not present

## 2016-02-06 DIAGNOSIS — J3081 Allergic rhinitis due to animal (cat) (dog) hair and dander: Secondary | ICD-10-CM | POA: Diagnosis not present

## 2016-02-06 DIAGNOSIS — J301 Allergic rhinitis due to pollen: Secondary | ICD-10-CM | POA: Diagnosis not present

## 2016-02-06 DIAGNOSIS — J3089 Other allergic rhinitis: Secondary | ICD-10-CM | POA: Diagnosis not present

## 2016-02-10 DIAGNOSIS — J3081 Allergic rhinitis due to animal (cat) (dog) hair and dander: Secondary | ICD-10-CM | POA: Diagnosis not present

## 2016-02-10 DIAGNOSIS — J3089 Other allergic rhinitis: Secondary | ICD-10-CM | POA: Diagnosis not present

## 2016-02-10 DIAGNOSIS — J301 Allergic rhinitis due to pollen: Secondary | ICD-10-CM | POA: Diagnosis not present

## 2016-02-17 DIAGNOSIS — J3081 Allergic rhinitis due to animal (cat) (dog) hair and dander: Secondary | ICD-10-CM | POA: Diagnosis not present

## 2016-02-17 DIAGNOSIS — J3089 Other allergic rhinitis: Secondary | ICD-10-CM | POA: Diagnosis not present

## 2016-02-17 DIAGNOSIS — J301 Allergic rhinitis due to pollen: Secondary | ICD-10-CM | POA: Diagnosis not present

## 2016-02-20 ENCOUNTER — Other Ambulatory Visit: Payer: Self-pay | Admitting: Gastroenterology

## 2016-02-24 DIAGNOSIS — J301 Allergic rhinitis due to pollen: Secondary | ICD-10-CM | POA: Diagnosis not present

## 2016-02-24 DIAGNOSIS — J3081 Allergic rhinitis due to animal (cat) (dog) hair and dander: Secondary | ICD-10-CM | POA: Diagnosis not present

## 2016-02-24 DIAGNOSIS — J3089 Other allergic rhinitis: Secondary | ICD-10-CM | POA: Diagnosis not present

## 2016-03-01 DIAGNOSIS — J3089 Other allergic rhinitis: Secondary | ICD-10-CM | POA: Diagnosis not present

## 2016-03-01 DIAGNOSIS — J3081 Allergic rhinitis due to animal (cat) (dog) hair and dander: Secondary | ICD-10-CM | POA: Diagnosis not present

## 2016-03-01 DIAGNOSIS — J301 Allergic rhinitis due to pollen: Secondary | ICD-10-CM | POA: Diagnosis not present

## 2016-03-09 DIAGNOSIS — J3081 Allergic rhinitis due to animal (cat) (dog) hair and dander: Secondary | ICD-10-CM | POA: Diagnosis not present

## 2016-03-09 DIAGNOSIS — J301 Allergic rhinitis due to pollen: Secondary | ICD-10-CM | POA: Diagnosis not present

## 2016-03-09 DIAGNOSIS — J3089 Other allergic rhinitis: Secondary | ICD-10-CM | POA: Diagnosis not present

## 2016-03-16 DIAGNOSIS — J3081 Allergic rhinitis due to animal (cat) (dog) hair and dander: Secondary | ICD-10-CM | POA: Diagnosis not present

## 2016-03-16 DIAGNOSIS — J3089 Other allergic rhinitis: Secondary | ICD-10-CM | POA: Diagnosis not present

## 2016-03-16 DIAGNOSIS — J301 Allergic rhinitis due to pollen: Secondary | ICD-10-CM | POA: Diagnosis not present

## 2016-03-21 DIAGNOSIS — J3089 Other allergic rhinitis: Secondary | ICD-10-CM | POA: Diagnosis not present

## 2016-03-21 DIAGNOSIS — J3081 Allergic rhinitis due to animal (cat) (dog) hair and dander: Secondary | ICD-10-CM | POA: Diagnosis not present

## 2016-03-21 DIAGNOSIS — J301 Allergic rhinitis due to pollen: Secondary | ICD-10-CM | POA: Diagnosis not present

## 2016-03-30 DIAGNOSIS — J3081 Allergic rhinitis due to animal (cat) (dog) hair and dander: Secondary | ICD-10-CM | POA: Diagnosis not present

## 2016-03-30 DIAGNOSIS — J3089 Other allergic rhinitis: Secondary | ICD-10-CM | POA: Diagnosis not present

## 2016-03-30 DIAGNOSIS — J301 Allergic rhinitis due to pollen: Secondary | ICD-10-CM | POA: Diagnosis not present

## 2016-04-10 DIAGNOSIS — J3089 Other allergic rhinitis: Secondary | ICD-10-CM | POA: Diagnosis not present

## 2016-04-10 DIAGNOSIS — J3081 Allergic rhinitis due to animal (cat) (dog) hair and dander: Secondary | ICD-10-CM | POA: Diagnosis not present

## 2016-04-10 DIAGNOSIS — J4540 Moderate persistent asthma, uncomplicated: Secondary | ICD-10-CM | POA: Diagnosis not present

## 2016-04-10 DIAGNOSIS — J301 Allergic rhinitis due to pollen: Secondary | ICD-10-CM | POA: Diagnosis not present

## 2016-04-17 DIAGNOSIS — M5136 Other intervertebral disc degeneration, lumbar region: Secondary | ICD-10-CM | POA: Diagnosis not present

## 2016-04-20 DIAGNOSIS — J3081 Allergic rhinitis due to animal (cat) (dog) hair and dander: Secondary | ICD-10-CM | POA: Diagnosis not present

## 2016-04-20 DIAGNOSIS — J301 Allergic rhinitis due to pollen: Secondary | ICD-10-CM | POA: Diagnosis not present

## 2016-04-20 DIAGNOSIS — J3089 Other allergic rhinitis: Secondary | ICD-10-CM | POA: Diagnosis not present

## 2016-04-27 DIAGNOSIS — J3089 Other allergic rhinitis: Secondary | ICD-10-CM | POA: Diagnosis not present

## 2016-04-27 DIAGNOSIS — J3081 Allergic rhinitis due to animal (cat) (dog) hair and dander: Secondary | ICD-10-CM | POA: Diagnosis not present

## 2016-04-27 DIAGNOSIS — J301 Allergic rhinitis due to pollen: Secondary | ICD-10-CM | POA: Diagnosis not present

## 2016-05-10 ENCOUNTER — Ambulatory Visit (INDEPENDENT_AMBULATORY_CARE_PROVIDER_SITE_OTHER): Payer: Medicare Other | Admitting: Pediatrics

## 2016-05-10 ENCOUNTER — Encounter: Payer: Self-pay | Admitting: Pediatrics

## 2016-05-10 VITALS — BP 122/86 | HR 56 | Temp 97.4°F | Ht 66.0 in | Wt 170.6 lb

## 2016-05-10 DIAGNOSIS — H6502 Acute serous otitis media, left ear: Secondary | ICD-10-CM

## 2016-05-10 DIAGNOSIS — H6121 Impacted cerumen, right ear: Secondary | ICD-10-CM

## 2016-05-10 MED ORDER — AZITHROMYCIN 250 MG PO TABS: ORAL_TABLET | ORAL | 0 refills | Status: DC

## 2016-05-10 NOTE — Progress Notes (Signed)
  Subjective:   Patient ID: Cheryl Perry, female    DOB: 12-23-1945, 71 y.o.   MRN: [id number] CC: Trouble hearing (Flight from [place] on monday )  HPI: Cheryl Perry is a 71 y.o. female presenting for Trouble hearing (Flight from [place] on monday )  Since then has had decreased hearing in both ears Tinnitus has gotten worse in both ears Has had some scratchy sore throat the day after the flight No pain in ears now  Felt nauseous two days ago, had a few episodes of vomiting Feeling slightly queasy but back to normal now [place] she has been eating some soup, drinking plenty of fluids Was an adventurous eater while in [place] No abd pain No fevers Taking claritin daily No fevers   Relevant past medical, surgical, family and social history reviewed. Allergies and medications reviewed and updated. History  Smoking Status  . Former Smoker  . Quit date: 06/06/1974  Smokeless Tobacco  . Never Used    Comment: Quit x 40 years ago   ROS: Per HPI   Objective:    BP 122/86   Pulse (!) 56   Temp 97.4 F (36.3 C) (Oral)   Ht '5\' 6"'$  (1.676 m)   Wt 170 lb 9.6 oz (77.4 kg)   BMI 27.54 kg/m   Wt Readings from Last 3 Encounters:  05/10/16 170 lb 9.6 oz (77.4 kg)  01/17/16 172 lb (78 kg)  01/13/16 169 lb (76.7 kg)    Gen: NAD, alert, cooperative with exam, NCAT EYES: EOMI, no conjunctival injection, or no icterus ENT: R TM with impacted cerumen, L TM intact with red serous fluid behind it, several small red macules soft palate. R ear irrigated gently, not able to visualize TM due to fluid bubbles LYMPH: no cervical LAD CV: NRRR, normal S1/S2, no murmur Resp: CTABL, no wheezes, normal WOB Abd: +BS, soft, NTND. Ext: No edema, warm Neuro: Alert and oriented, strength equal b/l UE and LE, coordination grossly normal MSK: normal muscle bulk Skin: no rash LE  Assessment & Plan:  [name] was seen today for trouble hearing.  Diagnoses and all orders for this visit:  Acute serous  otitis media of left ear, recurrence not specified Red serous fluid behind L TM Treat for AOM with below rtc 3 weeks for recheck, if not improving will refer to ENT -     azithromycin (ZITHROMAX) 250 MG tablet; Take 2 the first day and then one each day after.  Impacted cerumen of right ear Discussed options, risks, benefits. Pt wanted to proceed with irrigation for impaction.   PROCECURE: Impacted cerumen removal: After procedure described to patient and patient agreed with proceeding, cerumen impaction was removed from R TM using irrigation. Not able to visualize TM afterward due to air bubbles and fluid in canal. Pt tolerated procedure well, says felt relief and improved hearing following irrigation.   Follow up plan: Return in about 3 weeks (around 05/31/2016) for follow up. Assunta Found, MD [facility]

## 2016-05-10 NOTE — Patient Instructions (Addendum)
Nasal spray every day Loratadine every day Come back in 2 weeks if not improving, sooner if getting worse

## 2016-05-14 ENCOUNTER — Encounter: Payer: Self-pay | Admitting: Internal Medicine

## 2016-05-18 DIAGNOSIS — J301 Allergic rhinitis due to pollen: Secondary | ICD-10-CM | POA: Diagnosis not present

## 2016-05-18 DIAGNOSIS — J3081 Allergic rhinitis due to animal (cat) (dog) hair and dander: Secondary | ICD-10-CM | POA: Diagnosis not present

## 2016-05-18 DIAGNOSIS — J3089 Other allergic rhinitis: Secondary | ICD-10-CM | POA: Diagnosis not present

## 2016-05-22 ENCOUNTER — Ambulatory Visit (INDEPENDENT_AMBULATORY_CARE_PROVIDER_SITE_OTHER): Payer: Medicare Other | Admitting: Gastroenterology

## 2016-05-22 ENCOUNTER — Encounter: Payer: Self-pay | Admitting: Gastroenterology

## 2016-05-22 VITALS — BP 133/87 | HR 60 | Temp 96.8°F | Ht 66.5 in | Wt 170.8 lb

## 2016-05-22 DIAGNOSIS — K219 Gastro-esophageal reflux disease without esophagitis: Secondary | ICD-10-CM

## 2016-05-22 MED ORDER — RANITIDINE HCL 150 MG PO CAPS: 150.0000 mg | ORAL_CAPSULE | Freq: Two times a day (BID) | ORAL | 3 refills | Status: DC

## 2016-05-22 NOTE — Progress Notes (Signed)
Primary Care Physician: Sharion Balloon, FNP  Primary Gastroenterologist:  Garfield Cornea, MD   Chief Complaint  Patient presents with  . gastric ulcer    f/u, doing ok    HPI: Cheryl Perry is a 71 y.o. female here for one-year follow-up. She has a history of upper GI bleed in early 2017 secondary to multiple NSAID-induced gastric ulcers. Follow-up EGD May 2017 showed that ulcers had completely healed. She reports last colonoscopy around 2015 with internal hemorrhoids and a polyp, next one due in 5 years.  No longer on NSAIDs. Occasional heartburn on pantoprazole. If skips a dose then has heartburn. Symptoms worse When she traveled to [place]. She was using Tums as well. Lots of spicy food. She is concerned about the risk of osteoporosis on chronic PPI therapy. She would like to try to transition to an H2 blocker. BM ok. No melena, brbpr. Uses Lactaid with occasional dairy.     Current Outpatient Prescriptions  Medication Sig Dispense Refill  . albuterol (PROVENTIL HFA) 108 (90 BASE) MCG/ACT inhaler Inhale 2 puffs into the lungs every 6 (six) hours as needed for wheezing. May use only 3 times a month    . B Complex Vitamins (VITAMIN B COMPLEX PO) Take 1 tablet by mouth daily.    . Biotin 300 MCG TABS Take 300 mcg by mouth daily.     Marland Kitchen CALCIUM-MAGNESIUM-ZINC PO Take 1 tablet by mouth daily.    . Cholecalciferol (VITAMIN D) 2000 UNITS CAPS Take 1 capsule by mouth daily.    . fluticasone-salmeterol (ADVAIR HFA) 45-21 MCG/ACT inhaler Inhale 2 puffs into the lungs 2 (two) times daily. 3 Inhaler 3  . levothyroxine (SYNTHROID, LEVOTHROID) 75 MCG tablet TAKE 1 TABLET (75 MCG TOTAL)  BY MOUTH DAILY BEFORE BREAKFAST. 90 tablet 3  . loratadine (CLARITIN) 10 MG tablet Take 10 mg by mouth daily.    . methocarbamol (ROBAXIN) 500 MG tablet Take 1 tablet (500 mg total) by mouth 3 (three) times daily. (Patient taking differently: Take 500 mg by mouth every 8 (eight) hours as needed for muscle  spasms. ) 90 tablet 1  . montelukast (SINGULAIR) 10 MG tablet Take 10 mg by mouth at bedtime.    . pantoprazole (PROTONIX) 40 MG tablet Take 1 tablet (40 mg total) by mouth daily. 30 tablet 5  . Probiotic Product (PROBIOTIC ADVANCED PO) Take 1 capsule by mouth daily.      No current facility-administered medications for this visit.     Allergies as of 05/22/2016 - Review Complete 05/22/2016  Allergen Reaction Noted  . Naproxen Hives and Itching 09/02/2013  . Ampicillin  06/05/2012  . Avelox [moxifloxacin hcl in nacl]  06/05/2012  . Bactrim [sulfamethoxazole-trimethoprim]  06/05/2012  . Levaquin [levofloxacin in d5w]  06/05/2012  . Lipitor [atorvastatin]  06/05/2012  . Macrodantin [nitrofurantoin macrocrystal]  06/05/2012  . Premarin [conjugated estrogens]  06/05/2012  . Trental [pentoxifylline er]  06/05/2012  . Livalo [pitavastatin] Other (See Comments) 01/06/2016    ROS:  General: Negative for anorexia, weight loss, fever, chills, fatigue, weakness. ENT: Negative for hoarseness, difficulty swallowing , nasal congestion. CV: Negative for chest pain, angina, palpitations, dyspnea on exertion, peripheral edema.  Respiratory: Negative for dyspnea at rest, dyspnea on exertion, cough, sputum, wheezing.  GI: See history of present illness. GU:  Negative for dysuria, hematuria, urinary incontinence, urinary frequency, nocturnal urination.  Endo: Negative for unusual weight change.    Physical Examination:   BP 133/87  Pulse 60   Temp (!) 96.8 F (36 C) (Oral)   Ht 5' 6.5" (1.689 m)   Wt 170 lb 12.8 oz (77.5 kg)   BMI 27.15 kg/m   General: Well-nourished, well-developed in no acute distress.  Eyes: No icterus. Mouth: Oropharyngeal mucosa moist and pink , no lesions erythema or exudate. Lungs: Clear to auscultation bilaterally.  Heart: Regular rate and rhythm, no murmurs rubs or gallops.  Abdomen: Bowel sounds are normal, nontender, nondistended, no hepatosplenomegaly or  masses, no abdominal bruits or hernia , no rebound or guarding.   Extremities: No lower extremity edema. No clubbing or deformities. Neuro: Alert and oriented x 4   Skin: Warm and dry, no jaundice.   Psych: Alert and cooperative, normal mood and affect.

## 2016-05-22 NOTE — Patient Instructions (Signed)
1. I have sent a prescription for ranitidine 150 mg twice daily to [facility]. If you tolerate once daily dosing that is fine as well. Stop pantoprazole. 2. Return to the office in one year or call sooner if her symptoms are not well controlled.   Food Choices for Gastroesophageal Reflux Disease, Adult When you have gastroesophageal reflux disease (GERD), the foods you eat and your eating habits are very important. Choosing the right foods can help ease the discomfort of GERD. Consider working with a diet and nutrition specialist (dietitian) to help you make healthy food choices. What general guidelines should I follow? Eating plan   Choose healthy foods low in fat, such as fruits, vegetables, whole grains, low-fat dairy products, and lean meat, fish, and poultry.  Eat frequent, small meals instead of three large meals each day. Eat your meals slowly, in a relaxed setting. Avoid bending over or lying down until 2-3 hours after eating.  Limit high-fat foods such as fatty meats or fried foods.  Limit your intake of oils, butter, and shortening to less than 8 teaspoons each day.  Avoid the following:  Foods that cause symptoms. These may be different for different people. Keep a food diary to keep track of foods that cause symptoms.  Alcohol.  Drinking large amounts of liquid with meals.  Eating meals during the 2-3 hours before bed.  Cook foods using methods other than frying. This may include baking, grilling, or broiling. Lifestyle    Maintain a healthy weight. Ask your health care provider what weight is healthy for you. If you need to lose weight, work with your health care provider to do so safely.  Exercise for at least 30 minutes on 5 or more days each week, or as told by your health care provider.  Avoid wearing clothes that fit tightly around your waist and chest.  Do not use any products that contain nicotine or tobacco, such as cigarettes and e-cigarettes. If you need  help quitting, ask your health care provider.  Sleep with the head of your bed raised. Use a wedge under the mattress or blocks under the bed frame to raise the head of the bed. What foods are not recommended? The items listed may not be a complete list. Talk with your dietitian about what dietary choices are best for you. Grains  Pastries or quick breads with added fat. [place] toast. Vegetables  Deep fried vegetables. [place] fries. Any vegetables prepared with added fat. Any vegetables that cause symptoms. For some people this may include tomatoes and tomato products, chili peppers, onions and garlic, and horseradish. Fruits  Any fruits prepared with added fat. Any fruits that cause symptoms. For some people this may include citrus fruits, such as oranges, grapefruit, pineapple, and lemons. Meats and other protein foods  High-fat meats, such as fatty beef or pork, hot dogs, ribs, ham, sausage, salami and bacon. Fried meat or protein, including fried fish and fried chicken. Nuts and nut butters. Dairy  Whole milk and chocolate milk. Sour cream. Cream. Ice cream. Cream cheese. Milk shakes. Beverages  Coffee and tea, with or without caffeine. Carbonated beverages. Sodas. Energy drinks. Fruit juice made with acidic fruits (such as orange or grapefruit). Tomato juice. Alcoholic drinks. Fats and oils  Butter. Margarine. Shortening. Ghee. Sweets and desserts  Chocolate and cocoa. Donuts. Seasoning and other foods  Pepper. Peppermint and spearmint. Any condiments, herbs, or seasonings that cause symptoms. For some people, this may include curry, hot sauce, or vinegar-based  salad dressings. Summary  When you have gastroesophageal reflux disease (GERD), food and lifestyle choices are very important to help ease the discomfort of GERD.  Eat frequent, small meals instead of three large meals each day. Eat your meals slowly, in a relaxed setting. Avoid bending over or lying down until 2-3 hours after  eating.  Limit high-fat foods such as fatty meat or fried foods. This information is not intended to replace advice given to you by your health care provider. Make sure you discuss any questions you have with your health care provider. Document Released: 12/25/2004 Document Revised: 12/27/2015 Document Reviewed: 12/27/2015 Elsevier Interactive Patient Education  2017 [employer].

## 2016-05-23 NOTE — Progress Notes (Signed)
Requested records.

## 2016-05-23 NOTE — Progress Notes (Signed)
cc'ed to pcp °

## 2016-05-23 NOTE — Progress Notes (Signed)
Please get a copy of patient's last 2 colonoscopy and path report from Dr. Earlean Shawl.

## 2016-05-23 NOTE — Assessment & Plan Note (Signed)
71 year old female with history of radius gastric ulcer in the setting of NSAID use in January 2017 who presents for follow-up. Clinically she is doing well. Last EGD in May 2017 indicated healing of ulcers, she had a normal-appearing esophagus and a small hiatal hernia. She would like to try to come off of PPI therapy if possible. She does have some intermittent heartburn when she skips a dose. Would try transitioning to Zantac 150 mg once to twice daily initially. We did discuss tachyphylaxis and the need for drug holiday if she loses efficacy. We also discussed that if she has refractory symptoms on H2 blocker, she will likely need to go back on PPI therapy. Continue to avoid NSAIDs. I'll with any relapse in symptoms. We'll see her back in one year. Currently her colonoscopy is up-to-date.

## 2016-05-24 ENCOUNTER — Encounter: Payer: Self-pay | Admitting: Pediatrics

## 2016-05-24 ENCOUNTER — Ambulatory Visit (INDEPENDENT_AMBULATORY_CARE_PROVIDER_SITE_OTHER): Payer: Medicare Other | Admitting: Pediatrics

## 2016-05-24 VITALS — BP 116/79 | HR 62 | Temp 97.4°F | Ht 66.5 in | Wt 171.8 lb

## 2016-05-24 DIAGNOSIS — H9193 Unspecified hearing loss, bilateral: Secondary | ICD-10-CM

## 2016-05-24 DIAGNOSIS — H6690 Otitis media, unspecified, unspecified ear: Secondary | ICD-10-CM | POA: Diagnosis not present

## 2016-05-24 NOTE — Progress Notes (Signed)
  Subjective:   Patient ID: Cheryl Perry, female    DOB: [birth year], 71 y.o.   MRN: [id number] CC: Follow-up TM effusion HPI: Cheryl Perry is a 71 y.o. female presenting for Follow-up  Seen 2 weeks ago with bloody TM effusion following flight from [place] No pain in ears Feels like hearing is still affected Says she was having some hearing trouble before   No fevers, nausea resolved Having worsening allergy symptoms recently Taking claritin, flonase daily  Relevant past medical, surgical, family and social history reviewed. Allergies and medications reviewed and updated. History  Smoking Status  . Former Smoker  . Quit date: 06/06/1974  Smokeless Tobacco  . Never Used    Comment: Quit x 40 years ago   ROS: Per HPI   Objective:    BP 116/79   Pulse 62   Temp 97.4 F (36.3 C) (Oral)   Ht 5' 6.5" (1.689 m)   Wt 171 lb 12.8 oz (77.9 kg)   BMI 27.31 kg/m   Wt Readings from Last 3 Encounters:  05/24/16 171 lb 12.8 oz (77.9 kg)  05/22/16 170 lb 12.8 oz (77.5 kg)  05/10/16 170 lb 9.6 oz (77.4 kg)    Gen: NAD, alert, cooperative with exam, NCAT EYES: EOMI, no conjunctival injection, or no icterus ENT:  L TM pink, dull, no effusion, R TM pearly with white effusion with air bubbles present, OP without erythema LYMPH: no cervical LAD CV: NRRR, normal S1/S2, no murmur, distal pulses 2+ b/l Resp: CTABL, no wheezes, normal WOB Ext: No edema, warm Neuro: Alert and oriented  Assessment & Plan:  Cheryl Perry was seen today for follow-up effusion.  Diagnoses and all orders for this visit:  Hearing problem of both ears -     Ambulatory referral to Audiology  TM effusion Improved on abx Some residual effusion, no pain Ongoing allergy symptoms Cont flonase, claritin If still with hearing problems will get in to see audiology Thinks having some hearing trouble before recent effusion/AOM  Follow up plan: Return for as scheduled. Assunta Found, MD [place]

## 2016-05-31 DIAGNOSIS — J3089 Other allergic rhinitis: Secondary | ICD-10-CM | POA: Diagnosis not present

## 2016-05-31 DIAGNOSIS — J301 Allergic rhinitis due to pollen: Secondary | ICD-10-CM | POA: Diagnosis not present

## 2016-05-31 DIAGNOSIS — J3081 Allergic rhinitis due to animal (cat) (dog) hair and dander: Secondary | ICD-10-CM | POA: Diagnosis not present

## 2016-06-07 ENCOUNTER — Telehealth: Payer: Self-pay | Admitting: Internal Medicine

## 2016-06-07 NOTE — Telephone Encounter (Signed)
Disregard phone note. Patient called back to say that her prescription showed up in the mail today.

## 2016-06-07 NOTE — Telephone Encounter (Signed)
Pt was calling to say that she needed her randitidine 150 mg prescription sent to her mail order pharmacy (meds by mail through [facility]). She uses [facility] as a Field se[facility]ologist.

## 2016-06-14 ENCOUNTER — Encounter: Payer: Self-pay | Admitting: Family

## 2016-06-14 ENCOUNTER — Ambulatory Visit (INDEPENDENT_AMBULATORY_CARE_PROVIDER_SITE_OTHER): Payer: Medicare Other | Admitting: Family

## 2016-06-14 VITALS — BP 134/82 | HR 52 | Temp 97.3°F | Ht 66.5 in | Wt 170.4 lb

## 2016-06-14 DIAGNOSIS — Z8719 Personal history of other diseases of the digestive system: Secondary | ICD-10-CM

## 2016-06-14 DIAGNOSIS — E559 Vitamin D deficiency, unspecified: Secondary | ICD-10-CM | POA: Diagnosis not present

## 2016-06-14 DIAGNOSIS — E038 Other specified hypothyroidism: Secondary | ICD-10-CM | POA: Diagnosis not present

## 2016-06-14 DIAGNOSIS — E663 Overweight: Secondary | ICD-10-CM

## 2016-06-14 DIAGNOSIS — K219 Gastro-esophageal reflux disease without esophagitis: Secondary | ICD-10-CM

## 2016-06-14 DIAGNOSIS — J301 Allergic rhinitis due to pollen: Secondary | ICD-10-CM | POA: Diagnosis not present

## 2016-06-14 DIAGNOSIS — M8589 Other specified disorders of bone density and structure, multiple sites: Secondary | ICD-10-CM | POA: Diagnosis not present

## 2016-06-14 DIAGNOSIS — J3089 Other allergic rhinitis: Secondary | ICD-10-CM | POA: Diagnosis not present

## 2016-06-14 DIAGNOSIS — J45909 Unspecified asthma, uncomplicated: Secondary | ICD-10-CM

## 2016-06-14 DIAGNOSIS — E785 Hyperlipidemia, unspecified: Secondary | ICD-10-CM | POA: Diagnosis not present

## 2016-06-14 DIAGNOSIS — J3081 Allergic rhinitis due to animal (cat) (dog) hair and dander: Secondary | ICD-10-CM | POA: Diagnosis not present

## 2016-06-14 NOTE — Patient Instructions (Signed)

## 2016-06-14 NOTE — Progress Notes (Signed)
Subjective:    Patient ID: Cheryl Perry, female    DOB: 10-22-1945, 71 y.o.   MRN: [id number]  Pt presents to the office today for chronic follow up.  Pt had a acute GI bleed in January 2017, and is been followed by GI annually. Pt is followed by allergen specialists annually for her asthma.  Hyperlipidemia  This is a chronic problem. The current episode started more than 1 year ago. The problem is uncontrolled. Recent lipid tests were reviewed and are high. Exacerbating diseases include obesity. Current antihyperlipidemic treatment includes diet change. The current treatment provides mild improvement of lipids. Risk factors for coronary artery disease include diabetes mellitus, dyslipidemia, obesity, post-menopausal and a sedentary lifestyle.  Asthma  There is no cough, difficulty breathing, sputum production or wheezing. This is a chronic problem. The current episode started more than 1 year ago. The problem occurs intermittently. The problem has been resolved. Pertinent negatives include no heartburn. Her past medical history is significant for asthma.  Gastroesophageal Reflux  She reports no belching, no coughing, no heartburn or no wheezing. This is a chronic problem. The current episode started more than 1 year ago. The problem occurs occasionally. The symptoms are aggravated by certain foods and lying down. Pertinent negatives include no fatigue. She has tried a histamine-2 antagonist for the symptoms. The treatment provided mild relief.  Thyroid Problem  Presents for follow-up visit. Symptoms include constipation. Patient reports no diarrhea, fatigue, leg swelling or visual change. The symptoms have been stable. Her past medical history is significant for hyperlipidemia.  Osteopenia  Last Dexascan 12/23/15. Pt currently taking Vit D and Calcium.    Review of Systems  Constitutional: Negative for fatigue.  Respiratory: Negative for cough, sputum production and wheezing.     Gastrointestinal: Positive for constipation. Negative for diarrhea and heartburn.  All other systems reviewed and are negative.      Objective:   Physical Exam  Constitutional: She is oriented to person, place, and time. She appears well-developed and well-nourished. No distress.  HENT:  Head: Normocephalic and atraumatic.  Right Ear: External ear normal.  Left Ear: External ear normal.  Nose: Nose normal.  Mouth/Throat: Oropharynx is clear and moist.  Eyes: Pupils are equal, round, and reactive to light.  Cardiovascular: Normal rate, regular rhythm, normal heart sounds and intact distal pulses.   No murmur heard. Pulmonary/Chest: Effort normal and breath sounds normal. No respiratory distress. She has no wheezes.  Abdominal: Soft. Bowel sounds are normal. She exhibits no distension. There is no tenderness.  Musculoskeletal: Normal range of motion. She exhibits no edema or tenderness.  Neurological: She is alert and oriented to person, place, and time.  Skin: Skin is warm and dry.  Psychiatric: She has a normal mood and affect. Her behavior is normal. Judgment and thought content normal.  Vitals reviewed.    BP 134/82   Pulse (!) 52   Temp 97.3 F (36.3 C) (Oral)   Ht 5' 6.5" (1.689 m)   Wt 170 lb 6.4 oz (77.3 kg)   BMI 27.09 kg/m      Assessment & Plan:  1. Uncomplicated asthma, unspecified asthma severity, unspecified whether persistent - CMP14+EGFR - CBC with Differential/Platelet  2. Gastroesophageal reflux disease without esophagitis - CMP14+EGFR - CBC with Differential/Platelet  3. Other specified hypothyroidism  - CMP14+EGFR - CBC with Differential/Platelet - Thyroid Panel With TSH  4. Osteopenia of multiple sites - CMP14+EGFR - CBC with Differential/Platelet - VITAMIN D 25 Hydroxy (Vit-D Deficiency,  Fractures)  5. Overweight (BMI 25.0-29.9) - CMP14+EGFR - CBC with Differential/Platelet  6. Hyperlipidemia, unspecified hyperlipidemia type -  CMP14+EGFR - CBC with Differential/Platelet - Lipid panel  7. History of lower GI bleeding  - CMP14+EGFR - CBC with Differential/Platelet  8. Vitamin D deficiency - CMP14+EGFR - CBC with Differential/Platelet - VITAMIN D 25 Hydroxy (Vit-D Deficiency, Fractures)   Continue all meds Labs pending Health Maintenance reviewed Diet and exercise encouraged RTO 6 months   [name], FNP

## 2016-06-15 LAB — CMP14+EGFR
A/G RATIO: 2.2 (ref 1.2–2.2)
ALK PHOS: 81 IU/L (ref 39–117)
ALT: 19 IU/L (ref 0–32)
AST: 27 IU/L (ref 0–40)
Albumin: 4.3 g/dL (ref 3.5–4.8)
BILIRUBIN TOTAL: 0.2 mg/dL (ref 0.0–1.2)
BUN/Creatinine Ratio: 19 (ref 12–28)
BUN: 16 mg/dL (ref 8–27)
CO2: 23 mmol/L (ref 18–29)
Calcium: 9.5 mg/dL (ref 8.7–10.3)
Chloride: 106 mmol/L (ref 96–106)
Creatinine, Ser: 0.84 mg/dL (ref 0.57–1.00)
GFR calc non Af Amer: 71 mL/min/{1.73_m2} (ref >59)
GFR, EST AFRICAN AMERICAN: 81 mL/min/{1.73_m2} (ref >59)
GLUCOSE: 82 mg/dL (ref 65–99)
Globulin, Total: 2 g/dL (ref 1.5–4.5)
POTASSIUM: 4.5 mmol/L (ref 3.5–5.2)
Sodium: 144 mmol/L (ref 134–144)
TOTAL PROTEIN: 6.3 g/dL (ref 6.0–8.5)

## 2016-06-15 LAB — CBC WITH DIFFERENTIAL/PLATELET
Basophils Absolute: 0 10*3/uL (ref 0.0–0.2)
Basos: 1 %
EOS (ABSOLUTE): 0.3 10*3/uL (ref 0.0–0.4)
Eos: 5 %
HEMOGLOBIN: 14.4 g/dL (ref 11.1–15.9)
Hematocrit: 43.5 % (ref 34.0–46.6)
IMMATURE GRANS (ABS): 0 10*3/uL (ref 0.0–0.1)
IMMATURE GRANULOCYTES: 0 %
LYMPHS: 26 %
Lymphocytes Absolute: 1.7 10*3/uL (ref 0.7–3.1)
MCH: 31.5 pg (ref 26.6–33.0)
MCHC: 33.1 g/dL (ref 31.5–35.7)
MCV: 95 fL (ref 79–97)
MONOCYTES: 10 %
Monocytes Absolute: 0.6 10*3/uL (ref 0.1–0.9)
NEUTROS PCT: 58 %
Neutrophils Absolute: 3.7 10*3/uL (ref 1.4–7.0)
PLATELETS: 255 10*3/uL (ref 150–379)
RBC: 4.57 x10E6/uL (ref 3.77–5.28)
RDW: 13.3 % (ref 12.3–15.4)
WBC: 6.3 10*3/uL (ref 3.4–10.8)

## 2016-06-15 LAB — LIPID PANEL
CHOLESTEROL TOTAL: 273 mg/dL — AB (ref 100–199)
Chol/HDL Ratio: 3.3 ratio (ref 0.0–4.4)
HDL: 82 mg/dL (ref >39)
LDL CALC: 171 mg/dL — AB (ref 0–99)
TRIGLYCERIDES: 98 mg/dL (ref 0–149)
VLDL CHOLESTEROL CAL: 20 mg/dL (ref 5–40)

## 2016-06-15 LAB — THYROID PANEL WITH TSH
Free Thyroxine Index: 2 (ref 1.2–4.9)
T3 Uptake Ratio: 26 % (ref 24–39)
T4 TOTAL: 7.6 ug/dL (ref 4.5–12.0)
TSH: 4.5 u[IU]/mL (ref 0.450–4.500)

## 2016-06-15 LAB — VITAMIN D 25 HYDROXY (VIT D DEFICIENCY, FRACTURES): VIT D 25 HYDROXY: 40.1 ng/mL (ref 30.0–100.0)

## 2016-06-21 DIAGNOSIS — J3089 Other allergic rhinitis: Secondary | ICD-10-CM | POA: Diagnosis not present

## 2016-06-21 DIAGNOSIS — J301 Allergic rhinitis due to pollen: Secondary | ICD-10-CM | POA: Diagnosis not present

## 2016-06-21 DIAGNOSIS — J3081 Allergic rhinitis due to animal (cat) (dog) hair and dander: Secondary | ICD-10-CM | POA: Diagnosis not present

## 2016-06-27 DIAGNOSIS — J3081 Allergic rhinitis due to animal (cat) (dog) hair and dander: Secondary | ICD-10-CM | POA: Diagnosis not present

## 2016-06-27 DIAGNOSIS — J301 Allergic rhinitis due to pollen: Secondary | ICD-10-CM | POA: Diagnosis not present

## 2016-06-27 DIAGNOSIS — J3089 Other allergic rhinitis: Secondary | ICD-10-CM | POA: Diagnosis not present

## 2016-06-27 DIAGNOSIS — Z1231 Encounter for screening mammogram for malignant neoplasm of breast: Secondary | ICD-10-CM | POA: Diagnosis not present

## 2016-07-17 DIAGNOSIS — M5136 Other intervertebral disc degeneration, lumbar region: Secondary | ICD-10-CM | POA: Diagnosis not present

## 2016-07-19 DIAGNOSIS — J3089 Other allergic rhinitis: Secondary | ICD-10-CM | POA: Diagnosis not present

## 2016-07-19 DIAGNOSIS — J301 Allergic rhinitis due to pollen: Secondary | ICD-10-CM | POA: Diagnosis not present

## 2016-07-19 DIAGNOSIS — J3081 Allergic rhinitis due to animal (cat) (dog) hair and dander: Secondary | ICD-10-CM | POA: Diagnosis not present

## 2016-07-25 DIAGNOSIS — J301 Allergic rhinitis due to pollen: Secondary | ICD-10-CM | POA: Diagnosis not present

## 2016-07-25 DIAGNOSIS — J3081 Allergic rhinitis due to animal (cat) (dog) hair and dander: Secondary | ICD-10-CM | POA: Diagnosis not present

## 2016-07-25 DIAGNOSIS — H17821 Peripheral opacity of cornea, right eye: Secondary | ICD-10-CM | POA: Diagnosis not present

## 2016-07-25 DIAGNOSIS — H04123 Dry eye syndrome of bilateral lacrimal glands: Secondary | ICD-10-CM | POA: Diagnosis not present

## 2016-07-25 DIAGNOSIS — J3089 Other allergic rhinitis: Secondary | ICD-10-CM | POA: Diagnosis not present

## 2016-07-25 DIAGNOSIS — H2513 Age-related nuclear cataract, bilateral: Secondary | ICD-10-CM | POA: Diagnosis not present

## 2016-07-26 DIAGNOSIS — J301 Allergic rhinitis due to pollen: Secondary | ICD-10-CM | POA: Diagnosis not present

## 2016-07-26 DIAGNOSIS — J3081 Allergic rhinitis due to animal (cat) (dog) hair and dander: Secondary | ICD-10-CM | POA: Diagnosis not present

## 2016-07-26 DIAGNOSIS — J3089 Other allergic rhinitis: Secondary | ICD-10-CM | POA: Diagnosis not present

## 2016-08-03 DIAGNOSIS — J3081 Allergic rhinitis due to animal (cat) (dog) hair and dander: Secondary | ICD-10-CM | POA: Diagnosis not present

## 2016-08-03 DIAGNOSIS — J3089 Other allergic rhinitis: Secondary | ICD-10-CM | POA: Diagnosis not present

## 2016-08-03 DIAGNOSIS — J301 Allergic rhinitis due to pollen: Secondary | ICD-10-CM | POA: Diagnosis not present

## 2016-08-09 DIAGNOSIS — J3081 Allergic rhinitis due to animal (cat) (dog) hair and dander: Secondary | ICD-10-CM | POA: Diagnosis not present

## 2016-08-09 DIAGNOSIS — J3089 Other allergic rhinitis: Secondary | ICD-10-CM | POA: Diagnosis not present

## 2016-08-09 DIAGNOSIS — J301 Allergic rhinitis due to pollen: Secondary | ICD-10-CM | POA: Diagnosis not present

## 2016-08-17 DIAGNOSIS — J301 Allergic rhinitis due to pollen: Secondary | ICD-10-CM | POA: Diagnosis not present

## 2016-08-17 DIAGNOSIS — J3081 Allergic rhinitis due to animal (cat) (dog) hair and dander: Secondary | ICD-10-CM | POA: Diagnosis not present

## 2016-08-17 DIAGNOSIS — J3089 Other allergic rhinitis: Secondary | ICD-10-CM | POA: Diagnosis not present

## 2016-08-21 ENCOUNTER — Ambulatory Visit: Payer: Medicare Other | Attending: Pediatrics | Admitting: Audiology

## 2016-08-21 DIAGNOSIS — Z9889 Other specified postprocedural states: Secondary | ICD-10-CM | POA: Insufficient documentation

## 2016-08-21 DIAGNOSIS — R292 Abnormal reflex: Secondary | ICD-10-CM | POA: Insufficient documentation

## 2016-08-21 DIAGNOSIS — H90A22 Sensorineural hearing loss, unilateral, left ear, with restricted hearing on the contralateral side: Secondary | ICD-10-CM | POA: Insufficient documentation

## 2016-08-21 DIAGNOSIS — H90A31 Mixed conductive and sensorineural hearing loss, unilateral, right ear with restricted hearing on the contralateral side: Secondary | ICD-10-CM | POA: Diagnosis not present

## 2016-08-21 DIAGNOSIS — H9313 Tinnitus, bilateral: Secondary | ICD-10-CM | POA: Diagnosis not present

## 2016-08-21 DIAGNOSIS — H93299 Other abnormal auditory perceptions, unspecified ear: Secondary | ICD-10-CM

## 2016-08-21 NOTE — Procedures (Signed)
[facility] and [place]  [place], [place] [address]  [phone]   Audiological Evaluation  Patient Name: Cheryl Perry   Status: Outpatient   DOB: 05-01-1945    Diagnosis: Hearing Loss                 MRN: [id number] Date:  08/21/2016     Referent: Dr. Assunta Found  History: [name] was seen for an audiological evaluation.  Primary Concern: When to [place] in April - ears plugged and on the return trip, they "never unplugged" and "had to be treated for an ear infection". Pain: None History of hearing problems: Y -  In 1985 History of ear infections:  Y - in April 2018, prior to this had not had an ear infection for a long time ago. History of ear surgery: Had stapedectomy in 1986 on the right side with Dr. Ernesto Rutherford, ENT.  History of dizziness/vertigo:   Y / N History of balance issues:  Y / N Tinnitus: Y - started in 1985 - sounds like a "whoosh" or blowing. Rates tinnitus as a "4-5" - it is annoying.  Sound sensitivity: Y to piercing sounds. History of occupational noise exposure: N History of hypertension: N History of diabetes:  N Family history of hearing loss when older: Family history of tinnitus.       Evaluation: Conventional pure tone audiometry from 250Hz  - 8000Hz  with using insert earphones.  Hearing Thresholds: Right ear:  Thresholds of 70-75 dBHL from 250Hz  - 5000Hz  85-95 dBHL from 1000Hz  - 3000Hz ; 110dBHL at 4000Hz  and no response at 8000Hz . The hearing loss appears mixed.  Left ear:    Thresholds of 45 dBHL at 250Hz ; 30 dBHL at 500Hz  and 2000Hz ; 20 dBHL at 1000Hz ; 60 dBHL at 4000hz  an 80 dBHL at 8000Hz . The hearing loss appears sensorineural.  Reliability is good Speech reception levels (repeating words near threshold) using recorded spondee word lists:  Right ear: 75 dBHL with contralateral masking  Left ear:  25/30 dBHL Word recognition (at comfortably loud volumes) using recorded NU-6 word lists, in quiet.   Right ear: 42% at 95 dBHL with 85 dB contralateral masking.  Left ear:   96% at 65 dBHL (MCL) Tympanometry (middle ear function) shows normal middle ear volume and pressure bilaterally.  Right ear: Normal (Type Ad) with excessive compliance and absent acoustic reflexes from 500Hz  - 4000Hz  (note: this ear had a stapedectomy in 1985).  Left ear: Normal (Type A) with acoustic reflex that range from 100dB to no response from 500Hz  - 4000Hz .  Tinnitus matching appeared to be approximately 75 dBHL using speech noise (equivalent to a restaurant or loud talking).  Gabriella may be a good candidate for tinnitus masking  or tinnitus intervention.  CONCLUSION:      Cheryl Perry has an asymmetrical hearing loss that is poorer in the right ear, but this is a long standing hearing loss that started in 1985 and this ear had a stapedectomy.  Hearing loss in the right ear is severe in the low frequencies sloping to a profound high frequency hearing loss mixed hearing loss. The right ear has poor word recognition, even at loud levels.  The left ear has a mild to moderate low frequency hearing loss that improves to normal at 1000Hz  and drops in the high frequencies to a moderate to severe high frequency hearing loss that appears sensorineural. The left ear has excellent word recognition in quiet at conversational speech levels.  Cheryl Perry has had tinnitus since 1985. Currently she reports it as "annoying and constant". Using speech noise, which KEYARI KLEEMAN states sounds similar to the tinnitus, it was matched at 75 dB which would be audible in most listening environments.  As discussed with Cheryl Perry referral to an ENT with consideration of a tinnitus masker and amplification is strongly recommended. Cheryl Perry would like to be referred to Dr. Vicie Mutters, ENT. The test results were discussed and Cheryl Perry counseled.  RECOMMENDATIONS: 1.   Refer to ENT for the a) bilateral hearing loss  b) significant  tinnitus c) to evaluate concerns that hearing has not improved to pre April 2018 levels. 2.   A hearing aid evaluation with consideration of a tinnitus masker.  3.   Monitor hearing closely with a repeat audiological evaluation in 3 months (earlier if there is any change in hearing or ear pressure).  This appointment may be completed with the ENT or other audiologist. 4.   To minimize the adverse effects of tinnitus 1) avoid quiet  2) use noise maskers at home such as a sound machine, quiet music, a fan or other background noise at a volume just loud enough to mask the high pitched tinnitus. 3) The [employer] Relax Tinnitus app was discussed. 5.  Strategies that help improve hearing include: A) Face the speaker directly. Optimal is having the speakers face well - lit.  Unless amplified, being within 3-6 feet of the speaker will enhance word recognition. B) Avoid having the speaker back-lit as this will minimize the ability to use cues from lip-reading, facial expression and gestures. C)  Word recognition is poorer in background noise. For optimal word recognition, turn off the TV, radio or noisy fan when engaging in conversation. In a restaurant, try to sit away from noise sources and close to the primary speaker.  D)  Ask for topic clarification from time to time in order to remain in the conversation.  Most people don't mind repeating or clarifying a point when asked.  If needed, explain the difficulty hearing in background noise or hearing loss. 6.   F/U with Dr. Assunta Found for further recommendations.  Amaliya Whitelaw L. Heide Spark, Au.D., CCC-A Doctor of Audiology 08/21/2016

## 2016-08-29 DIAGNOSIS — J301 Allergic rhinitis due to pollen: Secondary | ICD-10-CM | POA: Diagnosis not present

## 2016-08-29 DIAGNOSIS — J3081 Allergic rhinitis due to animal (cat) (dog) hair and dander: Secondary | ICD-10-CM | POA: Diagnosis not present

## 2016-08-29 DIAGNOSIS — J3089 Other allergic rhinitis: Secondary | ICD-10-CM | POA: Diagnosis not present

## 2016-09-06 DIAGNOSIS — J3081 Allergic rhinitis due to animal (cat) (dog) hair and dander: Secondary | ICD-10-CM | POA: Diagnosis not present

## 2016-09-06 DIAGNOSIS — J301 Allergic rhinitis due to pollen: Secondary | ICD-10-CM | POA: Diagnosis not present

## 2016-09-06 DIAGNOSIS — J3089 Other allergic rhinitis: Secondary | ICD-10-CM | POA: Diagnosis not present

## 2016-09-14 DIAGNOSIS — J3089 Other allergic rhinitis: Secondary | ICD-10-CM | POA: Diagnosis not present

## 2016-09-14 DIAGNOSIS — J3081 Allergic rhinitis due to animal (cat) (dog) hair and dander: Secondary | ICD-10-CM | POA: Diagnosis not present

## 2016-09-14 DIAGNOSIS — J301 Allergic rhinitis due to pollen: Secondary | ICD-10-CM | POA: Diagnosis not present

## 2016-09-17 DIAGNOSIS — J301 Allergic rhinitis due to pollen: Secondary | ICD-10-CM | POA: Diagnosis not present

## 2016-09-17 DIAGNOSIS — J3089 Other allergic rhinitis: Secondary | ICD-10-CM | POA: Diagnosis not present

## 2016-09-17 DIAGNOSIS — J3081 Allergic rhinitis due to animal (cat) (dog) hair and dander: Secondary | ICD-10-CM | POA: Diagnosis not present

## 2016-09-21 DIAGNOSIS — M545 Low back pain: Secondary | ICD-10-CM | POA: Diagnosis not present

## 2016-09-21 DIAGNOSIS — M5137 Other intervertebral disc degeneration, lumbosacral region: Secondary | ICD-10-CM | POA: Diagnosis not present

## 2016-09-25 DIAGNOSIS — J3089 Other allergic rhinitis: Secondary | ICD-10-CM | POA: Diagnosis not present

## 2016-09-25 DIAGNOSIS — J301 Allergic rhinitis due to pollen: Secondary | ICD-10-CM | POA: Diagnosis not present

## 2016-09-25 DIAGNOSIS — J3081 Allergic rhinitis due to animal (cat) (dog) hair and dander: Secondary | ICD-10-CM | POA: Diagnosis not present

## 2016-10-04 DIAGNOSIS — J3081 Allergic rhinitis due to animal (cat) (dog) hair and dander: Secondary | ICD-10-CM | POA: Diagnosis not present

## 2016-10-04 DIAGNOSIS — J301 Allergic rhinitis due to pollen: Secondary | ICD-10-CM | POA: Diagnosis not present

## 2016-10-04 DIAGNOSIS — J3089 Other allergic rhinitis: Secondary | ICD-10-CM | POA: Diagnosis not present

## 2016-10-10 ENCOUNTER — Ambulatory Visit (INDEPENDENT_AMBULATORY_CARE_PROVIDER_SITE_OTHER): Payer: Medicare Other

## 2016-10-10 DIAGNOSIS — Z23 Encounter for immunization: Secondary | ICD-10-CM

## 2016-10-15 DIAGNOSIS — J3089 Other allergic rhinitis: Secondary | ICD-10-CM | POA: Diagnosis not present

## 2016-10-15 DIAGNOSIS — J301 Allergic rhinitis due to pollen: Secondary | ICD-10-CM | POA: Diagnosis not present

## 2016-10-15 DIAGNOSIS — J3081 Allergic rhinitis due to animal (cat) (dog) hair and dander: Secondary | ICD-10-CM | POA: Diagnosis not present

## 2016-10-22 DIAGNOSIS — J3089 Other allergic rhinitis: Secondary | ICD-10-CM | POA: Diagnosis not present

## 2016-10-22 DIAGNOSIS — J3081 Allergic rhinitis due to animal (cat) (dog) hair and dander: Secondary | ICD-10-CM | POA: Diagnosis not present

## 2016-10-22 DIAGNOSIS — J301 Allergic rhinitis due to pollen: Secondary | ICD-10-CM | POA: Diagnosis not present

## 2016-10-29 DIAGNOSIS — J3089 Other allergic rhinitis: Secondary | ICD-10-CM | POA: Diagnosis not present

## 2016-10-29 DIAGNOSIS — J3081 Allergic rhinitis due to animal (cat) (dog) hair and dander: Secondary | ICD-10-CM | POA: Diagnosis not present

## 2016-10-29 DIAGNOSIS — J301 Allergic rhinitis due to pollen: Secondary | ICD-10-CM | POA: Diagnosis not present

## 2016-11-05 DIAGNOSIS — J301 Allergic rhinitis due to pollen: Secondary | ICD-10-CM | POA: Diagnosis not present

## 2016-11-05 DIAGNOSIS — J3089 Other allergic rhinitis: Secondary | ICD-10-CM | POA: Diagnosis not present

## 2016-11-05 DIAGNOSIS — J3081 Allergic rhinitis due to animal (cat) (dog) hair and dander: Secondary | ICD-10-CM | POA: Diagnosis not present

## 2016-11-12 DIAGNOSIS — J3089 Other allergic rhinitis: Secondary | ICD-10-CM | POA: Diagnosis not present

## 2016-11-12 DIAGNOSIS — J301 Allergic rhinitis due to pollen: Secondary | ICD-10-CM | POA: Diagnosis not present

## 2016-11-12 DIAGNOSIS — J3081 Allergic rhinitis due to animal (cat) (dog) hair and dander: Secondary | ICD-10-CM | POA: Diagnosis not present

## 2016-11-19 DIAGNOSIS — J3081 Allergic rhinitis due to animal (cat) (dog) hair and dander: Secondary | ICD-10-CM | POA: Diagnosis not present

## 2016-11-19 DIAGNOSIS — J3089 Other allergic rhinitis: Secondary | ICD-10-CM | POA: Diagnosis not present

## 2016-11-19 DIAGNOSIS — J301 Allergic rhinitis due to pollen: Secondary | ICD-10-CM | POA: Diagnosis not present

## 2016-11-27 DIAGNOSIS — J3089 Other allergic rhinitis: Secondary | ICD-10-CM | POA: Diagnosis not present

## 2016-11-27 DIAGNOSIS — J3081 Allergic rhinitis due to animal (cat) (dog) hair and dander: Secondary | ICD-10-CM | POA: Diagnosis not present

## 2016-11-27 DIAGNOSIS — J301 Allergic rhinitis due to pollen: Secondary | ICD-10-CM | POA: Diagnosis not present

## 2016-12-06 DIAGNOSIS — J3081 Allergic rhinitis due to animal (cat) (dog) hair and dander: Secondary | ICD-10-CM | POA: Diagnosis not present

## 2016-12-06 DIAGNOSIS — J3089 Other allergic rhinitis: Secondary | ICD-10-CM | POA: Diagnosis not present

## 2016-12-06 DIAGNOSIS — J301 Allergic rhinitis due to pollen: Secondary | ICD-10-CM | POA: Diagnosis not present

## 2016-12-13 DIAGNOSIS — J3089 Other allergic rhinitis: Secondary | ICD-10-CM | POA: Diagnosis not present

## 2016-12-13 DIAGNOSIS — J301 Allergic rhinitis due to pollen: Secondary | ICD-10-CM | POA: Diagnosis not present

## 2016-12-13 DIAGNOSIS — J3081 Allergic rhinitis due to animal (cat) (dog) hair and dander: Secondary | ICD-10-CM | POA: Diagnosis not present

## 2016-12-14 ENCOUNTER — Encounter: Payer: Self-pay | Admitting: Family

## 2016-12-14 ENCOUNTER — Ambulatory Visit (INDEPENDENT_AMBULATORY_CARE_PROVIDER_SITE_OTHER): Payer: Medicare Other | Admitting: Family

## 2016-12-14 VITALS — BP 114/67 | HR 57 | Temp 97.3°F | Ht 66.5 in | Wt 166.8 lb

## 2016-12-14 DIAGNOSIS — J45909 Unspecified asthma, uncomplicated: Secondary | ICD-10-CM

## 2016-12-14 DIAGNOSIS — E559 Vitamin D deficiency, unspecified: Secondary | ICD-10-CM | POA: Diagnosis not present

## 2016-12-14 DIAGNOSIS — K219 Gastro-esophageal reflux disease without esophagitis: Secondary | ICD-10-CM

## 2016-12-14 DIAGNOSIS — E038 Other specified hypothyroidism: Secondary | ICD-10-CM

## 2016-12-14 DIAGNOSIS — M545 Low back pain, unspecified: Secondary | ICD-10-CM

## 2016-12-14 DIAGNOSIS — G8929 Other chronic pain: Secondary | ICD-10-CM

## 2016-12-14 DIAGNOSIS — M549 Dorsalgia, unspecified: Secondary | ICD-10-CM | POA: Insufficient documentation

## 2016-12-14 DIAGNOSIS — R0602 Shortness of breath: Secondary | ICD-10-CM | POA: Diagnosis not present

## 2016-12-14 DIAGNOSIS — E785 Hyperlipidemia, unspecified: Secondary | ICD-10-CM

## 2016-12-14 DIAGNOSIS — E663 Overweight: Secondary | ICD-10-CM | POA: Diagnosis not present

## 2016-12-14 DIAGNOSIS — R5383 Other fatigue: Secondary | ICD-10-CM

## 2016-12-14 DIAGNOSIS — Z8719 Personal history of other diseases of the digestive system: Secondary | ICD-10-CM | POA: Diagnosis not present

## 2016-12-14 DIAGNOSIS — R6889 Other general symptoms and signs: Secondary | ICD-10-CM | POA: Diagnosis not present

## 2016-12-14 LAB — HEMOGLOBIN, FINGERSTICK: HEMOGLOBIN: 14.4 g/dL (ref 11.1–15.9)

## 2016-12-14 MED ORDER — LEVOTHYROXINE SODIUM 75 MCG PO TABS: ORAL_TABLET | ORAL | 3 refills | Status: DC

## 2016-12-14 NOTE — Progress Notes (Addendum)
Subjective:    Patient ID: Cheryl Perry, female    DOB: May 07, 1945, 71 y.o.   MRN: [id number]  Pt presents to the office today for chronic follow up.  Pt had a acute GI bleed in January 2017, and is been followed by GI annually. Pt is followed by allergen specialists annually for her asthma. PT is followed by Ortho every 2 months for injections.   Pt states she has noticed a little more SOB with walking long distances. Pt states she is worried about another GI bleed since this is how she presented last time. Denies any blood in stools, tarry, or dark stools.  Asthma  She complains of hoarse voice and shortness of breath (at times). There is no cough or wheezing. The current episode started more than 1 year ago. The problem occurs intermittently. The problem has been rapidly improving. Associated symptoms include heartburn. Her past medical history is significant for asthma.  Gastroesophageal Reflux  She complains of heartburn and a hoarse voice. She reports no belching, no coughing or no wheezing. This is a chronic problem. The current episode started more than 1 year ago. The problem occurs occasionally. The problem has been waxing and waning. Associated symptoms include fatigue. She has tried a PPI for the symptoms. The treatment provided moderate relief.  Thyroid Problem  Presents for follow-up visit. Symptoms include dry skin, fatigue and hoarse voice. Patient reports no constipation or diaphoresis. The symptoms have been stable. Her past medical history is significant for hyperlipidemia.  Hyperlipidemia  This is a chronic problem. The current episode started more than 1 year ago. The problem is uncontrolled. Recent lipid tests were reviewed and are high. Exacerbating diseases include obesity. Associated symptoms include shortness of breath (at times). Current antihyperlipidemic treatment includes nicotinic acid. The current treatment provides no improvement of lipids.  Back Pain  This is a  chronic problem. The current episode started more than 1 year ago. The problem occurs intermittently. The problem has been waxing and waning since onset. The pain is present in the lumbar spine. The pain is at a severity of 5/10. The pain is mild.      Review of Systems  Constitutional: Positive for fatigue. Negative for diaphoresis.  HENT: Positive for hoarse voice.   Respiratory: Positive for shortness of breath (at times). Negative for cough and wheezing.   Gastrointestinal: Positive for heartburn. Negative for constipation.  Musculoskeletal: Positive for back pain.  All other systems reviewed and are negative.      Objective:   Physical Exam  Constitutional: She is oriented to person, place, and time. She appears well-developed and well-nourished. No distress.  HENT:  Head: Normocephalic and atraumatic.  Right Ear: External ear normal.  Left Ear: External ear normal.  Nose: Nose normal.  Mouth/Throat: Oropharynx is clear and moist.  Eyes: Pupils are equal, round, and reactive to light.  Neck: Normal range of motion. Neck supple. No thyromegaly present.  Cardiovascular: Normal rate, regular rhythm, normal heart sounds and intact distal pulses.  No murmur heard. Pulmonary/Chest: Effort normal and breath sounds normal. No respiratory distress. She has no wheezes.  Abdominal: Soft. Bowel sounds are normal. She exhibits no distension. There is tenderness (mild RLQ ).  Musculoskeletal: Normal range of motion. She exhibits tenderness (low back pain with flexion and extension ). She exhibits no edema.  Neurological: She is alert and oriented to person, place, and time.  Skin: Skin is warm and dry.  Psychiatric: She has a normal mood  and affect. Her behavior is normal. Judgment and thought content normal.  Vitals reviewed.     BP 114/67   Pulse (!) 57   Temp (!) 97.3 F (36.3 C) (Oral)   Ht 5' 6.5" (1.689 m)   Wt 166 lb 12.8 oz (75.7 kg)   SpO2 97%   BMI 26.52 kg/m        Assessment & Plan:  1. Other specified hypothyroidism - levothyroxine (SYNTHROID, LEVOTHROID) 75 MCG tablet; TAKE 1 TABLET (75 MCG TOTAL)  BY MOUTH DAILY BEFORE BREAKFAST.  Dispense: 90 tablet; Refill: 3 - CMP14+EGFR - TSH  2. Uncomplicated asthma, unspecified asthma severity, unspecified whether persistent - CMP14+EGFR  3. Gastroesophageal reflux disease without esophagitis - CMP14+EGFR  4. Hyperlipidemia, unspecified hyperlipidemia type - CMP14+EGFR  5. Vitamin D deficiency - CMP14+EGFR - VITAMIN D 25 Hydroxy (Vit-D Deficiency, Fractures)  6. History of lower GI bleeding - Anemia Profile B - CMP14+EGFR - Hemoglobin, fingerstick -FOBT  7. Overweight (BMI 25.0-29.9) - CMP14+EGFR  8. Fatigue, unspecified type - CMP14+EGFR - Hemoglobin, fingerstick  9. SOBOE (shortness of breath on exertion) - Anemia Profile B - CMP14+EGFR - Hemoglobin, fingerstick -FOBT  10. Chronic bilateral low back pain without sciatica    Continue all meds and keep all appts with specialists  Labs pending Health Maintenance reviewed Diet and exercise encouraged RTO 6 months   [name], FNP

## 2016-12-14 NOTE — Patient Instructions (Signed)

## 2016-12-14 NOTE — Addendum Note (Signed)
Addended by: [name] A on: 12/14/2016 09:15 AM   Modules accepted: Orders

## 2016-12-15 LAB — CMP14+EGFR
ALBUMIN: 4.2 g/dL (ref 3.5–4.8)
ALK PHOS: 74 IU/L (ref 39–117)
ALT: 15 IU/L (ref 0–32)
AST: 21 IU/L (ref 0–40)
Albumin/Globulin Ratio: 2.2 (ref 1.2–2.2)
BILIRUBIN TOTAL: 0.3 mg/dL (ref 0.0–1.2)
BUN / CREAT RATIO: 21 (ref 12–28)
BUN: 16 mg/dL (ref 8–27)
CHLORIDE: 102 mmol/L (ref 96–106)
CO2: 25 mmol/L (ref 20–29)
CREATININE: 0.77 mg/dL (ref 0.57–1.00)
Calcium: 9.7 mg/dL (ref 8.7–10.3)
GFR calc Af Amer: 90 mL/min/{1.73_m2} (ref >59)
GFR calc non Af Amer: 78 mL/min/{1.73_m2} (ref >59)
GLUCOSE: 89 mg/dL (ref 65–99)
Globulin, Total: 1.9 g/dL (ref 1.5–4.5)
Potassium: 4.6 mmol/L (ref 3.5–5.2)
Sodium: 138 mmol/L (ref 134–144)
Total Protein: 6.1 g/dL (ref 6.0–8.5)

## 2016-12-15 LAB — ANEMIA PROFILE B
BASOS ABS: 0 10*3/uL (ref 0.0–0.2)
Basos: 0 %
EOS (ABSOLUTE): 0.2 10*3/uL (ref 0.0–0.4)
Eos: 4 %
Ferritin: 58 ng/mL (ref 15–150)
Hematocrit: 44 % (ref 34.0–46.6)
Hemoglobin: 14.5 g/dL (ref 11.1–15.9)
IMMATURE GRANULOCYTES: 0 %
Immature Grans (Abs): 0 10*3/uL (ref 0.0–0.1)
Iron Saturation: 34 % (ref 15–55)
Iron: 102 ug/dL (ref 27–139)
LYMPHS ABS: 1.6 10*3/uL (ref 0.7–3.1)
Lymphs: 28 %
MCH: 32.4 pg (ref 26.6–33.0)
MCHC: 33 g/dL (ref 31.5–35.7)
MCV: 98 fL — AB (ref 79–97)
MONOS ABS: 0.6 10*3/uL (ref 0.1–0.9)
Monocytes: 11 %
NEUTROS ABS: 3.1 10*3/uL (ref 1.4–7.0)
NEUTROS PCT: 57 %
PLATELETS: 247 10*3/uL (ref 150–379)
RBC: 4.47 x10E6/uL (ref 3.77–5.28)
RDW: 13.1 % (ref 12.3–15.4)
Retic Ct Pct: 0.8 % (ref 0.6–2.6)
TIBC: 304 ug/dL (ref 250–450)
UIBC: 202 ug/dL (ref 118–369)
VITAMIN B 12: 712 pg/mL (ref 232–1245)
WBC: 5.6 10*3/uL (ref 3.4–10.8)

## 2016-12-15 LAB — VITAMIN D 25 HYDROXY (VIT D DEFICIENCY, FRACTURES): VIT D 25 HYDROXY: 39.3 ng/mL (ref 30.0–100.0)

## 2016-12-15 LAB — TSH: TSH: 4.31 u[IU]/mL (ref 0.450–4.500)

## 2016-12-24 DIAGNOSIS — J3089 Other allergic rhinitis: Secondary | ICD-10-CM | POA: Diagnosis not present

## 2016-12-24 DIAGNOSIS — J301 Allergic rhinitis due to pollen: Secondary | ICD-10-CM | POA: Diagnosis not present

## 2016-12-24 DIAGNOSIS — J3081 Allergic rhinitis due to animal (cat) (dog) hair and dander: Secondary | ICD-10-CM | POA: Diagnosis not present

## 2017-01-03 DIAGNOSIS — J301 Allergic rhinitis due to pollen: Secondary | ICD-10-CM | POA: Diagnosis not present

## 2017-01-03 DIAGNOSIS — J3081 Allergic rhinitis due to animal (cat) (dog) hair and dander: Secondary | ICD-10-CM | POA: Diagnosis not present

## 2017-01-03 DIAGNOSIS — J3089 Other allergic rhinitis: Secondary | ICD-10-CM | POA: Diagnosis not present

## 2017-01-09 DIAGNOSIS — J3089 Other allergic rhinitis: Secondary | ICD-10-CM | POA: Diagnosis not present

## 2017-01-09 DIAGNOSIS — J301 Allergic rhinitis due to pollen: Secondary | ICD-10-CM | POA: Diagnosis not present

## 2017-01-09 DIAGNOSIS — J3081 Allergic rhinitis due to animal (cat) (dog) hair and dander: Secondary | ICD-10-CM | POA: Diagnosis not present

## 2017-01-16 ENCOUNTER — Ambulatory Visit (INDEPENDENT_AMBULATORY_CARE_PROVIDER_SITE_OTHER): Payer: Medicare Other | Admitting: *Deleted

## 2017-01-16 VITALS — BP 137/83 | HR 57 | Temp 97.3°F | Ht 66.5 in | Wt 168.0 lb

## 2017-01-16 DIAGNOSIS — Z0000 Encounter for general adult medical examination without abnormal findings: Secondary | ICD-10-CM | POA: Diagnosis not present

## 2017-01-16 NOTE — Patient Instructions (Signed)
  Cheryl Perry , Thank you for taking time to come for your Medicare Wellness Visit. I appreciate your ongoing commitment to your health goals. Please review the following plan we discussed and let me know if I can assist you in the future.   These are the goals we discussed: Goals    . Increase physical activity     Stay active, travel - spend time with Grandchildren     . Weight < 160 lb (72.576 kg)       This is a list of the screening recommended for you and due dates:  Health Maintenance  Topic Date Due  . DEXA scan (bone density measurement)  12/22/2017  . Colon Cancer Screening  01/08/2018  . Mammogram  06/28/2018  . Tetanus Vaccine  06/10/2023  . Flu Shot  Completed  .  Hepatitis C: One time screening is recommended by Center for Disease Control  (CDC) for  adults born from [birth year] through 1965.   Completed  . Pneumonia vaccines  Completed     Pt will keep follow up with [facility]. We will recommend you get a EKG and CXR - if needed at your next visit  Bring in a copy of the advanced directives when you get a chance

## 2017-01-16 NOTE — Progress Notes (Signed)
Subjective:   SHABRIA EGLEY is a 72 y.o. female who presents for Medicare Annual (Subsequent) preventive examination. He is retired from working [employer] for years. She enjoys reading, working puzzles and working with her church. She is very active in the church and her community. She helps with meals of hope, goes to a ladies group and helps with some different charities. She walks for exercise and she eats a very healthy diet. She lives at home with her husband and she has 2 children, one lives close and the other in [place]. She has several pets and is aware of fall risk and hazards. She states that her health is about the same as it was a year ago.   Cardiac Risk Factors include: advanced age (>18men, >12 women)     Objective:     Vitals: BP 137/83 (BP Location: Right Arm)   Pulse (!) 57   Temp (!) 97.3 F (36.3 C) (Oral)   Ht 5' 6.5" (1.689 m)   Wt 168 lb (76.2 kg)   BMI 26.71 kg/m   Body mass index is 26.71 kg/m.  Advanced Directives 01/16/2017 05/26/2015 03/22/2015 01/28/2015 01/27/2015 01/27/2015 10/18/2014  Does Patient Have a Medical Advance Directive? Yes Yes Yes Yes Yes No Yes  Type of Advance Directive Living will [place];Living will [place];Living will Living will Living will - Living will;Healthcare Power of Attorney  Does patient want to make changes to medical advance directive? - - - - No - Patient declined - -  Copy of [place] in Chart? - No - copy requested - No - copy requested No - copy requested No - copy requested No - copy requested    Tobacco Social History   Tobacco Use  Smoking Status Former Smoker  . Last attempt to quit: 06/06/1974  . Years since quitting: 42.6  Smokeless Tobacco Never Used  Tobacco Comment   Quit x 40 years ago     Counseling given: Not Answered Comment: Quit x 40 years ago   Clinical Intake:                       Past Medical History:    Diagnosis Date  . Allergy   . Arthritis    back pain   . Asthma   . Back pain   . GERD (gastroesophageal reflux disease)   . Hiatal hernia   . Hyperlipidemia   . Osteopenia   . Thyroid disease   . Vasculopathy    lymphatic   Past Surgical History:  Procedure Laterality Date  . COLONOSCOPY    . CYSTOSCOPY    . cystscopy    . ESOPHAGOGASTRODUODENOSCOPY N/A 01/28/2015   Dr.Rourk- 3 small prepyloric/gastric ulcers- likely culprits causing bleeding. hiatal hernia, small gastic polyps not manipulated.  . ESOPHAGOGASTRODUODENOSCOPY N/A 05/26/2015   Procedure: ESOPHAGOGASTRODUODENOSCOPY (EGD);  Surgeon: Daneil Dolin, MD;  Location: [facility];  Service: Endoscopy;  Laterality: N/A;  1145  . KNEE SURGERY    . TONSILLECTOMY    . TUBAL LIGATION     Family History  Problem Relation Age of Onset  . Stroke Mother   . Hypertension Mother   . Alzheimer's disease Mother   . Heart disease Sister   . Hypertension Sister   . Heart attack Sister        Psychologist, forensic   . Arthritis Daughter        very bad MVA   .  Hypertension Son   . Seizures Son    Social History   Socioeconomic History  . Marital status: Married    Spouse name: None  . Number of children: None  . Years of education: None  . Highest education level: None  Social Needs  . Financial resource strain: None  . Food insecurity - worry: None  . Food insecurity - inability: None  . Transportation needs - medical: None  . Transportation needs - non-medical: None  Occupational History  . Occupation: retired     Comment: HR work  / Astronomer   Tobacco Use  . Smoking status: Former Smoker    Last attempt to quit: 06/06/1974    Years since quitting: 42.6  . Smokeless tobacco: Never Used  . Tobacco comment: Quit x 40 years ago  Substance and Sexual Activity  . Alcohol use: No    Alcohol/week: 0.0 oz  . Drug use: No  . Sexual activity: None  Other Topics Concern  . None  Social History Narrative   . None    Outpatient Encounter Medications as of 01/16/2017  Medication Sig  . albuterol (PROVENTIL HFA) 108 (90 BASE) MCG/ACT inhaler Inhale 2 puffs into the lungs every 6 (six) hours as needed for wheezing. May use only 3 times a month  . B Complex Vitamins (VITAMIN B COMPLEX PO) Take 1 tablet by mouth daily.  . Biotin 300 MCG TABS Take 300 mcg by mouth daily.   Marland Kitchen CALCIUM-MAGNESIUM-ZINC PO Take 1 tablet by mouth daily.  . Cholecalciferol (VITAMIN D) 2000 UNITS CAPS Take 1 capsule by mouth daily.  . fluticasone-salmeterol (ADVAIR HFA) 45-21 MCG/ACT inhaler Inhale 2 puffs into the lungs 2 (two) times daily.  Marland Kitchen levothyroxine (SYNTHROID, LEVOTHROID) 75 MCG tablet TAKE 1 TABLET (75 MCG TOTAL)  BY MOUTH DAILY BEFORE BREAKFAST.  Marland Kitchen loratadine (CLARITIN) 10 MG tablet Take 10 mg by mouth daily.  . methocarbamol (ROBAXIN) 500 MG tablet Take 1 tablet (500 mg total) by mouth 3 (three) times daily. (Patient taking differently: Take 500 mg by mouth every 8 (eight) hours as needed for muscle spasms. )  . montelukast (SINGULAIR) 10 MG tablet Take 10 mg by mouth at bedtime.  . niacin 500 MG tablet Take 500 mg by mouth at bedtime.  . Probiotic Product (PROBIOTIC ADVANCED PO) Take 1 capsule by mouth daily.   . ranitidine (ZANTAC) 150 MG capsule Take 1 capsule (150 mg total) by mouth 2 (two) times daily.   No facility-administered encounter medications on file as of 01/16/2017.     Activities of Daily Living In your present state of health, do you have any difficulty performing the following activities: 01/16/2017  Hearing? Y  Comment tinnitis, some hearing loss   Vision? Y  Comment RX glasses   Difficulty concentrating or making decisions? N  Walking or climbing stairs? N  Dressing or bathing? N  Doing errands, shopping? N  Preparing Food and eating ? N  Using the Toilet? N  In the past six months, have you accidently leaked urine? Y  Comment bladder drop? - she has seen DRs   Do you have problems with  loss of bowel control? N  Managing your Medications? N  Managing your Finances? N  Housekeeping or managing your Housekeeping? N  Some recent data might be hidden    Patient Care Team: Sharion Balloon, FNP as PCP - General (Family Medicine) Clent Jacks, MD as Consulting Physician (Ophthalmology) Gala Romney Cristopher Estimable, MD as Consulting  Physician (Gastroenterology) Suella Broad, MD as Consulting Physician (Physical Medicine and Rehabilitation) Iran Planas, MD as Consulting Physician (Orthopedic Surgery) Richmond Campbell, MD as Consulting Physician (Gastroenterology)    Assessment:   This is a routine wellness examination for Harmani.  Exercise Activities and Dietary recommendations Current Exercise Habits: Home exercise routine, Type of exercise: walking, Time (Minutes): 10, Frequency (Times/Week): 7, Weekly Exercise (Minutes/Week): 70, Intensity: Mild, Exercise limited by: None identified  Goals    . Increase physical activity     Stay active, travel - spend time with Grandchildren     . Weight < 160 lb (72.576 kg)       Fall Risk Fall Risk  01/16/2017 12/14/2016 06/14/2016 05/24/2016 05/10/2016  Falls in the past year? No No No No No   Is the patient's home free of loose throw rugs in walkways, pet beds, electrical cords, etc?   Fall risk, precautions and hazards discussed.   Depression Screen PHQ 2/9 Scores 01/16/2017 12/14/2016 06/14/2016 05/24/2016  PHQ - 2 Score 0 1 0 0  PHQ- 9 Score - - - -     Cognitive Function MMSE - Mini Mental State Exam 01/16/2017 10/18/2014  Orientation to time 5 5  Orientation to Place 5 5  Registration 3 3  Attention/ Calculation 5 5  Recall 3 3  Language- name 2 objects 2 2  Language- repeat 1 1  Language- follow 3 step command 3 3  Language- read & follow direction 1 1  Write a sentence 1 1  Copy design 1 1  Total score 30 30        Immunization History  Administered Date(s) Administered  . Influenza, High Dose Seasonal PF 10/11/2015,  10/10/2016  . Influenza,inj,Quad PF,6+ Mos 10/18/2014  . Influenza-Unspecified 12/09/2012, 11/09/2013  . Pneumococcal Conjugate-13 12/09/2013  . Pneumococcal-Unspecified 04/09/2011  . Tdap 06/09/2003, 06/09/2013  . Zoster 04/09/2010    Qualifies for Shingles Vaccine? Already had   Screening Tests Health Maintenance  Topic Date Due  . DEXA SCAN  12/22/2017  . COLONOSCOPY  01/08/2018  . MAMMOGRAM  06/28/2018  . TETANUS/TDAP  06/10/2023  . INFLUENZA VACCINE  Completed  . Hepatitis C Screening  Completed  . PNA vac Low Risk Adult  Completed    Cancer Screenings: Lung: Low Dose CT Chest recommended if Age 31-80 years, 30 pack-year currently smoking OR have quit w/in 15years. Patient does qualify. Breast:  Up to date on Mammogram? Yes   Up to date of Bone Density/Dexa? Yes   Additional Screenings:  Hepatitis B/HIV/Syphillis: Hepatitis C Screening:  Declines    Plan:   pt is to keep follow up with [name] She will bring in a copy of her advanced directives  She may need a EKG and CXR at next visit - if approved by PCP  I have personally reviewed and noted the following in the patient's chart:   . Medical and social history . Use of alcohol, tobacco or illicit drugs  . Current medications and supplements . Functional ability and status . Nutritional status . Physical activity . Advanced directives . List of other physicians . Hospitalizations, surgeries, and ER visits in previous 12 months . Vitals . Screenings to include cognitive, depression, and falls . Referrals and appointments  In addition, I have reviewed and discussed with patient certain preventive protocols, quality metrics, and best practice recommendations. A written personalized care plan for preventive services as well as general preventive health recommendations were provided to patient.  Yanet Balliet, Cameron Proud, LPN  05/16/1636   I have reviewed and agree with the above AWV documentation.    [name], FNP

## 2017-01-18 DIAGNOSIS — J301 Allergic rhinitis due to pollen: Secondary | ICD-10-CM | POA: Diagnosis not present

## 2017-01-18 DIAGNOSIS — J3081 Allergic rhinitis due to animal (cat) (dog) hair and dander: Secondary | ICD-10-CM | POA: Diagnosis not present

## 2017-01-18 DIAGNOSIS — J3089 Other allergic rhinitis: Secondary | ICD-10-CM | POA: Diagnosis not present

## 2017-01-21 ENCOUNTER — Ambulatory Visit (INDEPENDENT_AMBULATORY_CARE_PROVIDER_SITE_OTHER): Payer: Medicare Other | Admitting: Family

## 2017-01-21 ENCOUNTER — Encounter: Payer: Self-pay | Admitting: Family

## 2017-01-21 VITALS — BP 136/79 | HR 69 | Temp 97.2°F | Ht 66.5 in | Wt 169.0 lb

## 2017-01-21 DIAGNOSIS — N814 Uterovaginal prolapse, unspecified: Secondary | ICD-10-CM | POA: Diagnosis not present

## 2017-01-21 NOTE — Progress Notes (Signed)
   Subjective:    Patient ID: Cheryl Perry, female    DOB: 1945/11/25, 73 y.o.   MRN: [id number]  HPI Pt presents to the office today for pelvic exam. Pt states she believes her Uterus is hanging from her vaginal. PT states she has been trying to do kegel with no success.   Pt states she can "push it back", but any movements or coughing will cause it to budge out.    Review of Systems  All other systems reviewed and are negative.      Objective:   Physical Exam  Constitutional: She is oriented to person, place, and time. She appears well-developed and well-nourished. No distress.  HENT:  Head: Normocephalic.  Eyes: Pupils are equal, round, and reactive to light.  Neck: Normal range of motion. Neck supple. No thyromegaly present.  Cardiovascular: Normal rate, regular rhythm, normal heart sounds and intact distal pulses.  No murmur heard. Pulmonary/Chest: Effort normal and breath sounds normal. No respiratory distress. She has no wheezes.  Abdominal: Soft. Bowel sounds are normal. She exhibits no distension. There is no tenderness.  Genitourinary: No foreign body in the vagina. No vaginal discharge found.  Genitourinary Comments: Grade 2 cystocele   Musculoskeletal: Normal range of motion. She exhibits no edema or tenderness.  Neurological: She is alert and oriented to person, place, and time. She has normal reflexes. No cranial nerve deficit.  Skin: Skin is warm and dry.  Psychiatric: She has a normal mood and affect. Her behavior is normal. Judgment and thought content normal.  Vitals reviewed.    BP 136/79   Pulse 69   Temp (!) 97.2 F (36.2 C) (Oral)   Ht 5' 6.5" (1.689 m)   Wt 169 lb (76.7 kg)   BMI 26.87 kg/m      Assessment & Plan:  1. Cystocele with prolapse Referral to [place], but pt needs pessary  Encourage frequent bladder emptying  Avoid constipation RTO prn  - Ambulatory referral to Urogynecology   [name], FNP

## 2017-01-21 NOTE — Patient Instructions (Signed)

## 2017-01-29 DIAGNOSIS — J3089 Other allergic rhinitis: Secondary | ICD-10-CM | POA: Diagnosis not present

## 2017-01-29 DIAGNOSIS — J3081 Allergic rhinitis due to animal (cat) (dog) hair and dander: Secondary | ICD-10-CM | POA: Diagnosis not present

## 2017-01-29 DIAGNOSIS — J301 Allergic rhinitis due to pollen: Secondary | ICD-10-CM | POA: Diagnosis not present

## 2017-02-13 DIAGNOSIS — J3081 Allergic rhinitis due to animal (cat) (dog) hair and dander: Secondary | ICD-10-CM | POA: Diagnosis not present

## 2017-02-13 DIAGNOSIS — R3912 Poor urinary stream: Secondary | ICD-10-CM | POA: Diagnosis not present

## 2017-02-13 DIAGNOSIS — J3089 Other allergic rhinitis: Secondary | ICD-10-CM | POA: Diagnosis not present

## 2017-02-13 DIAGNOSIS — R350 Frequency of micturition: Secondary | ICD-10-CM | POA: Diagnosis not present

## 2017-02-13 DIAGNOSIS — N819 Female genital prolapse, unspecified: Secondary | ICD-10-CM | POA: Diagnosis not present

## 2017-02-13 DIAGNOSIS — J301 Allergic rhinitis due to pollen: Secondary | ICD-10-CM | POA: Diagnosis not present

## 2017-02-25 DIAGNOSIS — J3081 Allergic rhinitis due to animal (cat) (dog) hair and dander: Secondary | ICD-10-CM | POA: Diagnosis not present

## 2017-02-25 DIAGNOSIS — J3089 Other allergic rhinitis: Secondary | ICD-10-CM | POA: Diagnosis not present

## 2017-02-25 DIAGNOSIS — J301 Allergic rhinitis due to pollen: Secondary | ICD-10-CM | POA: Diagnosis not present

## 2017-03-06 DIAGNOSIS — J3089 Other allergic rhinitis: Secondary | ICD-10-CM | POA: Diagnosis not present

## 2017-03-06 DIAGNOSIS — J301 Allergic rhinitis due to pollen: Secondary | ICD-10-CM | POA: Diagnosis not present

## 2017-03-06 DIAGNOSIS — J3081 Allergic rhinitis due to animal (cat) (dog) hair and dander: Secondary | ICD-10-CM | POA: Diagnosis not present

## 2017-03-14 DIAGNOSIS — J3089 Other allergic rhinitis: Secondary | ICD-10-CM | POA: Diagnosis not present

## 2017-03-14 DIAGNOSIS — J301 Allergic rhinitis due to pollen: Secondary | ICD-10-CM | POA: Diagnosis not present

## 2017-03-18 DIAGNOSIS — J3081 Allergic rhinitis due to animal (cat) (dog) hair and dander: Secondary | ICD-10-CM | POA: Diagnosis not present

## 2017-03-18 DIAGNOSIS — J301 Allergic rhinitis due to pollen: Secondary | ICD-10-CM | POA: Diagnosis not present

## 2017-03-18 DIAGNOSIS — J3089 Other allergic rhinitis: Secondary | ICD-10-CM | POA: Diagnosis not present

## 2017-03-19 DIAGNOSIS — N819 Female genital prolapse, unspecified: Secondary | ICD-10-CM | POA: Diagnosis not present

## 2017-03-19 DIAGNOSIS — N3946 Mixed incontinence: Secondary | ICD-10-CM | POA: Diagnosis not present

## 2017-03-19 DIAGNOSIS — R350 Frequency of micturition: Secondary | ICD-10-CM | POA: Diagnosis not present

## 2017-03-21 DIAGNOSIS — J3081 Allergic rhinitis due to animal (cat) (dog) hair and dander: Secondary | ICD-10-CM | POA: Diagnosis not present

## 2017-03-21 DIAGNOSIS — J301 Allergic rhinitis due to pollen: Secondary | ICD-10-CM | POA: Diagnosis not present

## 2017-03-21 DIAGNOSIS — J3089 Other allergic rhinitis: Secondary | ICD-10-CM | POA: Diagnosis not present

## 2017-04-01 DIAGNOSIS — J3089 Other allergic rhinitis: Secondary | ICD-10-CM | POA: Diagnosis not present

## 2017-04-01 DIAGNOSIS — J301 Allergic rhinitis due to pollen: Secondary | ICD-10-CM | POA: Diagnosis not present

## 2017-04-01 DIAGNOSIS — J3081 Allergic rhinitis due to animal (cat) (dog) hair and dander: Secondary | ICD-10-CM | POA: Diagnosis not present

## 2017-04-09 ENCOUNTER — Encounter: Payer: Self-pay | Admitting: Internal Medicine

## 2017-04-10 DIAGNOSIS — J301 Allergic rhinitis due to pollen: Secondary | ICD-10-CM | POA: Diagnosis not present

## 2017-04-10 DIAGNOSIS — J3081 Allergic rhinitis due to animal (cat) (dog) hair and dander: Secondary | ICD-10-CM | POA: Diagnosis not present

## 2017-04-10 DIAGNOSIS — J3089 Other allergic rhinitis: Secondary | ICD-10-CM | POA: Diagnosis not present

## 2017-04-10 DIAGNOSIS — J4540 Moderate persistent asthma, uncomplicated: Secondary | ICD-10-CM | POA: Diagnosis not present

## 2017-04-16 DIAGNOSIS — J301 Allergic rhinitis due to pollen: Secondary | ICD-10-CM | POA: Diagnosis not present

## 2017-04-16 DIAGNOSIS — J3081 Allergic rhinitis due to animal (cat) (dog) hair and dander: Secondary | ICD-10-CM | POA: Diagnosis not present

## 2017-04-16 DIAGNOSIS — J3089 Other allergic rhinitis: Secondary | ICD-10-CM | POA: Diagnosis not present

## 2017-04-22 DIAGNOSIS — N814 Uterovaginal prolapse, unspecified: Secondary | ICD-10-CM | POA: Diagnosis not present

## 2017-04-25 DIAGNOSIS — J3089 Other allergic rhinitis: Secondary | ICD-10-CM | POA: Diagnosis not present

## 2017-04-25 DIAGNOSIS — J301 Allergic rhinitis due to pollen: Secondary | ICD-10-CM | POA: Diagnosis not present

## 2017-04-25 DIAGNOSIS — J3081 Allergic rhinitis due to animal (cat) (dog) hair and dander: Secondary | ICD-10-CM | POA: Diagnosis not present

## 2017-04-29 DIAGNOSIS — J301 Allergic rhinitis due to pollen: Secondary | ICD-10-CM | POA: Diagnosis not present

## 2017-04-29 DIAGNOSIS — J3089 Other allergic rhinitis: Secondary | ICD-10-CM | POA: Diagnosis not present

## 2017-04-29 DIAGNOSIS — J3081 Allergic rhinitis due to animal (cat) (dog) hair and dander: Secondary | ICD-10-CM | POA: Diagnosis not present

## 2017-05-06 DIAGNOSIS — J3089 Other allergic rhinitis: Secondary | ICD-10-CM | POA: Diagnosis not present

## 2017-05-06 DIAGNOSIS — J301 Allergic rhinitis due to pollen: Secondary | ICD-10-CM | POA: Diagnosis not present

## 2017-05-06 DIAGNOSIS — J3081 Allergic rhinitis due to animal (cat) (dog) hair and dander: Secondary | ICD-10-CM | POA: Diagnosis not present

## 2017-05-13 DIAGNOSIS — J301 Allergic rhinitis due to pollen: Secondary | ICD-10-CM | POA: Diagnosis not present

## 2017-05-13 DIAGNOSIS — J3081 Allergic rhinitis due to animal (cat) (dog) hair and dander: Secondary | ICD-10-CM | POA: Diagnosis not present

## 2017-05-13 DIAGNOSIS — J3089 Other allergic rhinitis: Secondary | ICD-10-CM | POA: Diagnosis not present

## 2017-05-21 DIAGNOSIS — J301 Allergic rhinitis due to pollen: Secondary | ICD-10-CM | POA: Diagnosis not present

## 2017-05-21 DIAGNOSIS — J3089 Other allergic rhinitis: Secondary | ICD-10-CM | POA: Diagnosis not present

## 2017-05-21 DIAGNOSIS — J3081 Allergic rhinitis due to animal (cat) (dog) hair and dander: Secondary | ICD-10-CM | POA: Diagnosis not present

## 2017-05-24 ENCOUNTER — Encounter: Payer: Self-pay | Admitting: *Deleted

## 2017-05-27 DIAGNOSIS — J3089 Other allergic rhinitis: Secondary | ICD-10-CM | POA: Diagnosis not present

## 2017-05-27 DIAGNOSIS — J3081 Allergic rhinitis due to animal (cat) (dog) hair and dander: Secondary | ICD-10-CM | POA: Diagnosis not present

## 2017-05-27 DIAGNOSIS — J301 Allergic rhinitis due to pollen: Secondary | ICD-10-CM | POA: Diagnosis not present

## 2017-06-10 DIAGNOSIS — J3089 Other allergic rhinitis: Secondary | ICD-10-CM | POA: Diagnosis not present

## 2017-06-10 DIAGNOSIS — J3081 Allergic rhinitis due to animal (cat) (dog) hair and dander: Secondary | ICD-10-CM | POA: Diagnosis not present

## 2017-06-10 DIAGNOSIS — J301 Allergic rhinitis due to pollen: Secondary | ICD-10-CM | POA: Diagnosis not present

## 2017-06-12 ENCOUNTER — Encounter: Payer: Self-pay | Admitting: Internal Medicine

## 2017-06-12 ENCOUNTER — Ambulatory Visit (INDEPENDENT_AMBULATORY_CARE_PROVIDER_SITE_OTHER): Payer: Medicare Other | Admitting: Gastroenterology

## 2017-06-12 ENCOUNTER — Encounter: Payer: Self-pay | Admitting: Gastroenterology

## 2017-06-12 VITALS — BP 128/81 | HR 57 | Temp 98.0°F | Ht 67.0 in | Wt 171.0 lb

## 2017-06-12 DIAGNOSIS — K219 Gastro-esophageal reflux disease without esophagitis: Secondary | ICD-10-CM

## 2017-06-12 MED ORDER — PANTOPRAZOLE SODIUM 40 MG PO TBEC: 40.0000 mg | DELAYED_RELEASE_TABLET | Freq: Every day | ORAL | 3 refills | Status: DC

## 2017-06-12 NOTE — Progress Notes (Signed)
Primary Care Physician: Sharion Balloon, FNP  Primary Gastroenterologist:  Garfield Cornea, MD   Chief Complaint  Patient presents with  . Gastroesophageal Reflux    HPI: Cheryl Perry is a 72 y.o. female here for one-year follow-up.  She has a history of upper GI bleed in early 2017 secondary to multiple NSAID induced gastric ulcers.  Follow-up EGD in May 2017 showed complete healing of the ulcers.  She reports being due for her next colonoscopy in 2020 with history of polyps and hemorrhoids.  When we saw her last year she wanted to come off of PPI therapy and use ranitidine 150 mg 1-2 times daily.  She was advised to continue all NSAIDs.  She states that her reflux is poorly controlled on ranitidine twice daily.  She takes Tums as well but continues to have reflux.  Typical heartburn.  Denies dysphagia, vomiting.  Appetite is good.  No abdominal pain.  No melena or rectal bleeding.  Recalls that she was told she had LPR by ENT in the past.  Took a long time on PPI for her symptoms to resolve.  She is dealing with back pain and pelvic floor prolapse.  She has been fitted for pessary and follows up with her GYN in the near future.  Having less issues with loose stools since wearing a pessary.  Sometimes stools a little hard.  She recalls she is due for a colonoscopy in early 2020 would like to schedule this in January.  She wonders if she should have an upper endoscopy at the same time.  Discussed with patient that clinically she needs to have an indication for it but we can reevaluate her at time of her next appointment.     Current Outpatient Medications  Medication Sig Dispense Refill  . albuterol (PROVENTIL HFA) 108 (90 BASE) MCG/ACT inhaler Inhale 2 puffs into the lungs every 6 (six) hours as needed for wheezing. May use only 3 times a month    . B Complex Vitamins (VITAMIN B COMPLEX PO) Take 1 tablet by mouth daily.    . Biotin 300 MCG TABS Take 300 mcg by mouth daily.     Marland Kitchen  CALCIUM-MAGNESIUM-ZINC PO Take 1 tablet by mouth daily.    . Cholecalciferol (VITAMIN D) 2000 UNITS CAPS Take 1 capsule by mouth daily.    . fluticasone-salmeterol (ADVAIR HFA) 45-21 MCG/ACT inhaler Inhale 2 puffs into the lungs 2 (two) times daily. 3 Inhaler 3  . levothyroxine (SYNTHROID, LEVOTHROID) 75 MCG tablet TAKE 1 TABLET (75 MCG TOTAL)  BY MOUTH DAILY BEFORE BREAKFAST. 90 tablet 3  . loratadine (CLARITIN) 10 MG tablet Take 10 mg by mouth daily.    . methocarbamol (ROBAXIN) 500 MG tablet Take 1 tablet (500 mg total) by mouth 3 (three) times daily. (Patient taking differently: Take 500 mg by mouth every 8 (eight) hours as needed for muscle spasms. ) 90 tablet 1  . montelukast (SINGULAIR) 10 MG tablet Take 10 mg by mouth at bedtime.    . niacin 500 MG tablet Take 500 mg by mouth at bedtime.    . Probiotic Product (PROBIOTIC ADVANCED PO) Take 1 capsule by mouth daily.     . ranitidine (ZANTAC) 150 MG capsule Take 1 capsule (150 mg total) by mouth 2 (two) times daily. 180 capsule 3   No current facility-administered medications for this visit.     Allergies as of 06/12/2017 - Review Complete 06/12/2017  Allergen Reaction Noted  .  Naproxen Hives and Itching 09/02/2013  . Ampicillin  06/05/2012  . Avelox [moxifloxacin hcl in nacl]  06/05/2012  . Bactrim [sulfamethoxazole-trimethoprim]  06/05/2012  . Levaquin [levofloxacin in d5w]  06/05/2012  . Lipitor [atorvastatin]  06/05/2012  . Macrodantin [nitrofurantoin macrocrystal]  01/16/1985  . Premarin [conjugated estrogens]  06/05/2012  . Trental [pentoxifylline er]  06/05/2012  . Livalo [pitavastatin] Other (See Comments) 01/06/2016    ROS:  General: Negative for anorexia, weight loss, fever, chills, fatigue, weakness. ENT: Negative for hoarseness, difficulty swallowing , nasal congestion. CV: Negative for chest pain, angina, palpitations, dyspnea on exertion, peripheral edema.  Respiratory: Negative for dyspnea at rest, dyspnea on  exertion, cough, sputum, wheezing.  GI: See history of present illness. GU:  Negative for dysuria, hematuria, urinary incontinence, urinary frequency, nocturnal urination.  Endo: Negative for unusual weight change.    Physical Examination:   BP 128/81   Pulse (!) 57   Temp 98 F (36.7 C) (Oral)   Ht 5\' 7"  (1.702 m)   Wt 171 lb (77.6 kg)   BMI 26.78 kg/m   General: Well-nourished, well-developed in no acute distress.  Eyes: No icterus. Mouth: Oropharyngeal mucosa moist and pink , no lesions erythema or exudate. Lungs: Clear to auscultation bilaterally.  Heart: Regular rate and rhythm, no murmurs rubs or gallops.  Abdomen: Bowel sounds are normal, nontender, nondistended, no hepatosplenomegaly or masses, no abdominal bruits or hernia , no rebound or guarding.   Extremities: No lower extremity edema. No clubbing or deformities. Neuro: Alert and oriented x 4   Skin: Warm and dry, no jaundice.   Psych: Alert and cooperative, normal mood and affect.  Lab Results  Component Value Date   CREATININE 0.77 12/14/2016   BUN 16 12/14/2016   NA 138 12/14/2016   K 4.6 12/14/2016   CL 102 12/14/2016   CO2 25 12/14/2016   Lab Results  Component Value Date   ALT 15 12/14/2016   AST 21 12/14/2016   ALKPHOS 74 12/14/2016   BILITOT 0.3 12/14/2016   Lab Results  Component Value Date   WBC 5.6 12/14/2016   HGB 14.5 12/14/2016   HCT 44.0 12/14/2016   MCV 98 (H) 12/14/2016   PLT 247 12/14/2016   Lab Results  Component Value Date   TSH 4.310 12/14/2016

## 2017-06-12 NOTE — Progress Notes (Signed)
cc'ed to pcp °

## 2017-06-12 NOTE — Assessment & Plan Note (Signed)
72 year old female presenting for follow-up of GERD.  Reflux is poorly controlled on ranitidine and over-the-counter antacids.  She would like to go back on Protonix.  To needs to avoid NSAIDs given history of peptic ulcer disease.  We will restart Protonix 40 mg daily.  90-day supply with refills provided/sent to her mail order pharmacy.  We will plan to have her come back in December of this year for follow-up of GERD and to make arrangements for surveillance colonoscopy.  As discussed with patient, would pursue upper endoscopy only if indicated based on her symptomology at the time.

## 2017-06-12 NOTE — Patient Instructions (Signed)
1. Stop ranitidine. Start pantoprazole once daily before breakfast. Call is ongoing reflux symptoms.  2. See you back in 12/2017. We will schedule colonoscopy at that time and evaluate your reflux.

## 2017-06-14 ENCOUNTER — Ambulatory Visit: Payer: Medicare Other | Admitting: Family

## 2017-06-14 DIAGNOSIS — M5137 Other intervertebral disc degeneration, lumbosacral region: Secondary | ICD-10-CM | POA: Diagnosis not present

## 2017-06-17 ENCOUNTER — Encounter: Payer: Self-pay | Admitting: Family

## 2017-06-17 ENCOUNTER — Ambulatory Visit (INDEPENDENT_AMBULATORY_CARE_PROVIDER_SITE_OTHER): Payer: Medicare Other | Admitting: Family

## 2017-06-17 VITALS — BP 126/78 | HR 52 | Temp 97.4°F | Ht 67.0 in | Wt 171.4 lb

## 2017-06-17 DIAGNOSIS — I499 Cardiac arrhythmia, unspecified: Secondary | ICD-10-CM | POA: Diagnosis not present

## 2017-06-17 DIAGNOSIS — E038 Other specified hypothyroidism: Secondary | ICD-10-CM | POA: Diagnosis not present

## 2017-06-17 DIAGNOSIS — Z8249 Family history of ischemic heart disease and other diseases of the circulatory system: Secondary | ICD-10-CM

## 2017-06-17 DIAGNOSIS — E559 Vitamin D deficiency, unspecified: Secondary | ICD-10-CM | POA: Diagnosis not present

## 2017-06-17 DIAGNOSIS — J45909 Unspecified asthma, uncomplicated: Secondary | ICD-10-CM | POA: Diagnosis not present

## 2017-06-17 DIAGNOSIS — M545 Low back pain: Secondary | ICD-10-CM | POA: Diagnosis not present

## 2017-06-17 DIAGNOSIS — E785 Hyperlipidemia, unspecified: Secondary | ICD-10-CM

## 2017-06-17 DIAGNOSIS — G8929 Other chronic pain: Secondary | ICD-10-CM

## 2017-06-17 DIAGNOSIS — K219 Gastro-esophageal reflux disease without esophagitis: Secondary | ICD-10-CM | POA: Diagnosis not present

## 2017-06-17 DIAGNOSIS — E663 Overweight: Secondary | ICD-10-CM | POA: Diagnosis not present

## 2017-06-17 NOTE — Progress Notes (Signed)
Subjective:    Patient ID: Cheryl Perry, female    DOB: 1945-05-05, 72 y.o.   MRN: [id number]  Chief Complaint  Patient presents with  . Hypothyroidism    six month recheck  . Gastroesophageal Reflux  . Asthma  . Medical Management of Chronic Issues   Pt presents to the office today for chronic follow up.Pt had a acute GI bleed in January2017, and is been followed by [facility]. Has colonoscopy in January 2020. Pt is followed by allergen specialists annually for her asthma. PT is followed by Ortho every 6 months for injections. .  Asthma  She complains of cough, hoarse voice and wheezing. There is no difficulty breathing. This is a chronic problem. The problem occurs intermittently. The problem has been waxing and waning. Associated symptoms include heartburn. Pertinent negatives include no ear congestion, ear pain or sore throat. Her past medical history is significant for asthma.  Back Pain  This is a chronic problem. The current episode started more than 1 year ago. The problem occurs intermittently. The problem has been waxing and waning since onset. The pain is present in the lumbar spine. The pain is at a severity of 2/10. The pain is moderate. Pertinent negatives include no bladder incontinence or bowel incontinence. Risk factors include obesity and sedentary lifestyle. She has tried bed rest, analgesics, muscle relaxant and NSAIDs for the symptoms. The treatment provided mild relief.  Gastroesophageal Reflux  She complains of belching, coughing, heartburn, a hoarse voice and wheezing. She reports no sore throat. This is a chronic problem. The current episode started more than 1 year ago. The problem occurs occasionally. The problem has been waxing and waning. The symptoms are aggravated by lying down and certain foods. Associated symptoms include fatigue. She has tried a PPI for the symptoms. The treatment provided moderate relief.  Thyroid Problem  Presents for follow-up visit.  Symptoms include fatigue and hoarse voice. Patient reports no constipation or diarrhea. The symptoms have been stable. Her past medical history is significant for hyperlipidemia.  Hyperlipidemia  This is a chronic problem. The current episode started more than 1 year ago. The problem is uncontrolled. Recent lipid tests were reviewed and are high. The current treatment provides no improvement of lipids. Risk factors for coronary artery disease include dyslipidemia, hypertension and a sedentary lifestyle.      Review of Systems  Constitutional: Positive for fatigue.  HENT: Positive for hoarse voice. Negative for ear pain and sore throat.   Respiratory: Positive for cough and wheezing.   Gastrointestinal: Positive for heartburn. Negative for bowel incontinence, constipation and diarrhea.  Genitourinary: Negative for bladder incontinence.  Musculoskeletal: Positive for back pain.  All other systems reviewed and are negative.      Objective:   Physical Exam  Constitutional: She is oriented to person, place, and time. She appears well-developed and well-nourished. No distress.  HENT:  Head: Normocephalic.  Right Ear: External ear normal.  Left Ear: External ear normal.  Mouth/Throat: Oropharynx is clear and moist.  Eyes: Pupils are equal, round, and reactive to light.  Neck: Normal range of motion. Neck supple. No thyromegaly present.  Cardiovascular: Normal rate, regular rhythm, normal heart sounds and intact distal pulses.  No murmur heard. Pulmonary/Chest: Effort normal and breath sounds normal. No respiratory distress. She has no wheezes.  Abdominal: Soft. Bowel sounds are normal. She exhibits no distension. There is no tenderness.  Musculoskeletal: Normal range of motion. She exhibits no edema or tenderness.  Neurological: She is  alert and oriented to person, place, and time. She has normal reflexes. No cranial nerve deficit.  Skin: Skin is warm and dry.  Psychiatric: She has a  normal mood and affect. Her behavior is normal. Judgment and thought content normal.  Vitals reviewed.    BP 126/78   Pulse (!) 52   Temp (!) 97.4 F (36.3 C) (Oral)   Ht 5' 7"  (1.702 m)   Wt 171 lb 6.4 oz (77.7 kg)   BMI 26.85 kg/m      Assessment & Plan:  Cheryl Perry comes in today with chief complaint of Hypothyroidism (six month recheck); Gastroesophageal Reflux; Asthma; and Medical Management of Chronic Issues   Diagnosis and orders addressed:  1. Irregular heart beat - EKG 12-Lead - CMP14+EGFR - CBC with Differential/Platelet - Ambulatory referral to Cardiology  2. Uncomplicated asthma, unspecified asthma severity, unspecified whether persistent - CMP14+EGFR - CBC with Differential/Platelet  3. Gastroesophageal reflux disease, esophagitis presence not specified - CMP14+EGFR - CBC with Differential/Platelet  4. Other specified hypothyroidism - CMP14+EGFR - CBC with Differential/Platelet - TSH  5. Hyperlipidemia, unspecified hyperlipidemia type  - CMP14+EGFR - CBC with Differential/Platelet - Lipid panel - Ambulatory referral to Cardiology  6. Overweight (BMI 25.0-29.9) - CMP14+EGFR - CBC with Differential/Platelet  7. Vitamin D deficiency  - CMP14+EGFR - CBC with Differential/Platelet - VITAMIN D 25 Hydroxy (Vit-D Deficiency, Fractures)  8. Chronic bilateral low back pain without sciatica - CMP14+EGFR - CBC with Differential/Platelet  9. Family history of cardiac disorder - Ambulatory referral to Cardiology   Labs pending Health Maintenance reviewed Diet and exercise encouraged  Follow up plan: 6 months    [name], FNP

## 2017-06-17 NOTE — Patient Instructions (Signed)

## 2017-06-18 ENCOUNTER — Other Ambulatory Visit: Payer: Self-pay | Admitting: Family

## 2017-06-18 LAB — CMP14+EGFR
A/G RATIO: 1.9 (ref 1.2–2.2)
ALK PHOS: 66 IU/L (ref 39–117)
ALT: 17 IU/L (ref 0–32)
AST: 22 IU/L (ref 0–40)
Albumin: 4.1 g/dL (ref 3.5–4.8)
BUN/Creatinine Ratio: 16 (ref 12–28)
BUN: 12 mg/dL (ref 8–27)
CHLORIDE: 101 mmol/L (ref 96–106)
CO2: 22 mmol/L (ref 20–29)
Calcium: 9.3 mg/dL (ref 8.7–10.3)
Creatinine, Ser: 0.76 mg/dL (ref 0.57–1.00)
GFR calc Af Amer: 91 mL/min/{1.73_m2} (ref >59)
GFR calc non Af Amer: 79 mL/min/{1.73_m2} (ref >59)
GLOBULIN, TOTAL: 2.2 g/dL (ref 1.5–4.5)
Glucose: 34 mg/dL — CL (ref 65–99)
POTASSIUM: 4.7 mmol/L (ref 3.5–5.2)
SODIUM: 142 mmol/L (ref 134–144)
Total Protein: 6.3 g/dL (ref 6.0–8.5)

## 2017-06-18 LAB — LIPID PANEL
CHOL/HDL RATIO: 3.5 ratio (ref 0.0–4.4)
Cholesterol, Total: 279 mg/dL — ABNORMAL HIGH (ref 100–199)
HDL: 79 mg/dL (ref >39)
LDL Calculated: 168 mg/dL — ABNORMAL HIGH (ref 0–99)
Triglycerides: 158 mg/dL — ABNORMAL HIGH (ref 0–149)
VLDL Cholesterol Cal: 32 mg/dL (ref 5–40)

## 2017-06-18 LAB — CBC WITH DIFFERENTIAL/PLATELET
BASOS ABS: 0 10*3/uL (ref 0.0–0.2)
Basos: 1 %
EOS (ABSOLUTE): 0.4 10*3/uL (ref 0.0–0.4)
Eos: 6 %
Hematocrit: 43.2 % (ref 34.0–46.6)
Hemoglobin: 14.5 g/dL (ref 11.1–15.9)
IMMATURE GRANS (ABS): 0 10*3/uL (ref 0.0–0.1)
IMMATURE GRANULOCYTES: 0 %
LYMPHS: 31 %
Lymphocytes Absolute: 2.1 10*3/uL (ref 0.7–3.1)
MCH: 31.9 pg (ref 26.6–33.0)
MCHC: 33.6 g/dL (ref 31.5–35.7)
MCV: 95 fL (ref 79–97)
MONOS ABS: 0.7 10*3/uL (ref 0.1–0.9)
Monocytes: 10 %
NEUTROS PCT: 52 %
Neutrophils Absolute: 3.4 10*3/uL (ref 1.4–7.0)
PLATELETS: 237 10*3/uL (ref 150–450)
RBC: 4.54 x10E6/uL (ref 3.77–5.28)
RDW: 13.1 % (ref 12.3–15.4)
WBC: 6.6 10*3/uL (ref 3.4–10.8)

## 2017-06-18 LAB — VITAMIN D 25 HYDROXY (VIT D DEFICIENCY, FRACTURES): Vit D, 25-Hydroxy: 41.7 ng/mL (ref 30.0–100.0)

## 2017-06-18 LAB — TSH: TSH: 4.34 u[IU]/mL (ref 0.450–4.500)

## 2017-06-19 ENCOUNTER — Other Ambulatory Visit: Payer: Self-pay | Admitting: *Deleted

## 2017-06-19 ENCOUNTER — Other Ambulatory Visit: Payer: Medicare Other

## 2017-06-19 DIAGNOSIS — E162 Hypoglycemia, unspecified: Secondary | ICD-10-CM

## 2017-06-19 LAB — GLUCOSE HEMOCUE WAIVED: GLU HEMOCUE WAIVED: 68 mg/dL (ref 65–99)

## 2017-06-19 LAB — BAYER DCA HB A1C WAIVED: HB A1C: 5.5 % (ref <7.0)

## 2017-06-21 DIAGNOSIS — J301 Allergic rhinitis due to pollen: Secondary | ICD-10-CM | POA: Diagnosis not present

## 2017-06-21 DIAGNOSIS — J3089 Other allergic rhinitis: Secondary | ICD-10-CM | POA: Diagnosis not present

## 2017-06-21 DIAGNOSIS — J3081 Allergic rhinitis due to animal (cat) (dog) hair and dander: Secondary | ICD-10-CM | POA: Diagnosis not present

## 2017-06-28 DIAGNOSIS — J3081 Allergic rhinitis due to animal (cat) (dog) hair and dander: Secondary | ICD-10-CM | POA: Diagnosis not present

## 2017-06-28 DIAGNOSIS — J3089 Other allergic rhinitis: Secondary | ICD-10-CM | POA: Diagnosis not present

## 2017-06-28 DIAGNOSIS — J301 Allergic rhinitis due to pollen: Secondary | ICD-10-CM | POA: Diagnosis not present

## 2017-07-01 DIAGNOSIS — Z1231 Encounter for screening mammogram for malignant neoplasm of breast: Secondary | ICD-10-CM | POA: Diagnosis not present

## 2017-07-04 DIAGNOSIS — N6311 Unspecified lump in the right breast, upper outer quadrant: Secondary | ICD-10-CM | POA: Diagnosis not present

## 2017-07-05 LAB — HM MAMMOGRAPHY

## 2017-07-08 ENCOUNTER — Other Ambulatory Visit: Payer: Self-pay | Admitting: Radiology

## 2017-07-08 DIAGNOSIS — C50411 Malignant neoplasm of upper-outer quadrant of right female breast: Secondary | ICD-10-CM | POA: Diagnosis not present

## 2017-07-08 DIAGNOSIS — N6311 Unspecified lump in the right breast, upper outer quadrant: Secondary | ICD-10-CM | POA: Diagnosis not present

## 2017-07-08 DIAGNOSIS — C801 Malignant (primary) neoplasm, unspecified: Secondary | ICD-10-CM

## 2017-07-08 DIAGNOSIS — D0591 Unspecified type of carcinoma in situ of right breast: Secondary | ICD-10-CM | POA: Diagnosis not present

## 2017-07-08 HISTORY — DX: Malignant (primary) neoplasm, unspecified: C80.1

## 2017-07-09 ENCOUNTER — Ambulatory Visit (INDEPENDENT_AMBULATORY_CARE_PROVIDER_SITE_OTHER): Payer: Medicare Other | Admitting: Cardiology

## 2017-07-09 ENCOUNTER — Other Ambulatory Visit: Payer: Self-pay

## 2017-07-09 ENCOUNTER — Encounter: Payer: Self-pay | Admitting: Cardiology

## 2017-07-09 VITALS — BP 136/85 | HR 55 | Ht 66.0 in | Wt 173.0 lb

## 2017-07-09 DIAGNOSIS — E782 Mixed hyperlipidemia: Secondary | ICD-10-CM | POA: Diagnosis not present

## 2017-07-09 DIAGNOSIS — R9431 Abnormal electrocardiogram [ECG] [EKG]: Secondary | ICD-10-CM | POA: Diagnosis not present

## 2017-07-09 MED ORDER — ROSUVASTATIN CALCIUM 5 MG PO TABS: ORAL_TABLET | ORAL | 0 refills | Status: DC

## 2017-07-09 NOTE — Patient Instructions (Signed)
Your physician recommends that you schedule a follow-up appointment PENDING TEST RESULTS   Your physician has recommended you make the following change in your medication:   START CRESTOR 5 MG ONCE WEEKLY  CALL us IN 1 MONTH WITH AN UPDATE ON HOW YOU ARE TOLERATING Jay   Your physician has requested that you have an echocardiogram. Echocardiography is a painless test that uses sound waves to create images of your heart. It provides your doctor with information about the size and shape of your heart and how well your heart's chambers and valves are working. This procedure takes approximately one hour. There are no restrictions for this procedure.  Thank you for choosing [facility]!!

## 2017-07-09 NOTE — Progress Notes (Signed)
Clinical Summary Cheryl Perry is a 72 y.o.female seen as new consult, referred by NP [facility] for abnormal EKG.  1. Abnormal EKG -EKG by pcp individually reviewed, SR with anteroseptal Qwaves - patient with no known cardiac history.  - denies any chest pain, no SOB or DOE recently. Can have some SOB with her asthma.  - walks regularly at home up to 30 minutes without troubles.   CAD risk factors: mild remote tobacco, age, family history sister with CAD.    2. Asthma - per pcp  3. Hyperlipidemia - 06/2017 TC 279 TG 158 HLD 79 LDL 168 - reportedly intolerant to statins. (lipitor, pitavastatin listed as allergies).      Past Medical History:  Diagnosis Date  . Allergy   . Arthritis    back pain   . Asthma   . Back pain   . GERD (gastroesophageal reflux disease)   . Hiatal hernia   . Hyperlipidemia   . Osteopenia   . Thyroid disease   . Vasculopathy    lymphatic     Allergies  Allergen Reactions  . Naproxen Hives and Itching    Note - also has Ulcer - cant take   . Ampicillin     rash  . Avelox [Moxifloxacin Hcl In Nacl]     Rash   . Bactrim [Sulfamethoxazole-Trimethoprim]     Rash   . Levaquin [Levofloxacin In D5w]     Rash   . Lipitor [Atorvastatin]     mylagias  . Macrodantin [Nitrofurantoin Macrocrystal]     Rash HIVES   . Premarin [Conjugated Estrogens]     Itching tightness in chest  . Trental [Pentoxifylline Er]     Rash   . Livalo [Pitavastatin] Other (See Comments)    myalgias     Current Outpatient Medications  Medication Sig Dispense Refill  . albuterol (PROVENTIL HFA) 108 (90 BASE) MCG/ACT inhaler Inhale 2 puffs into the lungs every 6 (six) hours as needed for wheezing. May use only 3 times a month    . B Complex Vitamins (VITAMIN B COMPLEX PO) Take 1 tablet by mouth daily.    . Biotin 300 MCG TABS Take 300 mcg by mouth daily.     Marland Kitchen CALCIUM-MAGNESIUM-ZINC PO Take 1 tablet by mouth daily.    . Cholecalciferol (VITAMIN D) 2000 UNITS  CAPS Take 1 capsule by mouth daily.    Marland Kitchen EPINEPHrine 0.3 mg/0.3 mL IJ SOAJ injection See admin instructions.  0  . fluticasone-salmeterol (ADVAIR HFA) 45-21 MCG/ACT inhaler Inhale 2 puffs into the lungs 2 (two) times daily. 3 Inhaler 3  . levothyroxine (SYNTHROID, LEVOTHROID) 75 MCG tablet TAKE 1 TABLET (75 MCG TOTAL)  BY MOUTH DAILY BEFORE BREAKFAST. 90 tablet 3  . loratadine (CLARITIN) 10 MG tablet Take 10 mg by mouth daily.    . methocarbamol (ROBAXIN) 500 MG tablet Take 1 tablet (500 mg total) by mouth 3 (three) times daily. (Patient taking differently: Take 500 mg by mouth every 8 (eight) hours as needed for muscle spasms. ) 90 tablet 1  . montelukast (SINGULAIR) 10 MG tablet Take 10 mg by mouth at bedtime.    . niacin 500 MG tablet Take 500 mg by mouth at bedtime.    . pantoprazole (PROTONIX) 40 MG tablet Take 1 tablet (40 mg total) by mouth daily before breakfast. 90 tablet 3  . Probiotic Product (PROBIOTIC ADVANCED PO) Take 1 capsule by mouth daily.      No current facility-administered medications  for this visit.      Past Surgical History:  Procedure Laterality Date  . COLONOSCOPY    . CYSTOSCOPY    . cystscopy    . ESOPHAGOGASTRODUODENOSCOPY N/A 01/28/2015   Dr.Rourk- 3 small prepyloric/gastric ulcers- likely culprits causing bleeding. hiatal hernia, small gastic polyps not manipulated.  . ESOPHAGOGASTRODUODENOSCOPY N/A 05/26/2015   Dr. Gala Romney: Small hiatal hernia, previous gastric ulcers completely healed.  Marland Kitchen KNEE SURGERY    . TONSILLECTOMY    . TUBAL LIGATION       Allergies  Allergen Reactions  . Naproxen Hives and Itching    Note - also has Ulcer - cant take   . Ampicillin     rash  . Avelox [Moxifloxacin Hcl In Nacl]     Rash   . Bactrim [Sulfamethoxazole-Trimethoprim]     Rash   . Levaquin [Levofloxacin In D5w]     Rash   . Lipitor [Atorvastatin]     mylagias  . Macrodantin [Nitrofurantoin Macrocrystal]     Rash HIVES   . Premarin [Conjugated Estrogens]      Itching tightness in chest  . Trental [Pentoxifylline Er]     Rash   . Livalo [Pitavastatin] Other (See Comments)    myalgias      Family History  Problem Relation Age of Onset  . Stroke Mother   . Hypertension Mother   . Alzheimer's disease Mother   . Heart disease Sister   . Hypertension Sister   . Heart attack Sister        Psychologist, forensic   . Arthritis Daughter        very bad MVA   . Hypertension Son   . Seizures Son      Social History Ms. Strack reports that she quit smoking about 43 years ago. She has never used smokeless tobacco. Ms. Maille reports that she does not drink alcohol.   Review of Systems CONSTITUTIONAL: No weight loss, fever, chills, weakness or fatigue.  HEENT: Eyes: No visual loss, blurred vision, double vision or yellow sclerae.No hearing loss, sneezing, congestion, runny nose or sore throat.  SKIN: No rash or itching.  CARDIOVASCULAR: per hpi RESPIRATORY: No shortness of breath, cough or sputum.  GASTROINTESTINAL: No anorexia, nausea, vomiting or diarrhea. No abdominal pain or blood.  GENITOURINARY: No burning on urination, no polyuria NEUROLOGICAL: No headache, dizziness, syncope, paralysis, ataxia, numbness or tingling in the extremities. No change in bowel or bladder control.  MUSCULOSKELETAL: No muscle, back pain, joint pain or stiffness.  LYMPHATICS: No enlarged nodes. No history of splenectomy.  PSYCHIATRIC: No history of depression or anxiety.  ENDOCRINOLOGIC: No reports of sweating, cold or heat intolerance. No polyuria or polydipsia.  Marland Kitchen   Physical Examination Vitals:   07/09/17 0903  BP: 136/85  Pulse: (!) 55  SpO2: 97%   Vitals:   07/09/17 0903  Weight: 173 lb (78.5 kg)  Height: 5\' 6"  (1.676 m)    Gen: resting comfortably, no acute distress HEENT: no scleral icterus, pupils equal round and reactive, no palptable cervical adenopathy,  CV: RRR, no m/r/g, no jvd Resp: Clear to auscultation bilaterally GI: abdomen is  soft, non-tender, non-distended, normal bowel sounds, no hepatosplenomegaly MSK: extremities are warm, no edema.  Skin: warm, no rash Neuro:  no focal deficits Psych: appropriate affect   Assessment and Plan  1. Abnormal EKG - EKG with anteroseptal Qwaves, no known history of CAD - will obtain echo to evaluate LV function and wall motion  2. Hyperlipidemia -  did not tolerate statins previously. CAD 10 year risk of 12.5% - will try cresto 5mg  just once a week, if tolerated change to every other day dosing    F/u pending echo     Arnoldo Lenis, M.D.,

## 2017-07-10 ENCOUNTER — Encounter: Payer: Self-pay | Admitting: *Deleted

## 2017-07-10 ENCOUNTER — Telehealth: Payer: Self-pay | Admitting: Hematology and Oncology

## 2017-07-10 DIAGNOSIS — J3089 Other allergic rhinitis: Secondary | ICD-10-CM | POA: Diagnosis not present

## 2017-07-10 DIAGNOSIS — J3081 Allergic rhinitis due to animal (cat) (dog) hair and dander: Secondary | ICD-10-CM | POA: Diagnosis not present

## 2017-07-10 DIAGNOSIS — J301 Allergic rhinitis due to pollen: Secondary | ICD-10-CM | POA: Diagnosis not present

## 2017-07-10 NOTE — Telephone Encounter (Signed)
Spoke to patient to confirm morning [facility] appointment on 7/10, [facility] patient, reminder letter sent

## 2017-07-12 ENCOUNTER — Other Ambulatory Visit: Payer: Self-pay | Admitting: *Deleted

## 2017-07-12 DIAGNOSIS — C50411 Malignant neoplasm of upper-outer quadrant of right female breast: Secondary | ICD-10-CM | POA: Insufficient documentation

## 2017-07-12 DIAGNOSIS — Z170 Estrogen receptor positive status [ER+]: Secondary | ICD-10-CM | POA: Insufficient documentation

## 2017-07-15 ENCOUNTER — Encounter: Payer: Self-pay | Admitting: Cardiology

## 2017-07-17 ENCOUNTER — Ambulatory Visit: Payer: Self-pay | Admitting: Surgery

## 2017-07-17 ENCOUNTER — Ambulatory Visit
Admission: RE | Admit: 2017-07-17 | Discharge: 2017-07-17 | Disposition: A | Discharge status: Home / Self Care | Payer: Medicare Other | Source: Ambulatory Visit | Attending: Radiation Oncology | Admitting: Radiation Oncology

## 2017-07-17 ENCOUNTER — Ambulatory Visit: Payer: Medicare Other | Attending: Surgery | Admitting: Physical Therapy

## 2017-07-17 ENCOUNTER — Inpatient Hospital Stay: Payer: Medicare Other | Attending: Hematology and Oncology | Admitting: Hematology and Oncology

## 2017-07-17 ENCOUNTER — Inpatient Hospital Stay: Payer: Medicare Other

## 2017-07-17 ENCOUNTER — Telehealth: Payer: Self-pay | Admitting: Cardiology

## 2017-07-17 ENCOUNTER — Encounter: Payer: Self-pay | Admitting: Physical Therapy

## 2017-07-17 ENCOUNTER — Encounter: Payer: Self-pay | Admitting: Hematology and Oncology

## 2017-07-17 ENCOUNTER — Other Ambulatory Visit: Payer: Self-pay

## 2017-07-17 DIAGNOSIS — M859 Disorder of bone density and structure, unspecified: Secondary | ICD-10-CM | POA: Insufficient documentation

## 2017-07-17 DIAGNOSIS — E079 Disorder of thyroid, unspecified: Secondary | ICD-10-CM | POA: Diagnosis not present

## 2017-07-17 DIAGNOSIS — R293 Abnormal posture: Secondary | ICD-10-CM | POA: Diagnosis not present

## 2017-07-17 DIAGNOSIS — J3089 Other allergic rhinitis: Secondary | ICD-10-CM | POA: Diagnosis not present

## 2017-07-17 DIAGNOSIS — C50411 Malignant neoplasm of upper-outer quadrant of right female breast: Secondary | ICD-10-CM | POA: Diagnosis not present

## 2017-07-17 DIAGNOSIS — Z170 Estrogen receptor positive status [ER+]: Secondary | ICD-10-CM | POA: Insufficient documentation

## 2017-07-17 DIAGNOSIS — Z87891 Personal history of nicotine dependence: Secondary | ICD-10-CM | POA: Insufficient documentation

## 2017-07-17 DIAGNOSIS — C50911 Malignant neoplasm of unspecified site of right female breast: Secondary | ICD-10-CM | POA: Diagnosis not present

## 2017-07-17 DIAGNOSIS — J301 Allergic rhinitis due to pollen: Secondary | ICD-10-CM | POA: Diagnosis not present

## 2017-07-17 DIAGNOSIS — J3081 Allergic rhinitis due to animal (cat) (dog) hair and dander: Secondary | ICD-10-CM | POA: Diagnosis not present

## 2017-07-17 DIAGNOSIS — C50211 Malignant neoplasm of upper-inner quadrant of right female breast: Secondary | ICD-10-CM | POA: Insufficient documentation

## 2017-07-17 LAB — CBC WITH DIFFERENTIAL (CANCER CENTER ONLY)
BASOS ABS: 0.1 10*3/uL (ref 0.0–0.1)
Basophils Relative: 1 %
EOS PCT: 9 %
Eosinophils Absolute: 0.5 10*3/uL (ref 0.0–0.5)
HCT: 44.7 % (ref 34.8–46.6)
Hemoglobin: 14.6 g/dL (ref 11.6–15.9)
LYMPHS PCT: 28 %
Lymphs Abs: 1.7 10*3/uL (ref 0.9–3.3)
MCH: 32.4 pg (ref 25.1–34.0)
MCHC: 32.7 g/dL (ref 31.5–36.0)
MCV: 99.3 fL (ref 79.5–101.0)
MONO ABS: 0.7 10*3/uL (ref 0.1–0.9)
MONOS PCT: 12 %
Neutro Abs: 2.9 10*3/uL (ref 1.5–6.5)
Neutrophils Relative %: 50 %
PLATELETS: 205 10*3/uL (ref 145–400)
RBC: 4.5 MIL/uL (ref 3.70–5.45)
RDW: 13.1 % (ref 11.2–14.5)
WBC Count: 5.8 10*3/uL (ref 3.9–10.3)

## 2017-07-17 LAB — CMP (CANCER CENTER ONLY)
ALT: 21 U/L (ref 0–44)
ANION GAP: 8 (ref 5–15)
AST: 22 U/L (ref 15–41)
Albumin: 4 g/dL (ref 3.5–5.0)
Alkaline Phosphatase: 78 U/L (ref 38–126)
BUN: 13 mg/dL (ref 8–23)
CHLORIDE: 106 mmol/L (ref 98–111)
CO2: 27 mmol/L (ref 22–32)
Calcium: 9.4 mg/dL (ref 8.9–10.3)
Creatinine: 0.85 mg/dL (ref 0.44–1.00)
Glucose, Bld: 94 mg/dL (ref 70–99)
POTASSIUM: 4.1 mmol/L (ref 3.5–5.1)
Sodium: 141 mmol/L (ref 135–145)
Total Bilirubin: 0.4 mg/dL (ref 0.3–1.2)
Total Protein: 6.6 g/dL (ref 6.5–8.1)

## 2017-07-17 NOTE — H&P (Signed)
[name] Documented: 07/17/2017 7:34 AM Location: [facility] Patient #: [id number] DOB: [birth year] Undefined / Language: Cheryl Perry / Race: White Female  History of Present Illness Cheryl Moores A. Asaiah Scarber MD; 07/17/2017 10:22 AM) Patient words: pt sent at the request of Dr Lisbeth Renshaw for abnormal screening mammogram. Pt underwent mammography which showed a 1.2 cm mass UOQ right breast core bx IDC grade 1 -2 ER PR pos her 2 neu negative with DCIS. She denies breast pain, nipple discharge or change in the appearance of her breasts.  The patient is a 72 year old female.   Past Surgical History Cheryl Pummel, RN; 07/17/2017 7:35 AM) Breast Biopsy Right. Colon Polyp Removal - Colonoscopy Knee Surgery Right. Tonsillectomy  Diagnostic Studies History Cheryl Pummel, RN; 07/17/2017 7:35 AM) Colonoscopy 1-5 years ago Mammogram within last year Pap Smear 1-5 years ago  Medication History Cheryl Pummel, RN; 07/17/2017 7:35 AM) Medications Reconciled  Social History Cheryl Pummel, RN; 07/17/2017 7:35 AM) Caffeine use Carbonated beverages, Tea. No alcohol use No drug use Tobacco use Former smoker.  Family History Cheryl Pummel, RN; 07/17/2017 7:35 AM) Alcohol Abuse Family Members In General. Arthritis Family Members In General. Breast Cancer Family Members In General. Cerebrovascular Accident Family Members In General, Mother. Heart Disease Family Members In General. Hypertension Family Members In General, Mother, Sister. Respiratory Condition Family Members In General. Seizure disorder Son.  Pregnancy / Birth History Cheryl Pummel, RN; 07/17/2017 7:35 AM) Age at menarche 30 years. Age of menopause <45 Contraceptive History Oral contraceptives. Gravida 2 Irregular periods Maternal age 48-20 Para 2  Other Problems Cheryl Pummel, RN; 07/17/2017 7:35 AM) Asthma Back Pain Bladder Problems Breast Cancer Gastric Ulcer Gastroesophageal  Reflux Disease General anesthesia - complications Hemorrhoids Home Oxygen Use Hypercholesterolemia Thyroid Disease Transfusion history     Review of Systems Cheryl Spillers Ledford RN; 07/17/2017 7:35 AM) General Not Present- Appetite Loss, Chills, Fatigue, Fever, Night Sweats, Weight Gain and Weight Loss. Skin Not Present- Change in Wart/Mole, Dryness, Hives, Jaundice, New Lesions, Non-Healing Wounds, Rash and Ulcer. HEENT Present- Hearing Loss, Ringing in the Ears, Seasonal Allergies and Wears glasses/contact lenses. Not Present- Earache, Hoarseness, Nose Bleed, Oral Ulcers, Sinus Pain, Sore Throat, Visual Disturbances and Yellow Eyes. Respiratory Present- Snoring. Not Present- Bloody sputum, Chronic Cough, Difficulty Breathing and Wheezing. Breast Not Present- Breast Mass, Breast Pain, Nipple Discharge and Skin Changes. Cardiovascular Present- Rapid Heart Rate. Not Present- Chest Pain, Difficulty Breathing Lying Down, Leg Cramps, Palpitations, Shortness of Breath and Swelling of Extremities. Gastrointestinal Present- Abdominal Pain and Change in Bowel Habits. Not Present- Bloating, Bloody Stool, Chronic diarrhea, Constipation, Difficulty Swallowing, Excessive gas, Gets full quickly at meals, Hemorrhoids, Indigestion, Nausea, Rectal Pain and Vomiting. Female Genitourinary Present- Urgency. Not Present- Frequency, Nocturia, Painful Urination and Pelvic Pain. Musculoskeletal Present- Back Pain. Not Present- Joint Pain, Joint Stiffness, Muscle Pain, Muscle Weakness and Swelling of Extremities. Neurological Present- Numbness and Tingling. Not Present- Decreased Memory, Fainting, Headaches, Seizures, Tremor, Trouble walking and Weakness. Psychiatric Not Present- Anxiety, Bipolar, Change in Sleep Pattern, Depression, Fearful and Frequent crying. Endocrine Present- Hair Changes. Not Present- Cold Intolerance, Excessive Hunger, Heat Intolerance, Hot flashes and New Diabetes. Hematology Not Present-  Blood Thinners, Easy Bruising, Excessive bleeding, Gland problems, HIV and Persistent Infections.   Physical Exam (Cheryl Emami A. Edessa Jakubowicz MD; 07/17/2017 10:23 AM)  General Mental Status-Alert. General Appearance-Consistent with stated age. Hydration-Well hydrated. Voice-Normal.  Head and Neck Head-normocephalic, atraumatic with no lesions or palpable masses. Trachea-midline. Thyroid Gland Characteristics - normal size and consistency.  Eye Eyeball - Bilateral-Extraocular movements intact. Sclera/Conjunctiva - Bilateral-No scleral icterus.  Chest and Lung Exam Chest and lung exam reveals -quiet, even and easy respiratory effort with no use of accessory muscles and on auscultation, normal breath sounds, no adventitious sounds and normal vocal resonance. Inspection Chest Wall - Normal. Back - normal.  Breast Note: swelling right breast no nipple discharge left breast normal  Cardiovascular Cardiovascular examination reveals -normal heart sounds, regular rate and rhythm with no murmurs and normal pedal pulses bilaterally.  Neurologic Neurologic evaluation reveals -alert and oriented x 3 with no impairment of recent or remote memory. Mental Status-Normal.  Musculoskeletal Normal Exam - Left-Upper Extremity Strength Normal and Lower Extremity Strength Normal. Normal Exam - Right-Upper Extremity Strength Normal and Lower Extremity Strength Normal.  Lymphatic Head & Neck  General Head & Neck Lymphatics: Bilateral - Description - Normal. Axillary  General Axillary Region: Bilateral - Description - Normal. Tenderness - Non Tender.    Assessment & Plan (Cheryl Drotar A. Keean Wilmeth MD; 07/17/2017 10:25 AM)  BREAST CANCER, RIGHT (C50.911) Impression: upper outer quadrant ER PR positive   Risk of lumpectomy include bleeding, infection, seroma, more surgery, use of seed/wire, wound care, cosmetic deformity and the need for other treatments, death , blood clots,  death. Pt agrees to proceed. Risk of sentinel lymph node mapping include bleeding, infection, lymphedema, shoulder pain. stiffness, dye allergy. cosmetic deformity , blood clots, death, need for more surgery. Pt agres to proceed. Pt has opted for right breast lumpectomy and right SLN mapping  Current Plans You are being scheduled for surgery- Our schedulers will call you.  You should hear from our office's scheduling department within 5 working days about the location, date, and time of surgery. We try to make accommodations for patient's preferences in scheduling surgery, but sometimes the OR schedule or the surgeon's schedule prevents [place] from making those accommodations.  If you have not heard from our office [phone]) in 5 working days, call the office and ask for your surgeon's nurse.  If you have other questions about your diagnosis, plan, or surgery, call the office and ask for your surgeon's nurse.  Pt Education - [facility] Breast Cancer Information Given - [facility] "Breast Journey" Package We discussed the staging and pathophysiology of breast cancer. We discussed all of the different options for treatment for breast cancer including surgery, chemotherapy, radiation therapy, Herceptin, and antiestrogen therapy. We discussed a sentinel lymph node biopsy as she does not appear to having lymph node involvement right now. We discussed the performance of that with injection of radioactive tracer and blue dye. We discussed that she would have an incision underneath her axillary hairline. We discussed that there is a bout a 10-20% chance of having a positive node with a sentinel lymph node biopsy and we will await the permanent pathology to make any other first further decisions in terms of her treatment. One of these options might be to return to the operating room to perform an axillary lymph node dissection. We discussed about a 1-2% risk lifetime of chronic shoulder pain as well as lymphedema  associated with a sentinel lymph node biopsy. We discussed the options for treatment of the breast cancer which included lumpectomy versus a mastectomy. We discussed the performance of the lumpectomy with a wire placement. We discussed a 10-20% chance of a positive margin requiring reexcision in the operating room. We also discussed that she may need radiation therapy or antiestrogen therapy or both if she undergoes lumpectomy. We discussed the  mastectomy and the postoperative care for that as well. We discussed that there is no difference in her survival whether she undergoes lumpectomy with radiation therapy or antiestrogen therapy versus a mastectomy. There is a slight difference in the local recurrence rate being 3-5% with lumpectomy and about 1% with a mastectomy. We discussed the risks of operation including bleeding, infection, possible reoperation. She understands her further therapy will be based on what her stages at the time of her operation.  Pt Education - breast cancer surgery: discussed with patient and provided information. Pt Education - [facility] Breast Biopsy HCI: discussed with patient and provided information.

## 2017-07-17 NOTE — Therapy (Signed)
[place], [place], [address] Phone: [phone]   Fax:  [phone]  Physical Therapy Evaluation  Patient Details  Name: Cheryl Perry MRN: [id number] Date of Birth: Apr 18, 1945 Referring Provider: Dr. Erroll Luna   Encounter Date: 07/17/2017  PT End of Session - 07/17/17 1103    Visit Number  1    Number of Visits  2    Date for PT Re-Evaluation  09/11/17    PT Start Time  1005    PT Stop Time  1035    PT Time Calculation (min)  30 min    Activity Tolerance  Patient tolerated treatment well    Behavior During Therapy  [facility] for tasks assessed/performed       Past Medical History:  Diagnosis Date  . Allergy   . Arthritis    back pain   . Asthma   . Back pain   . GERD (gastroesophageal reflux disease)   . Hiatal hernia   . Hyperlipidemia   . Osteopenia   . Thyroid disease   . Vasculopathy    lymphatic    Past Surgical History:  Procedure Laterality Date  . COLONOSCOPY    . CYSTOSCOPY    . cystscopy    . ESOPHAGOGASTRODUODENOSCOPY N/A 01/28/2015   Dr.Rourk- 3 small prepyloric/gastric ulcers- likely culprits causing bleeding. hiatal hernia, small gastic polyps not manipulated.  . ESOPHAGOGASTRODUODENOSCOPY N/A 05/26/2015   Dr. Gala Romney: Small hiatal hernia, previous gastric ulcers completely healed.  Marland Kitchen KNEE SURGERY    . TONSILLECTOMY    . TUBAL LIGATION      There were no vitals filed for this visit.   Subjective Assessment - 07/17/17 1047    Subjective  Patient reports she is here today to be seen by her medical team for her newly diagnosed right breast cancer.    Pertinent History  Patient was diagnosed on 07/01/17 with right grade I-II Invasive ductal carcinoma breast cancer. It measures 1.2 cm and is located in the upper inner quadrant. It is ER/PR positive and HER2 negative with a Ki67 of 2%. She has a pelvic prolapse and asthma. She also has chronic back pain.    Patient Stated  Goals  Reduce lymphedema risk and learn post op shoulder ROM HEP    Currently in Pain?  No/denies Has chronic back pain but gets injections and no pain today         [facility] PT Assessment - 07/17/17 0001      Assessment   Medical Diagnosis  Right breast cancer    Referring Provider  Dr. Marcello Moores Cornett    Onset Date/Surgical Date  07/01/17    Hand Dominance  Right    Prior Therapy  none      Precautions   Precautions  Other (comment)      Restrictions   Weight Bearing Restrictions  No      Balance Screen   Has the patient fallen in the past 6 months  No    Has the patient had a decrease in activity level because of a fear of falling?   No    Is the patient reluctant to leave their home because of a fear of falling?   No      Home Environment   Living Environment  Private residence    Living Arrangements  Spouse/significant other    Available Help at Discharge  Family      Prior Function   Level of Independence  Independent    Vocation  Retired    Leisure  She walks 15 minutes daily      Cognition   Overall Cognitive Status  Within Functional Limits for tasks assessed    Behaviors  -- Appears very nervous and jittery      Posture/Postural Control   Posture/Postural Control  Postural limitations    Postural Limitations  Rounded Shoulders;Forward head;Increased thoracic kyphosis      ROM / Strength   AROM / PROM / Strength  AROM;Strength      AROM   AROM Assessment Site  Shoulder;Cervical    Right/Left Shoulder  Right;Left    Right Shoulder Extension  59 Degrees    Right Shoulder Flexion  140 Degrees    Right Shoulder ABduction  153 Degrees    Right Shoulder Internal Rotation  55 Degrees    Right Shoulder External Rotation  73 Degrees    Left Shoulder Extension  65 Degrees    Left Shoulder Flexion  147 Degrees    Left Shoulder ABduction  152 Degrees    Left Shoulder Internal Rotation  70 Degrees    Left Shoulder External Rotation  80 Degrees    Cervical Flexion   WNL    Cervical Extension  WNL    Cervical - Right Side Bend  WNL    Cervical - Left Side Bend  WNL    Cervical - Right Rotation  25% limited    Cervical - Left Rotation  50% limited      Strength   Overall Strength  Within functional limits for tasks performed        LYMPHEDEMA/ONCOLOGY QUESTIONNAIRE - 07/17/17 1052      Type   Cancer Type  Right breast       Lymphedema Assessments   Lymphedema Assessments  Upper extremities      Right Upper Extremity Lymphedema   10 cm Proximal to Olecranon Process  31.9 cm    Olecranon Process  24.8 cm    10 cm Proximal to Ulnar Styloid Process  23 cm    Just Proximal to Ulnar Styloid Process  15.3 cm    Across Hand at [employer]  17.9 cm    At [place] of 2nd Digit  6 cm      Left Upper Extremity Lymphedema   10 cm Proximal to Olecranon Process  30.4 cm    Olecranon Process  25 cm    10 cm Proximal to Ulnar Styloid Process  22.4 cm    Just Proximal to Ulnar Styloid Process  14.8 cm    Across Hand at [employer]  16.9 cm    At [place] of 2nd Digit  5.5 cm             Objective measurements completed on examination: See above findings.    Patient was instructed today in a home exercise program today for post op shoulder range of motion. These included active assist shoulder flexion in sitting, scapular retraction, wall walking with shoulder abduction, and hands behind head external rotation.  She was encouraged to do these twice a day, holding 3 seconds and repeating 5 times when permitted by her physician.     PT Education - 07/17/17 1053    Education Details  Lymphedema risk reduction and post op shoulder ROM HEP    Person(s) Educated  Patient    Methods  Explanation;Demonstration;Handout    Comprehension  Returned demonstration;Verbalized understanding  PT Long Term Goals - 07/17/17 1100      PT LONG TERM GOAL #1   Title  Patient will demonstrate she has regained right shoulder function and ROM compared  to baseline when seen post operatively.    Time  8    Period  Weeks    Status  New      Breast Clinic Goals - 07/17/17 1100      Patient will be able to verbalize understanding of pertinent lymphedema risk reduction practices relevant to her diagnosis specifically related to skin care.   Time  1    Period  Days    Status  Achieved      Patient will be able to return demonstrate and/or verbalize understanding of the post-op home exercise program related to regaining shoulder range of motion.   Time  1    Period  Days    Status  Achieved      Patient will be able to verbalize understanding of the importance of attending the postoperative After Breast Cancer Class for further lymphedema risk reduction education and therapeutic exercise.   Time  1    Period  Days    Status  Achieved            Plan - 07/17/17 1054    Clinical Impression Statement  Patient was diagnosed on 07/01/17 with right grade I-II Invasive ductal carcinoma breast cancer. It measures 1.2 cm and is located in the upper inner quadrant. It is ER/PR positive and HER2 negative with a Ki67 of 2%. She has a pelvic prolapse and asthma. She also has chronic back pain. Her multidisciplinary medical team met prior to her assessments to detemine a recommended treatment plan. She is planning to have a right lumpectomy and sentinel node biopsy followed by Oncotype testing, radiation, and anti-estrogen therapy. She will benefit from a post op PT visit to reassess and determine needs.    Clinical Presentation  Stable    Clinical Decision Making  Low    Rehab Potential  Excellent    Clinical Impairments Affecting Rehab Potential  None    PT Frequency  -- Eval and 1 f/u visit    PT Treatment/Interventions  ADLs/Self Care Home Management;Therapeutic exercise;Patient/family education    PT Next Visit Plan  Will reassess 3-4 weeks post op    PT Home Exercise Plan  Post op shoulder ROM HEP    Consulted and Agree with Plan of Care   Patient       Patient will benefit from skilled therapeutic intervention in order to improve the following deficits and impairments:  Decreased knowledge of precautions, Impaired UE functional use, Decreased range of motion, Postural dysfunction, Pain  Visit Diagnosis: Malignant neoplasm of upper-inner quadrant of right breast in female, estrogen receptor positive ([place]) - Plan: PT plan of care cert/re-cert, CANCELED: PT plan of care cert/re-cert  Abnormal posture - Plan: PT plan of care cert/re-cert, CANCELED: PT plan of care cert/re-cert   Patient will follow up at outpatient cancer rehab 3-4 weeks following surgery.  If the patient requires physical therapy at that time, a specific plan will be dictated and sent to the referring physician for approval. The patient was educated today on appropriate basic range of motion exercises to begin post operatively and the importance of attending the After Breast Cancer class following surgery.  Patient was educated today on lymphedema risk reduction practices as it pertains to recommendations that will benefit the patient immediately following surgery.  She  verbalized good understanding.     Problem List Patient Active Problem List   Diagnosis Date Noted  . Malignant neoplasm of upper-outer quadrant of right breast in female, estrogen receptor positive ([place]) 07/12/2017  . Back pain 12/14/2016  . Overweight (BMI 25.0-29.9) 06/14/2015  . Gastric ulceration   . Hiatal hernia   . History of lower GI bleeding   . UGI bleed 01/27/2015  . Acute blood loss anemia 01/27/2015  . Vitamin D deficiency 06/15/2014  . GERD (gastroesophageal reflux disease) 06/15/2014  . Osteopenia 09/02/2013  . Hypothyroidism 06/05/2012  . HLD (hyperlipidemia) 06/05/2012  . Asthma 06/05/2012   Annia Friendly, PT 07/17/17 11:07 AM  [place], [place], [address] Phone: [phone]   Fax:   [phone]  Name: MARTAVIA TYE MRN: [id number] Date of Birth: 05-04-1945

## 2017-07-17 NOTE — Progress Notes (Signed)
Nutrition Assessment  Reason for Assessment:  Pt seen in Breast Clinic  ASSESSMENT:   72 year old female with new diagnosis of breast cancer.  Past medical history reviewed.  Patient reports normal appetite  Medications:  reviewed  Labs: reviewed  Anthropometrics:   Height: 66 inches Weight: 172 lb 14.4 oz BMI: 27   NUTRITION DIAGNOSIS: Food and nutrition related knowledge deficit related to new diagnosis of breast cancer as evidenced by no prior need for nutrition related information.  INTERVENTION:   Discussed and provided packet of information regarding nutritional tips for breast cancer patients.  Questions answered.  Teachback method used.  Contact information provided and patient knows to contact me with questions/concerns.    MONITORING, EVALUATION, and GOAL: Pt will consume a healthy plant based diet to maintain lean body mass throughout treatment.   Noelene Gang B. Zenia Resides, [place], [place] Registered Dietitian [phone] (pager)

## 2017-07-17 NOTE — Assessment & Plan Note (Signed)
07/08/2017: Screening detected right breast mass 1.2 cm at 10 o'clock position 6 cm from the nipple axilla negative: Biopsy revealed invasive ductal carcinoma grade 1-2 with a low-grade DCIS, ER 100%, PR 100%, Ki-67 2%, HER-2 negative ratio 1.29, T1CN0 stage Ia AJCC 8  Pathology and radiology counseling:Discussed with the patient, the details of pathology including the type of breast cancer,the clinical staging, the significance of ER, PR and HER-2/neu receptors and the implications for treatment. After reviewing the pathology in detail, we proceeded to discuss the different treatment options between surgery, radiation, chemotherapy, antiestrogen therapies.  Recommendations: 1. Breast conserving surgery followed by 2. Oncotype DX testing to determine if chemotherapy would be of any benefit followed by 3. Adjuvant radiation therapy followed by 4. Adjuvant antiestrogen therapy  Oncotype counseling: I discussed Oncotype DX test. I explained to the patient that this is a 21 gene panel to evaluate patient tumors DNA to calculate recurrence score. This would help determine whether patient has high risk or intermediate risk or low risk breast cancer. She understands that if her tumor was found to be high risk, she would benefit from systemic chemotherapy. If low risk, no need of chemotherapy. If she was found to be intermediate risk, we would need to evaluate the score as well as other risk factors and determine if an abbreviated chemotherapy may be of benefit.  Return to clinic after surgery to discuss final pathology report and then determine if Oncotype DX testing will need to be sent.

## 2017-07-17 NOTE — Progress Notes (Signed)
[facility] NOTE  Patient Care Team: Sharion Balloon, FNP as PCP - General (Family Medicine) Clent Jacks, MD as Consulting Physician (Ophthalmology) Gala Romney Cristopher Estimable, MD as Consulting Physician (Gastroenterology) Suella Broad, MD as Consulting Physician (Physical Medicine and Rehabilitation) Iran Planas, MD as Consulting Physician (Orthopedic Surgery) Richmond Campbell, MD as Consulting Physician (Gastroenterology) Nicholas Lose, MD as Consulting Physician (Hematology and Oncology) Erroll Luna, MD as Consulting Physician (General Surgery) Kyung Rudd, MD as Consulting Physician (Radiation Oncology)  CHIEF COMPLAINTS/PURPOSE OF CONSULTATION:  Newly diagnosed breast cancer  HISTORY OF PRESENTING ILLNESS:  Cheryl Perry 72 y.o. female is here because of recent diagnosis of right breast cancer.  Patient had a routine screening mammogram that detected a right breast mass measuring 1.2 cm at 10 o'clock position.  Axilla was negative.  Biopsy of this mass revealed grade 1-2 invasive ductal carcinoma that was ER PR 100% positive HER-2 negative with a Ki-67 of 2%.  She was presented this morning to the multidisciplinary tumor board and she is here today to discuss the overall treatment plan.  I reviewed her records extensively and collaborated the history with the patient.  SUMMARY OF ONCOLOGIC HISTORY:   Malignant neoplasm of upper-outer quadrant of right breast in female, estrogen receptor positive ([place])   07/08/2017 Initial Diagnosis    Screening detected right breast mass 1.2 cm at 10 o'clock position 6 cm from the nipple axilla negative: Biopsy revealed invasive ductal carcinoma grade 1-2 with a low-grade DCIS, ER 100%, PR 100%, Ki-67 2%, HER-2 negative ratio 1.29, T1CN0 stage Ia AJCC 8        MEDICAL HISTORY:  Past Medical History:  Diagnosis Date  . Allergy   . Arthritis    back pain   . Asthma   . Back pain   . GERD (gastroesophageal reflux  disease)   . Hiatal hernia   . Hyperlipidemia   . Osteopenia   . Thyroid disease   . Vasculopathy    lymphatic    SURGICAL HISTORY: Past Surgical History:  Procedure Laterality Date  . COLONOSCOPY    . CYSTOSCOPY    . cystscopy    . ESOPHAGOGASTRODUODENOSCOPY N/A 01/28/2015   Dr.Rourk- 3 small prepyloric/gastric ulcers- likely culprits causing bleeding. hiatal hernia, small gastic polyps not manipulated.  . ESOPHAGOGASTRODUODENOSCOPY N/A 05/26/2015   Dr. Gala Romney: Small hiatal hernia, previous gastric ulcers completely healed.  Marland Kitchen KNEE SURGERY    . TONSILLECTOMY    . TUBAL LIGATION      SOCIAL HISTORY: Social History   Socioeconomic History  . Marital status: Married    Spouse name: Not on file  . Number of children: Not on file  . Years of education: Not on file  . Highest education level: Not on file  Occupational History  . Occupation: retired     Comment: HR work  / Astronomer   Social Needs  . Financial resource strain: Not on file  . Food insecurity:    Worry: Not on file    Inability: Not on file  . Transportation needs:    Medical: Not on file    Non-medical: Not on file  Tobacco Use  . Smoking status: Former Smoker    Last attempt to quit: 06/06/1974    Years since quitting: 43.1  . Smokeless tobacco: Never Used  . Tobacco comment: Quit x 40 years ago  Substance and Sexual Activity  . Alcohol use: No    Alcohol/week: 0.0 oz  .  Drug use: No  . Sexual activity: Not on file  Lifestyle  . Physical activity:    Days per week: Not on file    Minutes per session: Not on file  . Stress: Not on file  Relationships  . Social connections:    Talks on phone: Not on file    Gets together: Not on file    Attends religious service: Not on file    Active member of club or organization: Not on file    Attends meetings of clubs or organizations: Not on file    Relationship status: Not on file  . Intimate partner violence:    Fear of current or ex  partner: Not on file    Emotionally abused: Not on file    Physically abused: Not on file    Forced sexual activity: Not on file  Other Topics Concern  . Not on file  Social History Narrative  . Not on file    FAMILY HISTORY: Family History  Problem Relation Age of Onset  . Stroke Mother   . Hypertension Mother   . Alzheimer's disease Mother   . Heart disease Sister   . Hypertension Sister   . Heart attack Sister        Psychologist, forensic   . Arthritis Daughter        very bad MVA   . Hypertension Son   . Seizures Son     ALLERGIES:  is allergic to naproxen; ampicillin; avelox [moxifloxacin hcl in nacl]; bactrim [sulfamethoxazole-trimethoprim]; levaquin [levofloxacin in d5w]; lipitor [atorvastatin]; macrodantin [nitrofurantoin macrocrystal]; premarin [conjugated estrogens]; trental [pentoxifylline er]; and livalo [pitavastatin].  MEDICATIONS:  Current Outpatient Medications  Medication Sig Dispense Refill  . albuterol (PROVENTIL HFA) 108 (90 BASE) MCG/ACT inhaler Inhale 2 puffs into the lungs every 6 (six) hours as needed for wheezing. May use only 3 times a month    . B Complex Vitamins (VITAMIN B COMPLEX PO) Take 1 tablet by mouth daily.    . Biotin 300 MCG TABS Take 300 mcg by mouth daily.     Marland Kitchen CALCIUM-MAGNESIUM-ZINC PO Take 1 tablet by mouth daily.    . Cholecalciferol (VITAMIN D) 2000 UNITS CAPS Take 1 capsule by mouth daily.    Marland Kitchen EPINEPHrine 0.3 mg/0.3 mL IJ SOAJ injection See admin instructions.  0  . fluticasone-salmeterol (ADVAIR HFA) 45-21 MCG/ACT inhaler Inhale 2 puffs into the lungs 2 (two) times daily. 3 Inhaler 3  . levothyroxine (SYNTHROID, LEVOTHROID) 75 MCG tablet TAKE 1 TABLET (75 MCG TOTAL)  BY MOUTH DAILY BEFORE BREAKFAST. 90 tablet 3  . loratadine (CLARITIN) 10 MG tablet Take 10 mg by mouth daily.    . methocarbamol (ROBAXIN) 500 MG tablet Take 1 tablet (500 mg total) by mouth 3 (three) times daily. (Patient taking differently: Take 500 mg by mouth every 8  (eight) hours as needed for muscle spasms. ) 90 tablet 1  . montelukast (SINGULAIR) 10 MG tablet Take 10 mg by mouth at bedtime.    . pantoprazole (PROTONIX) 40 MG tablet Take 1 tablet (40 mg total) by mouth daily before breakfast. 90 tablet 3  . Probiotic Product (PROBIOTIC ADVANCED PO) Take 1 capsule by mouth daily.     . rosuvastatin (CRESTOR) 5 MG tablet TAKE 1 TABLET EVERY WEEK TO BEGIN 30 tablet 0   No current facility-administered medications for this visit.     REVIEW OF SYSTEMS:   Constitutional: Denies fevers, chills or abnormal night sweats Eyes: Denies blurriness of vision, double  vision or watery eyes Ears, nose, mouth, throat, and face: Denies mucositis or sore throat Respiratory: Denies cough, dyspnea or wheezes Cardiovascular: Denies palpitation, chest discomfort or lower extremity swelling Gastrointestinal:  Denies nausea, heartburn or change in bowel habits Skin: Denies abnormal skin rashes Lymphatics: Denies new lymphadenopathy or easy bruising Neurological:Denies numbness, tingling or new weaknesses Behavioral/Psych: Mood is stable, no new changes  Breast:  Denies any palpable lumps or discharge All other systems were reviewed with the patient and are negative.  PHYSICAL EXAMINATION: ECOG PERFORMANCE STATUS: 1 - Symptomatic but completely ambulatory  There were no vitals filed for this visit. There were no vitals filed for this visit.  GENERAL:alert, no distress and comfortable SKIN: skin color, texture, turgor are normal, no rashes or significant lesions EYES: normal, conjunctiva are pink and non-injected, sclera clear OROPHARYNX:no exudate, no erythema and lips, buccal mucosa, and tongue normal  NECK: supple, thyroid normal size, non-tender, without nodularity LYMPH:  no palpable lymphadenopathy in the cervical, axillary or inguinal LUNGS: clear to auscultation and percussion with normal breathing effort HEART: regular rate & rhythm and no murmurs and no  lower extremity edema ABDOMEN:abdomen soft, non-tender and normal bowel sounds Musculoskeletal:no cyanosis of digits and no clubbing  PSYCH: alert & oriented x 3 with fluent speech NEURO: no focal motor/sensory deficits BREAST: No palpable nodules in breast. No palpable axillary or supraclavicular lymphadenopathy (exam performed in the presence of a chaperone)   LABORATORY DATA:  I have reviewed the data as listed Lab Results  Component Value Date   WBC 5.8 07/17/2017   HGB 14.6 07/17/2017   HCT 44.7 07/17/2017   MCV 99.3 07/17/2017   PLT 205 07/17/2017   Lab Results  Component Value Date   NA 142 06/17/2017   K 4.7 06/17/2017   CL 101 06/17/2017   CO2 22 06/17/2017    RADIOGRAPHIC STUDIES: I have personally reviewed the radiological reports and agreed with the findings in the report.  ASSESSMENT AND PLAN:  Malignant neoplasm of upper-outer quadrant of right breast in female, estrogen receptor positive ([place]) 07/08/2017: Screening detected right breast mass 1.2 cm at 10 o'clock position 6 cm from the nipple axilla negative: Biopsy revealed invasive ductal carcinoma grade 1-2 with a low-grade DCIS, ER 100%, PR 100%, Ki-67 2%, HER-2 negative ratio 1.29, T1CN0 stage Ia AJCC 8  Pathology and radiology counseling:Discussed with the patient, the details of pathology including the type of breast cancer,the clinical staging, the significance of ER, PR and HER-2/neu receptors and the implications for treatment. After reviewing the pathology in detail, we proceeded to discuss the different treatment options between surgery, radiation, chemotherapy, antiestrogen therapies.  Recommendations: 1. Breast conserving surgery followed by 2. Oncotype DX testing to determine if chemotherapy would be of any benefit followed by 3. Adjuvant radiation therapy followed by 4. Adjuvant antiestrogen therapy  Oncotype counseling: I discussed Oncotype DX test. I explained to the patient that this is a 21 gene  panel to evaluate patient tumors DNA to calculate recurrence score. This would help determine whether patient has high risk or intermediate risk or low risk breast cancer. She understands that if her tumor was found to be high risk, she would benefit from systemic chemotherapy. If low risk, no need of chemotherapy. If she was found to be intermediate risk, we would need to evaluate the score as well as other risk factors and determine if an abbreviated chemotherapy may be of benefit.  Return to clinic after surgery to discuss final pathology report  and then determine if Oncotype DX testing will need to be sent.      All questions were answered. The patient knows to call the clinic with any problems, questions or concerns.    Harriette Ohara, MD 07/17/17

## 2017-07-17 NOTE — Patient Instructions (Signed)

## 2017-07-17 NOTE — Progress Notes (Signed)
Radiation Oncology         [phone]) [phone] ________________________________  Name: Cheryl Perry        MRN: [id number]  Date of Service: 07/17/2017 DOB: 11-01-1945  QJ:JHERD, Theador Hawthorne, FNP  Erroll Luna, MD     REFERRING PHYSICIAN: Erroll Luna, MD   DIAGNOSIS: The encounter diagnosis was Malignant neoplasm of upper-outer quadrant of right breast in female, estrogen receptor positive ([place]).   HISTORY OF PRESENT ILLNESS: Cheryl Perry is a 72 y.o. female seen in the multidisciplinary breast clinic for a new diagnosis of right breast cancer. The patient was noted to have a screening detected mass in the right breast. She underwent diagnostic imaging that revealed a 1.2 cm lobulated mass, and her axilla was negative. She had a biopsy on  07/08/17 tha trevealed a grade 1-2 invasive ductal carcinoma, ER/PR positive, HEr2 negative with a Ki 67 of 2%. She comes today to discuss options of treatment for her cancer.   PREVIOUS RADIATION THERAPY: No   PAST MEDICAL HISTORY:  Past Medical History:  Diagnosis Date  . Allergy   . Arthritis    back pain   . Asthma   . Back pain   . GERD (gastroesophageal reflux disease)   . Hiatal hernia   . Hyperlipidemia   . Osteopenia   . Thyroid disease   . Vasculopathy    lymphatic       PAST SURGICAL HISTORY: Past Surgical History:  Procedure Laterality Date  . COLONOSCOPY    . CYSTOSCOPY    . cystscopy    . ESOPHAGOGASTRODUODENOSCOPY N/A 01/28/2015   Dr.Rourk- 3 small prepyloric/gastric ulcers- likely culprits causing bleeding. hiatal hernia, small gastic polyps not manipulated.  . ESOPHAGOGASTRODUODENOSCOPY N/A 05/26/2015   Dr. Gala Romney: Small hiatal hernia, previous gastric ulcers completely healed.  Marland Kitchen KNEE SURGERY    . TONSILLECTOMY    . TUBAL LIGATION       FAMILY HISTORY:  Family History  Problem Relation Age of Onset  . Stroke Mother   . Hypertension Mother   . Alzheimer's disease Mother   . Heart disease Sister   .  Hypertension Sister   . Heart attack Sister        Psychologist, forensic   . Arthritis Daughter        very bad MVA   . Hypertension Son   . Seizures Son      SOCIAL HISTORY:  reports that she quit smoking about 43 years ago. She has never used smokeless tobacco. She reports that she does not drink alcohol or use drugs. The patient is married and lives in [place], [place]. She is retired from working in [employer] for the [employer]: Naproxen; Ampicillin; Avelox [moxifloxacin hcl in nacl]; Bactrim [sulfamethoxazole-trimethoprim]; Levaquin [levofloxacin in d5w]; Lipitor [atorvastatin]; Macrodantin [nitrofurantoin macrocrystal]; Premarin [conjugated estrogens]; Trental [pentoxifylline er]; and Livalo [pitavastatin]   MEDICATIONS:  Current Outpatient Medications  Medication Sig Dispense Refill  . albuterol (PROVENTIL HFA) 108 (90 BASE) MCG/ACT inhaler Inhale 2 puffs into the lungs every 6 (six) hours as needed for wheezing. May use only 3 times a month    . B Complex Vitamins (VITAMIN B COMPLEX PO) Take 1 tablet by mouth daily.    . Biotin 300 MCG TABS Take 300 mcg by mouth daily.     Marland Kitchen CALCIUM-MAGNESIUM-ZINC PO Take 1 tablet by mouth daily.    . Cholecalciferol (VITAMIN D) 2000 UNITS CAPS Take 1 capsule by mouth daily.    Marland Kitchen  EPINEPHrine 0.3 mg/0.3 mL IJ SOAJ injection See admin instructions.  0  . fluticasone-salmeterol (ADVAIR HFA) 45-21 MCG/ACT inhaler Inhale 2 puffs into the lungs 2 (two) times daily. 3 Inhaler 3  . levothyroxine (SYNTHROID, LEVOTHROID) 75 MCG tablet TAKE 1 TABLET (75 MCG TOTAL)  BY MOUTH DAILY BEFORE BREAKFAST. 90 tablet 3  . loratadine (CLARITIN) 10 MG tablet Take 10 mg by mouth daily.    . methocarbamol (ROBAXIN) 500 MG tablet Take 1 tablet (500 mg total) by mouth 3 (three) times daily. (Patient taking differently: Take 500 mg by mouth every 8 (eight) hours as needed for muscle spasms. ) 90 tablet 1  . montelukast (SINGULAIR) 10 MG tablet Take 10 mg by mouth at  bedtime.    . pantoprazole (PROTONIX) 40 MG tablet Take 1 tablet (40 mg total) by mouth daily before breakfast. 90 tablet 3  . Probiotic Product (PROBIOTIC ADVANCED PO) Take 1 capsule by mouth daily.     . rosuvastatin (CRESTOR) 5 MG tablet TAKE 1 TABLET EVERY WEEK TO BEGIN 30 tablet 0   No current facility-administered medications for this encounter.      REVIEW OF SYSTEMS: On review of systems, the patient reports that she is doing well overall. She denies any chest pain, shortness of breath, cough, fevers, chills, night sweats, unintended weight changes. She denies any bowel disturbances, and does have some occasional stress incontinence. She denies abdominal pain, nausea or vomiting. She denies any new musculoskeletal or joint aches or pains. A complete review of systems is obtained and is otherwise negative.     PHYSICAL EXAM:  Wt Readings from Last 3 Encounters:  07/09/17 173 lb (78.5 kg)  06/17/17 171 lb 6.4 oz (77.7 kg)  06/12/17 171 lb (77.6 kg)   Temp Readings from Last 3 Encounters:  06/17/17 (!) 97.4 F (36.3 C) (Oral)  06/12/17 98 F (36.7 C) (Oral)  01/21/17 (!) 97.2 F (36.2 C) (Oral)   BP Readings from Last 3 Encounters:  07/09/17 136/85  06/17/17 126/78  06/12/17 128/81   Pulse Readings from Last 3 Encounters:  07/09/17 (!) 55  06/17/17 (!) 52  06/12/17 (!) 57     In general this is a well appearing caucasian female in no acute distress. She is alert and oriented x4 and appropriate throughout the examination. HEENT reveals that the patient is normocephalic, atraumatic. EOMs are intact.  Skin is intact without any evidence of gross lesions. Cardiovascular exam reveals a regular rate and rhythm, no clicks rubs are noted but there is a dropped S2 every few beats Chest is clear to auscultation bilaterally. Lymphatic assessment is performed and does not reveal any adenopathy in the cervical, supraclavicular, axillary, or inguinal chains. Bilateral breast exam is  performed and reveals mild ecchymosis of the right breast at the biopsy site without erythema. No mass is noted, nor of the left breast.  No nipple bleeding or discharge is noted of bilateral breasts. Abdomen has active bowel sounds in all quadrants and is intact. The abdomen is soft, non tender, non distended. Lower extremities are negative for pretibial pitting edema, deep calf tenderness, cyanosis or clubbing.   ECOG = 0  0 - Asymptomatic (Fully active, able to carry on all predisease activities without restriction)  1 - Symptomatic but completely ambulatory (Restricted in physically strenuous activity but ambulatory and able to carry out work of a light or sedentary nature. For example, light housework, office work)  2 - Symptomatic, <50% in bed during the day (Ambulatory  and capable of all self care but unable to carry out any work activities. Up and about more than 50% of waking hours)  3 - Symptomatic, >50% in bed, but not bedbound (Capable of only limited self-care, confined to bed or chair 50% or more of waking hours)  4 - Bedbound (Completely disabled. Cannot carry on any self-care. Totally confined to bed or chair)  5 - Death   Eustace Pen MM, Creech RH, Tormey DC, et al. [phone]). "Toxicity and response criteria of the [facility] Group". [place] Oncol. 5 (6): 649-55    LABORATORY DATA:  Lab Results  Component Value Date   WBC 6.6 06/17/2017   HGB 14.5 06/17/2017   HCT 43.2 06/17/2017   MCV 95 06/17/2017   PLT 237 06/17/2017   Lab Results  Component Value Date   NA 142 06/17/2017   K 4.7 06/17/2017   CL 101 06/17/2017   CO2 22 06/17/2017   Lab Results  Component Value Date   ALT 17 06/17/2017   AST 22 06/17/2017   ALKPHOS 66 06/17/2017   BILITOT <0.2 06/17/2017      RADIOGRAPHY: No results found.     IMPRESSION/PLAN: 1. Stage IA, [id number], grade 2 ER/PR positive invasive ductal carcinoma of the right breast. Dr. Lisbeth Renshaw discusses the pathology  findings and reviews the nature of invasive breast disease. The consensus from the breast conference includes breast conservation with lumpectomy with  sentinel node biopsy. Depending on the size of the final tumor measurements rendered by pathology, the tumor may be tested for Oncotype Dx score to determine a role for systemic therapy. Provided that chemotherapy is not indicated, the patient's course would then be followed by external radiotherapy to the breast followed by antiestrogen therapy. We discussed the risks, benefits, short, and long term effects of radiotherapy, and the patient is interested in proceeding. Dr. Lisbeth Renshaw discusses the delivery and logistics of radiotherapy and anticipates a course of 4 or 6 1/2 weeks of radiotherapy. We will see her back about 2 weeks after surgery to discuss the simulation process and anticipate we starting radiotherapy about 4-6 weeks after surgery.    The above documentation reflects my direct findings during this shared patient visit. Please see the separate note by Dr. Lisbeth Renshaw on this date for the remainder of the patient's plan of care.    Carola Rhine, PAC

## 2017-07-17 NOTE — Telephone Encounter (Signed)
Received telephone call from [facility]. They are requesting to see if patient's echo can be done asap rather than 07-31-17.  Called and left message asking patient to call [facility]. Leafy Ro can do echo today 07-17-17 @ 4pm.

## 2017-07-22 DIAGNOSIS — N814 Uterovaginal prolapse, unspecified: Secondary | ICD-10-CM | POA: Diagnosis not present

## 2017-07-23 ENCOUNTER — Telehealth: Payer: Self-pay | Admitting: *Deleted

## 2017-07-23 NOTE — Telephone Encounter (Signed)
Spoke to pt concerning [place] from 7.10.19. Denies questions or concerns regarding dx or treatment care plan. Encourage pt to call with needs. Received verbal understanding.

## 2017-07-29 DIAGNOSIS — J3089 Other allergic rhinitis: Secondary | ICD-10-CM | POA: Diagnosis not present

## 2017-07-29 DIAGNOSIS — J301 Allergic rhinitis due to pollen: Secondary | ICD-10-CM | POA: Diagnosis not present

## 2017-07-29 DIAGNOSIS — J3081 Allergic rhinitis due to animal (cat) (dog) hair and dander: Secondary | ICD-10-CM | POA: Diagnosis not present

## 2017-07-31 ENCOUNTER — Ambulatory Visit (INDEPENDENT_AMBULATORY_CARE_PROVIDER_SITE_OTHER): Payer: Medicare Other

## 2017-07-31 ENCOUNTER — Other Ambulatory Visit: Payer: Self-pay

## 2017-07-31 DIAGNOSIS — R9431 Abnormal electrocardiogram [ECG] [EKG]: Secondary | ICD-10-CM | POA: Diagnosis not present

## 2017-08-01 ENCOUNTER — Telehealth: Payer: Self-pay | Admitting: Cardiology

## 2017-08-01 NOTE — Telephone Encounter (Signed)
Calling for echo results

## 2017-08-01 NOTE — Telephone Encounter (Signed)
Echo done yesterday

## 2017-08-02 ENCOUNTER — Telehealth: Payer: Self-pay

## 2017-08-02 NOTE — Telephone Encounter (Signed)
Patient notified. Routed to PCP 

## 2017-08-02 NOTE — Telephone Encounter (Signed)
-----   Message from Arnoldo Lenis, MD sent at 08/02/2017  1:03 PM EDT ----- Echo overall looks good, normal heart function with no evidence of any prior damage. Overall looks good. F/u 6 months  Zandra Abts MD

## 2017-08-07 NOTE — Telephone Encounter (Signed)
Pt made aware 7/25 phone note

## 2017-08-08 ENCOUNTER — Telehealth: Payer: Self-pay | Admitting: Hematology and Oncology

## 2017-08-08 NOTE — Telephone Encounter (Signed)
Per 7/29 sch msg, added appt w/ Dr. Lindi Adie. Called patient w/ appt for 8/22 @ 2 pm.

## 2017-08-13 ENCOUNTER — Encounter (HOSPITAL_BASED_OUTPATIENT_CLINIC_OR_DEPARTMENT_OTHER): Payer: Self-pay | Admitting: *Deleted

## 2017-08-14 NOTE — Progress Notes (Signed)
[facility] 

## 2017-08-16 DIAGNOSIS — J3089 Other allergic rhinitis: Secondary | ICD-10-CM | POA: Diagnosis not present

## 2017-08-16 DIAGNOSIS — J3081 Allergic rhinitis due to animal (cat) (dog) hair and dander: Secondary | ICD-10-CM | POA: Diagnosis not present

## 2017-08-16 DIAGNOSIS — J301 Allergic rhinitis due to pollen: Secondary | ICD-10-CM | POA: Diagnosis not present

## 2017-08-20 DIAGNOSIS — D0591 Unspecified type of carcinoma in situ of right breast: Secondary | ICD-10-CM | POA: Diagnosis not present

## 2017-08-20 DIAGNOSIS — C50411 Malignant neoplasm of upper-outer quadrant of right female breast: Secondary | ICD-10-CM | POA: Diagnosis not present

## 2017-08-20 NOTE — Progress Notes (Signed)
Ensure Pre-Surgery drink given with instructions to drink by 0730 DOS, surgical soap given with instructions on use, pt verbalized understanding.

## 2017-08-22 ENCOUNTER — Ambulatory Visit (HOSPITAL_BASED_OUTPATIENT_CLINIC_OR_DEPARTMENT_OTHER): Payer: Medicare Other | Admitting: Anesthesiology

## 2017-08-22 ENCOUNTER — Other Ambulatory Visit: Payer: Self-pay

## 2017-08-22 ENCOUNTER — Ambulatory Visit (HOSPITAL_BASED_OUTPATIENT_CLINIC_OR_DEPARTMENT_OTHER)
Admission: RE | Admit: 2017-08-22 | Discharge: 2017-08-22 | Disposition: A | Discharge status: Home / Self Care | Payer: Medicare Other | Source: Ambulatory Visit | Attending: Surgery | Admitting: Surgery

## 2017-08-22 ENCOUNTER — Encounter (HOSPITAL_BASED_OUTPATIENT_CLINIC_OR_DEPARTMENT_OTHER)
Admission: RE | Disposition: A | Discharge status: Home / Self Care | Payer: Self-pay | Source: Ambulatory Visit | Attending: Surgery

## 2017-08-22 ENCOUNTER — Ambulatory Visit (HOSPITAL_COMMUNITY)
Admission: RE | Admit: 2017-08-22 | Discharge: 2017-08-22 | Disposition: A | Discharge status: Home / Self Care | Payer: Medicare Other | Source: Ambulatory Visit | Attending: Surgery | Admitting: Surgery

## 2017-08-22 ENCOUNTER — Encounter (HOSPITAL_BASED_OUTPATIENT_CLINIC_OR_DEPARTMENT_OTHER): Payer: Self-pay

## 2017-08-22 DIAGNOSIS — Z86010 Personal history of colonic polyps: Secondary | ICD-10-CM | POA: Diagnosis not present

## 2017-08-22 DIAGNOSIS — Z8249 Family history of ischemic heart disease and other diseases of the circulatory system: Secondary | ICD-10-CM | POA: Diagnosis not present

## 2017-08-22 DIAGNOSIS — Z170 Estrogen receptor positive status [ER+]: Secondary | ICD-10-CM | POA: Insufficient documentation

## 2017-08-22 DIAGNOSIS — Z87891 Personal history of nicotine dependence: Secondary | ICD-10-CM | POA: Insufficient documentation

## 2017-08-22 DIAGNOSIS — E039 Hypothyroidism, unspecified: Secondary | ICD-10-CM | POA: Insufficient documentation

## 2017-08-22 DIAGNOSIS — K219 Gastro-esophageal reflux disease without esophagitis: Secondary | ICD-10-CM | POA: Diagnosis not present

## 2017-08-22 DIAGNOSIS — Z7951 Long term (current) use of inhaled steroids: Secondary | ICD-10-CM | POA: Diagnosis not present

## 2017-08-22 DIAGNOSIS — C50411 Malignant neoplasm of upper-outer quadrant of right female breast: Secondary | ICD-10-CM | POA: Diagnosis not present

## 2017-08-22 DIAGNOSIS — Z9981 Dependence on supplemental oxygen: Secondary | ICD-10-CM | POA: Diagnosis not present

## 2017-08-22 DIAGNOSIS — C50911 Malignant neoplasm of unspecified site of right female breast: Secondary | ICD-10-CM | POA: Diagnosis not present

## 2017-08-22 DIAGNOSIS — Z79899 Other long term (current) drug therapy: Secondary | ICD-10-CM | POA: Insufficient documentation

## 2017-08-22 DIAGNOSIS — Z8711 Personal history of peptic ulcer disease: Secondary | ICD-10-CM | POA: Diagnosis not present

## 2017-08-22 DIAGNOSIS — E7800 Pure hypercholesterolemia, unspecified: Secondary | ICD-10-CM | POA: Insufficient documentation

## 2017-08-22 DIAGNOSIS — Z79890 Hormone replacement therapy (postmenopausal): Secondary | ICD-10-CM | POA: Diagnosis not present

## 2017-08-22 DIAGNOSIS — G8918 Other acute postprocedural pain: Secondary | ICD-10-CM | POA: Diagnosis not present

## 2017-08-22 DIAGNOSIS — J45909 Unspecified asthma, uncomplicated: Secondary | ICD-10-CM | POA: Diagnosis not present

## 2017-08-22 HISTORY — DX: Hypothyroidism, unspecified: E03.9

## 2017-08-22 HISTORY — DX: Nausea with vomiting, unspecified: R11.2

## 2017-08-22 HISTORY — PX: BREAST LUMPECTOMY WITH RADIOACTIVE SEED AND SENTINEL LYMPH NODE BIOPSY: SHX6550

## 2017-08-22 HISTORY — DX: Other specified postprocedural states: Z98.890

## 2017-08-22 HISTORY — DX: Adverse effect of unspecified anesthetic, initial encounter: T41.45XA

## 2017-08-22 HISTORY — DX: Malignant (primary) neoplasm, unspecified: C80.1

## 2017-08-22 HISTORY — DX: Other complications of anesthesia, initial encounter: T88.59XA

## 2017-08-22 SURGERY — BREAST LUMPECTOMY WITH RADIOACTIVE SEED AND SENTINEL LYMPH NODE BIOPSY
Anesthesia: General | Site: Breast | Laterality: Right

## 2017-08-22 MED ORDER — BUPIVACAINE HCL (PF) 0.75 % IJ SOLN
INTRAMUSCULAR | Status: DC | PRN
  Administered 2017-08-22: 30 mL

## 2017-08-22 MED ORDER — BUPIVACAINE-EPINEPHRINE (PF) 0.25% -1:200000 IJ SOLN
INTRAMUSCULAR | Status: DC | PRN
  Administered 2017-08-22: 10 mL

## 2017-08-22 MED ORDER — MIDAZOLAM HCL 2 MG/2ML IJ SOLN
INTRAMUSCULAR | Status: AC
  Filled 2017-08-22: qty 2

## 2017-08-22 MED ORDER — SCOPOLAMINE 1 MG/3DAYS TD PT72
1.0000 | MEDICATED_PATCH | Freq: Once | TRANSDERMAL | Status: DC | PRN
  Administered 2017-08-22: 1.5 mg via TRANSDERMAL

## 2017-08-22 MED ORDER — PROPOFOL 10 MG/ML IV BOLUS
INTRAVENOUS | Status: DC | PRN
  Administered 2017-08-22: 150 mg via INTRAVENOUS
  Administered 2017-08-22: 30 mg via INTRAVENOUS

## 2017-08-22 MED ORDER — FENTANYL CITRATE (PF) 100 MCG/2ML IJ SOLN
INTRAMUSCULAR | Status: AC
  Filled 2017-08-22: qty 2

## 2017-08-22 MED ORDER — FENTANYL CITRATE (PF) 100 MCG/2ML IJ SOLN
50.0000 ug | INTRAMUSCULAR | Status: DC | PRN
  Administered 2017-08-22: 25 ug via INTRAVENOUS
  Administered 2017-08-22: 50 ug via INTRAVENOUS

## 2017-08-22 MED ORDER — ACETAMINOPHEN 500 MG PO TABS
ORAL_TABLET | ORAL | Status: AC
  Filled 2017-08-22: qty 2

## 2017-08-22 MED ORDER — FENTANYL CITRATE (PF) 100 MCG/2ML IJ SOLN
25.0000 ug | INTRAMUSCULAR | Status: DC | PRN
  Administered 2017-08-22: 25 ug via INTRAVENOUS

## 2017-08-22 MED ORDER — CHLORHEXIDINE GLUCONATE CLOTH 2 % EX PADS: 6.0000 | MEDICATED_PAD | Freq: Once | CUTANEOUS | Status: DC

## 2017-08-22 MED ORDER — BUPIVACAINE-EPINEPHRINE (PF) 0.25% -1:200000 IJ SOLN
INTRAMUSCULAR | Status: AC
  Filled 2017-08-22: qty 30

## 2017-08-22 MED ORDER — MIDAZOLAM HCL 2 MG/2ML IJ SOLN
1.0000 mg | INTRAMUSCULAR | Status: DC | PRN
  Administered 2017-08-22: 1 mg via INTRAVENOUS

## 2017-08-22 MED ORDER — LIDOCAINE HCL (CARDIAC) PF 100 MG/5ML IV SOSY
PREFILLED_SYRINGE | INTRAVENOUS | Status: DC | PRN
  Administered 2017-08-22: 60 mg via INTRAVENOUS

## 2017-08-22 MED ORDER — MEPERIDINE HCL 25 MG/ML IJ SOLN: 6.2500 mg | INTRAMUSCULAR | Status: DC | PRN

## 2017-08-22 MED ORDER — LACTATED RINGERS IV SOLN
INTRAVENOUS | Status: DC
  Administered 2017-08-22 (×2): via INTRAVENOUS

## 2017-08-22 MED ORDER — METHYLENE BLUE 0.5 % INJ SOLN
INTRAVENOUS | Status: AC
  Filled 2017-08-22: qty 10

## 2017-08-22 MED ORDER — ACETAMINOPHEN 500 MG PO TABS
1000.0000 mg | ORAL_TABLET | ORAL | Status: AC
  Administered 2017-08-22: 1000 mg via ORAL

## 2017-08-22 MED ORDER — ONDANSETRON HCL 4 MG/2ML IJ SOLN
INTRAMUSCULAR | Status: DC | PRN
  Administered 2017-08-22: 4 mg via INTRAVENOUS

## 2017-08-22 MED ORDER — GLYCOPYRROLATE 0.2 MG/ML IJ SOLN
INTRAMUSCULAR | Status: DC | PRN
  Administered 2017-08-22: 0.1 mg via INTRAVENOUS

## 2017-08-22 MED ORDER — SCOPOLAMINE 1 MG/3DAYS TD PT72
MEDICATED_PATCH | TRANSDERMAL | Status: AC
Start: 2017-08-22 — End: ?
  Filled 2017-08-22: qty 1

## 2017-08-22 MED ORDER — GABAPENTIN 300 MG PO CAPS
ORAL_CAPSULE | ORAL | Status: AC
  Filled 2017-08-22: qty 1

## 2017-08-22 MED ORDER — SODIUM CHLORIDE 0.9 % IJ SOLN
INTRAMUSCULAR | Status: AC
  Filled 2017-08-22: qty 10

## 2017-08-22 MED ORDER — CLINDAMYCIN PHOSPHATE 900 MG/50ML IV SOLN
INTRAVENOUS | Status: AC
  Filled 2017-08-22: qty 50

## 2017-08-22 MED ORDER — GABAPENTIN 300 MG PO CAPS
300.0000 mg | ORAL_CAPSULE | ORAL | Status: AC
  Administered 2017-08-22: 300 mg via ORAL

## 2017-08-22 MED ORDER — HYDROCODONE-ACETAMINOPHEN 5-325 MG PO TABS: 1.0000 | ORAL_TABLET | Freq: Four times a day (QID) | ORAL | 0 refills | Status: DC | PRN

## 2017-08-22 MED ORDER — TECHNETIUM TC 99M SULFUR COLLOID FILTERED
1.0000 | Freq: Once | INTRAVENOUS | Status: AC | PRN
  Administered 2017-08-22: 1 via INTRADERMAL

## 2017-08-22 MED ORDER — EPHEDRINE SULFATE 50 MG/ML IJ SOLN
INTRAMUSCULAR | Status: DC | PRN
  Administered 2017-08-22 (×3): 10 mg via INTRAVENOUS

## 2017-08-22 MED ORDER — CLINDAMYCIN PHOSPHATE 900 MG/50ML IV SOLN
900.0000 mg | INTRAVENOUS | Status: AC
  Administered 2017-08-22: 900 mg via INTRAVENOUS

## 2017-08-22 MED ORDER — DEXAMETHASONE SODIUM PHOSPHATE 4 MG/ML IJ SOLN
INTRAMUSCULAR | Status: DC | PRN
  Administered 2017-08-22: 10 mg via INTRAVENOUS

## 2017-08-22 SURGICAL SUPPLY — 50 items
ADH SKN CLS APL DERMABOND .7 (GAUZE/BANDAGES/DRESSINGS) ×1
APPLIER CLIP 9.375 MED OPEN (MISCELLANEOUS) ×3
APR CLP MED 9.3 20 MLT OPN (MISCELLANEOUS) ×1
BINDER BREAST LRG (GAUZE/BANDAGES/DRESSINGS) IMPLANT
BINDER BREAST MEDIUM (GAUZE/BANDAGES/DRESSINGS) IMPLANT
BINDER BREAST XLRG (GAUZE/BANDAGES/DRESSINGS) IMPLANT
BINDER BREAST XXLRG (GAUZE/BANDAGES/DRESSINGS) IMPLANT
BLADE SURG 15 STRL LF DISP TIS (BLADE) ×1 IMPLANT
BLADE SURG 15 STRL SS (BLADE) ×3
CANISTER SUC SOCK COL 7IN (MISCELLANEOUS) IMPLANT
CANISTER SUCT 1200ML W/VALVE (MISCELLANEOUS) ×3 IMPLANT
CHLORAPREP W/TINT 26ML (MISCELLANEOUS) ×3 IMPLANT
CLIP APPLIE 9.375 MED OPEN (MISCELLANEOUS) ×1 IMPLANT
COVER BACK TABLE 60X90IN (DRAPES) ×3 IMPLANT
COVER MAYO STAND STRL (DRAPES) ×3 IMPLANT
COVER PROBE W GEL 5X96 (DRAPES) ×3 IMPLANT
DECANTER SPIKE VIAL GLASS SM (MISCELLANEOUS) IMPLANT
DERMABOND ADVANCED (GAUZE/BANDAGES/DRESSINGS) ×2
DERMABOND ADVANCED .7 DNX12 (GAUZE/BANDAGES/DRESSINGS) ×1 IMPLANT
DEVICE DUBIN W/COMP PLATE 8390 (MISCELLANEOUS) ×3 IMPLANT
DRAPE LAPAROSCOPIC ABDOMINAL (DRAPES) ×3 IMPLANT
DRAPE UTILITY XL STRL (DRAPES) ×3 IMPLANT
ELECT COATED BLADE 2.86 ST (ELECTRODE) ×3 IMPLANT
ELECT REM PT RETURN 9FT ADLT (ELECTROSURGICAL) ×3
ELECTRODE REM PT RTRN 9FT ADLT (ELECTROSURGICAL) ×1 IMPLANT
GLOVE BIOGEL PI IND STRL 8 (GLOVE) ×1 IMPLANT
GLOVE BIOGEL PI INDICATOR 8 (GLOVE) ×2
GLOVE ECLIPSE 8.0 STRL XLNG CF (GLOVE) ×3 IMPLANT
GOWN STRL REUS W/ TWL LRG LVL3 (GOWN DISPOSABLE) ×2 IMPLANT
GOWN STRL REUS W/TWL LRG LVL3 (GOWN DISPOSABLE) ×6
HEMOSTAT ARISTA ABSORB 3G PWDR (MISCELLANEOUS) IMPLANT
HEMOSTAT SNOW SURGICEL 2X4 (HEMOSTASIS) ×3 IMPLANT
KIT MARKER MARGIN INK (KITS) ×3 IMPLANT
NDL HYPO 25X1 1.5 SAFETY (NEEDLE) ×1 IMPLANT
NDL SAFETY ECLIPSE 18X1.5 (NEEDLE) IMPLANT
NEEDLE HYPO 18GX1.5 SHARP (NEEDLE)
NEEDLE HYPO 25X1 1.5 SAFETY (NEEDLE) ×3 IMPLANT
NS IRRIG 1000ML POUR BTL (IV SOLUTION) ×3 IMPLANT
PACK BASIN DAY SURGERY FS (CUSTOM PROCEDURE TRAY) ×3 IMPLANT
PENCIL BUTTON HOLSTER BLD 10FT (ELECTRODE) ×3 IMPLANT
SLEEVE SCD COMPRESS KNEE MED (MISCELLANEOUS) ×3 IMPLANT
SPONGE LAP 4X18 RFD (DISPOSABLE) ×3 IMPLANT
SUT MNCRL AB 4-0 PS2 18 (SUTURE) ×3 IMPLANT
SUT VICRYL 3-0 CR8 SH (SUTURE) ×3 IMPLANT
SYR CONTROL 10ML LL (SYRINGE) ×3 IMPLANT
TOWEL GREEN STERILE FF (TOWEL DISPOSABLE) ×3 IMPLANT
TOWEL OR NON WOVEN STRL DISP B (DISPOSABLE) ×3 IMPLANT
TUBE CONNECTING 20'X1/4 (TUBING) ×1
TUBE CONNECTING 20X1/4 (TUBING) ×2 IMPLANT
YANKAUER SUCT BULB TIP NO VENT (SUCTIONS) ×3 IMPLANT

## 2017-08-22 NOTE — Anesthesia Preprocedure Evaluation (Addendum)
Anesthesia Evaluation  Patient identified by MRN, date of birth, ID band Patient awake    Reviewed: Allergy & Precautions, H&P , NPO status , Patient's Chart, lab work & pertinent test results, reviewed documented beta blocker date and time   History of Anesthesia Complications (+) PONV and history of anesthetic complications  Airway Mallampati: II  TM Distance: >3 FB Neck ROM: full    Dental no notable dental hx.    Pulmonary asthma , former smoker,    Pulmonary exam normal breath sounds clear to auscultation       Cardiovascular Exercise Tolerance: Good negative cardio ROS   Rhythm:regular Rate:Normal     Neuro/Psych negative neurological ROS  negative psych ROS   GI/Hepatic Neg liver ROS, hiatal hernia, PUD, GERD  Medicated,  Endo/Other  Hypothyroidism   Renal/GU negative Renal ROS  negative genitourinary   Musculoskeletal   Abdominal   Peds  Hematology  (+) anemia ,   Anesthesia Other Findings   Reproductive/Obstetrics negative OB ROS                             Anesthesia Physical Anesthesia Plan  ASA: III  Anesthesia Plan: General   Post-op Pain Management: GA combined w/ Regional for post-op pain   Induction: Intravenous  PONV Risk Score and Plan: 4 or greater and Treatment may vary due to age or medical condition, Ondansetron, Dexamethasone and Midazolam  Airway Management Planned: Oral ETT and LMA  Additional Equipment:   Intra-op Plan:   Post-operative Plan: Extubation in OR  Informed Consent: I have reviewed the patients History and Physical, chart, labs and discussed the procedure including the risks, benefits and alternatives for the proposed anesthesia with the patient or authorized representative who has indicated his/her understanding and acceptance.   Dental Advisory Given  Plan Discussed with: CRNA, Anesthesiologist and Surgeon  Anesthesia Plan  Comments: (  )        Anesthesia Quick Evaluation

## 2017-08-22 NOTE — H&P (Signed)
Cheryl Perry Location: [facility] Patient #: [id number] DOB: 1945-04-08 Undefined / Language: Cleophus Molt / Race: White Female  History of Present Illness  Patient words: pt sent at the request of Dr Lisbeth Renshaw for abnormal screening mammogram. Pt underwent mammography which showed a 1.2 cm mass UOQ right breast core bx IDC grade 1 -2 ER PR pos her 2 neu negative with DCIS. She denies breast pain, nipple discharge or change in the appearance of her breasts.  The patient is a 72 year old female.   Past Surgical History  Breast Biopsy Right. Colon Polyp Removal - Colonoscopy Knee Surgery Right. Tonsillectomy  Diagnostic Studies History  Colonoscopy 1-5 years ago Mammogram within last year Pap Smear 1-5 years ago  Medication History  Medications Reconciled  Social History  Caffeine use Carbonated beverages, Tea. No alcohol use No drug use Tobacco use Former smoker.  Family History  Alcohol Abuse Family Members In General. Arthritis Family Members In General. Breast Cancer Family Members In General. Cerebrovascular Accident Family Members In General, Mother. Heart Disease Family Members In General. Hypertension Family Members In General, Mother, Sister. Respiratory Condition Family Members In General. Seizure disorder Son.  Pregnancy / Birth History  Age at menarche 20 years. Age of menopause <45 Contraceptive History Oral contraceptives. Gravida 2 Irregular periods Maternal age 79-20 Para 2  Other Problems  Asthma Back Pain Bladder Problems Breast Cancer Gastric Ulcer Gastroesophageal Reflux Disease General anesthesia - complications Hemorrhoids Home Oxygen Use Hypercholesterolemia Thyroid Disease Transfusion history     Review of Systems  General Not Present- Appetite Loss, Chills, Fatigue, Fever, Night Sweats, Weight Gain and Weight Loss. Skin Not Present- Change in Wart/Mole, Dryness,  Hives, Jaundice, New Lesions, Non-Healing Wounds, Rash and Ulcer. HEENT Present- Hearing Loss, Ringing in the Ears, Seasonal Allergies and Wears glasses/contact lenses. Not Present- Earache, Hoarseness, Nose Bleed, Oral Ulcers, Sinus Pain, Sore Throat, Visual Disturbances and Yellow Eyes. Respiratory Present- Snoring. Not Present- Bloody sputum, Chronic Cough, Difficulty Breathing and Wheezing. Breast Not Present- Breast Mass, Breast Pain, Nipple Discharge and Skin Changes. Cardiovascular Present- Rapid Heart Rate. Not Present- Chest Pain, Difficulty Breathing Lying Down, Leg Cramps, Palpitations, Shortness of Breath and Swelling of Extremities. Gastrointestinal Present- Abdominal Pain and Change in Bowel Habits. Not Present- Bloating, Bloody Stool, Chronic diarrhea, Constipation, Difficulty Swallowing, Excessive gas, Gets full quickly at meals, Hemorrhoids, Indigestion, Nausea, Rectal Pain and Vomiting. Female Genitourinary Present- Urgency. Not Present- Frequency, Nocturia, Painful Urination and Pelvic Pain. Musculoskeletal Present- Back Pain. Not Present- Joint Pain, Joint Stiffness, Muscle Pain, Muscle Weakness and Swelling of Extremities. Neurological Present- Numbness and Tingling. Not Present- Decreased Memory, Fainting, Headaches, Seizures, Tremor, Trouble walking and Weakness. Psychiatric Not Present- Anxiety, Bipolar, Change in Sleep Pattern, Depression, Fearful and Frequent crying. Endocrine Present- Hair Changes. Not Present- Cold Intolerance, Excessive Hunger, Heat Intolerance, Hot flashes and New Diabetes. Hematology Not Present- Blood Thinners, Easy Bruising, Excessive bleeding, Gland problems, HIV and Persistent Infections.   Physical Exam   General Mental Status-Alert. General Appearance-Consistent with stated age. Hydration-Well hydrated. Voice-Normal.  Head and Neck Head-normocephalic, atraumatic with no lesions or palpable  masses. Trachea-midline. Thyroid Gland Characteristics - normal size and consistency.  Eye Eyeball - Bilateral-Extraocular movements intact. Sclera/Conjunctiva - Bilateral-No scleral icterus.  Chest and Lung Exam Chest and lung exam reveals -quiet, even and easy respiratory effort with no use of accessory muscles and on auscultation, normal breath sounds, no adventitious sounds and normal vocal resonance. Inspection Chest Wall - Normal. Back - normal.  Breast  Note: swelling right breast no nipple discharge left breast normal  Cardiovascular Cardiovascular examination reveals -normal heart sounds, regular rate and rhythm with no murmurs and normal pedal pulses bilaterally.  Neurologic Neurologic evaluation reveals -alert and oriented x 3 with no impairment of recent or remote memory. Mental Status-Normal.  Musculoskeletal Normal Exam - Left-Upper Extremity Strength Normal and Lower Extremity Strength Normal. Normal Exam - Right-Upper Extremity Strength Normal and Lower Extremity Strength Normal.  Lymphatic Head & Neck  General Head & Neck Lymphatics: Bilateral - Description - Normal. Axillary  General Axillary Region: Bilateral - Description - Normal. Tenderness - Non Tender.    Assessment & Plan (Chiquitta Matty A. Jeanie Mccard MD; 07/17/2017 10:25 AM)  BREAST CANCER, RIGHT (C50.911) Impression: upper outer quadrant ER PR positive   Risk of lumpectomy include bleeding, infection, seroma, more surgery, use of seed/wire, wound care, cosmetic deformity and the need for other treatments, death , blood clots, death. Pt agrees to proceed. Risk of sentinel lymph node mapping include bleeding, infection, lymphedema, shoulder pain. stiffness, dye allergy. cosmetic deformity , blood clots, death, need for more surgery. Pt agres to proceed. Pt has opted for right breast lumpectomy and right SLN mapping  Current Plans You are being scheduled for surgery- Our  schedulers will call you.  You should hear from our office's scheduling department within 5 working days about the location, date, and time of surgery. We try to make accommodations for patient's preferences in scheduling surgery, but sometimes the OR schedule or the surgeon's schedule prevents [place] from making those accommodations.  If you have not heard from our office [phone]) in 5 working days, call the office and ask for your surgeon's nurse.  If you have other questions about your diagnosis, plan, or surgery, call the office and ask for your surgeon's nurse.  Pt Education - [facility] Breast Cancer Information Given - [facility] "Breast Journey" Package We discussed the staging and pathophysiology of breast cancer. We discussed all of the different options for treatment for breast cancer including surgery, chemotherapy, radiation therapy, Herceptin, and antiestrogen therapy. We discussed a sentinel lymph node biopsy as she does not appear to having lymph node involvement right now. We discussed the performance of that with injection of radioactive tracer and blue dye. We discussed that she would have an incision underneath her axillary hairline. We discussed that there is a bout a 10-20% chance of having a positive node with a sentinel lymph node biopsy and we will await the permanent pathology to make any other first further decisions in terms of her treatment. One of these options might be to return to the operating room to perform an axillary lymph node dissection. We discussed about a 1-2% risk lifetime of chronic shoulder pain as well as lymphedema associated with a sentinel lymph node biopsy. We discussed the options for treatment of the breast cancer which included lumpectomy versus a mastectomy. We discussed the performance of the lumpectomy with a wire placement. We discussed a 10-20% chance of a positive margin requiring reexcision in the operating room. We also discussed that she may need  radiation therapy or antiestrogen therapy or both if she undergoes lumpectomy. We discussed the mastectomy and the postoperative care for that as well. We discussed that there is no difference in her survival whether she undergoes lumpectomy with radiation therapy or antiestrogen therapy versus a mastectomy. There is a slight difference in the local recurrence rate being 3-5% with lumpectomy and about 1% with a mastectomy. We discussed the risks  of operation including bleeding, infection, possible reoperation. She understands her further therapy will be based on what her stages at the time of her operation.

## 2017-08-22 NOTE — Discharge Instructions (Signed)
[place] Office Phone Number [phone]  BREAST BIOPSY/ LUMPECTOMY: POST OP INSTRUCTIONS  Always review your discharge instruction sheet given to you by the facility where your surgery was performed.  IF YOU HAVE DISABILITY OR FAMILY LEAVE FORMS, YOU MUST BRING THEM TO THE OFFICE FOR PROCESSING.  DO NOT GIVE THEM TO YOUR DOCTOR.  1. A prescription for pain medication may be given to you upon discharge.  Take your pain medication as prescribed, if needed.  If narcotic pain medicine is not needed, then you may take acetaminophen (Tylenol) or ibuprofen (Advil) as needed. 2. Take your usually prescribed medications unless otherwise directed 3. If you need a refill on your pain medication, please contact your pharmacy.  They will contact our office to request authorization.  Prescriptions will not be filled after 5pm or on week-ends. 4. You should eat very light the first 24 hours after surgery, such as soup, crackers, pudding, etc.  Resume your normal diet the day after surgery. 5. Most patients will experience some swelling and bruising in the breast.  Ice packs and a good support bra will help.  Swelling and bruising can take several days to resolve.  6. It is common to experience some constipation if taking pain medication after surgery.  Increasing fluid intake and taking a stool softener will usually help or prevent this problem from occurring.  A mild laxative (Milk of Magnesia or Miralax) should be taken according to package directions if there are no bowel movements after 48 hours. 7. Unless discharge instructions indicate otherwise, you may remove your bandages 24-48 hours after surgery, and you may shower at that time.  You may have steri-strips (small skin tapes) in place directly over the incision.  These strips should be left on the skin for 7-10 days.  If your surgeon used skin glue on the incision, you may shower in 24 hours.  The glue will flake off over the next 2-3  weeks.  Any sutures or staples will be removed at the office during your follow-up visit. 8. ACTIVITIES:  You may resume regular daily activities (gradually increasing) beginning the next day.  Wearing a good support bra or sports bra minimizes pain and swelling.  You may have sexual intercourse when it is comfortable. a. You may drive when you no longer are taking prescription pain medication, you can comfortably wear a seatbelt, and you can safely maneuver your car and apply brakes. b. RETURN TO WORK:  ______________________________________________________________________________________ 9. You should see your doctor in the office for a follow-up appointment approximately two weeks after your surgery.  Your doctors nurse will typically make your follow-up appointment when she calls you with your pathology report.  Expect your pathology report 2-3 business days after your surgery.  You may call to check if you do not hear from [place] after three days. 10. OTHER INSTRUCTIONS: _______________________________________________________________________________________________ _____________________________________________________________________________________________________________________________________ _____________________________________________________________________________________________________________________________________ _____________________________________________________________________________________________________________________________________  WHEN TO CALL YOUR DOCTOR: 1. Fever over 101.0 2. Nausea and/or vomiting. 3. Extreme swelling or bruising. 4. Continued bleeding from incision. 5. Increased pain, redness, or drainage from the incision.  The clinic staff is available to answer your questions during regular business hours.  Please dont hesitate to call and ask to speak to one of the nurses for clinical concerns.  If you have a medical emergency, go to the nearest emergency  room or call 911.  A surgeon from [facility] is always on call at the hospital.  For further questions, please visit [email or web address]  Post Anesthesia Home Care Instructions  Activity: Get plenty of rest for the remainder of the day. A responsible individual must stay with you for 24 hours following the procedure.  For the next 24 hours, DO NOT: -Drive a car -Paediatric nurse -Drink alcoholic beverages -Take any medication unless instructed by your physician -Make any legal decisions or sign important papers.  Meals: Start with liquid foods such as gelatin or soup. Progress to regular foods as tolerated. Avoid greasy, spicy, heavy foods. If nausea and/or vomiting occur, drink only clear liquids until the nausea and/or vomiting subsides. Call your physician if vomiting continues.  Special Instructions/Symptoms: Your throat may feel dry or sore from the anesthesia or the breathing tube placed in your throat during surgery. If this causes discomfort, gargle with warm salt water. The discomfort should disappear within 24 hours.  If you had a scopolamine patch placed behind your ear for the management of post- operative nausea and/or vomiting:  1. The medication in the patch is effective for 72 hours, after which it should be removed.  Wrap patch in a tissue and discard in the trash. Wash hands thoroughly with soap and water. 2. You may remove the patch earlier than 72 hours if you experience unpleasant side effects which may include dry mouth, dizziness or visual disturbances. 3. Avoid touching the patch. Wash your hands with soap and water after contact with the patch.

## 2017-08-22 NOTE — Interval H&P Note (Signed)
History and Physical Interval Note:  08/22/2017 12:27 PM  Cheryl Perry  has presented today for surgery, with the diagnosis of RIGHT BREAST CANCER  The various methods of treatment have been discussed with the patient and family. After consideration of risks, benefits and other options for treatment, the patient has consented to  Procedure(s): BREAST LUMPECTOMY WITH RADIOACTIVE SEED AND SENTINEL LYMPH NODE BIOPSY (Right) as a surgical intervention .  The patient's history has been reviewed, patient examined, no change in status, stable for surgery.  I have reviewed the patient's chart and labs.  Questions were answered to the patient's satisfaction.     [place]

## 2017-08-22 NOTE — Anesthesia Procedure Notes (Signed)
Anesthesia Regional Block: Pectoralis block   Pre-Anesthetic Checklist: ,, timeout performed, Correct Patient, Correct Site, Correct Laterality, Correct Procedure, Correct Position, site marked, Risks and benefits discussed,  Surgical consent,  Pre-op evaluation,  At surgeon's request and post-op pain management  Laterality: Right  Prep: chloraprep       Needles:  Injection technique: Single-shot  Needle Type: Echogenic Stimulator Needle     Needle Length: 5cm  Needle Gauge: 22     Additional Needles:   Procedures:, nerve stimulator,,, ultrasound used (permanent image in chart),,,,  Narrative:  Start time: 08/22/2017 11:00 AM End time: 08/22/2017 11:09 AM Injection made incrementally with aspirations every 5 mL.  Performed by: Personally  Anesthesiologist: Janeece Riggers, MD  Additional Notes: Functioning IV was confirmed and monitors were applied.  A 4mm 22ga Arrow echogenic stimulator needle was used. Sterile prep and drape,hand hygiene and sterile gloves were used. Ultrasound guidance: relevant anatomy identified, needle position confirmed, local anesthetic spread visualized around nerve(s)., vascular puncture avoided.  Image printed for medical record. Negative aspiration and negative test dose prior to incremental administration of local anesthetic. The patient tolerated the procedure well.

## 2017-08-22 NOTE — Progress Notes (Signed)
Assisted Dr. Ambrose Pancoast with right, ultrasound guided, pectoralis block and nuc med tech with nuc med inj. Side rails up, monitors on throughout procedure. See vital signs in flow sheet. Tolerated Procedure well.

## 2017-08-22 NOTE — Op Note (Signed)
Preoperative diagnosis: Stage I right breast cancer upper outer quadrant  Postoperative diagnosis: Same  Procedure: Right breast seed localized lumpectomy with right axillary sentinel lymph node mapping of deep right axillary lymph node basin  Surgeon: Erroll Luna, MD  Anesthesia: LMA with pectoral block and local  EBL: 10 cc  Specimen:Right breast mass with seed and clip verified by Faxitron and additional posterior margin and 2 right axillary sentinel nodes hot  Drains: None  IV fluids: Per anesthesia record  Indications for procedure: The patient presents for right breast lumpectomy after recent mammographic findings of a right breast mass upper outer quadrant core biopsy proven to be grade 1 to grade 2 invasive ductal carcinoma ER positive PR positive HER-2/neu negative.  She opted for breast conservation.  Risk, benefits and other surgical options discussed with the patient and potential future treatment options discussed.The procedure has been discussed with the patient. Alternatives to surgery have been discussed with the patient.  Risks of surgery include bleeding,  Infection,  Seroma formation, death,  and the need for further surgery.   The patient understands and wishes to proceed.Sentinel lymph node mapping and dissection has been discussed with the patient.  Risk of bleeding,  Infection,  Seroma formation,  Additional procedures,,  Shoulder weakness ,  Shoulder stiffness,  Nerve and blood vessel injury and reaction to the mapping dyes have been discussed.  Alternatives to surgery have been discussed with the patient.  The patient agrees to proceed.     Description of procedure: The patient was met in the holding area.  Right breast was marked as the correct side and Faxitron used to verify seed location.  The patient underwent a pectoral block per anesthesia.  Questions were answered.  She underwent injection of technetium sulfur colloid in the right breast per nuclear  medicine.  She was taken back to the operating room.  She was placed supine upon the operating table.  After induction of general anesthesia, the right breast was prepped and draped in sterile fashion timeout was done.  The neoprobe was used and the seed was identified in the right breast upper outer quadrant.  The films were available for review.  Curvilinear incision was made in the right upper quadrant of the breast.  Dissection was carried down all tissue around the seed and clip were excised with grossly negative margin.  Faxitron image reviewed.  The deep margin of the close therefore took additional tissue.  The cavity was made hemostatic.  Clips were placed.  The cavity was closed with 3-0 Vicryl and 4-0 Monocryl.  Dermabond applied.  The neoprobe settings were changed to technetium.  The node was identified in the right axilla and a 4 cm incision was made in the right axilla.  Dissection was carried down into the deep level 1 axillary drainage basin.  The neoprobe was used and 2 hot nodes were identified and removed.  Background counts approaches 0.  Irrigation was used and there is no evidence of bleeding.  Wound was closed with 3-0 Vicryl and 4-0 Monocryl.  Dermabond applied to both incisions.  All final counts found to be correct sponge, needles and instruments.  The patient was awoke extubated taken to recovery in satisfactory condition.

## 2017-08-22 NOTE — Anesthesia Postprocedure Evaluation (Signed)
Anesthesia Post Note  Patient: Cheryl Perry  Procedure(s) Performed: BREAST LUMPECTOMY WITH RADIOACTIVE SEED AND SENTINEL LYMPH NODE BIOPSY (Right Breast)     Patient location during evaluation: PACU Anesthesia Type: General and Regional Level of consciousness: awake and alert Pain management: pain level controlled Vital Signs Assessment: post-procedure vital signs reviewed and stable Respiratory status: spontaneous breathing, nonlabored ventilation, respiratory function stable and patient connected to nasal cannula oxygen Cardiovascular status: blood pressure returned to baseline and stable Postop Assessment: no apparent nausea or vomiting Anesthetic complications: no    Last Vitals:  Vitals:   08/22/17 1515 08/22/17 1530  BP: 139/73 (!) 151/83  Pulse: 63 (!) 56  Resp: 15 16  Temp:  36.5 C  SpO2: 96% 98%    Last Pain:  Vitals:   08/22/17 1530  TempSrc:   PainSc: 0-No pain                 Montez Hageman

## 2017-08-22 NOTE — Anesthesia Procedure Notes (Signed)
Procedure Name: LMA Insertion Date/Time: 08/22/2017 1:06 PM Performed by: Maryella Shivers, CRNA Pre-anesthesia Checklist: Patient identified, Emergency Drugs available, Suction available and Patient being monitored Patient Re-evaluated:Patient Re-evaluated prior to induction Oxygen Delivery Method: Circle system utilized Preoxygenation: Pre-oxygenation with 100% oxygen Induction Type: IV induction Ventilation: Mask ventilation without difficulty LMA: LMA inserted LMA Size: 4.0 Number of attempts: 1 Airway Equipment and Method: Bite block Placement Confirmation: positive ETCO2 Tube secured with: Tape Dental Injury: Teeth and Oropharynx as per pre-operative assessment

## 2017-08-22 NOTE — Transfer of Care (Signed)
Immediate Anesthesia Transfer of Care Note  Patient: Cheryl Perry  Procedure(s) Performed: BREAST LUMPECTOMY WITH RADIOACTIVE SEED AND SENTINEL LYMPH NODE BIOPSY (Right Breast)  Patient Location: PACU  Anesthesia Type:General  Level of Consciousness: drowsy  Airway & Oxygen Therapy: Patient Spontanous Breathing and Patient connected to face mask oxygen  Post-op Assessment: Report given to RN and Post -op Vital signs reviewed and stable  Post vital signs: Reviewed and stable  Last Vitals:  Vitals Value Taken Time  BP 105/64 08/22/2017  1:56 PM  Temp    Pulse 66 08/22/2017  1:58 PM  Resp 12 08/22/2017  1:58 PM  SpO2 100 % 08/22/2017  1:58 PM  Vitals shown include unvalidated device data.  Last Pain:  Vitals:   08/22/17 1016  TempSrc: Oral  PainSc: 0-No pain         Complications: No apparent anesthesia complications

## 2017-08-23 ENCOUNTER — Encounter (HOSPITAL_BASED_OUTPATIENT_CLINIC_OR_DEPARTMENT_OTHER): Payer: Self-pay | Admitting: Surgery

## 2017-08-29 ENCOUNTER — Inpatient Hospital Stay: Payer: Medicare Other | Attending: Hematology and Oncology | Admitting: Hematology and Oncology

## 2017-08-29 DIAGNOSIS — C50411 Malignant neoplasm of upper-outer quadrant of right female breast: Secondary | ICD-10-CM | POA: Insufficient documentation

## 2017-08-29 DIAGNOSIS — Z170 Estrogen receptor positive status [ER+]: Secondary | ICD-10-CM | POA: Insufficient documentation

## 2017-08-29 NOTE — Assessment & Plan Note (Signed)
08/22/2017:Right lumpectomy: IDC grade 1, 1.3 cm, margins negative, negative for lymphovascular or perineural invasion, 0/2 lymph nodes negative, ER 100%, PR 100%, Ki-67 2%, HER-2 negative ratio 1.29 T1CN0 stage Ia  Pathology counseling: I discussed the final pathology report of the patient provided  a copy of this report. I discussed the margins as well as lymph node surgeries. We also discussed the final staging along with previously performed ER/PR and HER-2/neu testing.  Treatment plan: 1. Oncotype DX testing to determine if chemotherapy would be of any benefit followed by 2. Adjuvant radiation therapy followed by 3. Adjuvant antiestrogen therapy  Return to clinic based upon Oncotype DX testing

## 2017-08-29 NOTE — Progress Notes (Signed)
Patient Care Team: Sharion Balloon, FNP as PCP - General (Family Medicine) Clent Jacks, MD as Consulting Physician (Ophthalmology) Gala Romney, Cristopher Estimable, MD as Consulting Physician (Gastroenterology) Suella Broad, MD as Consulting Physician (Physical Medicine and Rehabilitation) Iran Planas, MD as Consulting Physician (Orthopedic Surgery) Richmond Campbell, MD as Consulting Physician (Gastroenterology) Nicholas Lose, MD as Consulting Physician (Hematology and Oncology) Erroll Luna, MD as Consulting Physician (General Surgery) Kyung Rudd, MD as Consulting Physician (Radiation Oncology)  DIAGNOSIS:  Encounter Diagnosis  Name Primary?  . Malignant neoplasm of upper-outer quadrant of right breast in female, estrogen receptor positive ([place])     SUMMARY OF ONCOLOGIC HISTORY:   Malignant neoplasm of upper-outer quadrant of right breast in female, estrogen receptor positive ([place])   07/08/2017 Initial Diagnosis    Screening detected right breast mass 1.2 cm at 10 o'clock position 6 cm from the nipple axilla negative: Biopsy revealed invasive ductal carcinoma grade 1-2 with a low-grade DCIS, ER 100%, PR 100%, Ki-67 2%, HER-2 negative ratio 1.29, T1CN0 stage Ia AJCC 8    08/22/2017 Surgery    Right lumpectomy: IDC grade 1, 1.3 cm, margins negative, negative for lymphovascular or perineural invasion, 0/2 lymph nodes negative, ER 100%, PR 100%, Ki-67 2%, HER-2 negative ratio 1.29 T1CN0 stage Ia     CHIEF COMPLIANT: Follow-up after lumpectomy to discuss pathology report  INTERVAL HISTORY: Cheryl Perry is a 72 year old with above-mentioned history of right breast cancer treated with lumpectomy and is here today to discuss the pathology report.  She has done extremely well from surgery standpoint.  She does not have any pain or discomfort.  She is planning to see physical therapist for range of motion exercises.  REVIEW OF SYSTEMS:   Constitutional: Denies fevers, chills or abnormal weight  loss Eyes: Denies blurriness of vision Ears, nose, mouth, throat, and face: Denies mucositis or sore throat Respiratory: Denies cough, dyspnea or wheezes Cardiovascular: Denies palpitation, chest discomfort Gastrointestinal:  Denies nausea, heartburn or change in bowel habits Skin: Denies abnormal skin rashes Lymphatics: Denies new lymphadenopathy or easy bruising Neurological:Denies numbness, tingling or new weaknesses Behavioral/Psych: Mood is stable, no new changes  Extremities: No lower extremity edema Breast: Recent right lumpectomy All other systems were reviewed with the patient and are negative.  I have reviewed the past medical history, past surgical history, social history and family history with the patient and they are unchanged from previous note.  ALLERGIES:  is allergic to naproxen; ampicillin; avelox [moxifloxacin hcl in nacl]; bactrim [sulfamethoxazole-trimethoprim]; levaquin [levofloxacin in d5w]; lipitor [atorvastatin]; macrodantin [nitrofurantoin macrocrystal]; premarin [conjugated estrogens]; trental [pentoxifylline er]; and livalo [pitavastatin].  MEDICATIONS:  Current Outpatient Medications  Medication Sig Dispense Refill  . albuterol (PROVENTIL HFA) 108 (90 BASE) MCG/ACT inhaler Inhale 2 puffs into the lungs every 6 (six) hours as needed for wheezing. May use only 3 times a month    . B Complex Vitamins (VITAMIN B COMPLEX PO) Take 1 tablet by mouth daily.    Marland Kitchen CALCIUM-MAGNESIUM-ZINC PO Take 1 tablet by mouth daily.    . Cholecalciferol (VITAMIN D) 2000 UNITS CAPS Take 1 capsule by mouth daily.    Marland Kitchen EPINEPHrine 0.3 mg/0.3 mL IJ SOAJ injection See admin instructions.  0  . fluticasone-salmeterol (ADVAIR HFA) 45-21 MCG/ACT inhaler Inhale 2 puffs into the lungs 2 (two) times daily. 3 Inhaler 3  . HYDROcodone-acetaminophen (NORCO/VICODIN) 5-325 MG tablet Take 1 tablet by mouth every 6 (six) hours as needed for moderate pain. 10 tablet 0  . levothyroxine (SYNTHROID,  LEVOTHROID) 75 MCG tablet TAKE 1 TABLET (75 MCG TOTAL)  BY MOUTH DAILY BEFORE BREAKFAST. 90 tablet 3  . loratadine (CLARITIN) 10 MG tablet Take 10 mg by mouth daily.    . methocarbamol (ROBAXIN) 500 MG tablet Take 1 tablet (500 mg total) by mouth 3 (three) times daily. (Patient taking differently: Take 500 mg by mouth every 8 (eight) hours as needed for muscle spasms. ) 90 tablet 1  . montelukast (SINGULAIR) 10 MG tablet Take 10 mg by mouth at bedtime.    . pantoprazole (PROTONIX) 40 MG tablet Take 1 tablet (40 mg total) by mouth daily before breakfast. 90 tablet 3  . Probiotic Product (PROBIOTIC ADVANCED PO) Take 1 capsule by mouth daily.     . rosuvastatin (CRESTOR) 5 MG tablet TAKE 1 TABLET EVERY WEEK TO BEGIN 30 tablet 0   No current facility-administered medications for this visit.     PHYSICAL EXAMINATION: ECOG PERFORMANCE STATUS: 1 - Symptomatic but completely ambulatory  Vitals:   08/29/17 1411  BP: 140/82  Pulse: 66  Resp: 16  Temp: 98.1 F (36.7 C)  SpO2: 99%   Filed Weights   08/29/17 1411  Weight: 173 lb 8 oz (78.7 kg)    GENERAL:alert, no distress and comfortable SKIN: skin color, texture, turgor are normal, no rashes or significant lesions EYES: normal, Conjunctiva are pink and non-injected, sclera clear OROPHARYNX:no exudate, no erythema and lips, buccal mucosa, and tongue normal  NECK: supple, thyroid normal size, non-tender, without nodularity LYMPH:  no palpable lymphadenopathy in the cervical, axillary or inguinal LUNGS: clear to auscultation and percussion with normal breathing effort HEART: regular rate & rhythm and no murmurs and no lower extremity edema ABDOMEN:abdomen soft, non-tender and normal bowel sounds MUSCULOSKELETAL:no cyanosis of digits and no clubbing  NEURO: alert & oriented x 3 with fluent speech, no focal motor/sensory deficits EXTREMITIES: No lower extremity edema   LABORATORY DATA:  I have reviewed the data as listed CMP Latest Ref Rng  & Units 07/17/2017 06/17/2017 12/14/2016  Glucose 70 - 99 mg/dL 94 34(LL) 89  BUN 8 - 23 mg/dL _0 Creatinine 0.44 - 1.00 mg/dL 0.85 0.76 0.77  Sodium 135 - 145 mmol/L 141 142 138  Potassium 3.5 - 5.1 mmol/L 4.1 4.7 4.6  Chloride 98 - 111 mmol/L 106 101 102  CO2 22 - 32 mmol/L _1 Calcium 8.9 - 10.3 mg/dL 9.4 9.3 9.7  Total Protein 6.5 - 8.1 g/dL 6.6 6.3 6.1  Total Bilirubin 0.3 - 1.2 mg/dL 0.4 <0.2 0.3  Alkaline Phos 38 - 126 U/L 78 66 74  AST 15 - 41 U/L _2 ALT 0 - 44 U/L _3 Lab Results  Component Value Date   WBC 5.8 07/17/2017   HGB 14.6 07/17/2017   HCT 44.7 07/17/2017   MCV 99.3 07/17/2017   PLT 205 07/17/2017   NEUTROABS 2.9 07/17/2017    ASSESSMENT & PLAN:  Malignant neoplasm of upper-outer quadrant of right breast in female, estrogen receptor positive ([facility]) 08/22/2017:Right lumpectomy: IDC grade 1, 1.3 cm, margins negative, negative for lymphovascular or perineural invasion, 0/2 lymph nodes negative, ER 100%, PR 100%, Ki-67 2%, HER-2 negative ratio 1.29 T1CN0 stage Ia  Pathology counseling: I discussed the final pathology report of the patient provided  a copy of this report. I discussed the margins as well as lymph node surgeries. We also discussed the final staging along with previously performed ER/PR and HER-2/neu  testing.  Treatment plan: 1. Oncotype DX testing to determine if chemotherapy would be of any benefit followed by 2. Adjuvant radiation therapy followed by 3. Adjuvant antiestrogen therapy  Return to clinic based upon Oncotype DX testing  No orders of the defined types were placed in this encounter.  The patient has a good understanding of the overall plan. she agrees with it. she will call with any problems that may develop before the next visit here.   Harriette Ohara, MD 08/29/17

## 2017-08-30 ENCOUNTER — Telehealth: Payer: Self-pay | Admitting: Hematology and Oncology

## 2017-08-30 ENCOUNTER — Telehealth: Payer: Self-pay | Admitting: *Deleted

## 2017-08-30 NOTE — Telephone Encounter (Signed)
Per 8/22 los, no orders or referrals

## 2017-08-30 NOTE — Telephone Encounter (Signed)
Received order for oncotype testing. Requisition faxed to pathology. Received by Keisha 

## 2017-09-04 DIAGNOSIS — J3081 Allergic rhinitis due to animal (cat) (dog) hair and dander: Secondary | ICD-10-CM | POA: Diagnosis not present

## 2017-09-04 DIAGNOSIS — J301 Allergic rhinitis due to pollen: Secondary | ICD-10-CM | POA: Diagnosis not present

## 2017-09-04 DIAGNOSIS — J3089 Other allergic rhinitis: Secondary | ICD-10-CM | POA: Diagnosis not present

## 2017-09-06 ENCOUNTER — Telehealth: Payer: Self-pay | Admitting: *Deleted

## 2017-09-06 DIAGNOSIS — C50411 Malignant neoplasm of upper-outer quadrant of right female breast: Secondary | ICD-10-CM

## 2017-09-06 DIAGNOSIS — Z170 Estrogen receptor positive status [ER+]: Principal | ICD-10-CM

## 2017-09-06 NOTE — Telephone Encounter (Signed)
Received oncotype score of 0/3%. Physician team notified. Called pt with results and discussed chemo is not recommended. Informed next step is xrt with Dr. Lisbeth Renshaw. Referral placed.

## 2017-09-10 ENCOUNTER — Encounter: Payer: Self-pay | Admitting: Hematology and Oncology

## 2017-09-11 ENCOUNTER — Encounter (HOSPITAL_COMMUNITY): Payer: Self-pay | Admitting: Hematology and Oncology

## 2017-09-12 NOTE — Progress Notes (Signed)
Location of Breast Cancer:Upper-outer quadrant of right breast in female  Histology per Pathology Report:  ADDITIONAL INFORMATION:07-08-17 PROGNOSTIC INDICATORS Results: IMMUNOHISTOCHEMICAL AND MORPHOMETRIC ANALYSIS PERFORMED MANUALLY. OF NOTE, THERE IS MINIMAL TUMOR PRESENT FOR EVALUATION. Estrogen Receptor: 100%, POSITIVE, STRONG STAINING INTENSITY Progesterone Receptor: 100%, POSITIVE, STRONG STAINING INTENSITY Proliferation Marker Ki67: 2% FLUORESCENCE IN-SITU HYBRIDIZATION Results: HER2 - NEGATIVE RATIO OF HER2/CEP17 SIGNALS 1.29 AVERAGE HER2 COPY NUMBER PER CELL 2.00  FINAL DIAGNOSIS Diagnosis 07-08-17 Breast, right, needle core biopsy, 10 o'clock, 6cmfn - INVASIVE DUCTAL CARCINOMA, GRADE I-II. SEE NOTE. - DUCTAL CARCINOMA IN SITU (DCIS), LOW NUCLEAR GRADE.  Dr. Lyndon Code has reviewed this case and concurs with the above interpretation. Breast prognostic profile is pending and will be reported in an addendum. Dr. Luan Pulling was notified on 07/09/2017. (NK:ah 07/09/17) Jaquita Folds MD Pathologist, Electronic Signature (Case signed 07/09/2017) Specimen Receptor Status: ER(100 % +), PR (100 % +), Her2-neu (- 1.29 ratio), Ki-(2 %)  Did patient present with symptoms (if so, please note symptoms) or was this found on screening mammography?: Screening detected mass in the right breast  Past/Anticipated interventions by surgeon, if any:  FINAL DIAGNOSIS Diagnosis 08-22-17 Dr. Marcello Moores Cornett 1. Breast, lumpectomy, right w/seed - INVASIVE DUCTAL CARCINOMA, GRADE I, 1.3 CM. - RESECTION MARGINS ARE NEGATIVE FOR CARCINOMA. - NEGATIVE FOR LYMPHOVASCULAR OR PERINEURAL INVASION. - SEE ONCOLOGY. - SEE NOTE. 2. Breast, excision, right posterior margin - BENIGN BREAST PARENCHYMA. - NEGATIVE FOR CARCINOMA. 3. Lymph node, sentinel, biopsy, right axillary - LYMPH NODE, NEGATIVE FOR CARCINOMA (0/1). 4. Lymph node, sentinel, biopsy, right - LYMPH NODE, NEGATIVE FOR CARCINOMA  (0/1). Microscopic Comment 1. INVASIVE CARCINOMA OF THE BREAST: Resection Procedure: Breast lumpectomy. Specimen Laterality: Right.  Receptor Status: ER(100 % +), PR (100 % +), Her2-neu (- 1.29 ratio), Ki-(2 %)   Past/Anticipated interventions by medical oncology, if any: Dr. Lindi Adie   Chemotherapy No  1. Oncotype DX testing score result zero     2. Adjuvant radiation therapy followed by 3. Adjuvant antiestrogen therapy   Lymphedema issues, if any: No  ROM of right arm good. Saw PT on Tuesday 09-17-17 received home exercises for stretching. Skin to right breast healing bruising decreasing incision areas look good clean and dry with surgerical glue axilla and above the nipple. Follow up appointment to see Dr. Erroll Luna 09-06-17  Pain issues, if any: No  SAFETY ISSUES:No  Prior radiation? : No  Pacemaker/ICD? :No  Possible current pregnancy?:No  Is the patient on methotrexate? :No   Menarche 11 G2 P2 BCyes LMP85 Menopause36 HRTyes 4 years   Current Complaints / other details:   Wt Readings from Last 3 Encounters:  09/19/17 175 lb 6.4 oz (79.6 kg)  08/29/17 173 lb 8 oz (78.7 kg)  08/22/17 171 lb 8 oz (77.8 kg)  BP (!) 125/59 (BP Location: Left Arm, Patient Position: Sitting)   Pulse (!) 50   Temp 98.1 F (36.7 C) (Oral)   Resp 20   Ht 5' 6"  (1.676 m)   Wt 175 lb 6.4 oz (79.6 kg)   SpO2 99%   BMI 28.31 kg/m     Georgena Spurling, RN 09/12/2017,9:12 AM

## 2017-09-13 DIAGNOSIS — J301 Allergic rhinitis due to pollen: Secondary | ICD-10-CM | POA: Diagnosis not present

## 2017-09-13 DIAGNOSIS — J3089 Other allergic rhinitis: Secondary | ICD-10-CM | POA: Diagnosis not present

## 2017-09-13 DIAGNOSIS — J3081 Allergic rhinitis due to animal (cat) (dog) hair and dander: Secondary | ICD-10-CM | POA: Diagnosis not present

## 2017-09-16 ENCOUNTER — Ambulatory Visit: Payer: Medicare Other | Admitting: Physical Therapy

## 2017-09-16 ENCOUNTER — Ambulatory Visit: Payer: Medicare Other | Attending: Surgery | Admitting: Physical Therapy

## 2017-09-16 ENCOUNTER — Other Ambulatory Visit: Payer: Self-pay

## 2017-09-16 ENCOUNTER — Encounter: Payer: Self-pay | Admitting: Physical Therapy

## 2017-09-16 DIAGNOSIS — R293 Abnormal posture: Secondary | ICD-10-CM | POA: Diagnosis not present

## 2017-09-16 DIAGNOSIS — Z170 Estrogen receptor positive status [ER+]: Secondary | ICD-10-CM | POA: Insufficient documentation

## 2017-09-16 DIAGNOSIS — C50211 Malignant neoplasm of upper-inner quadrant of right female breast: Secondary | ICD-10-CM | POA: Insufficient documentation

## 2017-09-16 DIAGNOSIS — Z483 Aftercare following surgery for neoplasm: Secondary | ICD-10-CM | POA: Diagnosis not present

## 2017-09-16 NOTE — Patient Instructions (Signed)
Closed Chain: Shoulder Flexion / Extension - on Wall    Hands on wall, step backward. Return. Stepping causes shoulder flexion and extension Do _5__ times, holding 5 seconds, 2 times per day.  http://ss.exer.us/265   Copyright  VHI. All rights reserved.  Closed Chain: Shoulder Abduction / Adduction - on Wall    One hand on wall, step to side and return. Stepping causes shoulder to abduct and adduct. Do _5__ times, holding 5 seconds, 2 times per day. http://ss.exer.us/267   Copyright  VHI. All rights reserved.

## 2017-09-16 NOTE — Therapy (Signed)
[place], [place], [address] Phone: [phone]   Fax:  [phone]  Physical Therapy Treatment  Patient Details  Name: Cheryl Perry MRN: [id number] Date of Birth: 17-Dec-1945 Referring Provider: Dr. Erroll Luna   Encounter Date: 09/16/2017  PT End of Session - 09/16/17 1355    Visit Number  2    Number of Visits  2    PT Start Time  4888    PT Stop Time  1420    PT Time Calculation (min)  35 min       Past Medical History:  Diagnosis Date  . Allergy   . Arthritis    back painherniated disc lumbar  . Asthma   . Back pain   . Cancer ([place]) 07/2017   right breast cancer  . Complication of anesthesia   . GERD (gastroesophageal reflux disease)   . Hiatal hernia   . Hyperlipidemia   . Hypothyroidism   . Osteopenia   . PONV (postoperative nausea and vomiting)    asks for scop patch  . Thyroid disease   . Vasculopathy    lymphatic    Past Surgical History:  Procedure Laterality Date  . BREAST LUMPECTOMY WITH RADIOACTIVE SEED AND SENTINEL LYMPH NODE BIOPSY Right 08/22/2017   Procedure: BREAST LUMPECTOMY WITH RADIOACTIVE SEED AND SENTINEL LYMPH NODE BIOPSY;  Surgeon: Erroll Luna, MD;  Location: [facility];  Service: General;  Laterality: Right;  . COLONOSCOPY    . CYSTOSCOPY    . cystscopy    . ESOPHAGOGASTRODUODENOSCOPY N/A 01/28/2015   Dr.Rourk- 3 small prepyloric/gastric ulcers- likely culprits causing bleeding. hiatal hernia, small gastic polyps not manipulated.  . ESOPHAGOGASTRODUODENOSCOPY N/A 05/26/2015   Dr. Gala Romney: Small hiatal hernia, previous gastric ulcers completely healed.  Marland Kitchen KNEE SURGERY    . TONSILLECTOMY    . TUBAL LIGATION      There were no vitals filed for this visit.  Subjective Assessment - 09/16/17 1343    Subjective  Patient underwent a right lumpectomy and sentinel node biopsy (0/2 nodes positive) on 08/22/17. Her Oncotype score was zero. She  will begin radiation soon.     Pertinent History  Patient was diagnosed on 07/01/17 with right grade I-II Invasive ductal carcinoma breast cancer. It is ER/PR positive and HER2 negative with a Ki67 of 2%. Patient underwent a right lumpectomy and sentinel node biopsy (0/2 nodes positive) on 08/22/17. She has a pelvic prolapse and asthma. She also has chronic back pain.    Patient Stated Goals  See if arm is ok    Currently in Pain?  Yes    Pain Score  1     Pain Location  Axilla    Pain Orientation  Right    Pain Descriptors / Indicators  Tender    Pain Type  Surgical pain    Pain Onset  1 to 4 weeks ago    Pain Frequency  Intermittent    Aggravating Factors   Laying on right side    Pain Relieving Factors  Unknown    Multiple Pain Sites  No         [facility] PT Assessment - 09/16/17 0001      Assessment   Medical Diagnosis  Right breast cancer (s/p right lumpectomy SLNB)    Referring Provider  Dr. Marcello Moores Cornett    Onset Date/Surgical Date  08/22/17    Hand Dominance  Right    Prior Therapy  Baselines  Precautions   Precautions  Other (comment)    Precaution Comments  recent right breast surgery; right arm lymphedema risk      Restrictions   Weight Bearing Restrictions  No      Balance Screen   Has the patient fallen in the past 6 months  No    Has the patient had a decrease in activity level because of a fear of falling?   No    Is the patient reluctant to leave their home because of a fear of falling?   No      Home Film/video editor residence    Living Arrangements  Spouse/significant other    Available Help at Discharge  Family      Prior Function   Level of [place]  Retired    Leisure  Patient has been walking 30 min/day every day      Cognition   Overall Cognitive Status  Within Functional Limits for tasks assessed      Observation/Other Assessments   Observations  Right breast and axillary incisions both  appear to be healing well; surgical glue still present but no sign of infection.      Posture/Postural Control   Posture/Postural Control  Postural limitations    Postural Limitations  Rounded Shoulders;Forward head;Increased thoracic kyphosis      ROM / Strength   AROM / PROM / Strength  AROM      AROM   AROM Assessment Site  Shoulder    Right/Left Shoulder  Right    Right Shoulder Extension  49 Degrees    Right Shoulder Flexion  120 Degrees    Right Shoulder ABduction  146 Degrees    Right Shoulder Internal Rotation  67 Degrees    Right Shoulder External Rotation  72 Degrees      Strength   Overall Strength  Within functional limits for tasks performed        LYMPHEDEMA/ONCOLOGY QUESTIONNAIRE - 09/16/17 1348      Type   Cancer Type  Right breast cancer      Surgeries   Lumpectomy Date  08/22/17    Sentinel Lymph Node Biopsy Date  08/22/17    Number Lymph Nodes Removed  2      Treatment   Active Chemotherapy Treatment  No    Past Chemotherapy Treatment  No    Active Radiation Treatment  No    Past Radiation Treatment  No    Current Hormone Treatment  No    Past Hormone Therapy  No      What other symptoms do you have   Are you Having Heaviness or Tightness  No    Are you having Pain  Yes    Are you having pitting edema  No    Is it Hard or Difficult finding clothes that fit  No    Do you have infections  No    Is there Decreased scar mobility  No    Stemmer Sign  No      Lymphedema Assessments   Lymphedema Assessments  Upper extremities      Right Upper Extremity Lymphedema   10 cm Proximal to Olecranon Process  32.5 cm    Olecranon Process  24.8 cm    10 cm Proximal to Ulnar Styloid Process  22.9 cm    Just Proximal to Ulnar Styloid Process  15.5 cm    Across Hand at [employer]  Space  17.9 cm    At [place] of 2nd Digit  5.8 cm      Left Upper Extremity Lymphedema   10 cm Proximal to Olecranon Process  31 cm    Olecranon Process  25 cm    10 cm Proximal  to Ulnar Styloid Process  21.9 cm    Just Proximal to Ulnar Styloid Process  15.1 cm    Across Hand at [employer]  17.5 cm    At [place] of 2nd Digit  5.6 cm        Quick Dash - 09/16/17 0001    Open a tight or new jar  Moderate difficulty    Do heavy household chores (wash walls, wash floors)  Moderate difficulty    Carry a shopping bag or briefcase  Mild difficulty    Wash your back  Moderate difficulty    Use a knife to cut food  No difficulty    Recreational activities in which you take some force or impact through your arm, shoulder, or hand (golf, hammering, tennis)  Moderate difficulty    During the past week, to what extent has your arm, shoulder or hand problem interfered with your normal social activities with family, friends, neighbors, or groups?  Slightly    During the past week, to what extent has your arm, shoulder or hand problem limited your work or other regular daily activities  Slightly    Arm, shoulder, or hand pain.  Mild    Tingling (pins and needles) in your arm, shoulder, or hand  Mild    Difficulty Sleeping  Mild difficulty    DASH Score  31.82 %                     PT Education - 09/16/17 1354    Education Details  Shoulder ROM on wall to gain last new degrees of flexion/abduction. Lymphedema risk reduction practices.    Person(s) Educated  Patient    Methods  Explanation;Demonstration    Comprehension  Returned demonstration;Verbalized understanding          PT Long Term Goals - 09/16/17 1424      PT LONG TERM GOAL #1   Title  Patient will demonstrate she has regained right shoulder function and ROM compared to baseline when seen post operatively.    Time  8    Period  Weeks    Status  Partially Met            Plan - 09/16/17 1423    Clinical Impression Statement  Patient is doing very well s.p right lumpectomy and sentinel node biopsy. Nodes were negative and she is scheduled for radiation. She has minimally limited  shoulder ROM but was instructed today in how to regain those last few degrees. She was also educated on lymphedema risk reduction practices and reported good understanding. No further Pt needed at this time.    Rehab Potential  Excellent    PT Treatment/Interventions  ADLs/Self Care Home Management;Therapeutic exercise;Patient/family education    PT Next Visit Plan  D/C    PT Home Exercise Plan  Post op shoulder ROM HEP    Consulted and Agree with Plan of Care  Patient       Patient will benefit from skilled therapeutic intervention in order to improve the following deficits and impairments:  Decreased knowledge of precautions, Impaired UE functional use, Decreased range of motion, Postural dysfunction, Pain  Visit Diagnosis: Malignant neoplasm of  upper-inner quadrant of right breast in female, estrogen receptor positive ([place])  Abnormal posture  Aftercare following surgery for neoplasm     Problem List Patient Active Problem List   Diagnosis Date Noted  . Malignant neoplasm of upper-outer quadrant of right breast in female, estrogen receptor positive ([place]) 07/12/2017  . Back pain 12/14/2016  . Overweight (BMI 25.0-29.9) 06/14/2015  . Gastric ulceration   . Hiatal hernia   . History of lower GI bleeding   . UGI bleed 01/27/2015  . Acute blood loss anemia 01/27/2015  . Vitamin D deficiency 06/15/2014  . GERD (gastroesophageal reflux disease) 06/15/2014  . Osteopenia 09/02/2013  . Hypothyroidism 06/05/2012  . HLD (hyperlipidemia) 06/05/2012  . Asthma 06/05/2012    PHYSICAL THERAPY DISCHARGE SUMMARY  Visits from Start of Care: 2  Current functional level related to goals / functional outcomes: Doing well but did not achieve baseline ROM post operatively, although is only lacking about 10 degrees.   Remaining deficits: Minimally lacking shoulder ROM.   Education / Equipment: HEP and lymphedema risk reduction practices. Plan: Patient agrees to discharge.  Patient goals  were partially met. Patient is being discharged due to being pleased with the current functional level.  ?????        Annia Friendly, [place] 09/16/17 2:26 PM   [place], [place], [address] Phone: [phone]   Fax:  [phone]  Name: Cheryl Perry MRN: [id number] Date of Birth: June 20, 1945

## 2017-09-19 ENCOUNTER — Ambulatory Visit
Admission: RE | Admit: 2017-09-19 | Discharge: 2017-09-19 | Disposition: A | Discharge status: Home / Self Care | Payer: Medicare Other | Source: Ambulatory Visit | Attending: Radiation Oncology | Admitting: Radiation Oncology

## 2017-09-19 ENCOUNTER — Other Ambulatory Visit: Payer: Self-pay

## 2017-09-19 ENCOUNTER — Encounter: Payer: Self-pay | Admitting: Radiation Oncology

## 2017-09-19 VITALS — BP 125/59 | HR 50 | Temp 98.1°F | Resp 20 | Ht 66.0 in | Wt 175.4 lb

## 2017-09-19 DIAGNOSIS — Z170 Estrogen receptor positive status [ER+]: Principal | ICD-10-CM

## 2017-09-19 DIAGNOSIS — Z882 Allergy status to sulfonamides status: Secondary | ICD-10-CM | POA: Insufficient documentation

## 2017-09-19 DIAGNOSIS — Z880 Allergy status to penicillin: Secondary | ICD-10-CM | POA: Diagnosis not present

## 2017-09-19 DIAGNOSIS — Z886 Allergy status to analgesic agent status: Secondary | ICD-10-CM | POA: Diagnosis not present

## 2017-09-19 DIAGNOSIS — Z79890 Hormone replacement therapy (postmenopausal): Secondary | ICD-10-CM | POA: Insufficient documentation

## 2017-09-19 DIAGNOSIS — Z881 Allergy status to other antibiotic agents status: Secondary | ICD-10-CM | POA: Diagnosis not present

## 2017-09-19 DIAGNOSIS — C50411 Malignant neoplasm of upper-outer quadrant of right female breast: Secondary | ICD-10-CM

## 2017-09-19 DIAGNOSIS — Z79899 Other long term (current) drug therapy: Secondary | ICD-10-CM | POA: Insufficient documentation

## 2017-09-19 DIAGNOSIS — Z87891 Personal history of nicotine dependence: Secondary | ICD-10-CM | POA: Insufficient documentation

## 2017-09-19 DIAGNOSIS — Z510 Encounter for antineoplastic radiation therapy: Secondary | ICD-10-CM | POA: Diagnosis not present

## 2017-09-19 DIAGNOSIS — E039 Hypothyroidism, unspecified: Secondary | ICD-10-CM | POA: Diagnosis not present

## 2017-09-19 DIAGNOSIS — Z9889 Other specified postprocedural states: Secondary | ICD-10-CM | POA: Diagnosis not present

## 2017-09-19 DIAGNOSIS — C773 Secondary and unspecified malignant neoplasm of axilla and upper limb lymph nodes: Secondary | ICD-10-CM | POA: Diagnosis not present

## 2017-09-19 NOTE — Progress Notes (Signed)
Radiation Oncology         [phone] ________________________________  Name: Cheryl Perry        MRN: [id number]  Date of Service: 09/19/2017 DOB: 1945/08/26  [place]:OVZCH, Cheryl Hawthorne, FNP  Cheryl Lose, MD     REFERRING PHYSICIAN: Nicholas Lose, MD   DIAGNOSIS: The encounter diagnosis was Malignant neoplasm of upper-outer quadrant of right breast in female, estrogen receptor positive ([place]).   HISTORY OF PRESENT ILLNESS: Cheryl Perry is a 72 y.o. female who was originally seen in the multidisciplinary breast clinic for a new diagnosis of right breast cancer. The patient was noted to have a screening detected mass in the right breast. She underwent diagnostic imaging that revealed a 1.2 cm lobulated mass, and her axilla was negative. She had a biopsy on  07/08/17 tha trevealed a grade 1-2 invasive ductal carcinoma, ER/PR positive, HEr2 negative with a Ki 67 of 2%.  She was taken to the operating room on 08/22/2017 where she underwent lumpectomy with sentinel lymph node biopsy.  Pathology revealed a 1.3 cm, grade 1 invasive ductal carcinoma, of the 2 lymph nodes sampled, none contained disease, and her margins were negative.  She did have Oncotype testing which showed her recurrence risk score of 0.  She will not need any chemotherapy, and is ready to come today to talk about the role of adjuvant radiation to prevent local recurrence.   PREVIOUS RADIATION THERAPY: No   PAST MEDICAL HISTORY:  Past Medical History:  Diagnosis Date  . Allergy   . Arthritis    back painherniated disc lumbar  . Asthma   . Back pain   . Cancer ([facility]) 07/2017   right breast cancer  . Complication of anesthesia   . GERD (gastroesophageal reflux disease)   . Hiatal hernia   . Hyperlipidemia   . Hypothyroidism   . Osteopenia   . PONV (postoperative nausea and vomiting)    asks for scop patch  . Thyroid disease   . Vasculopathy    lymphatic       PAST SURGICAL HISTORY: Past Surgical History:    Procedure Laterality Date  . BREAST LUMPECTOMY WITH RADIOACTIVE SEED AND SENTINEL LYMPH NODE BIOPSY Right 08/22/2017   Procedure: BREAST LUMPECTOMY WITH RADIOACTIVE SEED AND SENTINEL LYMPH NODE BIOPSY;  Surgeon: Cheryl Luna, MD;  Location: [facility];  Service: General;  Laterality: Right;  . COLONOSCOPY    . CYSTOSCOPY    . cystscopy    . ESOPHAGOGASTRODUODENOSCOPY N/A 01/28/2015   Dr.Rourk- 3 small prepyloric/gastric ulcers- likely culprits causing bleeding. hiatal hernia, small gastic polyps not manipulated.  . ESOPHAGOGASTRODUODENOSCOPY N/A 05/26/2015   Dr. Gala Perry: Small hiatal hernia, previous gastric ulcers completely healed.  Marland Kitchen KNEE SURGERY    . TONSILLECTOMY    . TUBAL LIGATION       FAMILY HISTORY:  Family History  Problem Relation Age of Onset  . Stroke Mother   . Hypertension Mother   . Alzheimer's disease Mother   . Heart disease Sister   . Hypertension Sister   . Heart attack Sister        Psychologist, forensic   . Arthritis Daughter        very bad MVA   . Hypertension Son   . Seizures Son      SOCIAL HISTORY:  reports that she quit smoking about 43 years ago. She has never used smokeless tobacco. She reports that she does not drink alcohol or use drugs. The patient  is married and lives in [place], [place]. She is retired from working in [employer] for the [employer]: Naproxen; Ampicillin; Avelox [moxifloxacin hcl in nacl]; Bactrim [sulfamethoxazole-trimethoprim]; Levaquin [levofloxacin in d5w]; Lipitor [atorvastatin]; Macrodantin [nitrofurantoin macrocrystal]; Premarin [conjugated estrogens]; Trental [pentoxifylline er]; and Livalo [pitavastatin]   MEDICATIONS:  Current Outpatient Medications  Medication Sig Dispense Refill  . albuterol (PROVENTIL HFA) 108 (90 BASE) MCG/ACT inhaler Inhale 2 puffs into the lungs every 6 (six) hours as needed for wheezing. May use only 3 times a month    . B Complex Vitamins (VITAMIN B COMPLEX PO) Take 1  tablet by mouth daily.    Marland Kitchen CALCIUM-MAGNESIUM-ZINC PO Take 1 tablet by mouth daily.    . Cholecalciferol (VITAMIN D) 2000 UNITS CAPS Take 1 capsule by mouth daily.    Marland Kitchen EPINEPHrine 0.3 mg/0.3 mL IJ SOAJ injection See admin instructions.  0  . fluticasone-salmeterol (ADVAIR HFA) 45-21 MCG/ACT inhaler Inhale 2 puffs into the lungs 2 (two) times daily. 3 Inhaler 3  . HYDROcodone-acetaminophen (NORCO/VICODIN) 5-325 MG tablet Take 1 tablet by mouth every 6 (six) hours as needed for moderate pain. 10 tablet 0  . levothyroxine (SYNTHROID, LEVOTHROID) 75 MCG tablet TAKE 1 TABLET (75 MCG TOTAL)  BY MOUTH DAILY BEFORE BREAKFAST. 90 tablet 3  . loratadine (CLARITIN) 10 MG tablet Take 10 mg by mouth daily.    . methocarbamol (ROBAXIN) 500 MG tablet Take 1 tablet (500 mg total) by mouth 3 (three) times daily. (Patient taking differently: Take 500 mg by mouth every 8 (eight) hours as needed for muscle spasms. ) 90 tablet 1  . montelukast (SINGULAIR) 10 MG tablet Take 10 mg by mouth at bedtime.    . pantoprazole (PROTONIX) 40 MG tablet Take 1 tablet (40 mg total) by mouth daily before breakfast. 90 tablet 3  . Probiotic Product (PROBIOTIC ADVANCED PO) Take 1 capsule by mouth daily.     . rosuvastatin (CRESTOR) 5 MG tablet TAKE 1 TABLET EVERY WEEK TO BEGIN 30 tablet 0   No current facility-administered medications for this encounter.      REVIEW OF SYSTEMS: On review of systems, the patient reports that she is doing well overall. She denies any chest pain, shortness of breath, cough, fevers, chills, night sweats, unintended weight changes. She denies any bowel disturbances, and she continues to have occasional stress incontinence. She denies abdominal pain, nausea or vomiting. She denies any new musculoskeletal or joint aches or pains. A complete review of systems is obtained and is otherwise negative.     PHYSICAL EXAM:  Wt Readings from Last 3 Encounters:  08/29/17 173 lb 8 oz (78.7 kg)  08/22/17 171 lb 8  oz (77.8 kg)  07/17/17 172 lb 14.4 oz (78.4 kg)   Temp Readings from Last 3 Encounters:  08/29/17 98.1 F (36.7 C) (Oral)  08/22/17 97.7 F (36.5 C)  07/17/17 98 F (36.7 C) (Oral)   BP Readings from Last 3 Encounters:  08/29/17 140/82  08/22/17 (!) 151/83  07/17/17 140/83   Pulse Readings from Last 3 Encounters:  08/29/17 66  08/22/17 (!) 56  07/17/17 (!) 53     In general this is a well appearing caucasian female in no acute distress. She is alert and oriented x4 and appropriate throughout the examination. HEENT reveals that the patient is normocephalic, atraumatic. EOMs are intact. Cardiopulmonary assessment is negative for acute distress and she exhibits normal effort. Her right breast reveals a well healed lumpectomy and axillary incision. No  erythema is noted and mild induration is noted.   ECOG = 0  0 - Asymptomatic (Fully active, able to carry on all predisease activities without restriction)  1 - Symptomatic but completely ambulatory (Restricted in physically strenuous activity but ambulatory and able to carry out work of a light or sedentary nature. For example, light housework, office work)  2 - Symptomatic, <50% in bed during the day (Ambulatory and capable of all self care but unable to carry out any work activities. Up and about more than 50% of waking hours)  3 - Symptomatic, >50% in bed, but not bedbound (Capable of only limited self-care, confined to bed or chair 50% or more of waking hours)  4 - Bedbound (Completely disabled. Cannot carry on any self-care. Totally confined to bed or chair)  5 - Death   Eustace Pen MM, Creech RH, Tormey DC, et al. [phone]). "Toxicity and response criteria of the [facility]". [place] Oncol. 5 (6): 649-55    LABORATORY DATA:  Lab Results  Component Value Date   WBC 5.8 07/17/2017   HGB 14.6 07/17/2017   HCT 44.7 07/17/2017   MCV 99.3 07/17/2017   PLT 205 07/17/2017   Lab Results  Component Value  Date   NA 141 07/17/2017   K 4.1 07/17/2017   CL 106 07/17/2017   CO2 27 07/17/2017   Lab Results  Component Value Date   ALT 21 07/17/2017   AST 22 07/17/2017   ALKPHOS 78 07/17/2017   BILITOT 0.4 07/17/2017      RADIOGRAPHY: Nm Sentinel Node Inj-no Rpt (breast)  Result Date: 08/22/2017 Sulfur colloid was injected by the nuclear medicine technologist for melanoma sentinel node.       IMPRESSION/PLAN: 1. Stage IA, pT1cN0M0, grade 1 ER/PR positive invasive ductal carcinoma of the right breast. Dr. Lisbeth Renshaw discusses the final  pathology findings and reviews the nature of invasive breast disease. We reviewed the rationale for adjuvant radiotherapy and antiestrogen therapy. She is well healed and ready to proceed. We discussed the risks, benefits, short, and long term effects of radiotherapy, and the patient is interested in proceeding. Dr. Lisbeth Renshaw discusses the delivery and logistics of radiotherapy and anticipates a course of 4 weeks of treatment. Written consent is obtained and placed in the chart, a copy was provided to the patient. She will simulate this morning.   In a visit lasting 25 minutes, greater than 50% of the time was spent face to face discussing her case, and coordinating the patient's care.   The above documentation reflects my direct findings during this shared patient visit. Please see the separate note by Dr. Lisbeth Renshaw on this date for the remainder of the patient's plan of care.    Carola Rhine, PAC

## 2017-09-19 NOTE — Addendum Note (Signed)
Encounter addended by: Malena Edman, RN on: 09/19/2017 11:22 AM  Actions taken: Medication List reviewed, Problem List reviewed, Allergies reviewed

## 2017-09-20 NOTE — Progress Notes (Signed)
  Radiation Oncology         [phone] ________________________________  Name: Cheryl Perry MRN: [id number]  Date: 09/19/2017  DOB: [birth year]   DIAGNOSIS:     ICD-10-CM   1. Malignant neoplasm of upper-outer quadrant of right breast in female, estrogen receptor positive ([place]) C50.411    Z17.0     SIMULATION AND TREATMENT PLANNING NOTE  The patient presented for simulation prior to beginning her course of radiation treatment for her diagnosis of right-sided breast cancer. The patient was placed in a supine position on a breast board. A customized vac-lock bag was constructed and this complex treatment device will be used on a daily basis during her treatment. In this fashion, a CT scan was obtained through the chest area and an isocenter was placed near the chest wall within the breast.  The patient will be planned to receive a course of radiation initially to a dose of 42.56 Gy. This will consist of a whole breast radiotherapy technique. To accomplish this, 2 customized blocks have been designed which will correspond to medial and lateral whole breast tangent fields. This treatment will be accomplished at 2.66 Gy per fraction. A forward planning technique will also be evaluated to determine if this approach improves the plan. It is anticipated that the patient will then receive a 8 Gy boost to the seroma cavity which has been contoured. This will be accomplished at 2 Gy per fraction.   This initial treatment will consist of a 3-D conformal technique. The seroma has been contoured as the primary target structure. Additionally, dose volume histograms of both this target as well as the lungs and heart will also be evaluated. Such an approach is necessary to ensure that the target area is adequately covered while the nearby critical  normal structures are adequately spared.  Plan:  The final anticipated total dose therefore will correspond to 50.56  Gy.    _______________________________   Jodelle Gross, MD, PhD

## 2017-09-20 NOTE — Progress Notes (Signed)
  Radiation Oncology         [phone] ________________________________  Name: Cheryl Perry MRN: [id number]  Date: 09/19/2017  DOB: 07-19-1945  Optical Surface Tracking Plan:  Since intensity modulated radiotherapy (IMRT) and 3D conformal radiation treatment methods are predicated on accurate and precise positioning for treatment, intrafraction motion monitoring is medically necessary to ensure accurate and safe treatment delivery.  The ability to quantify intrafraction motion without excessive ionizing radiation dose can only be performed with optical surface tracking. Accordingly, surface imaging offers the opportunity to obtain 3D measurements of patient position throughout IMRT and 3D treatments without excessive radiation exposure.  I am ordering optical surface tracking for this patient's upcoming course of radiotherapy. ________________________________  Kyung Rudd, MD 09/20/2017 6:42 AM    Reference:   Particia Jasper, et al. Surface imaging-based analysis of intrafraction motion for breast radiotherapy patients.Journal of [place], n. 6, nov. 2014. ISSN [id number].   Available at: <http://www.jacmp.org/index.php/jacmp/article/view/4957>.

## 2017-09-23 ENCOUNTER — Ambulatory Visit: Payer: Medicare Other | Admitting: Physical Therapy

## 2017-09-23 DIAGNOSIS — J301 Allergic rhinitis due to pollen: Secondary | ICD-10-CM | POA: Diagnosis not present

## 2017-09-23 DIAGNOSIS — J3089 Other allergic rhinitis: Secondary | ICD-10-CM | POA: Diagnosis not present

## 2017-09-23 DIAGNOSIS — J3081 Allergic rhinitis due to animal (cat) (dog) hair and dander: Secondary | ICD-10-CM | POA: Diagnosis not present

## 2017-09-24 DIAGNOSIS — Z170 Estrogen receptor positive status [ER+]: Secondary | ICD-10-CM | POA: Diagnosis not present

## 2017-09-24 DIAGNOSIS — C50411 Malignant neoplasm of upper-outer quadrant of right female breast: Secondary | ICD-10-CM | POA: Diagnosis not present

## 2017-09-24 DIAGNOSIS — Z510 Encounter for antineoplastic radiation therapy: Secondary | ICD-10-CM | POA: Diagnosis not present

## 2017-09-25 ENCOUNTER — Ambulatory Visit
Admission: RE | Admit: 2017-09-25 | Discharge: 2017-09-25 | Disposition: A | Discharge status: Home / Self Care | Payer: Medicare Other | Source: Ambulatory Visit | Attending: Radiation Oncology | Admitting: Radiation Oncology

## 2017-09-25 ENCOUNTER — Telehealth: Payer: Self-pay | Admitting: Hematology and Oncology

## 2017-09-25 DIAGNOSIS — Z170 Estrogen receptor positive status [ER+]: Principal | ICD-10-CM

## 2017-09-25 DIAGNOSIS — C50411 Malignant neoplasm of upper-outer quadrant of right female breast: Secondary | ICD-10-CM

## 2017-09-25 DIAGNOSIS — Z510 Encounter for antineoplastic radiation therapy: Secondary | ICD-10-CM | POA: Diagnosis not present

## 2017-09-25 MED ORDER — RADIAPLEXRX EX GEL
Freq: Once | CUTANEOUS | Status: AC
  Administered 2017-09-25: 15:00:00 via TOPICAL

## 2017-09-25 MED ORDER — ALRA NON-METALLIC DEODORANT (RAD-ONC)
1.0000 "application " | Freq: Once | TOPICAL | Status: AC
  Administered 2017-09-25: 1 via TOPICAL

## 2017-09-25 NOTE — Progress Notes (Signed)
Pt here for patient teaching.  Pt given Radiation and You booklet, skin care instructions, Alra deodorant and Radiaplex gel.  Reviewed areas of pertinence such as fatigue, hair loss, skin changes, breast tenderness and breast swelling . Pt able to give teach back of to pat skin and use unscented/gentle soap,apply Radiaplex bid, avoid applying anything to skin within 4 hours of treatment and to use an electric razor if they must shave. Pt verbalizes understanding of information given and will contact nursing with any questions or concerns.     Gloriajean Dell. Leonie Green, BSN

## 2017-09-25 NOTE — Telephone Encounter (Signed)
Per 9/16 sch msg.  Called patient to sch f/u appt with VG followng xrt; 10/15 @ 8:45 am.

## 2017-09-26 ENCOUNTER — Ambulatory Visit
Admission: RE | Admit: 2017-09-26 | Discharge: 2017-09-26 | Disposition: A | Discharge status: Home / Self Care | Payer: Medicare Other | Source: Ambulatory Visit | Attending: Radiation Oncology | Admitting: Radiation Oncology

## 2017-09-26 DIAGNOSIS — Z170 Estrogen receptor positive status [ER+]: Secondary | ICD-10-CM | POA: Diagnosis not present

## 2017-09-26 DIAGNOSIS — C50411 Malignant neoplasm of upper-outer quadrant of right female breast: Secondary | ICD-10-CM | POA: Diagnosis not present

## 2017-09-26 DIAGNOSIS — Z510 Encounter for antineoplastic radiation therapy: Secondary | ICD-10-CM | POA: Diagnosis not present

## 2017-09-27 ENCOUNTER — Ambulatory Visit
Admission: RE | Admit: 2017-09-27 | Discharge: 2017-09-27 | Disposition: A | Discharge status: Home / Self Care | Payer: Medicare Other | Source: Ambulatory Visit | Attending: Radiation Oncology | Admitting: Radiation Oncology

## 2017-09-27 DIAGNOSIS — Z510 Encounter for antineoplastic radiation therapy: Secondary | ICD-10-CM | POA: Diagnosis not present

## 2017-09-27 DIAGNOSIS — C50411 Malignant neoplasm of upper-outer quadrant of right female breast: Secondary | ICD-10-CM | POA: Diagnosis not present

## 2017-09-27 DIAGNOSIS — Z170 Estrogen receptor positive status [ER+]: Secondary | ICD-10-CM | POA: Diagnosis not present

## 2017-09-30 ENCOUNTER — Ambulatory Visit
Admission: RE | Admit: 2017-09-30 | Discharge: 2017-09-30 | Disposition: A | Discharge status: Home / Self Care | Payer: Medicare Other | Source: Ambulatory Visit | Attending: Radiation Oncology | Admitting: Radiation Oncology

## 2017-09-30 DIAGNOSIS — Z510 Encounter for antineoplastic radiation therapy: Secondary | ICD-10-CM | POA: Diagnosis not present

## 2017-09-30 DIAGNOSIS — C50411 Malignant neoplasm of upper-outer quadrant of right female breast: Secondary | ICD-10-CM | POA: Diagnosis not present

## 2017-09-30 DIAGNOSIS — Z170 Estrogen receptor positive status [ER+]: Secondary | ICD-10-CM | POA: Diagnosis not present

## 2017-10-01 ENCOUNTER — Ambulatory Visit
Admission: RE | Admit: 2017-10-01 | Discharge: 2017-10-01 | Disposition: A | Discharge status: Home / Self Care | Payer: Medicare Other | Source: Ambulatory Visit | Attending: Radiation Oncology | Admitting: Radiation Oncology

## 2017-10-01 DIAGNOSIS — Z510 Encounter for antineoplastic radiation therapy: Secondary | ICD-10-CM | POA: Diagnosis not present

## 2017-10-01 DIAGNOSIS — C50411 Malignant neoplasm of upper-outer quadrant of right female breast: Secondary | ICD-10-CM | POA: Diagnosis not present

## 2017-10-01 DIAGNOSIS — Z170 Estrogen receptor positive status [ER+]: Secondary | ICD-10-CM | POA: Diagnosis not present

## 2017-10-02 ENCOUNTER — Ambulatory Visit
Admission: RE | Admit: 2017-10-02 | Discharge: 2017-10-02 | Disposition: A | Discharge status: Home / Self Care | Payer: Medicare Other | Source: Ambulatory Visit | Attending: Radiation Oncology | Admitting: Radiation Oncology

## 2017-10-02 DIAGNOSIS — Z170 Estrogen receptor positive status [ER+]: Secondary | ICD-10-CM | POA: Diagnosis not present

## 2017-10-02 DIAGNOSIS — C50411 Malignant neoplasm of upper-outer quadrant of right female breast: Secondary | ICD-10-CM | POA: Diagnosis not present

## 2017-10-02 DIAGNOSIS — Z510 Encounter for antineoplastic radiation therapy: Secondary | ICD-10-CM | POA: Diagnosis not present

## 2017-10-03 ENCOUNTER — Ambulatory Visit
Admission: RE | Admit: 2017-10-03 | Discharge: 2017-10-03 | Disposition: A | Discharge status: Home / Self Care | Payer: Medicare Other | Source: Ambulatory Visit | Attending: Radiation Oncology | Admitting: Radiation Oncology

## 2017-10-03 DIAGNOSIS — C50411 Malignant neoplasm of upper-outer quadrant of right female breast: Secondary | ICD-10-CM | POA: Diagnosis not present

## 2017-10-03 DIAGNOSIS — Z510 Encounter for antineoplastic radiation therapy: Secondary | ICD-10-CM | POA: Diagnosis not present

## 2017-10-03 DIAGNOSIS — Z170 Estrogen receptor positive status [ER+]: Secondary | ICD-10-CM | POA: Diagnosis not present

## 2017-10-04 ENCOUNTER — Ambulatory Visit
Admission: RE | Admit: 2017-10-04 | Discharge: 2017-10-04 | Disposition: A | Discharge status: Home / Self Care | Payer: Medicare Other | Source: Ambulatory Visit | Attending: Radiation Oncology | Admitting: Radiation Oncology

## 2017-10-04 DIAGNOSIS — Z510 Encounter for antineoplastic radiation therapy: Secondary | ICD-10-CM | POA: Diagnosis not present

## 2017-10-04 DIAGNOSIS — Z170 Estrogen receptor positive status [ER+]: Secondary | ICD-10-CM | POA: Diagnosis not present

## 2017-10-04 DIAGNOSIS — J3089 Other allergic rhinitis: Secondary | ICD-10-CM | POA: Diagnosis not present

## 2017-10-04 DIAGNOSIS — J301 Allergic rhinitis due to pollen: Secondary | ICD-10-CM | POA: Diagnosis not present

## 2017-10-04 DIAGNOSIS — J3081 Allergic rhinitis due to animal (cat) (dog) hair and dander: Secondary | ICD-10-CM | POA: Diagnosis not present

## 2017-10-04 DIAGNOSIS — C50411 Malignant neoplasm of upper-outer quadrant of right female breast: Secondary | ICD-10-CM | POA: Diagnosis not present

## 2017-10-07 ENCOUNTER — Ambulatory Visit
Admission: RE | Admit: 2017-10-07 | Discharge: 2017-10-07 | Disposition: A | Discharge status: Home / Self Care | Payer: Medicare Other | Source: Ambulatory Visit | Attending: Radiation Oncology | Admitting: Radiation Oncology

## 2017-10-07 DIAGNOSIS — Z170 Estrogen receptor positive status [ER+]: Secondary | ICD-10-CM | POA: Diagnosis not present

## 2017-10-07 DIAGNOSIS — Z510 Encounter for antineoplastic radiation therapy: Secondary | ICD-10-CM | POA: Diagnosis not present

## 2017-10-07 DIAGNOSIS — C50411 Malignant neoplasm of upper-outer quadrant of right female breast: Secondary | ICD-10-CM | POA: Diagnosis not present

## 2017-10-08 ENCOUNTER — Ambulatory Visit
Admission: RE | Admit: 2017-10-08 | Discharge: 2017-10-08 | Disposition: A | Discharge status: Home / Self Care | Payer: Medicare Other | Source: Ambulatory Visit | Attending: Radiation Oncology | Admitting: Radiation Oncology

## 2017-10-08 DIAGNOSIS — Z170 Estrogen receptor positive status [ER+]: Secondary | ICD-10-CM | POA: Diagnosis not present

## 2017-10-08 DIAGNOSIS — Z510 Encounter for antineoplastic radiation therapy: Secondary | ICD-10-CM | POA: Insufficient documentation

## 2017-10-08 DIAGNOSIS — C50411 Malignant neoplasm of upper-outer quadrant of right female breast: Secondary | ICD-10-CM | POA: Insufficient documentation

## 2017-10-09 ENCOUNTER — Ambulatory Visit
Admission: RE | Admit: 2017-10-09 | Discharge: 2017-10-09 | Disposition: A | Discharge status: Home / Self Care | Payer: Medicare Other | Source: Ambulatory Visit | Attending: Radiation Oncology | Admitting: Radiation Oncology

## 2017-10-09 DIAGNOSIS — Z170 Estrogen receptor positive status [ER+]: Secondary | ICD-10-CM | POA: Diagnosis not present

## 2017-10-09 DIAGNOSIS — Z510 Encounter for antineoplastic radiation therapy: Secondary | ICD-10-CM | POA: Diagnosis not present

## 2017-10-09 DIAGNOSIS — C50411 Malignant neoplasm of upper-outer quadrant of right female breast: Secondary | ICD-10-CM | POA: Diagnosis not present

## 2017-10-10 ENCOUNTER — Ambulatory Visit
Admission: RE | Admit: 2017-10-10 | Discharge: 2017-10-10 | Disposition: A | Discharge status: Home / Self Care | Payer: Medicare Other | Source: Ambulatory Visit | Attending: Radiation Oncology | Admitting: Radiation Oncology

## 2017-10-10 DIAGNOSIS — Z170 Estrogen receptor positive status [ER+]: Secondary | ICD-10-CM | POA: Diagnosis not present

## 2017-10-10 DIAGNOSIS — C50411 Malignant neoplasm of upper-outer quadrant of right female breast: Secondary | ICD-10-CM | POA: Diagnosis not present

## 2017-10-10 DIAGNOSIS — Z510 Encounter for antineoplastic radiation therapy: Secondary | ICD-10-CM | POA: Diagnosis not present

## 2017-10-11 ENCOUNTER — Ambulatory Visit
Admission: RE | Admit: 2017-10-11 | Discharge: 2017-10-11 | Disposition: A | Discharge status: Home / Self Care | Payer: Medicare Other | Source: Ambulatory Visit | Attending: Radiation Oncology | Admitting: Radiation Oncology

## 2017-10-11 DIAGNOSIS — J301 Allergic rhinitis due to pollen: Secondary | ICD-10-CM | POA: Diagnosis not present

## 2017-10-11 DIAGNOSIS — C50411 Malignant neoplasm of upper-outer quadrant of right female breast: Secondary | ICD-10-CM | POA: Diagnosis not present

## 2017-10-11 DIAGNOSIS — J3081 Allergic rhinitis due to animal (cat) (dog) hair and dander: Secondary | ICD-10-CM | POA: Diagnosis not present

## 2017-10-11 DIAGNOSIS — J3089 Other allergic rhinitis: Secondary | ICD-10-CM | POA: Diagnosis not present

## 2017-10-11 DIAGNOSIS — Z510 Encounter for antineoplastic radiation therapy: Secondary | ICD-10-CM | POA: Diagnosis not present

## 2017-10-11 DIAGNOSIS — Z170 Estrogen receptor positive status [ER+]: Secondary | ICD-10-CM | POA: Diagnosis not present

## 2017-10-14 ENCOUNTER — Ambulatory Visit
Admission: RE | Admit: 2017-10-14 | Discharge: 2017-10-14 | Disposition: A | Discharge status: Home / Self Care | Payer: Medicare Other | Source: Ambulatory Visit | Attending: Radiation Oncology | Admitting: Radiation Oncology

## 2017-10-14 ENCOUNTER — Ambulatory Visit (INDEPENDENT_AMBULATORY_CARE_PROVIDER_SITE_OTHER): Payer: Medicare Other

## 2017-10-14 DIAGNOSIS — Z170 Estrogen receptor positive status [ER+]: Secondary | ICD-10-CM | POA: Diagnosis not present

## 2017-10-14 DIAGNOSIS — Z23 Encounter for immunization: Secondary | ICD-10-CM

## 2017-10-14 DIAGNOSIS — C50411 Malignant neoplasm of upper-outer quadrant of right female breast: Secondary | ICD-10-CM | POA: Diagnosis not present

## 2017-10-14 DIAGNOSIS — Z510 Encounter for antineoplastic radiation therapy: Secondary | ICD-10-CM | POA: Diagnosis not present

## 2017-10-15 ENCOUNTER — Ambulatory Visit
Admission: RE | Admit: 2017-10-15 | Discharge: 2017-10-15 | Disposition: A | Discharge status: Home / Self Care | Payer: Medicare Other | Source: Ambulatory Visit | Attending: Radiation Oncology | Admitting: Radiation Oncology

## 2017-10-15 DIAGNOSIS — Z170 Estrogen receptor positive status [ER+]: Secondary | ICD-10-CM | POA: Diagnosis not present

## 2017-10-15 DIAGNOSIS — Z510 Encounter for antineoplastic radiation therapy: Secondary | ICD-10-CM | POA: Diagnosis not present

## 2017-10-15 DIAGNOSIS — C50411 Malignant neoplasm of upper-outer quadrant of right female breast: Secondary | ICD-10-CM | POA: Diagnosis not present

## 2017-10-15 DIAGNOSIS — M5136 Other intervertebral disc degeneration, lumbar region: Secondary | ICD-10-CM | POA: Diagnosis not present

## 2017-10-16 ENCOUNTER — Ambulatory Visit
Admission: RE | Admit: 2017-10-16 | Discharge: 2017-10-16 | Disposition: A | Discharge status: Home / Self Care | Payer: Medicare Other | Source: Ambulatory Visit | Attending: Radiation Oncology | Admitting: Radiation Oncology

## 2017-10-16 DIAGNOSIS — Z510 Encounter for antineoplastic radiation therapy: Secondary | ICD-10-CM | POA: Diagnosis not present

## 2017-10-16 DIAGNOSIS — Z170 Estrogen receptor positive status [ER+]: Secondary | ICD-10-CM | POA: Diagnosis not present

## 2017-10-16 DIAGNOSIS — C50411 Malignant neoplasm of upper-outer quadrant of right female breast: Secondary | ICD-10-CM | POA: Diagnosis not present

## 2017-10-17 ENCOUNTER — Ambulatory Visit
Admission: RE | Admit: 2017-10-17 | Discharge: 2017-10-17 | Disposition: A | Discharge status: Home / Self Care | Payer: Medicare Other | Source: Ambulatory Visit | Attending: Radiation Oncology | Admitting: Radiation Oncology

## 2017-10-17 DIAGNOSIS — Z510 Encounter for antineoplastic radiation therapy: Secondary | ICD-10-CM | POA: Diagnosis not present

## 2017-10-17 DIAGNOSIS — C50411 Malignant neoplasm of upper-outer quadrant of right female breast: Secondary | ICD-10-CM | POA: Diagnosis not present

## 2017-10-17 DIAGNOSIS — Z170 Estrogen receptor positive status [ER+]: Secondary | ICD-10-CM | POA: Diagnosis not present

## 2017-10-18 ENCOUNTER — Ambulatory Visit
Admission: RE | Admit: 2017-10-18 | Discharge: 2017-10-18 | Disposition: A | Discharge status: Home / Self Care | Payer: Medicare Other | Source: Ambulatory Visit | Attending: Radiation Oncology | Admitting: Radiation Oncology

## 2017-10-18 DIAGNOSIS — C50411 Malignant neoplasm of upper-outer quadrant of right female breast: Secondary | ICD-10-CM | POA: Diagnosis not present

## 2017-10-18 DIAGNOSIS — Z170 Estrogen receptor positive status [ER+]: Secondary | ICD-10-CM | POA: Diagnosis not present

## 2017-10-18 DIAGNOSIS — Z510 Encounter for antineoplastic radiation therapy: Secondary | ICD-10-CM | POA: Diagnosis not present

## 2017-10-21 ENCOUNTER — Ambulatory Visit
Admission: RE | Admit: 2017-10-21 | Discharge: 2017-10-21 | Disposition: A | Discharge status: Home / Self Care | Payer: Medicare Other | Source: Ambulatory Visit | Attending: Radiation Oncology | Admitting: Radiation Oncology

## 2017-10-21 DIAGNOSIS — Z170 Estrogen receptor positive status [ER+]: Secondary | ICD-10-CM | POA: Diagnosis not present

## 2017-10-21 DIAGNOSIS — J301 Allergic rhinitis due to pollen: Secondary | ICD-10-CM | POA: Diagnosis not present

## 2017-10-21 DIAGNOSIS — J3089 Other allergic rhinitis: Secondary | ICD-10-CM | POA: Diagnosis not present

## 2017-10-21 DIAGNOSIS — Z510 Encounter for antineoplastic radiation therapy: Secondary | ICD-10-CM | POA: Diagnosis not present

## 2017-10-21 DIAGNOSIS — C50411 Malignant neoplasm of upper-outer quadrant of right female breast: Secondary | ICD-10-CM | POA: Diagnosis not present

## 2017-10-21 DIAGNOSIS — N814 Uterovaginal prolapse, unspecified: Secondary | ICD-10-CM | POA: Diagnosis not present

## 2017-10-21 DIAGNOSIS — J3081 Allergic rhinitis due to animal (cat) (dog) hair and dander: Secondary | ICD-10-CM | POA: Diagnosis not present

## 2017-10-22 ENCOUNTER — Ambulatory Visit
Admission: RE | Admit: 2017-10-22 | Discharge: 2017-10-22 | Disposition: A | Discharge status: Home / Self Care | Payer: Medicare Other | Source: Ambulatory Visit | Attending: Radiation Oncology | Admitting: Radiation Oncology

## 2017-10-22 ENCOUNTER — Telehealth: Payer: Self-pay | Admitting: Adult Health

## 2017-10-22 ENCOUNTER — Encounter: Payer: Self-pay | Admitting: Radiation Oncology

## 2017-10-22 ENCOUNTER — Inpatient Hospital Stay: Payer: Medicare Other | Attending: Hematology and Oncology | Admitting: Hematology and Oncology

## 2017-10-22 DIAGNOSIS — Z79811 Long term (current) use of aromatase inhibitors: Secondary | ICD-10-CM | POA: Diagnosis not present

## 2017-10-22 DIAGNOSIS — Z923 Personal history of irradiation: Secondary | ICD-10-CM | POA: Insufficient documentation

## 2017-10-22 DIAGNOSIS — Z79899 Other long term (current) drug therapy: Secondary | ICD-10-CM | POA: Diagnosis not present

## 2017-10-22 DIAGNOSIS — Z170 Estrogen receptor positive status [ER+]: Secondary | ICD-10-CM | POA: Diagnosis not present

## 2017-10-22 DIAGNOSIS — Z9221 Personal history of antineoplastic chemotherapy: Secondary | ICD-10-CM | POA: Diagnosis not present

## 2017-10-22 DIAGNOSIS — C50411 Malignant neoplasm of upper-outer quadrant of right female breast: Secondary | ICD-10-CM | POA: Insufficient documentation

## 2017-10-22 DIAGNOSIS — Z510 Encounter for antineoplastic radiation therapy: Secondary | ICD-10-CM | POA: Diagnosis not present

## 2017-10-22 MED ORDER — LETROZOLE 2.5 MG PO TABS: 2.5000 mg | ORAL_TABLET | Freq: Every day | ORAL | 3 refills | Status: DC

## 2017-10-22 NOTE — Telephone Encounter (Signed)
Gave avs and calendar ° °

## 2017-10-22 NOTE — Assessment & Plan Note (Signed)
08/22/2017:Right lumpectomy: IDC grade 1, 1.3 cm, margins negative, negative for lymphovascular or perineural invasion, 0/2 lymph nodes negative, ER 100%, PR 100%, Ki-67 2%, HER-2 negative ratio 1.29 T1CN0 stage Ia Oncotype DX recurrence score 0: Distant recurrence of 9 years: 3% Adjuvant radiation therapy 09/26/2017 to 10/22/2017  Treatment plan: Adjuvant antiestrogen therapy with letrozole 2.5 mg daily  Letrozole counseling: We discussed the risks and benefits of anti-estrogen therapy with aromatase inhibitors. These include but not limited to insomnia, hot flashes, mood changes, vaginal dryness, bone density loss, and weight gain. We strongly believe that the benefits far outweigh the risks. Patient understands these risks and consented to starting treatment. Planned treatment duration is 5 years.  Return to clinic in 3 months for survivorship care plan visit

## 2017-10-22 NOTE — Progress Notes (Signed)
Patient Care Team: Sharion Balloon, FNP as PCP - General (Family Medicine) Clent Jacks, MD as Consulting Physician (Ophthalmology) Gala Romney, Cristopher Estimable, MD as Consulting Physician (Gastroenterology) Suella Broad, MD as Consulting Physician (Physical Medicine and Rehabilitation) Iran Planas, MD as Consulting Physician (Orthopedic Surgery) Richmond Campbell, MD as Consulting Physician (Gastroenterology) Nicholas Lose, MD as Consulting Physician (Hematology and Oncology) Erroll Luna, MD as Consulting Physician (General Surgery) Kyung Rudd, MD as Consulting Physician (Radiation Oncology)  DIAGNOSIS:  Encounter Diagnosis  Name Primary?  . Malignant neoplasm of upper-outer quadrant of right breast in female, estrogen receptor positive ([name])     SUMMARY OF ONCOLOGIC HISTORY:   Malignant neoplasm of upper-outer quadrant of right breast in female, estrogen receptor positive ([name])   07/08/2017 Initial Diagnosis    Screening detected right breast mass 1.2 cm at 10 o'clock position 6 cm from the nipple axilla negative: Biopsy revealed invasive ductal carcinoma grade 1-2 with a low-grade DCIS, ER 100%, PR 100%, Ki-67 2%, HER-2 negative ratio 1.29, T1CN0 stage Ia AJCC 8    08/22/2017 Surgery    Right lumpectomy: IDC grade 1, 1.3 cm, margins negative, negative for lymphovascular or perineural invasion, 0/2 lymph nodes negative, ER 100%, PR 100%, Ki-67 2%, HER-2 negative ratio 1.29 T1CN0 stage Ia    09/06/2017 Oncotype testing    Oncotype DX recurrence score 0: Distant recurrence risk at 9 years is 3%    09/11/2017 Cancer Staging    Staging form: Breast, AJCC 8th Edition - Pathologic: Stage IA (pT1c, pN0, cM0, G1, ER+, PR+, HER2-, Oncotype DX score: 0) - Signed by Gardenia Phlegm, NP on 09/11/2017    09/26/2017 - 10/22/2017 Radiation Therapy    Adjuvant radiation therapy     CHIEF COMPLIANT: Follow-up after completion of radiation therapy  INTERVAL HISTORY: Cheryl Perry is a 9  with above-mentioned history of right breast cancer treated with lumpectomy and she finished radiation today.  She is here to discuss starting antiestrogen therapy.  She has done extremely well from radiation standpoint.  She has 2 grandchildren and she is excited about spending time with them.  REVIEW OF SYSTEMS:   Constitutional: Denies fevers, chills or abnormal weight loss Eyes: Denies blurriness of vision Ears, nose, mouth, throat, and face: Denies mucositis or sore throat Respiratory: Denies cough, dyspnea or wheezes Cardiovascular: Denies palpitation, chest discomfort Gastrointestinal:  Denies nausea, heartburn or change in bowel habits Skin: Denies abnormal skin rashes Lymphatics: Denies new lymphadenopathy or easy bruising Neurological:Denies numbness, tingling or new weaknesses Behavioral/Psych: Mood is stable, no new changes  Extremities: No lower extremity edema Breast: Radiation dermatitis All other systems were reviewed with the patient and are negative.  I have reviewed the past medical history, past surgical history, social history and family history with the patient and they are unchanged from previous note.  ALLERGIES:  is allergic to naproxen; ampicillin; avelox [moxifloxacin hcl in nacl]; bactrim [sulfamethoxazole-trimethoprim]; levaquin [levofloxacin in d5w]; lipitor [atorvastatin]; macrodantin [nitrofurantoin macrocrystal]; premarin [conjugated estrogens]; trental [pentoxifylline er]; and livalo [pitavastatin].  MEDICATIONS:  Current Outpatient Medications  Medication Sig Dispense Refill  . albuterol (PROVENTIL HFA) 108 (90 BASE) MCG/ACT inhaler Inhale 2 puffs into the lungs every 6 (six) hours as needed for wheezing. May use only 3 times a month    . B Complex Vitamins (VITAMIN B COMPLEX PO) Take 1 tablet by mouth daily.    Marland Kitchen CALCIUM-MAGNESIUM-ZINC PO Take 1 tablet by mouth daily.    . Cholecalciferol (VITAMIN D) 2000 UNITS CAPS Take  1 capsule by mouth daily.    Marland Kitchen  EPINEPHrine 0.3 mg/0.3 mL IJ SOAJ injection See admin instructions.  0  . fluticasone-salmeterol (ADVAIR HFA) 45-21 MCG/ACT inhaler Inhale 2 puffs into the lungs 2 (two) times daily. 3 Inhaler 3  . HYDROcodone-acetaminophen (NORCO/VICODIN) 5-325 MG tablet Take 1 tablet by mouth every 6 (six) hours as needed for moderate pain. (Patient not taking: Reported on 09/19/2017) 10 tablet 0  . levothyroxine (SYNTHROID, LEVOTHROID) 75 MCG tablet TAKE 1 TABLET (75 MCG TOTAL)  BY MOUTH DAILY BEFORE BREAKFAST. 90 tablet 3  . loratadine (CLARITIN) 10 MG tablet Take 10 mg by mouth daily.    . methocarbamol (ROBAXIN) 500 MG tablet Take 1 tablet (500 mg total) by mouth 3 (three) times daily. (Patient taking differently: Take 500 mg by mouth every 8 (eight) hours as needed for muscle spasms. ) 90 tablet 1  . montelukast (SINGULAIR) 10 MG tablet Take 10 mg by mouth at bedtime.    . pantoprazole (PROTONIX) 40 MG tablet Take 1 tablet (40 mg total) by mouth daily before breakfast. 90 tablet 3  . Probiotic Product (PROBIOTIC ADVANCED PO) Take 1 capsule by mouth daily.     . rosuvastatin (CRESTOR) 5 MG tablet TAKE 1 TABLET EVERY WEEK TO BEGIN 30 tablet 0   No current facility-administered medications for this visit.     PHYSICAL EXAMINATION: ECOG PERFORMANCE STATUS: 1 - Symptomatic but completely ambulatory  Vitals:   10/22/17 0838  BP: 127/80  Pulse: 68  Resp: 19  Temp: 98.1 F (36.7 C)  SpO2: 98%   Filed Weights   10/22/17 0838  Weight: 171 lb 12.8 oz (77.9 kg)    GENERAL:alert, no distress and comfortable SKIN: skin color, texture, turgor are normal, no rashes or significant lesions EYES: normal, Conjunctiva are pink and non-injected, sclera clear OROPHARYNX:no exudate, no erythema and lips, buccal mucosa, and tongue normal  NECK: supple, thyroid normal size, non-tender, without nodularity LYMPH:  no palpable lymphadenopathy in the cervical, axillary or inguinal LUNGS: clear to auscultation and  percussion with normal breathing effort HEART: regular rate & rhythm and no murmurs and no lower extremity edema ABDOMEN:abdomen soft, non-tender and normal bowel sounds MUSCULOSKELETAL:no cyanosis of digits and no clubbing  NEURO: alert & oriented x 3 with fluent speech, no focal motor/sensory deficits EXTREMITIES: No lower extremity edema    LABORATORY DATA:  I have reviewed the data as listed CMP Latest Ref Rng & Units 07/17/2017 06/17/2017 12/14/2016  Glucose 70 - 99 mg/dL 94 34(LL) 89  BUN 8 - 23 mg/dL 13 12 16   Creatinine 0.44 - 1.00 mg/dL 0.85 0.76 0.77  Sodium 135 - 145 mmol/L 141 142 138  Potassium 3.5 - 5.1 mmol/L 4.1 4.7 4.6  Chloride 98 - 111 mmol/L 106 101 102  CO2 22 - 32 mmol/L 27 22 25   Calcium 8.9 - 10.3 mg/dL 9.4 9.3 9.7  Total Protein 6.5 - 8.1 g/dL 6.6 6.3 6.1  Total Bilirubin 0.3 - 1.2 mg/dL 0.4 <0.2 0.3  Alkaline Phos 38 - 126 U/L 78 66 74  AST 15 - 41 U/L 22 22 21   ALT 0 - 44 U/L 21 17 15     Lab Results  Component Value Date   WBC 5.8 07/17/2017   HGB 14.6 07/17/2017   HCT 44.7 07/17/2017   MCV 99.3 07/17/2017   PLT 205 07/17/2017   NEUTROABS 2.9 07/17/2017    ASSESSMENT & PLAN:  Malignant neoplasm of upper-outer quadrant of right breast in female,  estrogen receptor positive ([place]) 08/22/2017:Right lumpectomy: IDC grade 1, 1.3 cm, margins negative, negative for lymphovascular or perineural invasion, 0/2 lymph nodes negative, ER 100%, PR 100%, Ki-67 2%, HER-2 negative ratio 1.29 T1CN0 stage Ia Oncotype DX recurrence score 0: Distant recurrence of 9 years: 3% Adjuvant radiation therapy 09/26/2017 to 10/22/2017  Treatment plan: Adjuvant antiestrogen therapy with letrozole 2.5 mg daily  Letrozole counseling: We discussed the risks and benefits of anti-estrogen therapy with aromatase inhibitors. These include but not limited to insomnia, hot flashes, mood changes, vaginal dryness, bone density loss, and weight gain. We strongly believe that the benefits far  outweigh the risks. Patient understands these risks and consented to starting treatment. Planned treatment duration is 5 years. Patient has a bone density scheduled by her primary care physician in December. Return to clinic in 3 months for survivorship care plan visit  No orders of the defined types were placed in this encounter.  The patient has a good understanding of the overall plan. she agrees with it. she will call with any problems that may develop before the next visit here.   Harriette Ohara, MD 10/22/17

## 2017-10-23 ENCOUNTER — Encounter: Payer: Self-pay | Admitting: *Deleted

## 2017-10-24 NOTE — Progress Notes (Signed)
  Radiation Oncology         [phone] ________________________________  Name: Cheryl Perry MRN: [id number]  Date: 10/22/2017  DOB: May 08, 1945  End of Treatment Note  Diagnosis:   72 y.o. female with Stage IA, pT1cN0M0, grade 1 ER/PR positive invasive ductal carcinoma of the right breast    Indication for treatment:  Curative       Radiation treatment dates:   09/25/2017 - 10/22/2017  Site/dose:    1. Right Breast / 42.56 Gy in 16 fractions 2. Right Breast Boost / 8 Gy in 4 fractions  Beams/energy:    1. 3D / 6X, 10X Photon 2. 3D / 15E Electron  Narrative: The patient tolerated radiation treatment relatively well.   She experienced increased fatigue and some expected skin changes with hyperpigmentation and moderate erythema. She continues to use her Radiaplex and she was encouraged to finish her Radiaplex and then switch over to vitamin E oil.    Plan: The patient has completed radiation treatment. The patient will return to radiation oncology clinic for routine followup in one month. I advised them to call or return sooner if they have any questions or concerns related to their recovery or treatment.  ------------------------------------------------  Jodelle Gross, MD, PhD  This document serves as a record of services personally performed by Kyung Rudd, MD. It was created on his behalf by Rae Lips, a trained medical scribe. The creation of this record is based on the scribe's personal observations and the provider's statements to them. This document has been checked and approved by the attending provider.

## 2017-10-31 DIAGNOSIS — J3089 Other allergic rhinitis: Secondary | ICD-10-CM | POA: Diagnosis not present

## 2017-10-31 DIAGNOSIS — J301 Allergic rhinitis due to pollen: Secondary | ICD-10-CM | POA: Diagnosis not present

## 2017-10-31 DIAGNOSIS — J3081 Allergic rhinitis due to animal (cat) (dog) hair and dander: Secondary | ICD-10-CM | POA: Diagnosis not present

## 2017-11-02 ENCOUNTER — Ambulatory Visit (INDEPENDENT_AMBULATORY_CARE_PROVIDER_SITE_OTHER): Payer: Medicare Other | Admitting: Nurse Practitioner

## 2017-11-02 ENCOUNTER — Encounter: Payer: Self-pay | Admitting: Nurse Practitioner

## 2017-11-02 VITALS — BP 122/80 | HR 94 | Temp 97.6°F | Ht 66.0 in | Wt 171.0 lb

## 2017-11-02 DIAGNOSIS — R350 Frequency of micturition: Secondary | ICD-10-CM

## 2017-11-02 DIAGNOSIS — N3001 Acute cystitis with hematuria: Secondary | ICD-10-CM

## 2017-11-02 MED ORDER — DOXYCYCLINE HYCLATE 100 MG PO TABS: 100.0000 mg | ORAL_TABLET | Freq: Two times a day (BID) | ORAL | 0 refills | Status: DC

## 2017-11-02 NOTE — Patient Instructions (Signed)

## 2017-11-02 NOTE — Progress Notes (Signed)
   Subjective:    Patient ID: Cheryl Perry, female    DOB: 1945-03-27, 72 y.o.   MRN: [id number]   Chief Complaint: Urinary Tract Infection (dark color)   HPI Patient comes in today c/o dark urinary with freq and urgency. She just completed radition treatments for breast cancer. She has been drinking lots f liquids and urine coitinues to darken.    Review of Systems  Constitutional: Positive for fever (101.3 oral).  Respiratory: Negative.   Cardiovascular: Negative.   Gastrointestinal: Positive for nausea (slight).  Genitourinary: Positive for frequency and urgency.  Neurological: Negative.   Psychiatric/Behavioral: Negative.   All other systems reviewed and are negative.      Objective:   Physical Exam  Constitutional: She is oriented to person, place, and time. She appears well-developed and well-nourished. No distress.  Cardiovascular: Normal rate.  Pulmonary/Chest: Effort normal.  Neurological: She is alert and oriented to person, place, and time.  Skin: Skin is warm.  Psychiatric: She has a normal mood and affect. Her behavior is normal. Thought content normal.  Nursing note and vitals reviewed.  BP 122/80   Pulse 94   Temp 97.6 F (36.4 C) (Oral)   Ht 5\' 6"  (1.676 m)   Wt 171 lb (77.6 kg)   BMI 27.60 kg/m   UA- leuks (3+) blood (1+)      Assessment & Plan:  [name] in today with chief complaint of Urinary Tract Infection (dark color)   1. Frequency of urination - Urinalysis  2. Acute cystitis with hematuria Take medication as prescribe Cotton underwear Take shower not bath Cranberry juice, yogurt Force fluids AZO over the counter X2 days Culture pending RTO prn  - doxycycline (VIBRA-TABS) 100 MG tablet; Take 1 tablet (100 mg total) by mouth 2 (two) times daily. 1 po bid  Dispense: 20 tablet; Refill: 0  Mary-Margaret Hassell Done, FNP

## 2017-11-04 ENCOUNTER — Telehealth: Payer: Self-pay | Admitting: Cardiology

## 2017-11-04 LAB — URINALYSIS
Bilirubin, UA: NEGATIVE
GLUCOSE, UA: NEGATIVE
Ketones, UA: NEGATIVE
NITRITE UA: NEGATIVE
PROTEIN UA: NEGATIVE
Specific Gravity, UA: 1.01 (ref 1.005–1.030)
UUROB: 1 mg/dL (ref 0.2–1.0)
pH, UA: 6 (ref 5.0–7.5)

## 2017-11-04 NOTE — Telephone Encounter (Signed)
Patient would like to discuss her medication  Crestor and her recently starting chemo treatments

## 2017-11-05 ENCOUNTER — Ambulatory Visit (INDEPENDENT_AMBULATORY_CARE_PROVIDER_SITE_OTHER): Payer: Medicare Other | Admitting: Family Medicine

## 2017-11-05 ENCOUNTER — Telehealth: Payer: Self-pay | Admitting: Family

## 2017-11-05 ENCOUNTER — Encounter: Payer: Self-pay | Admitting: Family Medicine

## 2017-11-05 VITALS — BP 114/78 | HR 94 | Temp 97.7°F | Ht 66.0 in | Wt 172.0 lb

## 2017-11-05 DIAGNOSIS — J40 Bronchitis, not specified as acute or chronic: Secondary | ICD-10-CM | POA: Diagnosis not present

## 2017-11-05 DIAGNOSIS — J329 Chronic sinusitis, unspecified: Secondary | ICD-10-CM | POA: Diagnosis not present

## 2017-11-05 MED ORDER — METHYLPREDNISOLONE 8 MG PO TABS: ORAL_TABLET | ORAL | 0 refills | Status: DC

## 2017-11-05 MED ORDER — AZITHROMYCIN 250 MG PO TABS
ORAL_TABLET | ORAL | 0 refills | Status: DC
Start: 2017-11-05 — End: 2017-12-12

## 2017-11-05 MED ORDER — AZITHROMYCIN 250 MG PO TABS: ORAL_TABLET | ORAL | 0 refills | Status: DC

## 2017-11-05 NOTE — Telephone Encounter (Signed)
Pt made aware. She voiced understanding.  

## 2017-11-05 NOTE — Telephone Encounter (Signed)
Im ok with that  Zandra Abts MD

## 2017-11-05 NOTE — Telephone Encounter (Signed)
Follow up call, patient taking crestor once a week and is doing well.Starting new med, letrozole which can raise cholesterol.She wants you to know. Patient wants to wait a bit before raising crestor times per week

## 2017-11-05 NOTE — Telephone Encounter (Signed)
Patient called stating she was still having dark urine with her UTI that she was seen for Saturday. States now she has cough with some SOB. Patient states that she took her bp and it was 108/something but was concerned about her pulse rate being 104.  Patient states when she over exerts herself she has SOB.  Apt made at 1:55 and aware to re take her vitals. Patient aware if she starts having worsening SOB and high pulse rate to go to ER but if not come in to apt today.

## 2017-11-05 NOTE — Progress Notes (Signed)
Chief Complaint  Patient presents with  . Cough    HPI  Patient presents today for cough increasing for 5 days. Seen 3 days ago for UTI. Those sx better with doxycycline, but no improvement noted with respiratory sx. Pt. Has recently finished a course of radiation for breast cancer.   PMH: Smoking status noted ROS: Per HPI  Objective: BP 114/78   Pulse 94   Temp 97.7 F (36.5 C) (Oral)   Ht 5\' 6"  (1.676 m)   Wt 172 lb (78 kg)   BMI 27.76 kg/m  Gen: NAD, alert, cooperative with exam HEENT: NCAT, EOMI, PERRL CV: RRR, good S1/S2, no murmur Resp: bronchitis changes with scattered wheezing Ext: No edema, warm Neuro: Alert and oriented, No gross deficits  Assessment and plan:  1. Sinobronchitis     Meds ordered this encounter  Medications  . DISCONTD: azithromycin (ZITHROMAX Z-PAK) 250 MG tablet    Sig: Take two right away Then one a day for the next 4 days.    Dispense:  6 each    Refill:  0  . DISCONTD: methylPREDNISolone (MEDROL) 8 MG tablet    Sig: 4 daily for 2 days, then 3 daily for 2 days, then 2 daily for 2 days, then 1 daily for 2 days    Dispense:  20 tablet    Refill:  0  . azithromycin (ZITHROMAX Z-PAK) 250 MG tablet    Sig: Take two right away Then one a day for the next 4 days.    Dispense:  6 each    Refill:  0  . methylPREDNISolone (MEDROL) 8 MG tablet    Sig: 4 daily for 2 days, then 3 daily for 2 days, then 2 daily for 2 days, then 1 daily for 2 days    Dispense:  20 tablet    Refill:  0    No orders of the defined types were placed in this encounter.   Follow up as needed.  Claretta Fraise, MD

## 2017-11-08 DIAGNOSIS — J3089 Other allergic rhinitis: Secondary | ICD-10-CM | POA: Diagnosis not present

## 2017-11-08 DIAGNOSIS — J3081 Allergic rhinitis due to animal (cat) (dog) hair and dander: Secondary | ICD-10-CM | POA: Diagnosis not present

## 2017-11-08 DIAGNOSIS — J301 Allergic rhinitis due to pollen: Secondary | ICD-10-CM | POA: Diagnosis not present

## 2017-11-25 DIAGNOSIS — J301 Allergic rhinitis due to pollen: Secondary | ICD-10-CM | POA: Diagnosis not present

## 2017-11-25 DIAGNOSIS — J3081 Allergic rhinitis due to animal (cat) (dog) hair and dander: Secondary | ICD-10-CM | POA: Diagnosis not present

## 2017-11-25 DIAGNOSIS — J3089 Other allergic rhinitis: Secondary | ICD-10-CM | POA: Diagnosis not present

## 2017-11-26 ENCOUNTER — Ambulatory Visit: Payer: Self-pay | Admitting: Radiation Oncology

## 2017-11-27 ENCOUNTER — Ambulatory Visit: Payer: Self-pay | Admitting: Radiation Oncology

## 2017-12-09 ENCOUNTER — Other Ambulatory Visit: Payer: Self-pay

## 2017-12-09 ENCOUNTER — Encounter: Payer: Self-pay | Admitting: Radiation Oncology

## 2017-12-09 ENCOUNTER — Ambulatory Visit
Admission: RE | Admit: 2017-12-09 | Discharge: 2017-12-09 | Disposition: A | Discharge status: Home / Self Care | Payer: Medicare Other | Source: Ambulatory Visit | Attending: Radiation Oncology | Admitting: Radiation Oncology

## 2017-12-09 DIAGNOSIS — J3081 Allergic rhinitis due to animal (cat) (dog) hair and dander: Secondary | ICD-10-CM | POA: Diagnosis not present

## 2017-12-09 DIAGNOSIS — Z79890 Hormone replacement therapy (postmenopausal): Secondary | ICD-10-CM | POA: Diagnosis not present

## 2017-12-09 DIAGNOSIS — Z79899 Other long term (current) drug therapy: Secondary | ICD-10-CM | POA: Diagnosis not present

## 2017-12-09 DIAGNOSIS — Z170 Estrogen receptor positive status [ER+]: Secondary | ICD-10-CM | POA: Insufficient documentation

## 2017-12-09 DIAGNOSIS — C50411 Malignant neoplasm of upper-outer quadrant of right female breast: Secondary | ICD-10-CM

## 2017-12-09 DIAGNOSIS — Z923 Personal history of irradiation: Secondary | ICD-10-CM | POA: Diagnosis not present

## 2017-12-09 DIAGNOSIS — C50911 Malignant neoplasm of unspecified site of right female breast: Secondary | ICD-10-CM | POA: Insufficient documentation

## 2017-12-09 DIAGNOSIS — J3089 Other allergic rhinitis: Secondary | ICD-10-CM | POA: Diagnosis not present

## 2017-12-09 DIAGNOSIS — J301 Allergic rhinitis due to pollen: Secondary | ICD-10-CM | POA: Diagnosis not present

## 2017-12-09 NOTE — Progress Notes (Signed)
Radiation Oncology         [phone] ________________________________  Name: Cheryl Perry MRN: [id number]  Date of Service: 12/09/2017  DOB: 04-Aug-1945  Post Treatment Note  CC: Sharion Balloon, FNP  Nicholas Lose, MD  Diagnosis:  Stage IA,pT1cN0M0, grade1ER/PR positive invasive ductal carcinoma of the right breast     Interval Since Last Radiation: 7 weeks   09/25/2017 - 10/22/2017: 1. Right Breast / 42.56 Gy in 16 fractions 2. Right Breast Boost / 8 Gy in 4 fractions  Narrative:  The patient returns today for routine follow-up. During treatment she did very well with radiotherapy and did not have significant desquamation.                             On review of systems, the patient states she's doing well. She has had some moles come about on the right breast since radiation. She denies any other concerns and is tolerating letrozole well. No other complaints are verbalized.  ALLERGIES:  is allergic to naproxen; ampicillin; avelox [moxifloxacin hcl in nacl]; bactrim [sulfamethoxazole-trimethoprim]; levaquin [levofloxacin in d5w]; lipitor [atorvastatin]; macrodantin [nitrofurantoin macrocrystal]; premarin [conjugated estrogens]; trental [pentoxifylline er]; and livalo [pitavastatin].  Meds: Current Outpatient Medications  Medication Sig Dispense Refill  . albuterol (PROVENTIL HFA) 108 (90 BASE) MCG/ACT inhaler Inhale 2 puffs into the lungs every 6 (six) hours as needed for wheezing. May use only 3 times a month    . azithromycin (ZITHROMAX Z-PAK) 250 MG tablet Take two right away Then one a day for the next 4 days. 6 each 0  . B Complex Vitamins (VITAMIN B COMPLEX PO) Take 1 tablet by mouth daily.    Marland Kitchen CALCIUM-MAGNESIUM-ZINC PO Take 1 tablet by mouth daily.    . Cholecalciferol (VITAMIN D) 2000 UNITS CAPS Take 1 capsule by mouth daily.    Marland Kitchen doxycycline (VIBRA-TABS) 100 MG tablet Take 1 tablet (100 mg total) by mouth 2 (two) times daily. 1 po bid 20 tablet 0  .  EPINEPHrine 0.3 mg/0.3 mL IJ SOAJ injection See admin instructions.  0  . fluticasone-salmeterol (ADVAIR HFA) 45-21 MCG/ACT inhaler Inhale 2 puffs into the lungs 2 (two) times daily. 3 Inhaler 3  . HYDROcodone-acetaminophen (NORCO/VICODIN) 5-325 MG tablet Take 1 tablet by mouth every 6 (six) hours as needed for moderate pain. 10 tablet 0  . letrozole (FEMARA) 2.5 MG tablet Take 1 tablet (2.5 mg total) by mouth daily. 90 tablet 3  . levothyroxine (SYNTHROID, LEVOTHROID) 75 MCG tablet TAKE 1 TABLET (75 MCG TOTAL)  BY MOUTH DAILY BEFORE BREAKFAST. 90 tablet 3  . loratadine (CLARITIN) 10 MG tablet Take 10 mg by mouth daily.    . methocarbamol (ROBAXIN) 500 MG tablet Take 1 tablet (500 mg total) by mouth 3 (three) times daily. (Patient taking differently: Take 500 mg by mouth every 8 (eight) hours as needed for muscle spasms. ) 90 tablet 1  . methocarbamol (ROBAXIN) 750 MG tablet Take 750 mg by mouth 3 (three) times daily as needed.  2  . methylPREDNISolone (MEDROL) 8 MG tablet 4 daily for 2 days, then 3 daily for 2 days, then 2 daily for 2 days, then 1 daily for 2 days 20 tablet 0  . montelukast (SINGULAIR) 10 MG tablet Take 10 mg by mouth at bedtime.    . pantoprazole (PROTONIX) 40 MG tablet Take 1 tablet (40 mg total) by mouth daily before breakfast. 90 tablet 3  . Probiotic  Product (PROBIOTIC ADVANCED PO) Take 1 capsule by mouth daily.     . rosuvastatin (CRESTOR) 5 MG tablet TAKE 1 TABLET EVERY WEEK TO BEGIN 30 tablet 0   No current facility-administered medications for this encounter.     Physical Findings:  height is 5\' 6"  (1.676 m) and weight is 168 lb 6.4 oz (76.4 kg). Her oral temperature is 98.2 F (36.8 C). Her blood pressure is 127/79 and her pulse is 59 (abnormal). Her respiration is 20 and oxygen saturation is 99%.  Pain Assessment Pain Score: 0-No pain/10 In general this is a well appearing caucasian female in no acute distress. She's alert and oriented x4 and appropriate throughout  the examination. Cardiopulmonary assessment is negative for acute distress and she exhibits normal effort. The right breast was examined and reveals no evidence of erythema or desqumation. She does have seborrheic keratotic changes of the breast.    Lab Findings: Lab Results  Component Value Date   WBC 5.8 07/17/2017   HGB 14.6 07/17/2017   HCT 44.7 07/17/2017   MCV 99.3 07/17/2017   PLT 205 07/17/2017     Radiographic Findings: No results found.  Impression/Plan: 1. Stage IA,pT1cN0M0, grade1ER/PR positive invasive ductal carcinoma of the right breast. The patient has been doing well since completion of radiotherapy. We discussed that we would be happy to continue to follow her as needed, but she will also continue to follow up with Dr. Lindi Adie in medical oncology. She was counseled on skin care as well as measures to avoid sun exposure to this area.  2. Survivorship. We discussed the importance of survivorship evaluation and she is currently scheduled for this in the near future. She was also given the monthly calendar for access to resources offered within the cancer center.     Carola Rhine, PAC

## 2017-12-12 ENCOUNTER — Other Ambulatory Visit: Payer: Self-pay | Admitting: *Deleted

## 2017-12-12 ENCOUNTER — Ambulatory Visit (INDEPENDENT_AMBULATORY_CARE_PROVIDER_SITE_OTHER): Payer: Medicare Other | Admitting: Gastroenterology

## 2017-12-12 ENCOUNTER — Encounter: Payer: Self-pay | Admitting: Gastroenterology

## 2017-12-12 ENCOUNTER — Encounter: Payer: Self-pay | Admitting: *Deleted

## 2017-12-12 VITALS — BP 136/82 | HR 62 | Temp 97.5°F | Ht 66.0 in | Wt 168.0 lb

## 2017-12-12 DIAGNOSIS — Z86010 Personal history of colonic polyps: Secondary | ICD-10-CM

## 2017-12-12 DIAGNOSIS — K219 Gastro-esophageal reflux disease without esophagitis: Secondary | ICD-10-CM

## 2017-12-12 DIAGNOSIS — Z860101 Personal history of adenomatous and serrated colon polyps: Secondary | ICD-10-CM

## 2017-12-12 MED ORDER — NA SULFATE-K SULFATE-MG SULF 17.5-3.13-1.6 GM/177ML PO SOLN: 1.0000 | ORAL | 0 refills | Status: DC

## 2017-12-12 NOTE — Patient Instructions (Signed)
1. Colonoscopy as scheduled. See separate instructions.  2. Continue pantoprazole once daily before a meal.

## 2017-12-12 NOTE — Progress Notes (Signed)
CC'D TO PCP °

## 2017-12-12 NOTE — Assessment & Plan Note (Signed)
Due for surveillance colonoscopy.  I have discussed the risks, alternatives, benefits with regards to but not limited to the risk of reaction to medication, bleeding, infection, perforation and the patient is agreeable to proceed. Written consent to be obtained.  Patient reminds me that she is not able to have any procedures done on her right arm.

## 2017-12-12 NOTE — Progress Notes (Signed)
Primary Care Physician: Sharion Balloon, FNP  Primary Gastroenterologist:  Garfield Cornea, MD   Chief Complaint  Patient presents with  . Gastroesophageal Reflux    "well controlled"    HPI: Cheryl Perry is a 72 y.o. female here for follow-up.  She was last seen in June.  She has a history of upper GI bleed in early 2017 secondary to multiple NSAID induced gastric ulcers.  Follow-up EGD in May 2017 showed completely healing of the ulcers.  She reports being due for next colonoscopy in 2020 with history of polyps and hemorrhoids.  At her last office visit we switched her from ranitidine to Protonix due to poorly controlled reflux.  Since we last saw her she was diagnosed with breast cancer.  She has had lumpectomy and finished radiation treatments.  She is now on Femara.  She did not require chemotherapy.  She reports that her oncologist is encouraging her to go ahead and pursue one last colonoscopy.  She reports losing 2 lymph nodes on the right side, cannot have any procedures done on the right arm.  Last colonoscopy was in 2015, she had diverticulosis and hemorrhoids.  On previous colonoscopy she had had adenomatous colon polyps therefore encouraged 5-year surveillance colonoscopies.  From a GI standpoint she is doing well.  Her reflux is well controlled.  No dysphagia.  No abdominal pain.  Bowel function unremarkable.  Daily bowel movement.  No melena or rectal bleeding.     Current Outpatient Medications  Medication Sig Dispense Refill  . albuterol (PROVENTIL HFA) 108 (90 BASE) MCG/ACT inhaler Inhale 2 puffs into the lungs every 6 (six) hours as needed for wheezing. May use only 3 times a month    . B Complex Vitamins (VITAMIN B COMPLEX PO) Take 1 tablet by mouth daily.    . Calcium Carbonate (CALCIUM 600 PO) Take by mouth 2 (two) times daily.    . Cholecalciferol (VITAMIN D) 2000 UNITS CAPS Take 1 capsule by mouth daily.    Marland Kitchen EPINEPHrine 0.3 mg/0.3 mL IJ SOAJ injection  See admin instructions.  0  . fluticasone-salmeterol (ADVAIR HFA) 45-21 MCG/ACT inhaler Inhale 2 puffs into the lungs 2 (two) times daily. (Patient taking differently: Inhale 2 puffs into the lungs as needed. ) 3 Inhaler 3  . letrozole (FEMARA) 2.5 MG tablet Take 1 tablet (2.5 mg total) by mouth daily. 90 tablet 3  . levothyroxine (SYNTHROID, LEVOTHROID) 75 MCG tablet TAKE 1 TABLET (75 MCG TOTAL)  BY MOUTH DAILY BEFORE BREAKFAST. 90 tablet 3  . loratadine (CLARITIN) 10 MG tablet Take 10 mg by mouth daily.    . methocarbamol (ROBAXIN) 750 MG tablet Take 750 mg by mouth 3 (three) times daily as needed.  2  . pantoprazole (PROTONIX) 40 MG tablet Take 1 tablet (40 mg total) by mouth daily before breakfast. 90 tablet 3  . Probiotic Product (PROBIOTIC ADVANCED PO) Take 1 capsule by mouth daily.     . rosuvastatin (CRESTOR) 5 MG tablet TAKE 1 TABLET EVERY WEEK TO BEGIN 30 tablet 0   No current facility-administered medications for this visit.     Allergies as of 12/12/2017 - Review Complete 12/12/2017  Allergen Reaction Noted  . Naproxen Hives and Itching 09/02/2013  . Ampicillin  06/05/2012  . Avelox [moxifloxacin hcl in nacl]  06/05/2012  . Bactrim [sulfamethoxazole-trimethoprim]  06/05/2012  . Levaquin [levofloxacin in d5w]  06/05/2012  . Lipitor [atorvastatin]  06/05/2012  . Macrodantin [nitrofurantoin macrocrystal]  01/16/1985  .  Premarin [conjugated estrogens]  06/05/2012  . Trental [pentoxifylline er]  06/05/2012  . Livalo [pitavastatin] Other (See Comments) 01/06/2016   Past Medical History:  Diagnosis Date  . Allergy   . Arthritis    back painherniated disc lumbar  . Asthma   . Back pain   . Cancer ([facility]) 07/2017   right breast cancer, lumpectomy and 20 sessions of XRT  . Complication of anesthesia   . GERD (gastroesophageal reflux disease)   . Hiatal hernia   . Hyperlipidemia   . Hypothyroidism   . Osteopenia   . PONV (postoperative nausea and vomiting)    asks for scop  patch  . Thyroid disease   . Vasculopathy    lymphatic   Past Surgical History:  Procedure Laterality Date  . BREAST LUMPECTOMY WITH RADIOACTIVE SEED AND SENTINEL LYMPH NODE BIOPSY Right 08/22/2017   Procedure: BREAST LUMPECTOMY WITH RADIOACTIVE SEED AND SENTINEL LYMPH NODE BIOPSY;  Surgeon: Erroll Luna, MD;  Location: [facility];  Service: General;  Laterality: Right;  . COLONOSCOPY  2015   Dr. Earlean Shawl: Hemorrhoids, diverticulosis.  Next colonoscopy in 5 years due to previous history of adenomatous colon polyps  . CYSTOSCOPY    . cystscopy    . ESOPHAGOGASTRODUODENOSCOPY N/A 01/28/2015   Dr.Rourk- 3 small prepyloric/gastric ulcers- likely culprits causing bleeding. hiatal hernia, small gastic polyps not manipulated.  . ESOPHAGOGASTRODUODENOSCOPY N/A 05/26/2015   Dr. Gala Romney: Small hiatal hernia, previous gastric ulcers completely healed.  Marland Kitchen KNEE SURGERY    . TONSILLECTOMY    . TUBAL LIGATION     Family History  Problem Relation Age of Onset  . Stroke Mother   . Hypertension Mother   . Alzheimer's disease Mother   . Heart disease Sister   . Hypertension Sister   . Heart attack Sister        Psychologist, forensic   . Arthritis Daughter        very bad MVA   . Hypertension Son   . Seizures Son    Social History   Tobacco Use  . Smoking status: Former Smoker    Last attempt to quit: 06/06/1974    Years since quitting: 43.5  . Smokeless tobacco: Never Used  . Tobacco comment: Quit x 40 years ago  Substance Use Topics  . Alcohol use: No    Alcohol/week: 0.0 standard drinks  . Drug use: No    ROS:  General: Negative for anorexia, weight loss, fever, chills, fatigue, weakness. ENT: Negative for hoarseness, difficulty swallowing , nasal congestion. CV: Negative for chest pain, angina, palpitations, dyspnea on exertion, peripheral edema.  Respiratory: Negative for dyspnea at rest, dyspnea on exertion, cough, sputum, wheezing.  GI: See history of present illness. GU:   Negative for dysuria, hematuria, urinary incontinence, urinary frequency, nocturnal urination.  Endo: Negative for unusual weight change.    Physical Examination:   BP 136/82   Pulse 62   Temp (!) 97.5 F (36.4 C) (Oral)   Ht 5\' 6"  (1.676 m)   Wt 168 lb (76.2 kg)   BMI 27.12 kg/m   General: Well-nourished, well-developed in no acute distress.  Eyes: No icterus. Mouth: Oropharyngeal mucosa moist and pink , no lesions erythema or exudate. Lungs: Clear to auscultation bilaterally.  Heart: Regular rate and rhythm, no murmurs rubs or gallops.  Abdomen: Bowel sounds are normal, nontender, nondistended, no hepatosplenomegaly or masses, no abdominal bruits or hernia , no rebound or guarding.   Extremities: No lower extremity edema. No clubbing  or deformities. Neuro: Alert and oriented x 4   Skin: Warm and dry, no jaundice.   Psych: Alert and cooperative, normal mood and affect.  Labs:  Lab Results  Component Value Date   CREATININE 0.85 07/17/2017   BUN 13 07/17/2017   NA 141 07/17/2017   K 4.1 07/17/2017   CL 106 07/17/2017   CO2 27 07/17/2017   Lab Results  Component Value Date   ALT 21 07/17/2017   AST 22 07/17/2017   ALKPHOS 78 07/17/2017   BILITOT 0.4 07/17/2017   Lab Results  Component Value Date   WBC 5.8 07/17/2017   HGB 14.6 07/17/2017   HCT 44.7 07/17/2017   MCV 99.3 07/17/2017   PLT 205 07/17/2017    Imaging Studies: No results found.

## 2017-12-12 NOTE — Assessment & Plan Note (Signed)
Continue pantoprazole 40 mg daily.

## 2017-12-17 ENCOUNTER — Ambulatory Visit (INDEPENDENT_AMBULATORY_CARE_PROVIDER_SITE_OTHER): Payer: Medicare Other | Admitting: Family

## 2017-12-17 ENCOUNTER — Encounter: Payer: Self-pay | Admitting: Family

## 2017-12-17 VITALS — BP 117/76 | HR 74 | Temp 96.7°F | Ht 66.0 in | Wt 167.6 lb

## 2017-12-17 DIAGNOSIS — K219 Gastro-esophageal reflux disease without esophagitis: Secondary | ICD-10-CM

## 2017-12-17 DIAGNOSIS — C50411 Malignant neoplasm of upper-outer quadrant of right female breast: Secondary | ICD-10-CM | POA: Diagnosis not present

## 2017-12-17 DIAGNOSIS — J45909 Unspecified asthma, uncomplicated: Secondary | ICD-10-CM

## 2017-12-17 DIAGNOSIS — Z170 Estrogen receptor positive status [ER+]: Secondary | ICD-10-CM

## 2017-12-17 DIAGNOSIS — E785 Hyperlipidemia, unspecified: Secondary | ICD-10-CM

## 2017-12-17 DIAGNOSIS — E038 Other specified hypothyroidism: Secondary | ICD-10-CM

## 2017-12-17 DIAGNOSIS — E663 Overweight: Secondary | ICD-10-CM

## 2017-12-17 DIAGNOSIS — M8589 Other specified disorders of bone density and structure, multiple sites: Secondary | ICD-10-CM

## 2017-12-17 NOTE — Patient Instructions (Signed)
Hypothyroidism Hypothyroidism is a disorder of the thyroid. The thyroid is a large gland that is located in the lower front of the neck. The thyroid releases hormones that control how the body works. With hypothyroidism, the thyroid does not make enough of these hormones. What are the causes? Causes of hypothyroidism may include:  Viral infections.  Pregnancy.  Your own defense system (immune system) attacking your thyroid.  Certain medicines.  Birth defects.  Past radiation treatments to your head or neck.  Past treatment with radioactive iodine.  Past surgical removal of part or all of your thyroid.  Problems with the gland that is located in the center of your brain (pituitary).  What are the signs or symptoms? Signs and symptoms of hypothyroidism may include:  Feeling as though you have no energy (lethargy).  Inability to tolerate cold.  Weight gain that is not explained by a change in diet or exercise habits.  Dry skin.  Coarse hair.  Menstrual irregularity.  Slowing of thought processes.  Constipation.  Sadness or depression.  How is this diagnosed? Your health care provider may diagnose hypothyroidism with blood tests and ultrasound tests. How is this treated? Hypothyroidism is treated with medicine that replaces the hormones that your body does not make. After you begin treatment, it may take several weeks for symptoms to go away. Follow these instructions at home:  Take medicines only as directed by your health care provider.  If you start taking any new medicines, tell your health care provider.  Keep all follow-up visits as directed by your health care provider. This is important. As your condition improves, your dosage needs may change. You will need to have blood tests regularly so that your health care provider can watch your condition. Contact a health care provider if:  Your symptoms do not get better with treatment.  You are taking thyroid  replacement medicine and: ? You sweat excessively. ? You have tremors. ? You feel anxious. ? You lose weight rapidly. ? You cannot tolerate heat. ? You have emotional swings. ? You have diarrhea. ? You feel weak. Get help right away if:  You develop chest pain.  You develop an irregular heartbeat.  You develop a rapid heartbeat. This information is not intended to replace advice given to you by your health care provider. Make sure you discuss any questions you have with your health care provider. Document Released: 12/25/2004 Document Revised: 06/02/2015 Document Reviewed: 05/12/2013 Elsevier Interactive Patient Education  2018 Elsevier Inc.  

## 2017-12-17 NOTE — Progress Notes (Signed)
Subjective:    Patient ID: Cheryl Perry, female    DOB: 08-22-1945, 72 y.o.   MRN: [id number]  Chief Complaint  Patient presents with  . Medical Management of Chronic Issues    six month recheck   Pt presents to the office today for chronic follow up.Pt had a acute GI bleed in January2017, and is been followed by Cheryl Perry. Has colonoscopy in January 2020. Pt is followed by allergen specialists annually for her asthma.PT is followed by Ortho as needed for chronic back pain.   She is followed by Oncologists every 6 months for right breast cancer. She completed radiation in 10/29/17.  Gastroesophageal Reflux  She complains of coughing and a hoarse voice. She reports no belching, no dysphagia or no wheezing. This is a chronic problem. The current episode started more than 1 year ago. The problem occurs occasionally. The problem has been waxing and waning. The symptoms are aggravated by certain foods. Associated symptoms include fatigue. She has tried a PPI for the symptoms. The treatment provided moderate relief.  Asthma  She complains of cough and hoarse voice. There is no difficulty breathing or wheezing. This is a chronic problem. The current episode started more than 1 year ago. Her symptoms are alleviated by rest. She reports moderate improvement on treatment. Her symptoms are not alleviated by rest (advair). Her past medical history is significant for asthma.  Thyroid Problem  Presents for follow-up visit. Symptoms include constipation, fatigue and hoarse voice. Patient reports no diarrhea. The symptoms have been stable. Her past medical history is significant for hyperlipidemia.  Hyperlipidemia  This is a chronic problem. The current episode started more than 1 year ago. The problem is uncontrolled. Recent lipid tests were reviewed and are high. Current antihyperlipidemic treatment includes statins. The current treatment provides moderate improvement of lipids. Risk factors for  coronary artery disease include dyslipidemia, a sedentary lifestyle and post-menopausal.  Back Pain  This is a chronic problem. The current episode started more than 1 year ago. The problem occurs intermittently. The problem has been waxing and waning since onset. The pain is present in the lumbar spine. The quality of the pain is described as aching. The pain is at a severity of 2/10. The pain is moderate.  Osteopenia Pt's last Dexa scan 12/15/15. Taking calcium and vit D daily.     Review of Systems  Constitutional: Positive for fatigue.  HENT: Positive for hoarse voice.   Respiratory: Positive for cough. Negative for wheezing.   Gastrointestinal: Positive for constipation. Negative for diarrhea and dysphagia.  Musculoskeletal: Positive for back pain.  All other systems reviewed and are negative.      Objective:   Physical Exam  Constitutional: She is oriented to person, place, and time. She appears well-developed and well-nourished. No distress.  HENT:  Head: Normocephalic and atraumatic.  Right Ear: External ear normal.  Left Ear: External ear normal.  Mouth/Throat: Oropharynx is clear and moist.  Eyes: Pupils are equal, round, and reactive to light.  Neck: Normal range of motion. Neck supple. No thyromegaly present.  Cardiovascular: Normal rate, regular rhythm, normal heart sounds and intact distal pulses.  No murmur heard. Pulmonary/Chest: Effort normal and breath sounds normal. No respiratory distress. She has no wheezes.  Abdominal: Soft. Bowel sounds are normal. She exhibits no distension. There is no tenderness.  Musculoskeletal: She exhibits no edema or tenderness.  Neurological: She is alert and oriented to person, place, and time. She has normal reflexes. No cranial nerve  deficit.  Skin: Skin is warm and dry.  Psychiatric: She has a normal mood and affect. Her behavior is normal. Judgment and thought content normal.  Vitals reviewed.     BP 117/76   Pulse 74    Temp (!) 96.7 F (35.9 C) (Oral)   Ht 5' 6"  (1.676 m)   Wt 167 lb 9.6 oz (76 kg)   BMI 27.05 kg/m      Assessment & Plan:  Cheryl Perry comes in today with chief complaint of Medical Management of Chronic Issues (six month recheck)   Diagnosis and orders addressed:  1. Uncomplicated asthma, unspecified asthma severity, unspecified whether persistent - CMP14+EGFR  2. Gastroesophageal reflux disease without esophagitis - CMP14+EGFR  3. Other specified hypothyroidism - CMP14+EGFR - TSH  4. Hyperlipidemia, unspecified hyperlipidemia type - CMP14+EGFR - Lipid panel  5. Overweight (BMI 25.0-29.9) - CMP14+EGFR  6. Malignant neoplasm of upper-outer quadrant of right breast in female, estrogen receptor positive (HCC) - CMP14+EGFR  7. Osteopenia of multiple sites - [facility] DEXA   Labs pending Health Maintenance reviewed Diet and exercise encouraged  Follow up plan: 6 months and keep follow up with specialists    Cheryl Dun, FNP

## 2017-12-18 ENCOUNTER — Ambulatory Visit (INDEPENDENT_AMBULATORY_CARE_PROVIDER_SITE_OTHER): Payer: Medicare Other

## 2017-12-18 DIAGNOSIS — M8588 Other specified disorders of bone density and structure, other site: Secondary | ICD-10-CM | POA: Diagnosis not present

## 2017-12-18 DIAGNOSIS — M85851 Other specified disorders of bone density and structure, right thigh: Secondary | ICD-10-CM | POA: Diagnosis not present

## 2017-12-18 LAB — CMP14+EGFR
A/G RATIO: 2 (ref 1.2–2.2)
ALBUMIN: 3.9 g/dL (ref 3.5–4.8)
ALT: 337 IU/L — ABNORMAL HIGH (ref 0–32)
AST: 465 IU/L — AB (ref 0–40)
Alkaline Phosphatase: 158 IU/L — ABNORMAL HIGH (ref 39–117)
BUN / CREAT RATIO: 14 (ref 12–28)
BUN: 11 mg/dL (ref 8–27)
Bilirubin Total: 0.8 mg/dL (ref 0.0–1.2)
CALCIUM: 9.6 mg/dL (ref 8.7–10.3)
CO2: 20 mmol/L (ref 20–29)
CREATININE: 0.79 mg/dL (ref 0.57–1.00)
Chloride: 107 mmol/L — ABNORMAL HIGH (ref 96–106)
GFR calc Af Amer: 86 mL/min/{1.73_m2} (ref >59)
GFR, EST NON AFRICAN AMERICAN: 75 mL/min/{1.73_m2} (ref >59)
GLOBULIN, TOTAL: 2 g/dL (ref 1.5–4.5)
Glucose: 87 mg/dL (ref 65–99)
POTASSIUM: 4.3 mmol/L (ref 3.5–5.2)
SODIUM: 143 mmol/L (ref 134–144)
Total Protein: 5.9 g/dL — ABNORMAL LOW (ref 6.0–8.5)

## 2017-12-18 LAB — LIPID PANEL
CHOL/HDL RATIO: 3 ratio (ref 0.0–4.4)
Cholesterol, Total: 211 mg/dL — ABNORMAL HIGH (ref 100–199)
HDL: 71 mg/dL (ref >39)
LDL Calculated: 121 mg/dL — ABNORMAL HIGH (ref 0–99)
TRIGLYCERIDES: 93 mg/dL (ref 0–149)
VLDL Cholesterol Cal: 19 mg/dL (ref 5–40)

## 2017-12-18 LAB — TSH: TSH: 0.542 u[IU]/mL (ref 0.450–4.500)

## 2017-12-19 ENCOUNTER — Other Ambulatory Visit: Payer: Self-pay | Admitting: Family

## 2017-12-23 ENCOUNTER — Other Ambulatory Visit: Payer: Self-pay

## 2017-12-23 ENCOUNTER — Other Ambulatory Visit: Payer: Medicare Other

## 2017-12-23 ENCOUNTER — Telehealth: Payer: Self-pay

## 2017-12-23 DIAGNOSIS — R7401 Elevation of levels of liver transaminase levels: Secondary | ICD-10-CM

## 2017-12-23 DIAGNOSIS — R740 Nonspecific elevation of levels of transaminase and lactic acid dehydrogenase [LDH]: Principal | ICD-10-CM

## 2017-12-23 NOTE — Addendum Note (Signed)
Addended by: Derrick Ravel on: 02/58/5277 09:20 AM   Modules accepted: Orders

## 2017-12-23 NOTE — Telephone Encounter (Signed)
Cheryl Perry, Yep. These elevated aminotransferases need urgent evaluation. Cheryl Perry - need repeat hepatic profile no later than 12/16. Cheryl Perry, anticipate need for urgent work-in appt in very near future.   Thanks [employer]...will let you know what we come up with. Cheryl Perry    Previous Messages       Spoke with pt, she's going to [place] to have her labs drawn. Lab orders were placed and faxed to [facility]. They are aware that pt is coming by. Pt has a scheduled apt for early January 2020.

## 2017-12-24 LAB — HEPATIC FUNCTION PANEL
ALBUMIN: 3.9 g/dL (ref 3.5–4.8)
ALT: 861 IU/L (ref 0–32)
AST: 1077 IU/L — AB (ref 0–40)
Alkaline Phosphatase: 242 IU/L — ABNORMAL HIGH (ref 39–117)
BILIRUBIN TOTAL: 1.4 mg/dL — AB (ref 0.0–1.2)
Bilirubin, Direct: 0.94 mg/dL — ABNORMAL HIGH (ref 0.00–0.40)
TOTAL PROTEIN: 5.8 g/dL — AB (ref 6.0–8.5)

## 2017-12-25 ENCOUNTER — Encounter: Payer: Self-pay | Admitting: Gastroenterology

## 2017-12-25 ENCOUNTER — Ambulatory Visit (HOSPITAL_COMMUNITY)
Admission: RE | Admit: 2017-12-25 | Discharge: 2017-12-25 | Disposition: A | Discharge status: Home / Self Care | Payer: Medicare Other | Source: Ambulatory Visit | Attending: Gastroenterology | Admitting: Gastroenterology

## 2017-12-25 ENCOUNTER — Other Ambulatory Visit (HOSPITAL_COMMUNITY)
Admission: RE | Admit: 2017-12-25 | Discharge: 2017-12-25 | Disposition: A | Discharge status: Home / Self Care | Payer: Medicare Other | Source: Ambulatory Visit | Attending: Gastroenterology | Admitting: Gastroenterology

## 2017-12-25 ENCOUNTER — Ambulatory Visit (INDEPENDENT_AMBULATORY_CARE_PROVIDER_SITE_OTHER): Payer: Medicare Other | Admitting: Gastroenterology

## 2017-12-25 ENCOUNTER — Telehealth: Payer: Self-pay

## 2017-12-25 DIAGNOSIS — R945 Abnormal results of liver function studies: Secondary | ICD-10-CM | POA: Insufficient documentation

## 2017-12-25 DIAGNOSIS — R7989 Other specified abnormal findings of blood chemistry: Secondary | ICD-10-CM

## 2017-12-25 DIAGNOSIS — K7689 Other specified diseases of liver: Secondary | ICD-10-CM | POA: Diagnosis not present

## 2017-12-25 LAB — CBC WITH DIFFERENTIAL/PLATELET
Abs Immature Granulocytes: 0.01 10*3/uL (ref 0.00–0.07)
BASOS ABS: 0.1 10*3/uL (ref 0.0–0.1)
BASOS PCT: 1 %
EOS ABS: 1.1 10*3/uL — AB (ref 0.0–0.5)
EOS PCT: 20 %
HCT: 45.4 % (ref 36.0–46.0)
Hemoglobin: 14.8 g/dL (ref 12.0–15.0)
IMMATURE GRANULOCYTES: 0 %
Lymphocytes Relative: 20 %
Lymphs Abs: 1.1 10*3/uL (ref 0.7–4.0)
MCH: 32.3 pg (ref 26.0–34.0)
MCHC: 32.6 g/dL (ref 30.0–36.0)
MCV: 99.1 fL (ref 80.0–100.0)
Monocytes Absolute: 0.6 10*3/uL (ref 0.1–1.0)
Monocytes Relative: 12 %
NEUTROS PCT: 47 %
NRBC: 0 % (ref 0.0–0.2)
Neutro Abs: 2.6 10*3/uL (ref 1.7–7.7)
PLATELETS: 153 10*3/uL (ref 150–400)
RBC: 4.58 MIL/uL (ref 3.87–5.11)
RDW: 14.2 % (ref 11.5–15.5)
WBC: 5.5 10*3/uL (ref 4.0–10.5)

## 2017-12-25 MED ORDER — IOPAMIDOL (ISOVUE-300) INJECTION 61%
100.0000 mL | Freq: Once | INTRAVENOUS | Status: AC | PRN
  Administered 2017-12-25: 100 mL via INTRAVENOUS

## 2017-12-25 NOTE — Assessment & Plan Note (Signed)
Very pleasant 72 year old female presenting with acute, worsening elevated transaminases, now markedly elevated with AST 1077 and ALT 861. Bilirubin slightly rising. She has no abdominal pain, no fever/chills. Concerning loss of appetite and taste changes. I am checking CBC today to assess for any leukocytosis or other abnormalities. CT abd/pelvis stat today. Discussed with patient in detail, and we will provide further recommendations once imaging is back. No concern for drug effect, as she denies any recent tylenol ingestion, and letrozole would not cause this marked elevation. Further recommendations to follow.

## 2017-12-25 NOTE — Progress Notes (Signed)
Referring Provider: Sharion Balloon, FNP Primary Care Physician:  Sharion Balloon, FNP Primary GI: Dr. Gala Romney   Chief Complaint  Patient presents with  . elevated LFT's    HPI:   Cheryl Perry is a 72 y.o. female presenting today with a history of UGI bleed in 2017 due to multiple NSAID-induced gastric ulcers. Follow-up EGD that showed healing. Chronic GERD. Recent diagnosis of breast cancer July 2019  with lumpectomy and completing radiation therapy. Unable to have any procedures on right arm. Last colonoscopy was in 2015, she had diverticulosis and hemorrhoids.  On previous colonoscopy she had had adenomatous colon polyps therefore encouraged 5-year surveillance colonoscopies. She is here due to markedly elevated transaminases, initially noted during routine labs mid Dec 2019, with AST 465, ALT 337, Alk Phos 158. Repeated the 16th with further progression to AST 1077, ALT 861, Alk Phos 242, Tbili 1.4, Direct 0.94.   No history of liver disease. Has herniated disc in L5-S1. No abdominal pain. Has back pain. Taking letrozole (Femara). Feels fatigued at times. Feels related to radiation. Improving. Walking 30 minutes a day. Appetite is off lately. Things taste funny. Symptoms present for 2-3 weeks of loss of appetite and taste changes. Started letrozole on Nov 10, 2017.   History of UTIs/kidney infections in the past. History of benign right renal cyst. Didn't use hydrocodone after surgery. Used minimal amount of tylenol after lumpectomy. In last month may have taken 3 tylenols total. No herbal supplements. No fever/chills. Purposefully trying to lose weight. Very lose dose crestor, taking it just once a week. Has been on since the summer. Has a metallic taste in mouth. Finished radiation Oct 22nd.   Normally her urine is clear, pale yellow. Now urine is a light tea-colored urine.   Has had augmentin, doxycline in the past.   Past Medical History:  Diagnosis Date  . Allergy   .  Arthritis    back painherniated disc lumbar  . Asthma   . Back pain   . Cancer ([place]) 07/2017   right breast cancer, lumpectomy and 20 sessions of XRT  . Complication of anesthesia   . GERD (gastroesophageal reflux disease)   . Hiatal hernia   . Hyperlipidemia   . Hypothyroidism   . Osteopenia   . PONV (postoperative nausea and vomiting)    asks for scop patch  . Thyroid disease   . Vasculopathy    lymphatic    Past Surgical History:  Procedure Laterality Date  . BREAST LUMPECTOMY WITH RADIOACTIVE SEED AND SENTINEL LYMPH NODE BIOPSY Right 08/22/2017   Procedure: BREAST LUMPECTOMY WITH RADIOACTIVE SEED AND SENTINEL LYMPH NODE BIOPSY;  Surgeon: Erroll Luna, MD;  Location: [facility];  Service: General;  Laterality: Right;  . COLONOSCOPY  2015   Dr. Earlean Shawl: Hemorrhoids, diverticulosis.  Next colonoscopy in 5 years due to previous history of adenomatous colon polyps  . CYSTOSCOPY    . cystscopy    . ESOPHAGOGASTRODUODENOSCOPY N/A 01/28/2015   Dr.Rourk- 3 small prepyloric/gastric ulcers- likely culprits causing bleeding. hiatal hernia, small gastic polyps not manipulated.  . ESOPHAGOGASTRODUODENOSCOPY N/A 05/26/2015   Dr. Gala Romney: Small hiatal hernia, previous gastric ulcers completely healed.  Marland Kitchen KNEE SURGERY    . TONSILLECTOMY    . TUBAL LIGATION      Current Outpatient Medications  Medication Sig Dispense Refill  . albuterol (PROVENTIL HFA) 108 (90 BASE) MCG/ACT inhaler Inhale 2 puffs into the lungs every 6 (six) hours as needed for wheezing.  May use only 3 times a month    . B Complex Vitamins (VITAMIN B COMPLEX PO) Take 1 tablet by mouth daily.    . Calcium Carbonate (CALCIUM 600 PO) Take by mouth 2 (two) times daily.    . Cholecalciferol (VITAMIN D) 2000 UNITS CAPS Take 1 capsule by mouth daily.    Marland Kitchen EPINEPHrine 0.3 mg/0.3 mL IJ SOAJ injection See admin instructions.  0  . fluticasone-salmeterol (ADVAIR HFA) 45-21 MCG/ACT inhaler Inhale 2 puffs into the lungs 2  (two) times daily. 3 Inhaler 3  . letrozole (FEMARA) 2.5 MG tablet Take 1 tablet (2.5 mg total) by mouth daily. 90 tablet 3  . levothyroxine (SYNTHROID, LEVOTHROID) 75 MCG tablet TAKE 1 TABLET (75 MCG TOTAL)  BY MOUTH DAILY BEFORE BREAKFAST. 90 tablet 3  . loratadine (CLARITIN) 10 MG tablet Take 10 mg by mouth daily.    . methocarbamol (ROBAXIN) 750 MG tablet Take 750 mg by mouth 3 (three) times daily as needed.  2  . pantoprazole (PROTONIX) 40 MG tablet Take 1 tablet (40 mg total) by mouth daily before breakfast. 90 tablet 3  . Probiotic Product (PROBIOTIC ADVANCED PO) Take 1 capsule by mouth daily.     . rosuvastatin (CRESTOR) 5 MG tablet TAKE 1 TABLET EVERY WEEK TO BEGIN 30 tablet 0   No current facility-administered medications for this visit.     Allergies as of 12/25/2017 - Review Complete 12/25/2017  Allergen Reaction Noted  . Naproxen Hives and Itching 09/02/2013  . Ampicillin  06/05/2012  . Avelox [moxifloxacin hcl in nacl]  06/05/2012  . Bactrim [sulfamethoxazole-trimethoprim]  06/05/2012  . Levaquin [levofloxacin in d5w]  06/05/2012  . Lipitor [atorvastatin]  06/05/2012  . Macrodantin [nitrofurantoin macrocrystal]  01/16/1985  . Premarin [conjugated estrogens]  06/05/2012  . Trental [pentoxifylline er]  06/05/2012  . Livalo [pitavastatin] Other (See Comments) 01/06/2016    Family History  Problem Relation Age of Onset  . Stroke Mother   . Hypertension Mother   . Alzheimer's disease Mother   . Heart disease Sister   . Hypertension Sister   . Heart attack Sister        Psychologist, forensic   . Arthritis Daughter        very bad MVA   . Hypertension Son   . Seizures Son   . Colon cancer Neg Hx   . Liver cancer Neg Hx   . Pancreatic disease Neg Hx     Social History   Socioeconomic History  . Marital status: Married    Spouse name: Not on file  . Number of children: Not on file  . Years of education: Not on file  . Highest education level: Not on file  Occupational  History  . Occupation: retired     Comment: HR work  / Astronomer   Social Needs  . Financial resource strain: Not on file  . Food insecurity:    Worry: Not on file    Inability: Not on file  . Transportation needs:    Medical: Not on file    Non-medical: Not on file  Tobacco Use  . Smoking status: Former Smoker    Last attempt to quit: 06/06/1974    Years since quitting: 43.5  . Smokeless tobacco: Never Used  . Tobacco comment: Quit x 40 years ago  Substance and Sexual Activity  . Alcohol use: No    Alcohol/week: 0.0 standard drinks  . Drug use: No  . Sexual activity: Not on  file  Lifestyle  . Physical activity:    Days per week: Not on file    Minutes per session: Not on file  . Stress: Not on file  Relationships  . Social connections:    Talks on phone: Not on file    Gets together: Not on file    Attends religious service: Not on file    Active member of club or organization: Not on file    Attends meetings of clubs or organizations: Not on file    Relationship status: Not on file  Other Topics Concern  . Not on file  Social History Narrative  . Not on file    Review of Systems: As mentioned in HPI   Physical Exam: BP 129/84   Pulse 75   Temp (!) 97.1 F (36.2 C) (Oral)   Ht 5' 6"  (1.676 m)   Wt 168 lb (76.2 kg)   BMI 27.12 kg/m  General:   Alert and oriented. No distress noted. Pleasant and cooperative.  Head:  Normocephalic and atraumatic. Eyes:  Conjuctiva clear without scleral icterus. Mouth:  Oral mucosa pink and moist.  Abdomen:  +BS, soft, non-tender and non-distended. No rebound or guarding. No HSM or masses noted. Msk:  Symmetrical without gross deformities. Normal posture. Extremities:  Without edema. Neurologic:  Alert and  oriented x4 Psych:  Alert and cooperative. Normal mood and affect.  Lab Results  Component Value Date   ALT 861 (HH) 12/23/2017   AST 1,077 (HH) 12/23/2017   ALKPHOS 242 (H) 12/23/2017   BILITOT 1.4  (H) 12/23/2017

## 2017-12-25 NOTE — Patient Instructions (Signed)
I have ordered a stat CT scan. I will be calling you with the results.  Please complete the blood work while you are waiting for the scan.  Further recommendations to follow!  It was a pleasure to see you today. I strive to create trusting relationships with patients to provide genuine, compassionate, and quality care. I value your feedback. If you receive a survey regarding your visit,  I greatly appreciate you taking time to fill this out.   Annitta Needs, PhD, ANP-BC [facility]

## 2017-12-25 NOTE — Telephone Encounter (Signed)
Received call from pt with concerns of her elevated liver enzymes. Pt has an appt with her GI dr this afternoon to discuss possible causes and future imaging studies. Pt wanted to know if letrozole can cause liver toxicity. Discussed with Dr.Gudena and states that it is very rare for letrozole to cause liver toxicity. He is suggesting that she get an ultrasound of the liver or other imaging studies. Pt states that her GI dr is planning on possibly sending her for a few imaging studies to evaluate her elevated liver enzymes.

## 2017-12-26 ENCOUNTER — Other Ambulatory Visit: Payer: Self-pay | Admitting: Gastroenterology

## 2017-12-26 ENCOUNTER — Other Ambulatory Visit: Payer: Self-pay

## 2017-12-26 ENCOUNTER — Ambulatory Visit (HOSPITAL_COMMUNITY)
Admission: RE | Admit: 2017-12-26 | Discharge: 2017-12-26 | Disposition: A | Discharge status: Home / Self Care | Payer: Medicare Other | Source: Ambulatory Visit | Attending: Gastroenterology | Admitting: Gastroenterology

## 2017-12-26 ENCOUNTER — Other Ambulatory Visit (HOSPITAL_COMMUNITY)
Admission: RE | Admit: 2017-12-26 | Discharge: 2017-12-26 | Disposition: A | Discharge status: Home / Self Care | Payer: Medicare Other | Source: Ambulatory Visit | Attending: Gastroenterology | Admitting: Gastroenterology

## 2017-12-26 ENCOUNTER — Other Ambulatory Visit: Payer: Self-pay | Admitting: *Deleted

## 2017-12-26 DIAGNOSIS — R7989 Other specified abnormal findings of blood chemistry: Secondary | ICD-10-CM | POA: Diagnosis not present

## 2017-12-26 DIAGNOSIS — R945 Abnormal results of liver function studies: Principal | ICD-10-CM

## 2017-12-26 DIAGNOSIS — K7689 Other specified diseases of liver: Secondary | ICD-10-CM | POA: Insufficient documentation

## 2017-12-26 DIAGNOSIS — K828 Other specified diseases of gallbladder: Secondary | ICD-10-CM | POA: Insufficient documentation

## 2017-12-26 LAB — HEPATIC FUNCTION PANEL
ALK PHOS: 232 U/L — AB (ref 38–126)
ALT: 1139 U/L — ABNORMAL HIGH (ref 0–44)
AST: 1372 U/L — ABNORMAL HIGH (ref 15–41)
Albumin: 3.6 g/dL (ref 3.5–5.0)
Bilirubin, Direct: 1.6 mg/dL — ABNORMAL HIGH (ref 0.0–0.2)
Indirect Bilirubin: 1.2 mg/dL — ABNORMAL HIGH (ref 0.3–0.9)
Total Bilirubin: 2.8 mg/dL — ABNORMAL HIGH (ref 0.3–1.2)
Total Protein: 6.2 g/dL — ABNORMAL LOW (ref 6.5–8.1)

## 2017-12-26 LAB — PROTIME-INR
INR: 1.05
Prothrombin Time: 13.6 seconds (ref 11.4–15.2)

## 2017-12-26 NOTE — Progress Notes (Signed)
CT reviewed and discussed with patient. We need a RUQ Ultrasound today, 12/19 or 12/20 at the latest.   [facility] Clinical pool: please arrange RUQ ultrasound Alicia: needs repeat HFP today, INR, and acute hepatitis panel. I have put orders in and just need to be released.

## 2017-12-26 NOTE — Progress Notes (Signed)
cc'ed to pcp °

## 2017-12-27 ENCOUNTER — Other Ambulatory Visit: Payer: Self-pay | Admitting: Gastroenterology

## 2017-12-27 ENCOUNTER — Other Ambulatory Visit: Payer: Self-pay

## 2017-12-27 ENCOUNTER — Other Ambulatory Visit (HOSPITAL_COMMUNITY)
Admission: RE | Admit: 2017-12-27 | Discharge: 2017-12-27 | Disposition: A | Discharge status: Home / Self Care | Payer: Medicare Other | Source: Ambulatory Visit | Attending: Gastroenterology | Admitting: Gastroenterology

## 2017-12-27 ENCOUNTER — Other Ambulatory Visit: Payer: Self-pay | Admitting: *Deleted

## 2017-12-27 DIAGNOSIS — R7989 Other specified abnormal findings of blood chemistry: Secondary | ICD-10-CM

## 2017-12-27 DIAGNOSIS — R945 Abnormal results of liver function studies: Secondary | ICD-10-CM | POA: Diagnosis not present

## 2017-12-27 LAB — HEPATIC FUNCTION PANEL
ALT: 1211 U/L — ABNORMAL HIGH (ref 0–44)
AST: 1433 U/L — ABNORMAL HIGH (ref 15–41)
Albumin: 3.6 g/dL (ref 3.5–5.0)
Alkaline Phosphatase: 256 U/L — ABNORMAL HIGH (ref 38–126)
Bilirubin, Direct: 2.2 mg/dL — ABNORMAL HIGH (ref 0.0–0.2)
Indirect Bilirubin: 1.3 mg/dL — ABNORMAL HIGH (ref 0.3–0.9)
Total Bilirubin: 3.5 mg/dL — ABNORMAL HIGH (ref 0.3–1.2)
Total Protein: 6.3 g/dL — ABNORMAL LOW (ref 6.5–8.1)

## 2017-12-27 LAB — PROTIME-INR
INR: 1.08
Prothrombin Time: 13.9 seconds (ref 11.4–15.2)

## 2017-12-27 LAB — HEPATITIS PANEL, ACUTE
HCV Ab: <0.1 s/co ratio (ref 0.0–0.9)
HEP B C IGM: NEGATIVE
Hep A IgM: NEGATIVE
Hepatitis B Surface Ag: NEGATIVE

## 2017-12-27 NOTE — Progress Notes (Signed)
Acute hepatitis panel is negative. INR is normal. She continues to have increasing LFTs, mixed pattern. We need to order stat extensive labs. Concerning for autoimmune hepatitis unless proven otherwise. She also needs a liver biopsy NEXT WEEK. I have copied her oncologist on this. Dr. Gala Romney recommending to hold all medications right now. Needs HFP, INR repeated stat on Monday, Dec 23rd. Patient is aware of plan and well informed.   1. AMA, ANA, ASMA, immunoglobulins, ceruloplasmin, iron, TIBC, ferritin, alpha 1 antitrypsin stat today. (I placed orders, need to be released for [facility]) 2. Liver biopsy NEXT WEEK. 3. Repeat HFP, INR STAT on 12/23 (please place orders) 4. Stop medications.

## 2017-12-28 LAB — ANA: Anti Nuclear Antibody(ANA): NEGATIVE

## 2017-12-28 LAB — IGG, IGA, IGM
IgA: 148 mg/dL (ref 64–422)
IgG (Immunoglobin G), Serum: 741 mg/dL (ref 700–1600)
IgM (Immunoglobulin M), Srm: 65 mg/dL (ref 26–217)

## 2017-12-28 LAB — ANTI-SMOOTH MUSCLE ANTIBODY, IGG: F-Actin IgG: 12 Units (ref 0–19)

## 2017-12-28 LAB — CERULOPLASMIN: Ceruloplasmin: 35.5 mg/dL (ref 19.0–39.0)

## 2017-12-28 LAB — MITOCHONDRIAL ANTIBODIES: Mitochondrial M2 Ab, IgG: <20 Units (ref 0.0–20.0)

## 2017-12-30 ENCOUNTER — Other Ambulatory Visit (HOSPITAL_COMMUNITY)
Admission: RE | Admit: 2017-12-30 | Discharge: 2017-12-30 | Disposition: A | Discharge status: Home / Self Care | Payer: Medicare Other | Source: Ambulatory Visit | Attending: Internal Medicine | Admitting: Internal Medicine

## 2017-12-30 DIAGNOSIS — R945 Abnormal results of liver function studies: Secondary | ICD-10-CM | POA: Diagnosis not present

## 2017-12-30 LAB — HEPATIC FUNCTION PANEL
ALK PHOS: 277 U/L — AB (ref 38–126)
ALT: 1151 U/L — ABNORMAL HIGH (ref 0–44)
AST: 1459 U/L — ABNORMAL HIGH (ref 15–41)
Albumin: 3.4 g/dL — ABNORMAL LOW (ref 3.5–5.0)
Bilirubin, Direct: 3.9 mg/dL — ABNORMAL HIGH (ref 0.0–0.2)
Indirect Bilirubin: 2.1 mg/dL — ABNORMAL HIGH (ref 0.3–0.9)
TOTAL PROTEIN: 6.4 g/dL — AB (ref 6.5–8.1)
Total Bilirubin: 6 mg/dL — ABNORMAL HIGH (ref 0.3–1.2)

## 2017-12-30 LAB — PROTIME-INR
INR: 1.08
Prothrombin Time: 13.9 seconds (ref 11.4–15.2)

## 2017-12-30 NOTE — Progress Notes (Signed)
AMA, ANA, ASMA negative. Ceruloplasmin normal. Immunoglobulins normal. INR remains stable. Alpha 1 antitrypsin remains in process but doubt will be a concern. Needs to repeat HFP, INR today (12/23) as planned. Keep plans for liver biopsy 12/26.

## 2017-12-30 NOTE — Progress Notes (Signed)
Attempted to call patient. Transaminases overall normal, with bilirubin climbing. INR remains stable. Liver biopsy upcoming.

## 2017-12-31 ENCOUNTER — Other Ambulatory Visit: Payer: Self-pay

## 2017-12-31 ENCOUNTER — Other Ambulatory Visit: Payer: Self-pay | Admitting: Radiology

## 2017-12-31 DIAGNOSIS — R7989 Other specified abnormal findings of blood chemistry: Secondary | ICD-10-CM

## 2017-12-31 DIAGNOSIS — R945 Abnormal results of liver function studies: Principal | ICD-10-CM

## 2017-12-31 LAB — MITOCHONDRIAL ANTIBODIES: Mitochondrial M2 Ab, IgG: <20 Units (ref 0.0–20.0)

## 2017-12-31 LAB — IGG, IGA, IGM
IgA: 157 mg/dL (ref 64–422)
IgG (Immunoglobin G), Serum: 715 mg/dL (ref 700–1600)
IgM (Immunoglobulin M), Srm: 65 mg/dL (ref 26–217)

## 2017-12-31 LAB — ALPHA-1 ANTITRYPSIN PHENOTYPE: A-1 Antitrypsin, Ser: 177 mg/dL (ref 101–187)

## 2017-12-31 LAB — ANA: Anti Nuclear Antibody(ANA): NEGATIVE

## 2017-12-31 LAB — ANTI-SMOOTH MUSCLE ANTIBODY, IGG: F-Actin IgG: 13 Units (ref 0–19)

## 2017-12-31 LAB — CERULOPLASMIN: Ceruloplasmin: 35.7 mg/dL (ref 19.0–39.0)

## 2017-12-31 NOTE — Progress Notes (Signed)
PT is aware.

## 2017-12-31 NOTE — Progress Notes (Signed)
Pt is aware to have her HFP done ( I am faxing to the hospital) on Friday, if they do not check it on Thursday.

## 2017-12-31 NOTE — Progress Notes (Signed)
Can we please have her repeat HFP and INR on Friday (if they don't do on Thursday at time of biopsy). I am copying this to Randall Hiss to watch for this Friday. Copying other staff as this is a holiday, so that it can be followed.

## 2018-01-02 ENCOUNTER — Ambulatory Visit (HOSPITAL_COMMUNITY)
Admission: RE | Admit: 2018-01-02 | Discharge: 2018-01-02 | Disposition: A | Discharge status: Home / Self Care | Payer: Medicare Other | Source: Ambulatory Visit | Attending: Gastroenterology | Admitting: Gastroenterology

## 2018-01-02 ENCOUNTER — Other Ambulatory Visit: Payer: Self-pay

## 2018-01-02 ENCOUNTER — Encounter (HOSPITAL_COMMUNITY): Payer: Self-pay

## 2018-01-02 DIAGNOSIS — Z880 Allergy status to penicillin: Secondary | ICD-10-CM | POA: Diagnosis not present

## 2018-01-02 DIAGNOSIS — R7989 Other specified abnormal findings of blood chemistry: Secondary | ICD-10-CM | POA: Diagnosis not present

## 2018-01-02 DIAGNOSIS — Z888 Allergy status to other drugs, medicaments and biological substances status: Secondary | ICD-10-CM | POA: Insufficient documentation

## 2018-01-02 DIAGNOSIS — K219 Gastro-esophageal reflux disease without esophagitis: Secondary | ICD-10-CM | POA: Diagnosis not present

## 2018-01-02 DIAGNOSIS — Z79890 Hormone replacement therapy (postmenopausal): Secondary | ICD-10-CM | POA: Insufficient documentation

## 2018-01-02 DIAGNOSIS — Z886 Allergy status to analgesic agent status: Secondary | ICD-10-CM | POA: Diagnosis not present

## 2018-01-02 DIAGNOSIS — R945 Abnormal results of liver function studies: Secondary | ICD-10-CM

## 2018-01-02 DIAGNOSIS — C50919 Malignant neoplasm of unspecified site of unspecified female breast: Secondary | ICD-10-CM | POA: Insufficient documentation

## 2018-01-02 DIAGNOSIS — E785 Hyperlipidemia, unspecified: Secondary | ICD-10-CM | POA: Insufficient documentation

## 2018-01-02 DIAGNOSIS — B179 Acute viral hepatitis, unspecified: Secondary | ICD-10-CM | POA: Insufficient documentation

## 2018-01-02 DIAGNOSIS — K831 Obstruction of bile duct: Secondary | ICD-10-CM | POA: Insufficient documentation

## 2018-01-02 DIAGNOSIS — K7290 Hepatic failure, unspecified without coma: Secondary | ICD-10-CM | POA: Diagnosis not present

## 2018-01-02 DIAGNOSIS — Z79899 Other long term (current) drug therapy: Secondary | ICD-10-CM | POA: Diagnosis not present

## 2018-01-02 DIAGNOSIS — Z87891 Personal history of nicotine dependence: Secondary | ICD-10-CM | POA: Diagnosis not present

## 2018-01-02 DIAGNOSIS — J45909 Unspecified asthma, uncomplicated: Secondary | ICD-10-CM | POA: Diagnosis not present

## 2018-01-02 DIAGNOSIS — E039 Hypothyroidism, unspecified: Secondary | ICD-10-CM | POA: Diagnosis not present

## 2018-01-02 LAB — CBC
HCT: 47.3 % — ABNORMAL HIGH (ref 36.0–46.0)
Hemoglobin: 15.8 g/dL — ABNORMAL HIGH (ref 12.0–15.0)
MCH: 32 pg (ref 26.0–34.0)
MCHC: 33.4 g/dL (ref 30.0–36.0)
MCV: 95.9 fL (ref 80.0–100.0)
Platelets: 224 10*3/uL (ref 150–400)
RBC: 4.93 MIL/uL (ref 3.87–5.11)
RDW: 15.7 % — ABNORMAL HIGH (ref 11.5–15.5)
WBC: 6.8 10*3/uL (ref 4.0–10.5)
nRBC: 0 % (ref 0.0–0.2)

## 2018-01-02 LAB — PROTIME-INR
INR: 1.07
PROTHROMBIN TIME: 13.8 s (ref 11.4–15.2)

## 2018-01-02 MED ORDER — GELATIN ABSORBABLE 12-7 MM EX MISC
CUTANEOUS | Status: AC
  Filled 2018-01-02: qty 1

## 2018-01-02 MED ORDER — LIDOCAINE HCL (PF) 1 % IJ SOLN
INTRAMUSCULAR | Status: AC
  Filled 2018-01-02: qty 30

## 2018-01-02 MED ORDER — SODIUM CHLORIDE 0.9 % IV SOLN: INTRAVENOUS | Status: DC

## 2018-01-02 MED ORDER — FENTANYL CITRATE (PF) 100 MCG/2ML IJ SOLN
INTRAMUSCULAR | Status: AC
  Filled 2018-01-02: qty 2

## 2018-01-02 MED ORDER — FENTANYL CITRATE (PF) 100 MCG/2ML IJ SOLN
INTRAMUSCULAR | Status: AC | PRN
  Administered 2018-01-02: 50 ug via INTRAVENOUS
  Administered 2018-01-02: 25 ug via INTRAVENOUS

## 2018-01-02 MED ORDER — MIDAZOLAM HCL 2 MG/2ML IJ SOLN
INTRAMUSCULAR | Status: AC
  Filled 2018-01-02: qty 2

## 2018-01-02 MED ORDER — MIDAZOLAM HCL 2 MG/2ML IJ SOLN
INTRAMUSCULAR | Status: AC | PRN
  Administered 2018-01-02: 1 mg via INTRAVENOUS

## 2018-01-02 NOTE — Procedures (Signed)
Random Liver Bx 18 g times three EBL 0 Comp 0

## 2018-01-02 NOTE — H&P (Signed)
Chief Complaint: Patient was seen in consultation today for liver core biopsy at the request of Annitta Needs  Referring Physician(s): Annitta Needs  Supervising Physician: Marybelle Killings  Patient Status: [facility] - Out-pt  History of Present Illness: Cheryl Perry is a 72 y.o. female   Hx Breast Ca 07/2017 Noted rise in LFTS after radiation treatment Has been followed by [facility]  Persistent high levels  [place]: IMPRESSION: No cholelithiasis is noted, but moderate gallbladder wall thickening is noted. If there is clinical concern for cholecystitis, HIDA scan may be performed for further evaluation. Multiple hepatic cysts are noted.  No biliary dilatation is noted.  Now scheduled for liver core bx    Past Medical History:  Diagnosis Date  . Allergy   . Arthritis    back painherniated disc lumbar  . Asthma   . Back pain   . Cancer ([place]) 07/2017   right breast cancer, lumpectomy and 20 sessions of XRT  . Complication of anesthesia   . GERD (gastroesophageal reflux disease)   . Hiatal hernia   . Hyperlipidemia   . Hypothyroidism   . Osteopenia   . PONV (postoperative nausea and vomiting)    asks for scop patch  . Thyroid disease   . Vasculopathy    lymphatic    Past Surgical History:  Procedure Laterality Date  . BREAST LUMPECTOMY WITH RADIOACTIVE SEED AND SENTINEL LYMPH NODE BIOPSY Right 08/22/2017   Procedure: BREAST LUMPECTOMY WITH RADIOACTIVE SEED AND SENTINEL LYMPH NODE BIOPSY;  Surgeon: Erroll Luna, MD;  Location: [facility];  Service: General;  Laterality: Right;  . COLONOSCOPY  2015   Dr. Earlean Shawl: Hemorrhoids, diverticulosis.  Next colonoscopy in 5 years due to previous history of adenomatous colon polyps  . CYSTOSCOPY    . cystscopy    . ESOPHAGOGASTRODUODENOSCOPY N/A 01/28/2015   Dr.Rourk- 3 small prepyloric/gastric ulcers- likely culprits causing bleeding. hiatal hernia, small gastic polyps not manipulated.  .  ESOPHAGOGASTRODUODENOSCOPY N/A 05/26/2015   Dr. Gala Romney: Small hiatal hernia, previous gastric ulcers completely healed.  Marland Kitchen KNEE SURGERY    . TONSILLECTOMY    . TUBAL LIGATION      Allergies: Naproxen; Lipitor [atorvastatin]; Premarin [conjugated estrogens]; Ampicillin; Avelox [moxifloxacin hcl in nacl]; Bactrim [sulfamethoxazole-trimethoprim]; Levaquin [levofloxacin in d5w]; Livalo [pitavastatin]; Macrodantin [nitrofurantoin macrocrystal]; and Trental [pentoxifylline er]  Medications: Prior to Admission medications   Medication Sig Start Date End Date Taking? Authorizing Provider  albuterol (PROVENTIL HFA) 108 (90 BASE) MCG/ACT inhaler Inhale 2 puffs into the lungs every 6 (six) hours as needed for wheezing. May use only 3 times a month   Yes [provider]  B Complex Vitamins (VITAMIN B COMPLEX PO) Take 1 tablet by mouth daily.   Yes [provider]  Calcium Carbonate (CALCIUM 600 PO) Take 1,200 mg by mouth daily.    Yes [provider]  Cholecalciferol (VITAMIN D) 2000 UNITS CAPS Take 2,000 Units by mouth daily.    Yes [provider]  EPINEPHrine 0.3 mg/0.3 mL IJ SOAJ injection Inject 0.3 mg into the muscle once.  04/02/17  Yes [provider]  fluticasone-salmeterol (ADVAIR HFA) 45-21 MCG/ACT inhaler Inhale 2 puffs into the lungs 2 (two) times daily. 12/14/14  Yes Hawks, Christy A, FNP  letrozole (FEMARA) 2.5 MG tablet Take 1 tablet (2.5 mg total) by mouth daily. 10/22/17  Yes Nicholas Lose, MD  levothyroxine (SYNTHROID, LEVOTHROID) 75 MCG tablet TAKE 1 TABLET (75 MCG TOTAL)  BY MOUTH DAILY BEFORE BREAKFAST. Patient  taking differently: Take 75 mcg by mouth daily before breakfast. TAKE 1 TABLET (75 MCG TOTAL)  BY MOUTH DAILY BEFORE BREAKFAST. 12/14/16  Yes Hawks, Christy A, FNP  loratadine (CLARITIN) 10 MG tablet Take 10 mg by mouth daily.   Yes [provider]  methocarbamol (ROBAXIN) 750 MG tablet Take 375 mg by mouth every 6 (six) hours  as needed for muscle spasms.  09/27/17  Yes [provider]  montelukast (SINGULAIR) 10 MG tablet Take 10 mg by mouth at bedtime.   Yes [provider]  pantoprazole (PROTONIX) 40 MG tablet Take 1 tablet (40 mg total) by mouth daily before breakfast. 06/12/17  Yes Mahala Menghini, PA-C  Probiotic Product (PROBIOTIC ADVANCED PO) Take 1 capsule by mouth daily.    Yes [provider]  rosuvastatin (CRESTOR) 5 MG tablet TAKE 1 TABLET EVERY WEEK TO BEGIN Patient taking differently: Take 5 mg by mouth every 7 (seven) days. TAKE 1 TABLET EVERY WEEK TO BEGIN 07/09/17  Yes Branch, Alphonse Guild, MD     Family History  Problem Relation Age of Onset  . Stroke Mother   . Hypertension Mother   . Alzheimer's disease Mother   . Heart disease Sister   . Hypertension Sister   . Heart attack Sister        Psychologist, forensic   . Arthritis Daughter        very bad MVA   . Hypertension Son   . Seizures Son   . Colon cancer Neg Hx   . Liver cancer Neg Hx   . Pancreatic disease Neg Hx     Social History   Socioeconomic History  . Marital status: Married    Spouse name: Not on file  . Number of children: Not on file  . Years of education: Not on file  . Highest education level: Not on file  Occupational History  . Occupation: retired     Comment: HR work  / Astronomer   Social Needs  . Financial resource strain: Not on file  . Food insecurity:    Worry: Not on file    Inability: Not on file  . Transportation needs:    Medical: Not on file    Non-medical: Not on file  Tobacco Use  . Smoking status: Former Smoker    Last attempt to quit: 06/06/1974    Years since quitting: 43.6  . Smokeless tobacco: Never Used  . Tobacco comment: Quit x 40 years ago  Substance and Sexual Activity  . Alcohol use: No    Alcohol/week: 0.0 standard drinks  . Drug use: No  . Sexual activity: Not on file  Lifestyle  . Physical activity:    Days per week: Not on file    Minutes  per session: Not on file  . Stress: Not on file  Relationships  . Social connections:    Talks on phone: Not on file    Gets together: Not on file    Attends religious service: Not on file    Active member of club or organization: Not on file    Attends meetings of clubs or organizations: Not on file    Relationship status: Not on file  Other Topics Concern  . Not on file  Social History Narrative  . Not on file     Review of Systems: A 12 point ROS discussed and pertinent positives are indicated in the HPI above.  All other systems are negative.  Review of  Systems  Constitutional: Positive for fatigue. Negative for activity change and fever.  Respiratory: Negative for shortness of breath.   Cardiovascular: Negative for chest pain.  Gastrointestinal: Positive for nausea.  Neurological: Positive for weakness.  Psychiatric/Behavioral: Negative for behavioral problems and confusion.    Vital Signs: BP 137/89   Pulse 80   Temp 97.9 F (36.6 C) (Tympanic)   Ht 5\' 6"  (1.676 m)   Wt 163 lb (73.9 kg)   SpO2 100%   BMI 26.31 kg/m   Physical Exam Vitals signs reviewed.  Cardiovascular:     Rate and Rhythm: Normal rate and regular rhythm.  Pulmonary:     Effort: Pulmonary effort is normal.     Breath sounds: Normal breath sounds.  Abdominal:     General: Bowel sounds are normal.     Palpations: Abdomen is soft.  Musculoskeletal: Normal range of motion.  Skin:    General: Skin is warm and dry.  Neurological:     General: No focal deficit present.     Mental Status: She is oriented to person, place, and time.  Psychiatric:        Mood and Affect: Mood normal.        Behavior: Behavior normal.        Thought Content: Thought content normal.        Judgment: Judgment normal.     Imaging: Ct Abdomen Pelvis W Contrast  Result Date: 12/25/2017 CLINICAL DATA:  Elevated liver function tests. Markedly elevated transaminases. History of breast cancer. EXAM: CT ABDOMEN AND  PELVIS WITH CONTRAST TECHNIQUE: Multidetector CT imaging of the abdomen and pelvis was performed using the standard protocol following bolus administration of intravenous contrast. CONTRAST:  163mL ISOVUE-300 IOPAMIDOL (ISOVUE-300) INJECTION 61% COMPARISON:  None. FINDINGS: Lower chest: No pleural effusions. Small punctate nodular densities at the right lung base, largest measuring 4 mm. Hepatobiliary: Several hypodensities scattered throughout the liver are most compatible with hepatic cysts. Largest hepatic cyst is near the anterior dome measuring up to 1.7 cm. No suspicious liver lesions. The gallbladder is decompressed but there appears to be diffuse wall thickening. Pancreas: Unremarkable. No pancreatic ductal dilatation or surrounding inflammatory changes. Spleen: Normal in size without focal abnormality. Adrenals/Urinary Tract: Normal adrenal glands. There are at least 2 right renal cysts, largest measuring 3.7 cm. No suspicious renal lesions and no hydronephrosis. Mild to moderate distention of the urinary bladder. Stomach/Bowel: Stomach is within normal limits. Appendix appears normal. No evidence of bowel wall thickening, distention, or inflammatory changes. Vascular/Lymphatic: Atherosclerotic calcifications in the aorta without aneurysm. Mixing of contrast in the venous structures at the groin and iliac vessels. Mild lymphadenopathy in the upper abdomen. Precaval lymph node measures 1.4 cm on sequence 2 image 29. Mild stranding in the porta hepatis region. Slightly prominent periaortic lymph nodes. Lymph node just anterior to the aorta on sequence 2, image 39 measures 0.9 cm in short axis. Reproductive: Evidence for a pessary device in the pelvis. Uterus and adnexal structures are unremarkable. Other: Negative for free fluid.  Negative for free air. Musculoskeletal: Scoliosis in the lumbar spine towards the right side. IMPRESSION: 1. Wall thickening in the gallbladder. However, the gallbladder is not  distended. Findings are nonspecific on CT and consider further characterization with a right upper quadrant ultrasound. No significant biliary dilatation. 2. Question mild edema in the porta hepatis region. Slightly prominent lymph nodes in the upper abdomen are nonspecific and could be reactive. 3. Hepatic and renal cysts. 4. **An incidental finding of  potential clinical significance has been found. Small punctate nodules at the right lung base are nonspecific. If patient is at risk for metastatic disease based on the history of breast cancer, recommend further characterization with a dedicated chest CT. ** These results will be called to the ordering clinician or representative by the Radiology Department at the imaging location. Electronically Signed   By: Markus Daft M.D.   On: 12/25/2017 19:00   Dg Wrfm Dexa  Result Date: 12/18/2017 EXAM: DUAL X-RAY ABSORPTIOMETRY (DXA) FOR BONE MINERAL DENSITY IMPRESSION: Your patient SHYNA DUIGNAN completed a FRAX assessment on 12/18/2017 using the [place] (analysis version: 17 SP 1) manufactured by [employer]. The following summarizes the results of our evaluation. PATIENT BIOGRAPHICAL: Name: Dhani, Imel Patient ID: [id number] Birth Date: [birth year] Height:    66.0 in. Gender:     Female    [age]       Weight:    167.6 lbs. Ethnicity:  White                            Exam Date: 12/18/2017 FRAX* RESULTS:  (version: 3.8) 10-year Probability of Fracture1 Major Osteoporotic Fracture2 Hip Fracture 11.8%                        2.4% Population:                  [place] (Caucasian) Risk Factors:                None Based on DualFemur (Right) Neck BMD 1 -The 10-year probability of fracture may be lower than reported if the patient has received treatment. 2 -Major Osteoporotic Fracture: Clinical Spine, Forearm, Hip or Shoulder *FRAX is a Materials engineer of the [employer] for Metabolic Bone Disease, a [place] (WHO) [facility]. ASSESSMENT: The probability of a major osteoporotic fracture is 11.8% within the next ten years The probability of a hip fracture is 2.4% within the next ten years [facility]  Radiology Your patient Adileny Delon completed a BMD test on 12/18/2017 using the [place] (software version: 17 SP 1) manufactured by [employer]. The following summarizes the results of our evaluation.Tech:CC PATIENT BIOGRAPHICAL: Name: Maycel, Riffe Patient ID: [id number] Birth Date: [birth year] Height: 66.0 in. Gender: Female Exam Date: 12/18/2017 Weight: 167.6 lbs. Indications: Advanced Age, Caucasian, HISTORY OF CANCER, Hypothyroid, Post Menopausal Fractures: Treatments: Calcium, THYROID MEDS, Vitamin D DENSITOMETRY RESULTS: Site Region Meas'd Date Meas'd Age WHO Class. YA T-score BMD %Chg Prev. Sig. Chg (*) DualFemur Neck Right 12/18/2017 72.4 years Osteopenia -1.8 0.781 g/cm2 0.0% DualFemur Neck Right 12/15/2015 70.4 years Osteopenia -1.8 0.781 g/cm2 1.7% DualFemur Neck Right 09/02/2013 68.1 years Osteopenia -1.9 0.768 g/cm2 -13.3% * DualFemur Neck Right 06/27/2011 65.9 years Osteopenia -1.1 0.886 g/cm2 5.7% * DualFemur Neck Right 09/10/2007 62.1 years Osteopenia -1.4 0.838 g/cm2 - DualFemur Total Mean 12/18/2017 72.4 years Osteopenia -1.3 0.842 g/cm2 -1.2% DualFemur Total Mean 12/15/2015 70.4 years Osteopenia -1.2 0.852 g/cm2 -0.7% DualFemur Total Mean 09/02/2013 68.1 years Osteopenia -1.2 0.858 g/cm2 4.5% * DualFemur Total Mean 06/27/2011 65.9 years Osteopenia -1.5 0.821 g/cm2 -9.9% * DualFemur Total Mean 09/10/2007 62.1 years Normal -0.8 0.911 g/cm2 - Left Forearm Radius 33% 12/18/2017 72.4 years Normal -0.9 0.805 g/cm2 -3.5% Left Forearm Radius 33% 12/15/2015 70.4 years  Normal -0.6 0.834 g/cm2 - - ASSESSMENT: The BMD measured at Femur Neck Right is 0.781 g/cm2 with a T-score of -1.8. This patient is considered osteopenic according to [facility]) criteria. Compared with the prior study on 12/15/2015 ,the BMD of the femoral neck shows no statistically significant change. Lumbar spine excluded due to degenerative changes. Scan quality is good. - World Health Organization [facility]) criteria for post-menopausal, Caucasian Women: Normal:       T-score at or above -1 SD Osteopenia:   T-score between -1 and -2.5 SD Osteoporosis: T-score at or below -2.5 SD - RECOMMENDATIONS: 1. All patients should optimize calcium and vitamin D intake. 2. Consider FDA-approved medical therapies in postmenopausal women and men aged 34 years and older, based on the following: a. Hip or vertebral (clinical or morphometric ) fracture. b. T-score < -2.5 at the femoral neck or spine after appropriate evaluation to exclude secondary causes c.Low bone mass (T-score between -1.0 and -2.5 at the femoral neck or spine) and a 10 -year probability of a hip fracture > 3% or a 10- year probability of a major osteoporosis- related fracture > 20% based on the [place]-adapted WHO algorithm d. Clinician judgment and/or patient preferences may inidcate treatment for people with 10 -year fracture probabilities above or below these levels. - FOLLOW-UP: People with diagnosed cases of osteoporosis or at high risk for fracture should have regular bone mineral density tests. For patients eligible for Medicare, routine testing is allowed once every 2 years. The testing frequency can be increased to one year for patients who have rapidly progressing disease, those who are receiving or discontinuing medical therapy to restore bone mass, or have additional risk factors. I have reviewed this report, and agree with the above findings. [facility] - FRAX* RESULTS:  (version: 3.8) 10-year Probability of Fracture1 Major Osteoporotic Fracture2 Hip Fracture 11.8%                        2.4% Population:                  [place] (Caucasian) Risk Factors:                None Based on DualFemur  (Right) Neck BMD 1 -The 10-year probability of fracture may be lower than reported if the patient has received treatment. 2 -Major Osteoporotic Fracture: Clinical Spine, Forearm, Hip or Shoulder *FRAX is a Materials engineer of the [employer] for Metabolic Bone Disease, a [place] (WHO) [facility]. I have reviewed this report, and agree with the above findings. [facility] Radiology Electronically Signed   By: Lowella Grip III M.D.   On: 12/18/2017 09:58   US Abdomen Limited Ruq  Result Date: 12/26/2017 CLINICAL DATA:  Elevated liver function tests. EXAM: ULTRASOUND ABDOMEN LIMITED RIGHT UPPER QUADRANT COMPARISON:  CT scan of December 25, 2017. FINDINGS: Gallbladder: No gallstones are noted. Moderate gallbladder wall thickening is noted measured at approximately 6 mm. No pericholecystic fluid or sonographic Murphy's sign is noted. Common bile duct: Diameter: 3 mm which is within normal limits. Liver: Multiple hepatic cysts are noted with the largest measuring 2.1 cm. Within normal limits in parenchymal echogenicity. Portal vein is patent on color Doppler imaging with normal direction of blood flow towards the liver. IMPRESSION: No cholelithiasis is noted, but moderate gallbladder wall thickening is noted. If there is clinical concern for cholecystitis, HIDA scan may be performed for further evaluation. Multiple  hepatic cysts are noted.  No biliary dilatation is noted. Electronically Signed   By: Marijo Conception, M.D.   On: 12/26/2017 13:06    Labs:  CBC: Recent Labs    06/17/17 1040 07/17/17 0808 12/25/17 1447  WBC 6.6 5.8 5.5  HGB 14.5 14.6 14.8  HCT 43.2 44.7 45.4  PLT 237 205 153    COAGS: Recent Labs    12/26/17 1230 12/27/17 1109 12/30/17 1140  INR 1.05 1.08 1.08    BMP: Recent Labs    06/17/17 1040 07/17/17 0808 12/17/17 0918  NA 142 141 143  K 4.7 4.1 4.3  CL 101 106 107*  CO2 22 27 20   GLUCOSE 34* 94 87  BUN  12 13 11   CALCIUM 9.3 9.4 9.6  CREATININE 0.76 0.85 0.79  GFRNONAA 79 >60 75  GFRAA 91 >60 86    LIVER FUNCTION TESTS: Recent Labs    12/23/17 1204 12/26/17 1230 12/27/17 1109 12/30/17 1140  BILITOT 1.4* 2.8* 3.5* 6.0*  AST 1,077* 1,372* 1,433* 1,459*  ALT 861* 1,139* 1,211* 1,151*  ALKPHOS 242* 232* 256* 277*  PROT 5.8* 6.2* 6.3* 6.4*  ALBUMIN 3.9 3.6 3.6 3.4*    TUMOR MARKERS: No results for input(s): AFPTM, CEA, CA199, CHROMGRNA in the last 8760 hours.  Assessment and Plan:  Persistent elevated LFTs Scheduled for liver core biopsy Risks and benefits discussed with the patient including, but not limited to bleeding, infection, damage to adjacent structures or low yield requiring additional tests.  All of the patient's questions were answered, patient is agreeable to proceed. Consent signed and in chart.    Thank you for this interesting consult.  I greatly enjoyed meeting Cheryl Perry and look forward to participating in their care.  A copy of this report was sent to the requesting provider on this date.  Electronically Signed: Lavonia Drafts, PA-C 01/02/2018, 11:52 AM   I spent a total of  30 Minutes   in face to face in clinical consultation, greater than 50% of which was counseling/coordinating care for liver core bx

## 2018-01-02 NOTE — Discharge Instructions (Addendum)
Liver Biopsy, Care After °These instructions give you information about how to care for yourself after your procedure. Your health care provider may also give you more specific instructions. If you have problems or questions, contact your health care provider. °What can I expect after the procedure? °After your procedure, it is common to have: °· Pain and soreness in the area where the biopsy was done. °· Bruising around the area where the biopsy was done. °· Sleepiness and fatigue for 1-2 days. °Follow these instructions at home: °Medicines °· Take over-the-counter and prescription medicines only as told by your health care provider. °· If you were prescribed an antibiotic medicine, take it as told by your health care provider. Do not stop taking the antibiotic even if you start to feel better. °· Do not take medicines such as aspirin and ibuprofen unless your health care provider tells you to take them. These medicines thin your blood and can increase the risk of bleeding. °· If you are taking prescription pain medicine, take actions to prevent or treat constipation. Your health care provider may recommend that you: °? Drink enough fluid to keep your urine pale yellow. °? Eat foods that are high in fiber, such as fresh fruits and vegetables, whole grains, and beans. °? Limit foods that are high in fat and processed sugars, such as fried or sweet foods. °? Take an over-the-counter or prescription medicine for constipation. °Incision care °· Follow instructions from your health care provider about how to take care of your incision. Make sure you: °? Wash your hands with soap and water before you change your bandage (dressing). If soap and water are not available, use hand sanitizer. °? Change your dressing as told by your health care provider. °? Leave stitches (sutures), skin glue, or adhesive strips in place. These skin closures may need to stay in place for 2 weeks or longer. If adhesive strip edges start to  loosen and curl up, you may trim the loose edges. Do not remove adhesive strips completely unless your health care provider tells you to do that. °· Check your incision area every day for signs of infection. Check for: °? Redness, swelling, or pain. °? Fluid or blood. °? Warmth. °? Pus or a bad smell. °· Do not take baths, swim, or use a hot tub until your health care provider says it is okay to do so. °Activity ° °· Rest at home for 1-2 days, or as directed by your health care provider. °? Avoid sitting for a long time without moving. Get up to take short walks every 1-2 hours. This is important to improve blood flow and breathing. Ask for help if you feel weak or unsteady. °· Return to your normal activities as told by your health care provider. Ask your health care provider what activities are safe for you. °· Do not drive or use heavy machinery while taking prescription pain medicine. °· Do not lift anything that is heavier than 10 lb (4.5 kg), or the limit that your health care provider tells you, until he or she says that it is safe. °· Do not play contact sports for 2 weeks after the procedure. °General instructions ° °· Do not drink alcohol in the first week after the procedure. °· Have someone stay with you for at least 24 hours after the procedure. °· It is your responsibility to obtain your test results. Ask your health care provider, or the department that is doing the test: °? When will my   results be ready? °? How will I get my results? °? What are my treatment options? °? What other tests do I need? °? What are my next steps? °· Keep all follow-up visits as told by your health care provider. This is important. °Contact a health care provider if: °· You have increased bleeding from an incision, resulting in more than a small spot of blood. °· You have redness, swelling, or increasing pain in any incisions. °· You notice a discharge or a bad smell coming from any of your incisions. °· You have a fever or  chills. °Get help right away if: °· You develop swelling, bloating, or pain in your abdomen. °· You become dizzy or faint. °· You develop a rash. °· You have nausea or you vomit. °· You faint, or you have shortness of breath or difficulty breathing. °· You develop chest pain. °· You have problems with your speech or vision. °· You have trouble with your balance or moving your arms or legs. °Summary °· After the liver biopsy, it is common to have pain, soreness, and bruising in the area, as well as sleepiness and fatigue. °· Take over-the-counter and prescription medicines only as told by your health care provider. °· Follow instructions from your health care provider about how to care for your incision. Check the incision area daily for signs of infection. °This information is not intended to replace advice given to you by your health care provider. Make sure you discuss any questions you have with your health care provider. °Document Released: 07/14/2004 Document Revised: 01/04/2017 Document Reviewed: 01/04/2017 °Elsevier Interactive Patient Education © 2019 [employer] °Moderate Conscious Sedation, Adult, Care After °These instructions provide you with information about caring for yourself after your procedure. Your health care provider may also give you more specific instructions. Your treatment has been planned according to current medical practices, but problems sometimes occur. Call your health care provider if you have any problems or questions after your procedure. °What can I expect after the procedure? °After your procedure, it is common: °· To feel sleepy for several hours. °· To feel clumsy and have poor balance for several hours. °· To have poor judgment for several hours. °· To vomit if you eat too soon. °Follow these instructions at home: °For at least 24 hours after the procedure: ° °· Do not: °? Participate in activities where you could fall or become injured. °? Drive. °? Use heavy  machinery. °? Drink alcohol. °? Take sleeping pills or medicines that cause drowsiness. °? Make important decisions or sign legal documents. °? Take care of children on your own. °· Rest. °Eating and drinking °· Follow the diet recommended by your health care provider. °· If you vomit: °? Drink water, juice, or soup when you can drink without vomiting. °? Make sure you have little or no nausea before eating solid foods. °General instructions °· Have a responsible adult stay with you until you are awake and alert. °· Take over-the-counter and prescription medicines only as told by your health care provider. °· If you smoke, do not smoke without supervision. °· Keep all follow-up visits as told by your health care provider. This is important. °Contact a health care provider if: °· You keep feeling nauseous or you keep vomiting. °· You feel light-headed. °· You develop a rash. °· You have a fever. °Get help right away if: °· You have trouble breathing. °This information is not intended to replace advice given to you by your   health care provider. Make sure you discuss any questions you have with your health care provider. °Document Released: 10/15/2012 Document Revised: 05/30/2015 Document Reviewed: 04/16/2015 °Elsevier Interactive Patient Education © 2019 [employer] ° °

## 2018-01-03 ENCOUNTER — Telehealth: Payer: Self-pay | Admitting: Gastroenterology

## 2018-01-03 ENCOUNTER — Other Ambulatory Visit: Payer: Self-pay

## 2018-01-03 ENCOUNTER — Telehealth: Payer: Self-pay

## 2018-01-03 ENCOUNTER — Other Ambulatory Visit: Payer: Medicare Other

## 2018-01-03 DIAGNOSIS — R7989 Other specified abnormal findings of blood chemistry: Secondary | ICD-10-CM

## 2018-01-03 DIAGNOSIS — R945 Abnormal results of liver function studies: Principal | ICD-10-CM

## 2018-01-03 LAB — HEPATIC FUNCTION PANEL
ALT: 928 IU/L (ref 0–32)
AST: 1245 IU/L (ref 0–40)
Albumin: 3.5 g/dL (ref 3.5–4.8)
Alkaline Phosphatase: 355 IU/L — ABNORMAL HIGH (ref 39–117)
Bilirubin Total: 9.9 mg/dL — ABNORMAL HIGH (ref 0.0–1.2)
Bilirubin, Direct: 8.53 mg/dL — ABNORMAL HIGH (ref 0.00–0.40)
Total Protein: 5.3 g/dL — ABNORMAL LOW (ref 6.0–8.5)

## 2018-01-03 NOTE — Telephone Encounter (Signed)
Noted. Awaiting lab results, message sent to AB. Will continue to look for these.  Dark/brown urine likely bilirubinuria (bilirubin in her urine) with a total bili at 6.0. Will cc AB on this message. Typically this can be confirmed with a UA, although darkened urine is not wholely unexpected. We can consider UA at the start of the week pending progress and other lab results.

## 2018-01-03 NOTE — Telephone Encounter (Signed)
Labs drawn for pt prior to her liver BX were CBC and PT/INR. Hfp wasn't drawn 01/02/18. Spoke with pt and she didn't feel like she could drive to [facility] due to the restrictions from [facility]. Pt asked if she could go to [place]. Lab orders were faxed and they are expecting pt.   PT is concerned about her urine still being discolored and isn't sure who she should see about this. It's not the color of coffee grains but pt feels that is it still brown.

## 2018-01-03 NOTE — Telephone Encounter (Signed)
EXTREME FATIGUE. MILD RUQ ABDOMINAL PAIN. IN PAST: DOXYCYCILINE OCT/NOV 2019 THEN Z PAK/STEROIDS. PT AWARE OF ELEVATED LIVER ENZYMES(MIXED PATTERN, GETTING WORSE). EXPLAINED SHE SHOULD SEE HEPATOLOGY/LIVER TRANSPLANT FOR AN EVALUATION([facility]). LIVER Bx PENDING(DEC 26). LAST DOSE: CHEMO OCT 2019. WILL THINK ABOUT IT AND MAY WAIT UNTIL THE AM TO GO AT THE EARLIEST OR TUES FOR LIVER BIOPSY RESULTS.

## 2018-01-04 DIAGNOSIS — Z881 Allergy status to other antibiotic agents status: Secondary | ICD-10-CM | POA: Diagnosis not present

## 2018-01-04 DIAGNOSIS — Z880 Allergy status to penicillin: Secondary | ICD-10-CM | POA: Diagnosis not present

## 2018-01-04 DIAGNOSIS — T39395A Adverse effect of other nonsteroidal anti-inflammatory drugs [NSAID], initial encounter: Secondary | ICD-10-CM | POA: Diagnosis present

## 2018-01-04 DIAGNOSIS — J90 Pleural effusion, not elsewhere classified: Secondary | ICD-10-CM | POA: Diagnosis not present

## 2018-01-04 DIAGNOSIS — R911 Solitary pulmonary nodule: Secondary | ICD-10-CM | POA: Diagnosis present

## 2018-01-04 DIAGNOSIS — E785 Hyperlipidemia, unspecified: Secondary | ICD-10-CM | POA: Diagnosis not present

## 2018-01-04 DIAGNOSIS — Z7952 Long term (current) use of systemic steroids: Secondary | ICD-10-CM | POA: Diagnosis not present

## 2018-01-04 DIAGNOSIS — Z888 Allergy status to other drugs, medicaments and biological substances status: Secondary | ICD-10-CM | POA: Diagnosis not present

## 2018-01-04 DIAGNOSIS — R21 Rash and other nonspecific skin eruption: Secondary | ICD-10-CM | POA: Diagnosis not present

## 2018-01-04 DIAGNOSIS — R740 Nonspecific elevation of levels of transaminase and lactic acid dehydrogenase [LDH]: Secondary | ICD-10-CM | POA: Diagnosis not present

## 2018-01-04 DIAGNOSIS — R05 Cough: Secondary | ICD-10-CM | POA: Diagnosis not present

## 2018-01-04 DIAGNOSIS — E039 Hypothyroidism, unspecified: Secondary | ICD-10-CM | POA: Diagnosis not present

## 2018-01-04 DIAGNOSIS — R233 Spontaneous ecchymoses: Secondary | ICD-10-CM | POA: Diagnosis present

## 2018-01-04 DIAGNOSIS — R5383 Other fatigue: Secondary | ICD-10-CM | POA: Diagnosis not present

## 2018-01-04 DIAGNOSIS — Z853 Personal history of malignant neoplasm of breast: Secondary | ICD-10-CM | POA: Diagnosis not present

## 2018-01-04 DIAGNOSIS — K7689 Other specified diseases of liver: Secondary | ICD-10-CM | POA: Diagnosis present

## 2018-01-04 DIAGNOSIS — J45909 Unspecified asthma, uncomplicated: Secondary | ICD-10-CM | POA: Diagnosis not present

## 2018-01-04 DIAGNOSIS — Z923 Personal history of irradiation: Secondary | ICD-10-CM | POA: Diagnosis not present

## 2018-01-04 DIAGNOSIS — Z87891 Personal history of nicotine dependence: Secondary | ICD-10-CM | POA: Diagnosis not present

## 2018-01-04 DIAGNOSIS — R17 Unspecified jaundice: Secondary | ICD-10-CM | POA: Diagnosis not present

## 2018-01-04 DIAGNOSIS — L309 Dermatitis, unspecified: Secondary | ICD-10-CM | POA: Diagnosis present

## 2018-01-04 DIAGNOSIS — K760 Fatty (change of) liver, not elsewhere classified: Secondary | ICD-10-CM | POA: Diagnosis not present

## 2018-01-04 DIAGNOSIS — K259 Gastric ulcer, unspecified as acute or chronic, without hemorrhage or perforation: Secondary | ICD-10-CM | POA: Diagnosis not present

## 2018-01-04 DIAGNOSIS — Z806 Family history of leukemia: Secondary | ICD-10-CM | POA: Diagnosis not present

## 2018-01-04 DIAGNOSIS — Z882 Allergy status to sulfonamides status: Secondary | ICD-10-CM | POA: Diagnosis not present

## 2018-01-04 DIAGNOSIS — C3490 Malignant neoplasm of unspecified part of unspecified bronchus or lung: Secondary | ICD-10-CM | POA: Diagnosis not present

## 2018-01-04 DIAGNOSIS — K219 Gastro-esophageal reflux disease without esophagitis: Secondary | ICD-10-CM | POA: Diagnosis not present

## 2018-01-04 DIAGNOSIS — K754 Autoimmune hepatitis: Secondary | ICD-10-CM | POA: Diagnosis not present

## 2018-01-04 DIAGNOSIS — R945 Abnormal results of liver function studies: Secondary | ICD-10-CM | POA: Diagnosis not present

## 2018-01-04 DIAGNOSIS — K719 Toxic liver disease, unspecified: Secondary | ICD-10-CM | POA: Diagnosis present

## 2018-01-04 DIAGNOSIS — R918 Other nonspecific abnormal finding of lung field: Secondary | ICD-10-CM | POA: Diagnosis not present

## 2018-01-04 DIAGNOSIS — T50905A Adverse effect of unspecified drugs, medicaments and biological substances, initial encounter: Secondary | ICD-10-CM | POA: Diagnosis not present

## 2018-01-04 DIAGNOSIS — D721 Eosinophilia: Secondary | ICD-10-CM | POA: Diagnosis not present

## 2018-01-06 ENCOUNTER — Telehealth: Payer: Self-pay | Admitting: Gastroenterology

## 2018-01-06 LAB — ALPHA-1 ANTITRYPSIN PHENOTYPE: A-1 Antitrypsin, Ser: 179 mg/dL (ref 101–187)

## 2018-01-06 MED ORDER — LIDOCAINE HCL (PF) 1 % IJ SOLN
0.50 | INTRAMUSCULAR | Status: DC
Start: ? — End: 2018-01-06

## 2018-01-06 MED ORDER — GENERIC EXTERNAL MEDICATION
Status: DC
Start: ? — End: 2018-01-06

## 2018-01-06 MED ORDER — BUDESONIDE-FORMOTEROL FUMARATE 80-4.5 MCG/ACT IN AERO
2.00 | INHALATION_SPRAY | RESPIRATORY_TRACT | Status: DC
Start: 2018-01-08 — End: 2018-01-06

## 2018-01-06 MED ORDER — LEVOTHYROXINE SODIUM 75 MCG PO TABS
75.00 | ORAL_TABLET | ORAL | Status: DC
Start: 2018-01-09 — End: 2018-01-06

## 2018-01-06 MED ORDER — TRIAMCINOLONE ACETONIDE 0.1 % EX OINT
TOPICAL_OINTMENT | CUTANEOUS | Status: DC
Start: 2018-01-09 — End: 2018-01-06

## 2018-01-06 MED ORDER — ENOXAPARIN SODIUM 40 MG/0.4ML ~~LOC~~ SOLN
40.00 | SUBCUTANEOUS | Status: DC
Start: 2018-01-09 — End: 2018-01-06

## 2018-01-06 MED ORDER — ALBUTEROL SULFATE (5 MG/ML) 0.5% IN NEBU
2.50 | INHALATION_SOLUTION | RESPIRATORY_TRACT | Status: DC
Start: ? — End: 2018-01-06

## 2018-01-06 MED ORDER — MONTELUKAST SODIUM 10 MG PO TABS
10.00 | ORAL_TABLET | ORAL | Status: DC
Start: 2018-01-08 — End: 2018-01-06

## 2018-01-06 NOTE — Telephone Encounter (Signed)
Noted  

## 2018-01-06 NOTE — Telephone Encounter (Signed)
AB, I spoke with pt and she said she was admitted at [facility] on Saturday morning. Pt stated her lab work wasn't good and per [facility] she needed to be admitted. Called [facility] hospital and pt is admitted in [place] on the medical unit.

## 2018-01-06 NOTE — Progress Notes (Signed)
Inpatient at [facility].

## 2018-01-06 NOTE — Telephone Encounter (Signed)
Is it possible to have liver biopsy pathology read as stat? I am not sure how to do that or who to contact.   Can we check on the patient and see how she is doing today? Recheck INR, HFP today or tomorrow. May need referral to tertiary care if LFTs not stable.

## 2018-01-06 NOTE — Telephone Encounter (Signed)
I spoke with Amber in the pathology and she is going to see if they can expedite the read on the liver Bx.

## 2018-01-06 NOTE — Telephone Encounter (Signed)
Amber will fax the liver Bx results to our office within the next couple of hours.

## 2018-01-06 NOTE — Telephone Encounter (Signed)
Pathology results reviewed. Most consistent with drug/toxin-induced liver injury. She is at [facility] currently.   Results as below: Liver, needle/core biopsy, random - MODERATE TO SEVERE ACUTE/ SUBACUTE HEPATITIS WITH CHOLESTASIS, MOST CONSISTENT WITH DRUG/TOXININDUCED LIVER INJURY - NO EVIDENCE OF PATHOLOGIC FIBROSIS - SEE NOTE Diagnosis Note The biopsy is adequate for review and shows features of non-specific acute/subacute hepatitis characterized by moderate to severe portal and lobular inflammation (mixed infiltrate of lymphocytes, plasma cells, neutrophils, and rare eosinophils). There are numerous lobular necroinflammatory foci, including focal bridging necrosis and prominent centrilobular hepatocyte dropout. Hepatic lobules show marked canalicular and hepatocellular cholestasis. Native bile ducts and portal vascular structures are unremarkable. There is no evidence of pathologic fibrosis on trichrome and reticulin stains. PASD stain highlights fairly numerous individual ceroid laden portal macrophages and sinusoidal Kupffer cells are seen throughout the biopsy. Iron stain is negative. Overall, the features are nonspecific and consistent with moderate to severe acute/ subacute hepatitis, most likely from drug/toxin-mediated liver injury (including over the counter drugs and herbal agent). Differential diagnosis can include acute viral hepatitis. Histologic features of chronic or autoimmune hepatitis are not present. Clinical and serologic correlation is suggested.

## 2018-01-07 NOTE — Telephone Encounter (Signed)
Work-up in progress

## 2018-01-07 NOTE — Telephone Encounter (Signed)
Noted  

## 2018-01-09 ENCOUNTER — Telehealth: Payer: Self-pay | Admitting: Family

## 2018-01-09 ENCOUNTER — Telehealth: Payer: Self-pay

## 2018-01-09 DIAGNOSIS — E038 Other specified hypothyroidism: Secondary | ICD-10-CM

## 2018-01-09 MED ORDER — NYSTATIN 100000 UNIT/ML MT SUSP
5.00 | OROMUCOSAL | Status: DC
Start: 2018-01-08 — End: 2018-01-09

## 2018-01-09 MED ORDER — HYDROCORTISONE 2.5 % EX CREA
TOPICAL_CREAM | CUTANEOUS | Status: DC
Start: 2018-01-08 — End: 2018-01-09

## 2018-01-09 MED ORDER — METHYLPREDNISOLONE 16 MG PO TABS
56.00 | ORAL_TABLET | ORAL | Status: DC
Start: 2018-01-09 — End: 2018-01-09

## 2018-01-09 MED ORDER — LEVOTHYROXINE SODIUM 75 MCG PO TABS: ORAL_TABLET | ORAL | 3 refills | Status: DC

## 2018-01-09 MED ORDER — GENERIC EXTERNAL MEDICATION
2000.00 | Status: DC
Start: 2018-01-09 — End: 2018-01-09

## 2018-01-09 MED ORDER — CALCIUM CARBONATE ANTACID 750 MG PO CHEW
CHEWABLE_TABLET | ORAL | Status: DC
Start: 2018-01-09 — End: 2018-01-09

## 2018-01-09 MED ORDER — SENNOSIDES-DOCUSATE SODIUM 8.6-50 MG PO TABS
2.00 | ORAL_TABLET | ORAL | Status: DC
Start: ? — End: 2018-01-09

## 2018-01-09 NOTE — Telephone Encounter (Signed)
Pt aware refill sent to pharmacy 

## 2018-01-09 NOTE — Telephone Encounter (Signed)
PT is needing a refill on levothyroxine (SYNTHROID, LEVOTHROID) 75 MCG tablet (was never sent in when she was seen on 12/10)  Has to be written for 90 day supply   [facility] - [place], [place] [phone] (Phone) [phone] (Fax)

## 2018-01-09 NOTE — Telephone Encounter (Signed)
Pt called office, requested call back from AB. [phone]

## 2018-01-10 ENCOUNTER — Telehealth: Payer: Self-pay

## 2018-01-10 ENCOUNTER — Other Ambulatory Visit: Payer: Self-pay | Admitting: *Deleted

## 2018-01-10 NOTE — Telephone Encounter (Signed)
Returned patient's call regarding follow up with Dr. Lindi Adie d/t recent hospitalization for allergic reaction.  Left voicemail for patient to CB.

## 2018-01-10 NOTE — Telephone Encounter (Signed)
[name]: please cancel next week outpatient appt as well for 01/14/2018.

## 2018-01-10 NOTE — Telephone Encounter (Addendum)
Reviewed [facility] discharge summary. Most likely drug-induced hepatitis. On steroids till the 9th of March.   External hemorrhoids now with flare. Has cream from [facility]. Needs to postpone colonoscopy as well due to recent admission for hepatitis.   [facility] clinical pool: please cancel upcoming colonoscopy for 01/17/2018 Erline Levine: please arrange follow-up mid February office visit.  PCP is following blood work per patient. Will keep a look out for this as well.

## 2018-01-10 NOTE — Telephone Encounter (Signed)
Nurse spoke with patient.  Pt recently discharged from [facility]  Letrozole discontinue, patient needs follow up to establish new plan d/t recent hospitalization.  Patient scheduled accordingly.  No further needs at this time.

## 2018-01-13 ENCOUNTER — Encounter: Payer: Self-pay | Admitting: Internal Medicine

## 2018-01-13 ENCOUNTER — Telehealth: Payer: Self-pay | Admitting: Cardiology

## 2018-01-13 NOTE — Telephone Encounter (Signed)
Called Endo and spoke with Sheridan.. Procedure cancelled. Fowarding to [place] for appt

## 2018-01-13 NOTE — Telephone Encounter (Signed)
SCHEDULED AND LETTER SENT  °

## 2018-01-13 NOTE — Telephone Encounter (Signed)
FYI: Patient states she will never take a statin again. Was taking Crestor once a week   Was told she elevated lft's were elevated, has had liver biopsy,now doing well

## 2018-01-13 NOTE — Telephone Encounter (Signed)
Please give pt a call concerning her Crestor, she's had a reaction to it. Can be reached @ [phone]

## 2018-01-14 ENCOUNTER — Ambulatory Visit: Payer: Medicare Other | Admitting: Gastroenterology

## 2018-01-14 ENCOUNTER — Inpatient Hospital Stay: Payer: Medicare Other | Attending: Hematology and Oncology | Admitting: Hematology and Oncology

## 2018-01-14 ENCOUNTER — Telehealth: Payer: Self-pay | Admitting: Hematology and Oncology

## 2018-01-14 ENCOUNTER — Encounter

## 2018-01-14 DIAGNOSIS — Z87891 Personal history of nicotine dependence: Secondary | ICD-10-CM | POA: Insufficient documentation

## 2018-01-14 DIAGNOSIS — Z923 Personal history of irradiation: Secondary | ICD-10-CM | POA: Insufficient documentation

## 2018-01-14 DIAGNOSIS — R918 Other nonspecific abnormal finding of lung field: Secondary | ICD-10-CM | POA: Diagnosis not present

## 2018-01-14 DIAGNOSIS — E039 Hypothyroidism, unspecified: Secondary | ICD-10-CM | POA: Insufficient documentation

## 2018-01-14 DIAGNOSIS — Z170 Estrogen receptor positive status [ER+]: Secondary | ICD-10-CM | POA: Diagnosis not present

## 2018-01-14 DIAGNOSIS — C50411 Malignant neoplasm of upper-outer quadrant of right female breast: Secondary | ICD-10-CM

## 2018-01-14 DIAGNOSIS — K754 Autoimmune hepatitis: Secondary | ICD-10-CM | POA: Insufficient documentation

## 2018-01-14 DIAGNOSIS — R945 Abnormal results of liver function studies: Secondary | ICD-10-CM | POA: Diagnosis not present

## 2018-01-14 NOTE — Assessment & Plan Note (Addendum)
08/22/2017:Right lumpectomy: IDC grade 1, 1.3 cm, margins negative, negative for lymphovascular or perineural invasion, 0/2 lymph nodes negative, ER 100%, PR 100%, Ki-67 2%, HER-2 negative ratio 1.29 [id number] stage Ia Oncotype DX recurrence score 0: Distant recurrence of 9 years: 3% Adjuvant radiation therapy 09/26/2017 to 10/22/2017  Treatment plan: Adjuvant antiestrogen therapy with letrozole 2.5 mg daily started 10/08/2017 discontinued December 2019 due to hepatic toxicity  Letrozole toxicities: Severely increased LFTs into the thousands since letrozole was started.  It was discontinued in December 2019.  Breast cancer surveillance: 1.  Breast exam 01/14/2018: Benign 2.  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 11/03/2017: Doxycycline was prescribed for UTI 11/09/2017: Steroid Dosepak and Z-Pak prescribed for URI 11/11/2017: Letrozole was started 12/13/2017: Urine turned dark and back pain and blood work showed elevated AST ALT CT abdomen and pelvis performed for elevated LFTs: 12/25/2017: Gallbladder wall thickening, edema porta hepatis region small punctate nodules right lung base nonspecific  Liver biopsy random colon moderate to severe acute/subacute hepatitis with cholestasis consistent with drug or toxin induced liver injury Admitted at [facility] and treated with high-dose steroids  01/08/2018: AST ALT reduced significantly enough to be discharged home.  Steroids will continue until March 2020  Since it is not 599% certain that the autoimmune hepatitis was related to letrozole, the recommendation is to discontinue letrozole.  No further aromatase inhibitor therapy will be planned. We will continue surveillance with annual checkups and mammograms.  Return to clinic in 6 months with labs and follow-up and to make a decision on whether to use an alternate form of antiestrogen therapy like tamoxifen

## 2018-01-14 NOTE — Progress Notes (Signed)
Patient Care Team: Sharion Balloon, FNP as PCP - General (Family Medicine) Clent Jacks, MD as Consulting Physician (Ophthalmology) Gala Romney Cristopher Estimable, MD as Consulting Physician (Gastroenterology) Suella Broad, MD as Consulting Physician (Physical Medicine and Rehabilitation) Iran Planas, MD as Consulting Physician (Orthopedic Surgery) Richmond Campbell, MD as Consulting Physician (Gastroenterology) Nicholas Lose, MD as Consulting Physician (Hematology and Oncology) Erroll Luna, MD as Consulting Physician (General Surgery) Kyung Rudd, MD as Consulting Physician (Radiation Oncology)  DIAGNOSIS:    ICD-10-CM   1. Malignant neoplasm of upper-outer quadrant of right breast in female, estrogen receptor positive ([place]) C50.411 CBC with Differential ([facility] Only)   Z17.0 Victor ([place] only)    SUMMARY OF ONCOLOGIC HISTORY:   Malignant neoplasm of upper-outer quadrant of right breast in female, estrogen receptor positive ([place])   07/08/2017 Initial Diagnosis    Screening detected right breast mass 1.2 cm at 10 o'clock position 6 cm from the nipple axilla negative: Biopsy revealed invasive ductal carcinoma grade 1-2 with a low-grade DCIS, ER 100%, PR 100%, Ki-67 2%, HER-2 negative ratio 1.29, T1CN0 stage Ia AJCC 8    08/22/2017 Surgery    Right lumpectomy: IDC grade 1, 1.3 cm, margins negative, negative for lymphovascular or perineural invasion, 0/2 lymph nodes negative, ER 100%, PR 100%, Ki-67 2%, HER-2 negative ratio 1.29 T1CN0 stage Ia    09/06/2017 Oncotype testing    Oncotype DX recurrence score 0: Distant recurrence risk at 9 years is 3%    09/11/2017 Cancer Staging    Staging form: Breast, AJCC 8th Edition - Pathologic: Stage IA (pT1c, pN0, cM0, G1, ER+, PR+, HER2-, Oncotype DX score: 0) - Signed by Gardenia Phlegm, NP on 09/11/2017    09/26/2017 - 10/22/2017 Radiation Therapy    Adjuvant radiation therapy     CHIEF COMPLIANT: Follow up of high LFTs and liver  biopsy  INTERVAL HISTORY: Cheryl Perry is a 73 y.o. with above-mentioned history of right breast cancer treated with lumpectomy and radiation who is currently on anti-estrogen therapy. Her most recent bone scan from 12/18/17 show a T-score of -1.8. An abdominal CT on 12/25/17 showed mild edema of the liver, and a biopsy of the liver that followed on 01/02/18 showed moderate to severe acute hepatitis brought on by drug/toxin-mediated liver injury. She presents to the clinic alone today. She started Crestor in July, took Doxycycline on 11/03/17 for a UTI which led to a chest cold 5 days later for which she received steroids and a z-pac and felt better. A few days later, around 11/12/17 she began letrozole. Around Thanksgiving she felt fatigued and had dark urine and back pain. On 01/03/18 she went to [facility] to test her liver, where she broke out in a rash and had started to be jaundiced, and they advised her not to take statins, probiotics, or letrozole. Lab work from 01/08/2018 showed AST 433, ALT 529, bilirubin 9.4, ANA positive 1:2560 speckled pattern, anti-RO antibody positive, ANC ab negative, anti-SMAB negative, LKM antibody negative. She is currently on steroids.  REVIEW OF SYSTEMS:   Constitutional: Denies fevers, chills or abnormal weight loss Eyes: Denies blurriness of vision Ears, nose, mouth, throat, and face: Denies mucositis or sore throat Respiratory: Denies cough, dyspnea or wheezes Cardiovascular: Denies palpitation, chest discomfort Gastrointestinal:  Denies nausea, heartburn or change in bowel habits Skin: Denies abnormal skin rashes Lymphatics: Denies new lymphadenopathy or easy bruising Neurological:Denies numbness, tingling or new weaknesses Behavioral/Psych: Mood is stable, no new changes  Extremities: No lower  extremity edema Breast: denies any pain or lumps or nodules in either breasts All other systems were reviewed with the patient and are negative.  I have reviewed the  past medical history, past surgical history, social history and family history with the patient and they are unchanged from previous note.  ALLERGIES:  is allergic to naproxen; lipitor [atorvastatin]; premarin [conjugated estrogens]; statins; ampicillin; avelox [moxifloxacin hcl in nacl]; bactrim [sulfamethoxazole-trimethoprim]; levaquin [levofloxacin in d5w]; livalo [pitavastatin]; macrodantin [nitrofurantoin macrocrystal]; and trental [pentoxifylline er].  MEDICATIONS:  Current Outpatient Medications  Medication Sig Dispense Refill  . albuterol (PROVENTIL HFA) 108 (90 BASE) MCG/ACT inhaler Inhale 2 puffs into the lungs every 6 (six) hours as needed for wheezing. May use only 3 times a month    . B Complex Vitamins (VITAMIN B COMPLEX PO) Take 1 tablet by mouth daily.    . Calcium Carbonate (CALCIUM 600 PO) Take 1,200 mg by mouth daily.     . Cholecalciferol (VITAMIN D) 2000 UNITS CAPS Take 2,000 Units by mouth daily.     Marland Kitchen EPINEPHrine 0.3 mg/0.3 mL IJ SOAJ injection Inject 0.3 mg into the muscle once.   0  . fluticasone-salmeterol (ADVAIR HFA) 45-21 MCG/ACT inhaler Inhale 2 puffs into the lungs 2 (two) times daily. 3 Inhaler 3  . levothyroxine (SYNTHROID, LEVOTHROID) 75 MCG tablet TAKE 1 TABLET (75 MCG TOTAL)  BY MOUTH DAILY BEFORE BREAKFAST. 90 tablet 3  . loratadine (CLARITIN) 10 MG tablet Take 10 mg by mouth daily.    . methocarbamol (ROBAXIN) 750 MG tablet Take 375 mg by mouth every 6 (six) hours as needed for muscle spasms.   2  . montelukast (SINGULAIR) 10 MG tablet Take 10 mg by mouth at bedtime.    . pantoprazole (PROTONIX) 40 MG tablet Take 1 tablet (40 mg total) by mouth daily before breakfast. 90 tablet 3   No current facility-administered medications for this visit.     PHYSICAL EXAMINATION: ECOG PERFORMANCE STATUS: 1 - Symptomatic but completely ambulatory  Vitals:   01/14/18 1014  BP: (!) 145/83  Pulse: (!) 56  Resp: 18  Temp: 97.8 F (36.6 C)  SpO2: 99%   Filed  Weights   01/14/18 1014  Weight: 162 lb 14.4 oz (73.9 kg)    GENERAL:alert, no distress and comfortable SKIN: skin color, texture, turgor are normal, no rashes or significant lesions EYES: normal, Conjunctiva are pink and non-injected, sclera clear OROPHARYNX:no exudate, no erythema and lips, buccal mucosa, and tongue normal  NECK: supple, thyroid normal size, non-tender, without nodularity LYMPH:  no palpable lymphadenopathy in the cervical, axillary or inguinal LUNGS: clear to auscultation and percussion with normal breathing effort HEART: regular rate & rhythm and no murmurs and no lower extremity edema ABDOMEN:abdomen soft, non-tender and normal bowel sounds MUSCULOSKELETAL:no cyanosis of digits and no clubbing  NEURO: alert & oriented x 3 with fluent speech, no focal motor/sensory deficits EXTREMITIES: No lower extremity edema  LABORATORY DATA:  I have reviewed the data as listed CMP Latest Ref Rng & Units 01/03/2018 12/30/2017 12/27/2017  Glucose 65 - 99 mg/dL - - -  BUN 8 - 27 mg/dL - - -  Creatinine 0.57 - 1.00 mg/dL - - -  Sodium 134 - 144 mmol/L - - -  Potassium 3.5 - 5.2 mmol/L - - -  Chloride 96 - 106 mmol/L - - -  CO2 20 - 29 mmol/L - - -  Calcium 8.7 - 10.3 mg/dL - - -  Total Protein 6.0 - 8.5 g/dL  5.3(L) 6.4(L) 6.3(L)  Total Bilirubin 0.0 - 1.2 mg/dL 9.9(H) 6.0(H) 3.5(H)  Alkaline Phos 39 - 117 IU/L 355(H) 277(H) 256(H)  AST 0 - 40 IU/L 1,245(HH) 1,459(H) 1,433(H)  ALT 0 - 32 IU/L 928(HH) 1,151(H) 1,211(H)    Lab Results  Component Value Date   WBC 6.8 01/02/2018   HGB 15.8 (H) 01/02/2018   HCT 47.3 (H) 01/02/2018   MCV 95.9 01/02/2018   PLT 224 01/02/2018   NEUTROABS 2.6 12/25/2017    ASSESSMENT & PLAN:  Malignant neoplasm of upper-outer quadrant of right breast in female, estrogen receptor positive ([place]) 08/22/2017:Right lumpectomy: IDC grade 1, 1.3 cm, margins negative, negative for lymphovascular or perineural invasion, 0/2 lymph nodes negative, ER  100%, PR 100%, Ki-67 2%, HER-2 negative ratio 1.29 T1CN0 stage Ia Oncotype DX recurrence score 0: Distant recurrence of 9 years: 3% Adjuvant radiation therapy 09/26/2017 to 10/22/2017  Treatment plan: Adjuvant antiestrogen therapy with letrozole 2.5 mg daily started 10/08/2017 discontinued December 2019 due to hepatic toxicity  Letrozole toxicities: Severely increased LFTs into the thousands since letrozole was started.  It was discontinued in December 2019.  Breast cancer surveillance: 1.  Breast exam 01/14/2018: Benign 2.  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 11/03/2017: Doxycycline was prescribed for UTI 11/09/2017: Steroid Dosepak and Z-Pak prescribed for URI 11/11/2017: Letrozole was started 12/13/2017: Urine turned dark and back pain and blood work showed elevated AST ALT CT abdomen and pelvis performed for elevated LFTs: 12/25/2017: Gallbladder wall thickening, edema porta hepatis region small punctate nodules right lung base nonspecific  Liver biopsy random colon moderate to severe acute/subacute hepatitis with cholestasis consistent with drug or toxin induced liver injury Admitted at [facility] and treated with high-dose steroids  01/08/2018: AST ALT reduced significantly enough to be discharged home.  Steroids will continue until March 2020  Since it is not 680% certain that the autoimmune hepatitis was related to letrozole, the recommendation is to discontinue letrozole.  No further aromatase inhibitor therapy will be planned. We will continue surveillance with annual checkups and mammograms.  Return to clinic in 6 months with labs and follow-up and to make a decision on whether to use an alternate form of antiestrogen therapy like tamoxifen    Orders Placed This Encounter  Procedures  . CBC with Differential ([facility] Only)    Standing Status:   Future    Standing Expiration Date:   01/15/2019  . CMP ([facility] only)    Standing Status:   Future    Standing Expiration Date:   01/15/2019   The patient has a good understanding of the overall plan. she agrees with it. she will call with any problems that may develop before the next visit here.  Nicholas Lose, MD 01/14/2018   I, Cloyde Reams Dorshimer, am acting as scribe for Nicholas Lose, MD.  I have reviewed the above documentation for accuracy and completeness, and I agree with the above.

## 2018-01-14 NOTE — Telephone Encounter (Signed)
Gave avs and calendar ° °

## 2018-01-17 ENCOUNTER — Ambulatory Visit (HOSPITAL_COMMUNITY): Admit: 2018-01-17 | Payer: Medicare Other | Admitting: Internal Medicine

## 2018-01-17 ENCOUNTER — Encounter (HOSPITAL_COMMUNITY): Payer: Self-pay

## 2018-01-17 SURGERY — COLONOSCOPY
Anesthesia: Moderate Sedation

## 2018-01-20 ENCOUNTER — Telehealth: Payer: Self-pay | Admitting: Family

## 2018-01-20 DIAGNOSIS — M545 Low back pain, unspecified: Secondary | ICD-10-CM

## 2018-01-20 DIAGNOSIS — J45909 Unspecified asthma, uncomplicated: Secondary | ICD-10-CM

## 2018-01-20 DIAGNOSIS — G8929 Other chronic pain: Secondary | ICD-10-CM

## 2018-01-20 DIAGNOSIS — K219 Gastro-esophageal reflux disease without esophagitis: Secondary | ICD-10-CM

## 2018-01-20 DIAGNOSIS — E038 Other specified hypothyroidism: Secondary | ICD-10-CM

## 2018-01-20 NOTE — Telephone Encounter (Signed)
Provider out of office.   This will be addressed when she returns.

## 2018-01-21 NOTE — Telephone Encounter (Signed)
Lab work is already placed for CBC and CMP from [employer]

## 2018-01-21 NOTE — Telephone Encounter (Signed)
Patient aware.

## 2018-01-22 ENCOUNTER — Other Ambulatory Visit: Payer: Self-pay

## 2018-01-22 ENCOUNTER — Telehealth: Payer: Self-pay

## 2018-01-22 ENCOUNTER — Other Ambulatory Visit: Payer: Medicare Other

## 2018-01-22 DIAGNOSIS — Z170 Estrogen receptor positive status [ER+]: Principal | ICD-10-CM

## 2018-01-22 DIAGNOSIS — C50411 Malignant neoplasm of upper-outer quadrant of right female breast: Secondary | ICD-10-CM | POA: Diagnosis not present

## 2018-01-22 NOTE — Telephone Encounter (Signed)
Spoke with patient reminding of SCP visit with NP on 01/27/18 at 2 pm.  Patient says she is looking forward to visit.

## 2018-01-23 ENCOUNTER — Ambulatory Visit (INDEPENDENT_AMBULATORY_CARE_PROVIDER_SITE_OTHER): Payer: Medicare Other | Admitting: Family

## 2018-01-23 ENCOUNTER — Encounter: Payer: Self-pay | Admitting: Family

## 2018-01-23 VITALS — BP 108/75 | HR 69 | Temp 97.6°F | Ht 66.0 in | Wt 163.2 lb

## 2018-01-23 DIAGNOSIS — T7840XS Allergy, unspecified, sequela: Secondary | ICD-10-CM | POA: Diagnosis not present

## 2018-01-23 DIAGNOSIS — Z09 Encounter for follow-up examination after completed treatment for conditions other than malignant neoplasm: Secondary | ICD-10-CM

## 2018-01-23 DIAGNOSIS — C50411 Malignant neoplasm of upper-outer quadrant of right female breast: Secondary | ICD-10-CM | POA: Diagnosis not present

## 2018-01-23 DIAGNOSIS — Z170 Estrogen receptor positive status [ER+]: Secondary | ICD-10-CM

## 2018-01-23 DIAGNOSIS — R748 Abnormal levels of other serum enzymes: Secondary | ICD-10-CM | POA: Diagnosis not present

## 2018-01-23 LAB — CBC WITH DIFFERENTIAL
Basophils Absolute: 0 10*3/uL (ref 0.0–0.2)
Basos: 0 %
EOS (ABSOLUTE): 0.1 10*3/uL (ref 0.0–0.4)
Eos: 1 %
Hematocrit: 41.3 % (ref 34.0–46.6)
Hemoglobin: 13.9 g/dL (ref 11.1–15.9)
Immature Grans (Abs): 0.1 10*3/uL (ref 0.0–0.1)
Immature Granulocytes: 1 %
Lymphocytes Absolute: 1.6 10*3/uL (ref 0.7–3.1)
Lymphs: 14 %
MCH: 32.6 pg (ref 26.6–33.0)
MCHC: 33.7 g/dL (ref 31.5–35.7)
MCV: 97 fL (ref 79–97)
MONOS ABS: 0.8 10*3/uL (ref 0.1–0.9)
Monocytes: 7 %
NEUTROS ABS: 8.5 10*3/uL — AB (ref 1.4–7.0)
Neutrophils: 77 %
RBC: 4.26 x10E6/uL (ref 3.77–5.28)
RDW: 13.6 % (ref 11.7–15.4)
WBC: 11.1 10*3/uL — ABNORMAL HIGH (ref 3.4–10.8)

## 2018-01-23 LAB — COMPREHENSIVE METABOLIC PANEL
ALT: 110 IU/L — ABNORMAL HIGH (ref 0–32)
AST: 52 IU/L — ABNORMAL HIGH (ref 0–40)
Albumin/Globulin Ratio: 2.1 (ref 1.2–2.2)
Albumin: 3.9 g/dL (ref 3.5–4.8)
Alkaline Phosphatase: 145 IU/L — ABNORMAL HIGH (ref 39–117)
BUN/Creatinine Ratio: 19 (ref 12–28)
BUN: 13 mg/dL (ref 8–27)
Bilirubin Total: 2.1 mg/dL — ABNORMAL HIGH (ref 0.0–1.2)
CHLORIDE: 102 mmol/L (ref 96–106)
CO2: 25 mmol/L (ref 20–29)
Calcium: 9.3 mg/dL (ref 8.7–10.3)
Creatinine, Ser: 0.69 mg/dL (ref 0.57–1.00)
GFR calc Af Amer: 101 mL/min/{1.73_m2} (ref >59)
GFR calc non Af Amer: 87 mL/min/{1.73_m2} (ref >59)
GLOBULIN, TOTAL: 1.9 g/dL (ref 1.5–4.5)
Glucose: 67 mg/dL (ref 65–99)
Potassium: 3.9 mmol/L (ref 3.5–5.2)
Sodium: 141 mmol/L (ref 134–144)
Total Protein: 5.8 g/dL — ABNORMAL LOW (ref 6.0–8.5)

## 2018-01-23 NOTE — Progress Notes (Signed)
   Subjective:    Patient ID: Cheryl Perry, female    DOB: 1946/01/05, 73 y.o.   MRN: [id number]  Chief Complaint  Patient presents with  . Hospitalization Follow-up    HPI Pt presents to the office today for hospital follow up for elevated liver enzymes. She was admitted on 01/04/18. She had a liver biopsy   on 01/02/18 that showed she was having an allergic reaction and not mets. As she is currently being treated for right breast cancer.   Her liver enzymes got as high as AST 1459  and ALT 1151 on  12/30/17. Her lab  Work yesterday was AST 52 and ALT 110.   She was taken off her Crestor, Letrozole, and probiotic since 12/31/17. She was started on Medrol 70 mg/day. She is currently tampering down and is on 44 mg daily. She will continue until 03/13/18.    Review of Systems  All other systems reviewed and are negative.      Objective:   Physical Exam Vitals signs reviewed.  Constitutional:      General: She is not in acute distress.    Appearance: She is well-developed.  HENT:     Head: Normocephalic and atraumatic.     Right Ear: Tympanic membrane normal.     Left Ear: Tympanic membrane normal.  Eyes:     Pupils: Pupils are equal, round, and reactive to light.  Neck:     Musculoskeletal: Normal range of motion and neck supple.     Thyroid: No thyromegaly.  Cardiovascular:     Rate and Rhythm: Normal rate and regular rhythm.     Heart sounds: Normal heart sounds. No murmur.  Pulmonary:     Effort: Pulmonary effort is normal. No respiratory distress.     Breath sounds: Normal breath sounds. No wheezing.  Abdominal:     General: Bowel sounds are normal. There is no distension.     Palpations: Abdomen is soft.     Tenderness: There is no abdominal tenderness.  Musculoskeletal: Normal range of motion.        General: No tenderness.  Skin:    General: Skin is warm and dry.     Coloration: Skin is jaundiced.  Neurological:     Mental Status: She is alert and oriented  to person, place, and time.     Cranial Nerves: No cranial nerve deficit.     Deep Tendon Reflexes: Reflexes are normal and symmetric.  Psychiatric:        Behavior: Behavior normal.        Thought Content: Thought content normal.        Judgment: Judgment normal.       BP 108/75   Pulse 69   Temp 97.6 F (36.4 C) (Oral)   Ht 5\' 6"  (1.676 m)   Wt 163 lb 3.2 oz (74 kg)   BMI 26.34 kg/m      Assessment & Plan:  Cheryl Perry comes in today with chief complaint of Hospitalization Follow-up   Diagnosis and orders addressed:  1. Abnormal liver enzymes  2. Allergic reaction, sequela  3. Hospital discharge follow-up  4. Malignant neoplasm of upper-outer quadrant of right breast in female, estrogen receptor positive ([place])   Keep all follow up with GI and Oncologists Labs reviewed that she had drawn yesterday Continue to Avoid tylenol and alcohol  Continue Medrol daily RTO in 3 months    [name], FNP

## 2018-01-23 NOTE — Patient Instructions (Signed)
Liver Function Tests Why am I having this test? Liver function tests are done to see how well your liver is working. The proteins and enzymes measured in the test can alert your health care provider to inflammation, damage, or disease in your liver. It is common to have liver function tests:  When you are taking certain medicines.  If you have liver disease.  If you drink a lot of alcohol.  When you are not feeling well.  When you have other conditions that may affect your liver.  During annual physical exams.  If you have symptoms such as yellowing of the skin (jaundice), abdominal pain, or nausea and vomiting. What is being tested? These tests measure various substances in your blood. This may include:  Alanine transaminase (ALT). This is an enzyme in the liver.  Aspartate transaminase (AST). This is an enzyme in the liver, heart, and muscles.  Alkaline phosphatase (ALP). This is a protein in the liver, bile ducts, bone, and other body tissues.  Total bilirubin. This is a yellow pigment in bile.  Albumin. This is a protein in the liver.  Prothrombin time and international normalized ratio (PT and INR). PT measures the time it takes for your blood to clot. INR is a calculation of blood clotting time based on your PT result. It is also calculated based on normal ranges defined by the lab that processed your test.  Total protein. This includes two proteins, albumin and globulin, found in the blood. What kind of sample is taken?  A blood sample is required for this test. It is usually collected by inserting a needle into a blood vessel. How do I prepare for this test? How you prepare will depend on which tests are being done and the reason for doing them. You may need to:  Avoid eating for 4-6 hours before the test, or as told by your health care provider.  Stop taking certain medicines before your blood test, as told by your health care provider. Tell a health care provider  about:  All medicines you are taking, including vitamins, herbs, eye drops, creams, and over-the-counter medicines.  Any medical conditions you have.  Whether you are pregnant or may be pregnant. How are the results reported? Your test results will be reported as values. Your health care provider will compare your results to normal ranges that were established after testing a large group of people (reference ranges). Reference ranges may vary among labs and hospitals. For the substances measured in liver function tests, common reference ranges are: ALT  Infant: 10-40 international units/L.  Child or adult: 4-36 international units/L at 37C or 4-36 units/L (SI units).  Reference ranges may be higher for older adults. AST  Newborn 36-28 days old: 35-140 units/L.  Child younger than 97 years old: 15-60 units/L.  42-72 years old: 15-50 units/L.  82-67 years old: 10-50 units/L.  75-57 years old: 10-40 units/L.  Adult: 0-35 units/L or 0-0.58 ?kat/L (SI units).  Reference ranges may be higher for older adults. ALP  Child younger than 58 years old: 85-235 units/L.  67-36 years old: 65-210 units/L.  63-1 years old: 60-300 units/L.  22-60 years old: 30-200 units/L.  Adult: 30-120 units/L or 0.5-2.0 ?kat/L (SI units).  Reference ranges may be higher for older adults. Total bilirubin  Newborn: 1.0-12.0 mg/dL or 17.1-205 ?mol/L (SI units).  Child or adult: 0.3-1.0 mg/dL or 5.1-17 ?mol/L. Albumin  Premature infant: 3.0-4.2 g/dL.  Newborn: 3.5-5.4 g/dL.  Infant: 4.4-5.4 g/dL.  Child:  4.0-5.9 g/dL.  Adult: 3.5-5.0 g/dL or 35-50 g/L (SI units). PT  11.0-12.5 seconds; 85%-100%. INR  0.8-1.1. Total protein  Premature infant: 4.2-7.6 g/dL.  Newborn: 4.6-7.4 g/dL.  Infant: 6.0-6.7 g/dL.  Child: 6.2-8.0 g/dL.  Adult: 6.4-8.3 g/dL or 64-83 g/L (SI units). What do the results mean? Results that are within the reference ranges are considered normal. For each substance  measured, results outside the reference range can indicate various health issues. ALT  Levels above the normal range may indicate liver disease. AST  Levels above the normal range may indicate liver disease. Sometimes levels also increase after burns, surgery, heart attack, muscle damage, or seizure. ALP  Levels above the normal range may be seen in biliary obstruction, liver diseases, bone disease, thyroid disease, tumors, fractures, leukemia, lymphoma, or several other conditions. People with blood type O or B may show higher levels after a fatty meal.  Levels below the normal range may indicate bone and teeth conditions, malnutrition, protein deficiency, or Wilson's disease. Total bilirubin  Levels above the normal range may indicate problems with the liver, gallbladder, or bile ducts. Albumin  Levels above the normal range may indicate dehydration. They may also be caused by a diet that is high in protein.  Levels below the normal range may indicate kidney disease, liver disease, or malabsorption of nutrients. PT and INR  Levels above the normal range mean that your blood is clotting slower than normal. This may be due to blood disorders, liver disorders, or low levels of vitamin K. Total protein  Levels above the normal range may be due to infection or other diseases.  Levels below the normal range may be due to an immune system disorder, bleeding, burns, kidney disorder, liver disease, trouble absorbing or getting nutrients, or other conditions that affect the intestines. Talk with your health care provider about what your results mean. Questions to ask your health care provider Ask your health care provider, or the department that is doing the test:  When will my results be ready?  How will I get my results?  What are my treatment options?  What other tests do I need?  What are my next steps? Summary  Liver function tests are done to see how well your liver is  working.  These tests measure various proteins and enzymes in your blood. The results can alert your health care provider to inflammation, damage, or disease in your liver.  Talk with your health care provider about what your results mean. This information is not intended to replace advice given to you by your health care provider. Make sure you discuss any questions you have with your health care provider. Document Released: 01/28/2004 Document Revised: 10/09/2016 Document Reviewed: 10/09/2016 Elsevier Interactive Patient Education  2019 [employer].

## 2018-01-24 DIAGNOSIS — T50905A Adverse effect of unspecified drugs, medicaments and biological substances, initial encounter: Secondary | ICD-10-CM | POA: Diagnosis not present

## 2018-01-24 DIAGNOSIS — L270 Generalized skin eruption due to drugs and medicaments taken internally: Secondary | ICD-10-CM | POA: Diagnosis not present

## 2018-01-24 DIAGNOSIS — D721 Eosinophilia: Secondary | ICD-10-CM | POA: Diagnosis not present

## 2018-01-27 ENCOUNTER — Encounter: Payer: Self-pay | Admitting: Adult Health

## 2018-01-27 ENCOUNTER — Inpatient Hospital Stay (HOSPITAL_BASED_OUTPATIENT_CLINIC_OR_DEPARTMENT_OTHER): Payer: Medicare Other | Admitting: Adult Health

## 2018-01-27 VITALS — BP 118/68 | HR 71 | Temp 98.0°F | Resp 18 | Ht 66.0 in | Wt 165.6 lb

## 2018-01-27 DIAGNOSIS — Z923 Personal history of irradiation: Secondary | ICD-10-CM | POA: Diagnosis not present

## 2018-01-27 DIAGNOSIS — R945 Abnormal results of liver function studies: Secondary | ICD-10-CM

## 2018-01-27 DIAGNOSIS — Z170 Estrogen receptor positive status [ER+]: Secondary | ICD-10-CM | POA: Diagnosis not present

## 2018-01-27 DIAGNOSIS — C50411 Malignant neoplasm of upper-outer quadrant of right female breast: Secondary | ICD-10-CM

## 2018-01-27 DIAGNOSIS — R918 Other nonspecific abnormal finding of lung field: Secondary | ICD-10-CM | POA: Diagnosis not present

## 2018-01-27 DIAGNOSIS — E039 Hypothyroidism, unspecified: Secondary | ICD-10-CM | POA: Diagnosis not present

## 2018-01-27 DIAGNOSIS — K754 Autoimmune hepatitis: Secondary | ICD-10-CM | POA: Diagnosis not present

## 2018-01-27 NOTE — Progress Notes (Signed)
CLINIC:  Survivorship   REASON FOR VISIT:  Routine follow-up post-treatment for a recent history of breast cancer.  BRIEF ONCOLOGIC HISTORY:    Malignant neoplasm of upper-outer quadrant of right breast in female, estrogen receptor positive ([place])   07/08/2017 Initial Diagnosis    Screening detected right breast mass 1.2 cm at 10 o'clock position 6 cm from the nipple axilla negative: Biopsy revealed invasive ductal carcinoma grade 1-2 with a low-grade DCIS, ER 100%, PR 100%, Ki-67 2%, HER-2 negative ratio 1.29, T1CN0 stage Ia AJCC 8    08/22/2017 Surgery    Right lumpectomy: IDC grade 1, 1.3 cm, margins negative, negative for lymphovascular or perineural invasion, 0/2 lymph nodes negative, ER 100%, PR 100%, Ki-67 2%, HER-2 negative ratio 1.29 T1CN0 stage Ia    09/06/2017 Oncotype testing    Oncotype DX recurrence score 0: Distant recurrence risk at 9 years is 3%    09/11/2017 Cancer Staging    Staging form: Breast, AJCC 8th Edition - Pathologic: Stage IA (pT1c, pN0, cM0, G1, ER+, PR+, HER2-, Oncotype DX score: 0) - Signed by Gardenia Phlegm, NP on 09/11/2017    09/26/2017 - 10/22/2017 Radiation Therapy    Adjuvant radiation therapy     INTERVAL HISTORY:  Cheryl Perry presents to the [facility] today for our initial meeting to review her survivorship care plan detailing her treatment course for breast cancer, as well as monitoring long-term side effects of that treatment, education regarding health maintenance, screening, and overall wellness and health promotion.     Overall, Cheryl Perry has had an eventful past month.  In December, she thought she had a kidney infection, however her symptoms progressed, and her LFTs increased and she was admitted to [facility]  She was discharged and will f/u with her GI doctor at [facility].  She is a former smoker of a couple of years and quit in 1975.      REVIEW OF SYSTEMS:  Review of Systems  Constitutional: Positive for fatigue.  Negative for appetite change, chills, fever and unexpected weight change.  HENT:   Negative for hearing loss, lump/mass and trouble swallowing.   Eyes: Negative for eye problems and icterus.  Respiratory: Negative for chest tightness, cough and shortness of breath.   Cardiovascular: Negative for chest pain, leg swelling and palpitations.  Gastrointestinal: Negative for abdominal distention, abdominal pain, constipation, diarrhea, nausea and vomiting.  Endocrine: Negative for hot flashes.  Skin: Negative for itching and rash.  Neurological: Negative for dizziness, extremity weakness, headaches and numbness.  Hematological: Negative for adenopathy. Does not bruise/bleed easily.  Psychiatric/Behavioral: Negative for depression. The patient is not nervous/anxious.   Breast: Denies any new nodularity, masses, tenderness, nipple changes, or nipple discharge.      ONCOLOGY TREATMENT TEAM:  1. Surgeon:  Dr. Brantley Stage at [facility] 2. Medical Oncologist: Dr. Lindi Adie  3. Radiation Oncologist: Dr. Lisbeth Renshaw    PAST MEDICAL/SURGICAL HISTORY:  Past Medical History:  Diagnosis Date  . Allergy   . Arthritis    back painherniated disc lumbar  . Asthma   . Back pain   . Cancer ([place]) 07/2017   right breast cancer, lumpectomy and 20 sessions of XRT  . Complication of anesthesia   . GERD (gastroesophageal reflux disease)   . Hiatal hernia   . Hyperlipidemia   . Hypothyroidism   . Osteopenia   . PONV (postoperative nausea and vomiting)    asks for scop patch  . Thyroid disease   . Vasculopathy  lymphatic   Past Surgical History:  Procedure Laterality Date  . BREAST LUMPECTOMY WITH RADIOACTIVE SEED AND SENTINEL LYMPH NODE BIOPSY Right 08/22/2017   Procedure: BREAST LUMPECTOMY WITH RADIOACTIVE SEED AND SENTINEL LYMPH NODE BIOPSY;  Surgeon: Erroll Luna, MD;  Location: [facility];  Service: General;  Laterality: Right;  . COLONOSCOPY  2015   Dr. Earlean Shawl: Hemorrhoids,  diverticulosis.  Next colonoscopy in 5 years due to previous history of adenomatous colon polyps  . CYSTOSCOPY    . cystscopy    . ESOPHAGOGASTRODUODENOSCOPY N/A 01/28/2015   Dr.Rourk- 3 small prepyloric/gastric ulcers- likely culprits causing bleeding. hiatal hernia, small gastic polyps not manipulated.  . ESOPHAGOGASTRODUODENOSCOPY N/A 05/26/2015   Dr. Gala Romney: Small hiatal hernia, previous gastric ulcers completely healed.  Marland Kitchen KNEE SURGERY    . TONSILLECTOMY    . TUBAL LIGATION       ALLERGIES:  Allergies  Allergen Reactions  . Statins Other (See Comments)    Elevated LFT's  . Sulfa Antibiotics Hives  . Naproxen Hives and Itching    Note - also has Ulcer - cant take   . Lipitor [Atorvastatin]     mylagias  . Premarin [Conjugated Estrogens] Itching    tightness in chest  . Ampicillin Rash  . Avelox [Moxifloxacin Hcl In Nacl] Rash  . Azithromycin Rash    Concern for DRESS after useage  . Bactrim [Sulfamethoxazole-Trimethoprim] Rash  . Crestor [Rosuvastatin Calcium]     Elevated liver results?  . Doxycycline Rash    Concern for DRESS after usage.  [place] [Levofloxacin In D5w] Rash  . Livalo [Pitavastatin] Other (See Comments)    myalgias  . Macrodantin [Nitrofurantoin Macrocrystal] Hives and Rash  . Trental [Pentoxifylline Er] Rash          CURRENT MEDICATIONS:  Outpatient Encounter Medications as of 01/27/2018  Medication Sig Note  . albuterol (PROVENTIL HFA) 108 (90 BASE) MCG/ACT inhaler Inhale 2 puffs into the lungs every 6 (six) hours as needed for wheezing. May use only 3 times a month   . Calcium Carbonate (CALCIUM 600 PO) Take 1,200 mg by mouth daily.    . Cholecalciferol (VITAMIN D) 2000 UNITS CAPS Take 2,000 Units by mouth daily.    Marland Kitchen EPINEPHrine 0.3 mg/0.3 mL IJ SOAJ injection Inject 0.3 mg into the muscle once.    . fluticasone-salmeterol (ADVAIR HFA) 45-21 MCG/ACT inhaler Inhale 2 puffs into the lungs 2 (two) times daily.   . hydrocortisone (ANUSOL-HC)  2.5 % rectal cream PLACE RECTALLY 2 (TWO) TIMES DAILY   . levothyroxine (SYNTHROID, LEVOTHROID) 75 MCG tablet TAKE 1 TABLET (75 MCG TOTAL)  BY MOUTH DAILY BEFORE BREAKFAST.   Marland Kitchen loratadine (CLARITIN) 10 MG tablet Take 10 mg by mouth daily.   . methocarbamol (ROBAXIN) 750 MG tablet Take 375 mg by mouth every 6 (six) hours as needed for muscle spasms.    . methylPREDNISolone (MEDROL) 4 MG tablet    . montelukast (SINGULAIR) 10 MG tablet Take 10 mg by mouth at bedtime.   Marland Kitchen nystatin (MYCOSTATIN) 100000 UNIT/ML suspension SWISH AND SWALLOW 5 MLS 4 (FOUR) TIMES DAILY   . pantoprazole (PROTONIX) 40 MG tablet Take 1 tablet (40 mg total) by mouth daily before breakfast.   . triamcinolone ointment (KENALOG) 0.1 % Apply topically.   . B Complex Vitamins (VITAMIN B COMPLEX PO) Take 1 tablet by mouth daily. 12/27/2017: Currently on hold   No facility-administered encounter medications on file as of 01/27/2018.      ONCOLOGIC FAMILY  HISTORY:  Family History  Problem Relation Age of Onset  . Stroke Mother   . Hypertension Mother   . Alzheimer's disease Mother   . Heart disease Sister   . Hypertension Sister   . Heart attack Sister        Psychologist, forensic   . Arthritis Daughter        very bad MVA   . Hypertension Son   . Seizures Son   . Colon cancer Neg Hx   . Liver cancer Neg Hx   . Pancreatic disease Neg Hx      GENETIC COUNSELING/TESTING: Not at this time  SOCIAL HISTORY:  Social History   Socioeconomic History  . Marital status: Married    Spouse name: Not on file  . Number of children: Not on file  . Years of education: Not on file  . Highest education level: Not on file  Occupational History  . Occupation: retired     Comment: HR work  / Astronomer   Social Needs  . Financial resource strain: Not on file  . Food insecurity:    Worry: Not on file    Inability: Not on file  . Transportation needs:    Medical: Not on file    Non-medical: Not on file  Tobacco  Use  . Smoking status: Former Smoker    Last attempt to quit: 06/06/1974    Years since quitting: 43.6  . Smokeless tobacco: Never Used  . Tobacco comment: Quit x 40 years ago  Substance and Sexual Activity  . Alcohol use: No    Alcohol/week: 0.0 standard drinks  . Drug use: No  . Sexual activity: Not on file  Lifestyle  . Physical activity:    Days per week: Not on file    Minutes per session: Not on file  . Stress: Not on file  Relationships  . Social connections:    Talks on phone: Not on file    Gets together: Not on file    Attends religious service: Not on file    Active member of club or organization: Not on file    Attends meetings of clubs or organizations: Not on file    Relationship status: Not on file  . Intimate partner violence:    Fear of current or ex partner: No    Emotionally abused: No    Physically abused: No    Forced sexual activity: No  Other Topics Concern  . Not on file  Social History Narrative  . Not on file      PHYSICAL EXAMINATION:  Vital Signs:   Vitals:   01/27/18 1357  BP: 118/68  Pulse: 71  Resp: 18  Temp: 98 F (36.7 C)  SpO2: 98%   Filed Weights   01/27/18 1357  Weight: 165 lb 9.6 oz (75.1 kg)   General: Well-nourished, well-appearing female in no acute distress.  She is unaccompanied today.   HEENT: Head is normocephalic.  Pupils equal and reactive to light. Conjunctivae clear without exudate.  Sclerae anicteric. Oral mucosa is pink, moist.  Oropharynx is pink without lesions or erythema.  Lymph: No cervical, supraclavicular, or infraclavicular lymphadenopathy noted on palpation.  Cardiovascular: Regular rate and rhythm.Marland Kitchen Respiratory: Clear to auscultation bilaterally. Chest expansion symmetric; breathing non-labored.  Breasts: right breast s/p lumpectomy, no sign of local recurrence, left breast benign GI: Abdomen soft and round; non-tender, non-distended. Bowel sounds normoactive.  GU: Deferred.  Neuro: No focal  deficits. Steady gait.  Psych:  Mood and affect normal and appropriate for situation.  Extremities: No edema. MSK: No focal spinal tenderness to palpation.  Full range of motion in bilateral upper extremities Skin: Warm and dry.  LABORATORY DATA:  None for this visit.  DIAGNOSTIC IMAGING:  None for this visit.      ASSESSMENT AND PLAN:  Ms.. Perry is a pleasant 73 y.o. female with Stage IA right breast invasive ductal carcinoma, ER+/PR+/HER2-, diagnosed in 07/2017, treated with lumpectomy, adjuvant radiation therapy, and anti-estrogen therapy with Letrozole x 1 month (unable to tolerate).  She presents to the Survivorship Clinic for our initial meeting and routine follow-up post-completion of treatment for breast cancer.    1. Stage IA right breast cancer:  Cheryl Perry is continuing to recover from definitive treatment for breast cancer. She will follow-up with her medical oncologist, Dr. Lindi Adie 07/2018 with history and physical exam per surveillance protocol.  Today, a comprehensive survivorship care plan and treatment summary was reviewed with the patient today detailing her breast cancer diagnosis, treatment course, potential late/long-term effects of treatment, appropriate follow-up care with recommendations for the future, and patient education resources.  A copy of this summary, along with a letter will be sent to the patient's primary care provider via mail/fax/In Basket message after today's visit.    2. Elevated LFTs: labs improving.  No further Letrozole.  She will stay off of anti estrogen therapy and will re-address with Dr. Lindi Adie in 07/2018.  She also underwent CT chest at [facility] while undergoing evaluation of her elevated LFTs, and lung nodules were noted.  Repeat chest CT was recommended for 01/07/2019.    3. Bone health:  Given Cheryl Perry's age/history of breast cancer , she is at risk for bone demineralization.  She underwent bone density testing on 12/18/2017 that showed a T  score of -1.8 in the right femur, consistent with osteopenia.  She was given education on specific activities to promote bone health.  4. Cancer screening:  Due to Ms. Hurta's history and her age, she should receive screening for skin cancers, colon cancer, and gynecologic cancers.  The information and recommendations are listed on the patient's comprehensive care plan/treatment summary and were reviewed in detail with the patient.    5. Health maintenance and wellness promotion: Cheryl Perry was encouraged to consume 5-7 servings of fruits and vegetables per day. We reviewed the "Nutrition Rainbow" handout, as well as the handout "Take Control of Your Health and Reduce Your Cancer Risk" from the [employer].  She was also encouraged to engage in moderate to vigorous exercise for 30 minutes per day most days of the week. We discussed the [employer] fitness program, which is designed for cancer survivors to help them become more physically fit after cancer treatments.  She was instructed to limit her alcohol consumption and continue to abstain from tobacco use.     6. Support services/counseling: It is not uncommon for this period of the patient's cancer care trajectory to be one of many emotions and stressors.  We discussed an opportunity for her to participate in the next session of [facility] ("Finding Your New Normal") support group series designed for patients after they have completed treatment.   Cheryl Perry was encouraged to take advantage of our many other support services programs, support groups, and/or counseling in coping with her new life as a cancer survivor after completing anti-cancer treatment.  She was offered support today through active listening and expressive supportive counseling.  She was given  information regarding our available services and encouraged to contact me with any questions or for help enrolling in any of our support group/programs.    Dispo:   -Return to  cancer center for f/u with Dr. Lindi Adie in 07/2018 -Mammogram due in 06/2018 -Bone density due 12/2019 -Follow up with Dr. Brantley Stage in 03/2018 -Repeat Chest CT 12/2018 to follow lung nodule -She is welcome to return back to the Survivorship Clinic at any time; no additional follow-up needed at this time.  -Consider referral back to survivorship as a long-term survivor for continued surveillance  A total of (30) minutes of face-to-face time was spent with this patient with greater than 50% of that time in counseling and care-coordination.   Gardenia Phlegm, NP Survivorship Program [facility] [phone]   Note: PRIMARY CARE PROVIDER Sharion Balloon, [place] [phone]

## 2018-01-27 NOTE — Telephone Encounter (Signed)
Lab Results  Component Value Date   ALT 110 (H) 01/22/2018   AST 52 (H) 01/22/2018   ALKPHOS 145 (H) 01/22/2018   BILITOT 2.1 (H) 01/22/2018   Reviewed recent blood work completed through PCP. Markedly improved transaminases. Remains on steroid taper, which was started at [facility] out of concern for DRESS (drug reaction with eosinophilia and systemic symptoms). Dermatology had also evaluated patient and completed skin biopsy as well due to papular rash on abdomen and legs that developed after she had been seen in the office here. Etiology of offending agent is not entirely clear but [facility] feels possibly precipitated by Crestor and also had started on Letrozole at that time. She also took azithromycin and doxycycline in November. Crestor and letrozole discontinued. She has an upcoming appt in Feb 2020. Will arrange colonoscopy at that time.

## 2018-01-27 NOTE — Telephone Encounter (Signed)
Communication noted.  

## 2018-01-31 ENCOUNTER — Telehealth: Payer: Self-pay | Admitting: Internal Medicine

## 2018-01-31 NOTE — Telephone Encounter (Signed)
AB, can you advise if blood work needs to be done prior to appointment?

## 2018-01-31 NOTE — Telephone Encounter (Signed)
Pt has OV with [place] on 02/19/2018. Does she need to have labs done prior to appt? [phone]

## 2018-02-03 ENCOUNTER — Other Ambulatory Visit: Payer: Self-pay

## 2018-02-03 DIAGNOSIS — R945 Abnormal results of liver function studies: Principal | ICD-10-CM

## 2018-02-03 DIAGNOSIS — R7989 Other specified abnormal findings of blood chemistry: Secondary | ICD-10-CM

## 2018-02-03 NOTE — Telephone Encounter (Signed)
Yes, just needs HFP prior.

## 2018-02-03 NOTE — Telephone Encounter (Signed)
Noted. Orders placed and faxed to [facility] lab per pts request.

## 2018-02-04 DIAGNOSIS — N8111 Cystocele, midline: Secondary | ICD-10-CM | POA: Diagnosis not present

## 2018-02-04 DIAGNOSIS — N814 Uterovaginal prolapse, unspecified: Secondary | ICD-10-CM | POA: Diagnosis not present

## 2018-02-04 DIAGNOSIS — Z01419 Encounter for gynecological examination (general) (routine) without abnormal findings: Secondary | ICD-10-CM | POA: Diagnosis not present

## 2018-02-04 DIAGNOSIS — Z124 Encounter for screening for malignant neoplasm of cervix: Secondary | ICD-10-CM | POA: Diagnosis not present

## 2018-02-19 ENCOUNTER — Ambulatory Visit (INDEPENDENT_AMBULATORY_CARE_PROVIDER_SITE_OTHER): Payer: Medicare Other | Admitting: Gastroenterology

## 2018-02-19 ENCOUNTER — Other Ambulatory Visit (HOSPITAL_COMMUNITY)
Admission: RE | Admit: 2018-02-19 | Discharge: 2018-02-19 | Disposition: A | Discharge status: Home / Self Care | Payer: Medicare Other | Source: Ambulatory Visit | Attending: Gastroenterology | Admitting: Gastroenterology

## 2018-02-19 ENCOUNTER — Encounter: Payer: Self-pay | Admitting: Gastroenterology

## 2018-02-19 VITALS — BP 123/79 | HR 64 | Temp 97.0°F | Ht 66.0 in | Wt 166.6 lb

## 2018-02-19 DIAGNOSIS — R945 Abnormal results of liver function studies: Secondary | ICD-10-CM | POA: Diagnosis not present

## 2018-02-19 DIAGNOSIS — R7989 Other specified abnormal findings of blood chemistry: Secondary | ICD-10-CM

## 2018-02-19 LAB — HEPATIC FUNCTION PANEL
ALT: 32 U/L (ref 0–44)
AST: 27 U/L (ref 15–41)
Albumin: 3.5 g/dL (ref 3.5–5.0)
Alkaline Phosphatase: 58 U/L (ref 38–126)
Bilirubin, Direct: 0.3 mg/dL — ABNORMAL HIGH (ref 0.0–0.2)
Indirect Bilirubin: 0.6 mg/dL (ref 0.3–0.9)
TOTAL PROTEIN: 6 g/dL — AB (ref 6.5–8.1)
Total Bilirubin: 0.9 mg/dL (ref 0.3–1.2)

## 2018-02-19 NOTE — Patient Instructions (Signed)
I am glad you are doing well!  We will see you back in July!  I enjoyed seeing you again today! As you know, I value our relationship and want to provide genuine, compassionate, and quality care. I welcome your feedback. If you receive a survey regarding your visit,  I greatly appreciate you taking time to fill this out. See you next time!  Annitta Needs, PhD, ANP-BC [facility]

## 2018-02-19 NOTE — Progress Notes (Signed)
Referring Provider: Sharion Balloon, FNP Primary Care Physician:  Sharion Balloon, FNP Primary GI: Dr. Gala Romney   Chief Complaint  Patient presents with  . Hemorrhoids    very small amount of rectal bleeding but it is much better than before  . elevated LFTs    HPI:   Cheryl Perry is a 73 y.o. female presenting today with a history of drug-induced hepatitis, requiring admission to [facility] in Dec 2019. Steroid taper was started at [facility] out of concern for DRESS (drug reaction with eosinophilia and systemic symptoms). Dermatology had also evaluated patient and completed skin biopsy as well due to papular rash on abdomen and legs that developed after she had been seen in the office here. Etiology of offending agent is not entirely clear but [facility] feels possibly precipitated by Crestor and also had started on Letrozole at that time. She also took azithromycin and doxycycline in November. Crestor and letrozole discontinued.  Last colonoscopy was in 2015, she had diverticulosis and hemorrhoids. On previous colonoscopy she had adenomatous colon polyps therefore encouraged 5-year surveillance colonoscopies. On CT Dec 2019 during evaluation for markedly elevated LFTs, small punctate nodules at right lung base noted and non-specific. While at [facility], CT Chest without contrast with nonspecific right lower lobe nodules all less than 0.4 cm in diameter, recommending 1 year follow-up.   Presents today feeling much improved. She continues on steroid taper. Denies abdominal pain, jaundice, confusion, mental status changes, fatigue. Bowel movements are softer than normal. Noted small amount of rectal bleeding Saturday. Recent LFTs completed today with normalization of transaminases and alk phos. Discussed with patient at time of visit. She will need serial monitoring of this. Discussed holding off on surveillance colonoscopy due to recent acute hepatitis and hospitalization. Will pursue in the next few months.    Past Medical History:  Diagnosis Date  . Allergy   . Arthritis    back painherniated disc lumbar  . Asthma   . Back pain   . Cancer ([place]) 07/2017   right breast cancer, lumpectomy and 20 sessions of XRT  . Complication of anesthesia   . GERD (gastroesophageal reflux disease)   . Hiatal hernia   . Hyperlipidemia   . Hypothyroidism   . Osteopenia   . PONV (postoperative nausea and vomiting)    asks for scop patch  . Thyroid disease   . Vasculopathy    lymphatic    Past Surgical History:  Procedure Laterality Date  . BREAST LUMPECTOMY WITH RADIOACTIVE SEED AND SENTINEL LYMPH NODE BIOPSY Right 08/22/2017   Procedure: BREAST LUMPECTOMY WITH RADIOACTIVE SEED AND SENTINEL LYMPH NODE BIOPSY;  Surgeon: Erroll Luna, MD;  Location: [place];  Service: General;  Laterality: Right;  . COLONOSCOPY  2015   Dr. Earlean Shawl: Hemorrhoids, diverticulosis.  Next colonoscopy in 5 years due to previous history of adenomatous colon polyps  . CYSTOSCOPY    . cystscopy    . ESOPHAGOGASTRODUODENOSCOPY N/A 01/28/2015   Dr.Rourk- 3 small prepyloric/gastric ulcers- likely culprits causing bleeding. hiatal hernia, small gastic polyps not manipulated.  . ESOPHAGOGASTRODUODENOSCOPY N/A 05/26/2015   Dr. Gala Romney: Small hiatal hernia, previous gastric ulcers completely healed.  Marland Kitchen KNEE SURGERY    . TONSILLECTOMY    . TUBAL LIGATION      Current Outpatient Medications  Medication Sig Dispense Refill  . albuterol (PROVENTIL HFA) 108 (90 BASE) MCG/ACT inhaler Inhale 2 puffs into the lungs every 6 (six) hours as needed for wheezing. May use only 3  times a month    . Calcium Carbonate (CALCIUM 600 PO) Take 1,200 mg by mouth daily.     . Cholecalciferol (VITAMIN D) 2000 UNITS CAPS Take 2,000 Units by mouth daily.     Marland Kitchen EPINEPHrine 0.3 mg/0.3 mL IJ SOAJ injection Inject 0.3 mg into the muscle once.   0  . fluticasone-salmeterol (ADVAIR HFA) 45-21 MCG/ACT inhaler Inhale 2 puffs into the lungs 2 (two)  times daily. 3 Inhaler 3  . hydrocortisone (ANUSOL-HC) 2.5 % rectal cream as needed.     Marland Kitchen levothyroxine (SYNTHROID, LEVOTHROID) 75 MCG tablet TAKE 1 TABLET (75 MCG TOTAL)  BY MOUTH DAILY BEFORE BREAKFAST. 90 tablet 3  . loratadine (CLARITIN) 10 MG tablet Take 10 mg by mouth daily.    . methocarbamol (ROBAXIN) 750 MG tablet Take 375 mg by mouth every 6 (six) hours as needed for muscle spasms.   2  . methylPREDNISolone (MEDROL) 4 MG tablet     . montelukast (SINGULAIR) 10 MG tablet Take 10 mg by mouth at bedtime.    Marland Kitchen nystatin (MYCOSTATIN) 100000 UNIT/ML suspension SWISH AND SWALLOW 5 MLS 4 (FOUR) TIMES DAILY    . pantoprazole (PROTONIX) 40 MG tablet Take 1 tablet (40 mg total) by mouth daily before breakfast. 90 tablet 3  . triamcinolone ointment (KENALOG) 0.1 % Apply topically.    . B Complex Vitamins (VITAMIN B COMPLEX PO) Take 1 tablet by mouth daily.     No current facility-administered medications for this visit.     Allergies as of 02/19/2018 - Review Complete 02/19/2018  Allergen Reaction Noted  . Statins Other (See Comments) 01/04/2018  . Sulfa antibiotics Hives 01/04/2018  . Naproxen Hives and Itching 09/02/2013  . Lipitor [atorvastatin]  06/05/2012  . Premarin [conjugated estrogens] Itching 06/05/2012  . Ampicillin Rash 06/05/2012  . Avelox [moxifloxacin hcl in nacl] Rash 06/05/2012  . Azithromycin Rash 01/08/2018  . Bactrim [sulfamethoxazole-trimethoprim] Rash 06/05/2012  . Crestor [rosuvastatin calcium]  01/23/2018  . Doxycycline Rash 01/08/2018  . Levaquin [levofloxacin in d5w] Rash 06/05/2012  . Livalo [pitavastatin] Other (See Comments) 01/06/2016  . Macrodantin [nitrofurantoin macrocrystal] Hives and Rash 01/16/1985  . Trental [pentoxifylline er] Rash 06/05/2012    Family History  Problem Relation Age of Onset  . Stroke Mother   . Hypertension Mother   . Alzheimer's disease Mother   . Heart disease Sister   . Hypertension Sister   . Heart attack Sister         Psychologist, forensic   . Arthritis Daughter        very bad MVA   . Hypertension Son   . Seizures Son   . Colon cancer Neg Hx   . Liver cancer Neg Hx   . Pancreatic disease Neg Hx     Social History   Socioeconomic History  . Marital status: Married    Spouse name: Not on file  . Number of children: Not on file  . Years of education: Not on file  . Highest education level: Not on file  Occupational History  . Occupation: retired     Comment: HR work  / Astronomer   Social Needs  . Financial resource strain: Not on file  . Food insecurity:    Worry: Not on file    Inability: Not on file  . Transportation needs:    Medical: Not on file    Non-medical: Not on file  Tobacco Use  . Smoking status: Former Smoker    Last  attempt to quit: 06/06/1974    Years since quitting: 43.7  . Smokeless tobacco: Never Used  . Tobacco comment: Quit x 40 years ago  Substance and Sexual Activity  . Alcohol use: No    Alcohol/week: 0.0 standard drinks  . Drug use: No  . Sexual activity: Not on file  Lifestyle  . Physical activity:    Days per week: Not on file    Minutes per session: Not on file  . Stress: Not on file  Relationships  . Social connections:    Talks on phone: Not on file    Gets together: Not on file    Attends religious service: Not on file    Active member of club or organization: Not on file    Attends meetings of clubs or organizations: Not on file    Relationship status: Not on file  Other Topics Concern  . Not on file  Social History Narrative  . Not on file    Review of Systems: Gen: Denies fever, chills, anorexia. Denies fatigue, weakness, weight loss.  CV: Denies chest pain, palpitations, syncope, peripheral edema, and claudication. Resp: Denies dyspnea at rest, cough, wheezing, coughing up blood, and pleurisy. GI: see HPI Derm: Denies rash, itching, dry skin Psych: Denies depression, anxiety, memory loss, confusion. No homicidal or suicidal  ideation.  Heme: Denies bruising, bleeding, and enlarged lymph nodes.  Physical Exam: BP 123/79   Pulse 64   Temp (!) 97 F (36.1 C) (Oral)   Ht 5' 6"  (1.676 m)   Wt 166 lb 9.6 oz (75.6 kg)   BMI 26.89 kg/m  General:   Alert and oriented. No distress noted. Pleasant and cooperative.  Head:  Normocephalic and atraumatic. Eyes:  Conjuctiva clear without scleral icterus. Mouth:  Oral mucosa pink and moist.  Abdomen:  +BS, soft, non-tender and non-distended. No rebound or guarding. No HSM or masses noted. Msk:  Symmetrical without gross deformities. Normal posture. Extremities:  Without edema. Neurologic:  Alert and  oriented x4 Psych:  Alert and cooperative. Normal mood and affect.

## 2018-02-20 NOTE — Progress Notes (Signed)
Cheryl Perry, as discussed at the office visit, your liver numbers are now normalized!

## 2018-02-24 DIAGNOSIS — C50911 Malignant neoplasm of unspecified site of right female breast: Secondary | ICD-10-CM | POA: Diagnosis not present

## 2018-02-24 NOTE — Progress Notes (Signed)
CC'D TO PCP °

## 2018-02-24 NOTE — Assessment & Plan Note (Signed)
History of drug-induced hepatitis, requiring admission to [facility] in Dec 2019. Steroid taper was started at [facility] out of concern for DRESS (drug reaction with eosinophilia and systemic symptoms).  Etiology of offending agent is not entirely clear but felt possibly precipitated by Crestor and also had started on Letrozole at that time. She also took azithromycin and doxycycline in November. Crestor and letrozole discontinued. She has seen Oncology recently. Today, repeat HFP with normalized transaminases and alk phos. Will need to follow this closely. Continue with steroid taper as outlined by [facility]. Although she is due for a colonoscopy this year, will hold off on this until she completely recovers from recent acute illness. Will have her return in July to arrange this. Continue to follow HFP serially.

## 2018-03-20 DIAGNOSIS — J3081 Allergic rhinitis due to animal (cat) (dog) hair and dander: Secondary | ICD-10-CM | POA: Diagnosis not present

## 2018-03-20 DIAGNOSIS — J3089 Other allergic rhinitis: Secondary | ICD-10-CM | POA: Diagnosis not present

## 2018-03-20 DIAGNOSIS — J4540 Moderate persistent asthma, uncomplicated: Secondary | ICD-10-CM | POA: Diagnosis not present

## 2018-03-20 DIAGNOSIS — J301 Allergic rhinitis due to pollen: Secondary | ICD-10-CM | POA: Diagnosis not present

## 2018-04-01 ENCOUNTER — Encounter: Payer: Medicare Other | Admitting: *Deleted

## 2018-04-25 ENCOUNTER — Ambulatory Visit: Payer: Medicare Other | Admitting: Family

## 2018-04-30 ENCOUNTER — Other Ambulatory Visit: Payer: Self-pay

## 2018-05-01 ENCOUNTER — Ambulatory Visit (INDEPENDENT_AMBULATORY_CARE_PROVIDER_SITE_OTHER): Payer: Medicare Other | Admitting: Family

## 2018-05-01 ENCOUNTER — Encounter: Payer: Self-pay | Admitting: Family

## 2018-05-01 VITALS — BP 121/84 | HR 66 | Temp 98.0°F | Ht 66.0 in | Wt 168.6 lb

## 2018-05-01 DIAGNOSIS — J45909 Unspecified asthma, uncomplicated: Secondary | ICD-10-CM

## 2018-05-01 DIAGNOSIS — G8929 Other chronic pain: Secondary | ICD-10-CM | POA: Diagnosis not present

## 2018-05-01 DIAGNOSIS — Z170 Estrogen receptor positive status [ER+]: Secondary | ICD-10-CM

## 2018-05-01 DIAGNOSIS — E038 Other specified hypothyroidism: Secondary | ICD-10-CM

## 2018-05-01 DIAGNOSIS — M545 Low back pain, unspecified: Secondary | ICD-10-CM

## 2018-05-01 DIAGNOSIS — E663 Overweight: Secondary | ICD-10-CM

## 2018-05-01 DIAGNOSIS — C50411 Malignant neoplasm of upper-outer quadrant of right female breast: Secondary | ICD-10-CM | POA: Diagnosis not present

## 2018-05-01 DIAGNOSIS — K219 Gastro-esophageal reflux disease without esophagitis: Secondary | ICD-10-CM

## 2018-05-01 DIAGNOSIS — R945 Abnormal results of liver function studies: Secondary | ICD-10-CM

## 2018-05-01 DIAGNOSIS — R7989 Other specified abnormal findings of blood chemistry: Secondary | ICD-10-CM

## 2018-05-01 NOTE — Patient Instructions (Signed)
Hypothyroidism  Hypothyroidism is when the thyroid gland does not make enough of certain hormones (it is underactive). The thyroid gland is a small gland located in the lower front part of the neck, just in front of the windpipe (trachea). This gland makes hormones that help control how the body uses food for energy (metabolism) as well as how the heart and brain function. These hormones also play a role in keeping your bones strong. When the thyroid is underactive, it produces too little of the hormones thyroxine (T4) and triiodothyronine (T3). What are the causes? This condition may be caused by:  Hashimoto's disease. This is a disease in which the body's disease-fighting system (immune system) attacks the thyroid gland. This is the most common cause.  Viral infections.  Pregnancy.  Certain medicines.  Birth defects.  Past radiation treatments to the head or neck for cancer.  Past treatment with radioactive iodine.  Past exposure to radiation in the environment.  Past surgical removal of part or all of the thyroid.  Problems with a gland in the center of the brain (pituitary gland).  Lack of enough iodine in the diet. What increases the risk? You are more likely to develop this condition if:  You are female.  You have a family history of thyroid conditions.  You use a medicine called lithium.  You take medicines that affect the immune system (immunosuppressants). What are the signs or symptoms? Symptoms of this condition include:  Feeling as though you have no energy (lethargy).  Not being able to tolerate cold.  Weight gain that is not explained by a change in diet or exercise habits.  Lack of appetite.  Dry skin.  Coarse hair.  Menstrual irregularity.  Slowing of thought processes.  Constipation.  Sadness or depression. How is this diagnosed? This condition may be diagnosed based on:  Your symptoms, your medical history, and a physical exam.  Blood  tests. You may also have imaging tests, such as an ultrasound or MRI. How is this treated? This condition is treated with medicine that replaces the thyroid hormones that your body does not make. After you begin treatment, it may take several weeks for symptoms to go away. Follow these instructions at home:  Take over-the-counter and prescription medicines only as told by your health care provider.  If you start taking any new medicines, tell your health care provider.  Keep all follow-up visits as told by your health care provider. This is important. ? As your condition improves, your dosage of thyroid hormone medicine may change. ? You will need to have blood tests regularly so that your health care provider can monitor your condition. Contact a health care provider if:  Your symptoms do not get better with treatment.  You are taking thyroid replacement medicine and you: ? Sweat a lot. ? Have tremors. ? Feel anxious. ? Lose weight rapidly. ? Cannot tolerate heat. ? Have emotional swings. ? Have diarrhea. ? Feel weak. Get help right away if you have:  Chest pain.  An irregular heartbeat.  A rapid heartbeat.  Difficulty breathing. Summary  Hypothyroidism is when the thyroid gland does not make enough of certain hormones (it is underactive).  When the thyroid is underactive, it produces too little of the hormones thyroxine (T4) and triiodothyronine (T3).  The most common cause is Hashimoto's disease, a disease in which the body's disease-fighting system (immune system) attacks the thyroid gland. The condition can also be caused by viral infections, medicine, pregnancy, or past   radiation treatment to the head or neck.  Symptoms may include weight gain, dry skin, constipation, feeling as though you do not have energy, and not being able to tolerate cold.  This condition is treated with medicine to replace the thyroid hormones that your body does not make. This information  is not intended to replace advice given to you by your health care provider. Make sure you discuss any questions you have with your health care provider. Document Released: 12/25/2004 Document Revised: 12/05/2016 Document Reviewed: 12/05/2016 Elsevier Interactive Patient Education  2019 Elsevier Inc.  

## 2018-05-01 NOTE — Progress Notes (Signed)
Subjective:    Patient ID: Cheryl Perry, female    DOB: January 15, 1945, 73 y.o.   MRN: [id number]  Chief Complaint  Patient presents with  . Medical Management of Chronic Issues   Pt presents to the office today for chronic follow up.Pt had a acute GI bleed in January2017, and is been followed by [facility]. Has colonoscopy in January 2020.Pt is followed by allergen specialists annually for her asthma.PT is followed by Ortho as needed for chronic back pain.   She is followed by Oncologists every 6 months for right breast cancer. She completed radiation in 10/29/17.   She had drug-induced hepatitis possibly related to Crestor.  Asthma  She complains of cough and frequent throat clearing. There is no hemoptysis, hoarse voice or wheezing. This is a chronic problem. The current episode started more than 1 year ago. The problem occurs intermittently. The problem has been waxing and waning. Pertinent negatives include no heartburn. Her past medical history is significant for asthma.  Gastroesophageal Reflux  She complains of coughing. She reports no belching, no heartburn, no hoarse voice or no wheezing. This is a chronic problem. The current episode started more than 1 year ago. The problem occurs occasionally. The problem has been waxing and waning. Associated symptoms include fatigue. She has tried a PPI for the symptoms. The treatment provided moderate relief.  Thyroid Problem  Presents for follow-up visit. Symptoms include fatigue and hair loss. Patient reports no constipation, depressed mood, diaphoresis or hoarse voice. The symptoms have been stable.  Back Pain  This is a chronic problem. The current episode started more than 1 year ago. The problem occurs intermittently. The problem has been waxing and waning since onset. The pain is present in the lumbar spine. The quality of the pain is described as aching. The pain is at a severity of 5/10. The pain is mild.      Review of  Systems  Constitutional: Positive for fatigue. Negative for diaphoresis.  HENT: Negative for hoarse voice.   Respiratory: Positive for cough. Negative for hemoptysis and wheezing.   Gastrointestinal: Negative for constipation and heartburn.  Musculoskeletal: Positive for back pain.  All other systems reviewed and are negative.      Objective:   Physical Exam Vitals signs reviewed.  Constitutional:      General: She is not in acute distress.    Appearance: She is well-developed.  HENT:     Head: Normocephalic and atraumatic.     Right Ear: Tympanic membrane normal.     Left Ear: Tympanic membrane normal.  Eyes:     Pupils: Pupils are equal, round, and reactive to light.  Neck:     Musculoskeletal: Normal range of motion and neck supple.     Thyroid: No thyromegaly.  Cardiovascular:     Rate and Rhythm: Normal rate and regular rhythm.     Heart sounds: Normal heart sounds. No murmur.  Pulmonary:     Effort: Pulmonary effort is normal. No respiratory distress.     Breath sounds: Normal breath sounds. No wheezing.  Abdominal:     General: Bowel sounds are normal. There is no distension.     Palpations: Abdomen is soft.     Tenderness: There is no abdominal tenderness.  Musculoskeletal: Normal range of motion.        General: No tenderness.  Skin:    General: Skin is warm and dry.  Neurological:     Mental Status: She is alert and oriented to  person, place, and time.     Cranial Nerves: No cranial nerve deficit.     Deep Tendon Reflexes: Reflexes are normal and symmetric.  Psychiatric:        Behavior: Behavior normal.        Thought Content: Thought content normal.        Judgment: Judgment normal.       BP 121/84   Pulse 66   Temp 98 F (36.7 C) (Oral)   Ht 5' 6"  (1.676 m)   Wt 168 lb 9.6 oz (76.5 kg)   BMI 27.21 kg/m      Assessment & Plan:  Cheryl Perry comes in today with chief complaint of Medical Management of Chronic Issues   Diagnosis and  orders addressed:  1. Uncomplicated asthma, unspecified asthma severity, unspecified whether persistent - CMP14+EGFR - CBC with Differential/Platelet  2. Chronic bilateral low back pain without sciatica - CMP14+EGFR - CBC with Differential/Platelet  3. Elevated liver function tests - CMP14+EGFR - CBC with Differential/Platelet  4. Gastroesophageal reflux disease without esophagitis - CMP14+EGFR - CBC with Differential/Platelet  5. Other specified hypothyroidism - CMP14+EGFR - CBC with Differential/Platelet - TSH  6. Malignant neoplasm of upper-outer quadrant of right breast in female, estrogen receptor positive ([place]) - CMP14+EGFR - CBC with Differential/Platelet  7. Overweight (BMI 25.0-29.9) - CMP14+EGFR - CBC with Differential/Platelet   Labs pending Health Maintenance reviewed Diet and exercise encouraged  Follow up plan: 4 months    [name], FNP

## 2018-05-02 LAB — CMP14+EGFR
ALT: 25 IU/L (ref 0–32)
AST: 35 IU/L (ref 0–40)
Albumin/Globulin Ratio: 2.2 (ref 1.2–2.2)
Albumin: 4.2 g/dL (ref 3.7–4.7)
Alkaline Phosphatase: 83 IU/L (ref 39–117)
BUN/Creatinine Ratio: 19 (ref 12–28)
BUN: 15 mg/dL (ref 8–27)
Bilirubin Total: 0.4 mg/dL (ref 0.0–1.2)
CO2: 23 mmol/L (ref 20–29)
Calcium: 9.8 mg/dL (ref 8.7–10.3)
Chloride: 103 mmol/L (ref 96–106)
Creatinine, Ser: 0.77 mg/dL (ref 0.57–1.00)
GFR calc Af Amer: 89 mL/min/{1.73_m2} (ref >59)
GFR calc non Af Amer: 77 mL/min/{1.73_m2} (ref >59)
Globulin, Total: 1.9 g/dL (ref 1.5–4.5)
Glucose: 89 mg/dL (ref 65–99)
Potassium: 4.3 mmol/L (ref 3.5–5.2)
Sodium: 142 mmol/L (ref 134–144)
Total Protein: 6.1 g/dL (ref 6.0–8.5)

## 2018-05-02 LAB — CBC WITH DIFFERENTIAL/PLATELET
Basophils Absolute: 0.1 10*3/uL (ref 0.0–0.2)
Basos: 1 %
EOS (ABSOLUTE): 0.4 10*3/uL (ref 0.0–0.4)
Eos: 7 %
Hematocrit: 45.3 % (ref 34.0–46.6)
Hemoglobin: 15.3 g/dL (ref 11.1–15.9)
Immature Grans (Abs): 0 10*3/uL (ref 0.0–0.1)
Immature Granulocytes: 0 %
Lymphocytes Absolute: 1.3 10*3/uL (ref 0.7–3.1)
Lymphs: 21 %
MCH: 32.3 pg (ref 26.6–33.0)
MCHC: 33.8 g/dL (ref 31.5–35.7)
MCV: 96 fL (ref 79–97)
Monocytes Absolute: 0.7 10*3/uL (ref 0.1–0.9)
Monocytes: 11 %
Neutrophils Absolute: 3.6 10*3/uL (ref 1.4–7.0)
Neutrophils: 60 %
Platelets: 230 10*3/uL (ref 150–450)
RBC: 4.74 x10E6/uL (ref 3.77–5.28)
RDW: 11.7 % (ref 11.7–15.4)
WBC: 6 10*3/uL (ref 3.4–10.8)

## 2018-05-02 LAB — TSH: TSH: 0.992 u[IU]/mL (ref 0.450–4.500)

## 2018-05-29 ENCOUNTER — Telehealth: Payer: Self-pay | Admitting: Family

## 2018-06-12 ENCOUNTER — Other Ambulatory Visit: Payer: Self-pay

## 2018-06-12 ENCOUNTER — Ambulatory Visit (INDEPENDENT_AMBULATORY_CARE_PROVIDER_SITE_OTHER): Payer: Medicare Other | Admitting: *Deleted

## 2018-06-12 ENCOUNTER — Telehealth: Payer: Self-pay | Admitting: *Deleted

## 2018-06-12 ENCOUNTER — Encounter: Payer: Self-pay | Admitting: *Deleted

## 2018-06-12 DIAGNOSIS — Z0000 Encounter for general adult medical examination without abnormal findings: Secondary | ICD-10-CM

## 2018-06-12 NOTE — Patient Instructions (Signed)
  Ms. Cheryl Perry , Thank you for taking time to talk with me for your Medicare Wellness Visit. I appreciate your ongoing commitment to your health goals. Please review the following plan we discussed and let me know if I can assist you in the future.   These are the goals we discussed: Goals    . Increase physical activity     Stay active, travel - spend time with Grandchildren     . Weight < 160 lb (72.576 kg)       This is a list of the screening recommended for you and due dates:  Health Maintenance  Topic Date Due  . Colon Cancer Screening  01/26/2018  . Flu Shot  08/09/2018  . Mammogram  07/06/2019  . DEXA scan (bone density measurement)  12/19/2019  . Tetanus Vaccine  06/10/2023  .  Hepatitis C: One time screening is recommended by Center for Disease Control  (CDC) for  adults born from [birth year] through 1965.   Completed  . Pneumonia vaccines  Completed

## 2018-06-12 NOTE — Telephone Encounter (Signed)
Pt would like to re-schedule her procedure.  She had cancelled back in Jan (due to [facility] recommendations).  [phone]

## 2018-06-12 NOTE — Progress Notes (Addendum)
MEDICARE ANNUAL WELLNESS VISIT  06/12/2018  Telephone Visit Disclaimer This Medicare AWV was conducted by telephone due to national recommendations for restrictions regarding the COVID-19 Pandemic (e.g. social distancing).  I verified, using two identifiers, that I am speaking with Cheryl Perry or their authorized healthcare agent. I discussed the limitations, risks, security, and privacy concerns of performing an evaluation and management service by telephone and the potential availability of an in-person appointment in the future. The patient expressed understanding and agreed to proceed.   Subjective:  Cheryl Perry is a 73 y.o. female patient of Hawks, Theador Hawthorne, FNP who had a Medicare Annual Wellness Visit today via telephone. [name] is Retired and lives with their spouse. she has 2 children. she reports that she is socially active and does interact with friends/family regularly. she is moderately physically active and enjoys visiting with family and friends, exercising, and doing crossword puzzles.  Patient Care Team: Sharion Balloon, FNP as PCP - General (Family Medicine) Clent Jacks, MD as Consulting Physician (Ophthalmology) Gala Romney, Cristopher Estimable, MD as Consulting Physician (Gastroenterology) Suella Broad, MD as Consulting Physician (Physical Medicine and Rehabilitation) Iran Planas, MD as Consulting Physician (Orthopedic Surgery) Richmond Campbell, MD as Consulting Physician (Gastroenterology) Nicholas Lose, MD as Consulting Physician (Hematology and Oncology) Erroll Luna, MD as Consulting Physician (General Surgery) Kyung Rudd, MD as Consulting Physician (Radiation Oncology) Gardenia Phlegm, NP as Nurse Practitioner (Hematology and Oncology)  Advanced Directives 06/12/2018 01/02/2018 09/19/2017 08/22/2017 08/13/2017 07/17/2017 01/16/2017  Does Patient Have a Medical Advance Directive? Yes Yes Yes - No Yes Yes  Type of Advance Directive Living will;Healthcare Power  of [place];Living will Living will - - Living will Living will  Does patient want to make changes to medical advance directive? Yes (MAU/Ambulatory/Procedural Areas - Information given) - - - - No - Patient declined -  Copy of [place] in [place]  Would patient like information on creating a medical advance directive? - - - Yes (MAU/Ambulatory/Procedural Areas - Information given) Yes (MAU/Ambulatory/Procedural Areas - Information given) - -    Hospital Utilization Over the Past 12 Months: # of hospitalizations or ER visits: 0 ER visits, 1 hospital admission for elevated LFTs # of surgeries: 1 right breast lumpectomy  Review of Systems    Patient reports that her overall health is better compared to last year.   Review of Systems:   Musculoskeletal - positive for lower back pain  All other systems negative.  Pain Assessment Pain Score: 3      Current Medications & Allergies (verified) Allergies as of 06/12/2018      Reactions   Statins Other (See Comments)   Elevated LFT's   Sulfa Antibiotics Hives   Naproxen Hives, Itching   Note - also has Ulcer - cant take    Lipitor [atorvastatin]    mylagias   Premarin [conjugated Estrogens] Itching   tightness in chest   Ampicillin Rash   Avelox [moxifloxacin Hcl In Nacl] Rash   Azithromycin Rash   Concern for DRESS after useage   Bactrim [sulfamethoxazole-trimethoprim] Rash   Crestor [rosuvastatin Calcium]    Elevated liver results?   Doxycycline Rash   Concern for DRESS after usage.   Levaquin [levofloxacin In D5w] Rash   Livalo [pitavastatin] Other (See Comments)   myalgias   Macrodantin [nitrofurantoin Macrocrystal] Hives, Rash   Trental [pentoxifylline Er] Rash  Medication List       Accurate as of June 12, 2018  2:55 PM. If you have any questions, ask your nurse or doctor.        CALCIUM 600 PO Take 1,200 mg by mouth daily.   EPINEPHrine 0.3  mg/0.3 mL Soaj injection Commonly known as:  EPI-PEN Inject 0.3 mg into the muscle once.   fluticasone-salmeterol 45-21 MCG/ACT inhaler Commonly known as:  ADVAIR HFA Inhale 2 puffs into the lungs 2 (two) times daily.   hydrocortisone 2.5 % rectal cream Commonly known as:  ANUSOL-HC as needed.   levothyroxine 75 MCG tablet Commonly known as:  SYNTHROID TAKE 1 TABLET (75 MCG TOTAL)  BY MOUTH DAILY BEFORE BREAKFAST.   loratadine 10 MG tablet Commonly known as:  CLARITIN Take 10 mg by mouth daily.   methocarbamol 750 MG tablet Commonly known as:  ROBAXIN Take 375 mg by mouth every 6 (six) hours as needed for muscle spasms.   methylPREDNISolone 4 MG tablet Commonly known as:  MEDROL   montelukast 10 MG tablet Commonly known as:  SINGULAIR Take 10 mg by mouth at bedtime.   nystatin 100000 UNIT/ML suspension Commonly known as:  MYCOSTATIN SWISH AND SWALLOW 5 MLS 4 (FOUR) TIMES DAILY   pantoprazole 40 MG tablet Commonly known as:  PROTONIX Take 1 tablet (40 mg total) by mouth daily before breakfast.   Proventil HFA 108 (90 Base) MCG/ACT inhaler Generic drug:  albuterol Inhale 2 puffs into the lungs every 6 (six) hours as needed for wheezing. May use only 3 times a month   triamcinolone ointment 0.1 % Commonly known as:  KENALOG Apply topically.   VITAMIN B COMPLEX PO Take 1 tablet by mouth daily.   Vitamin D 50 MCG (2000 UT) Caps Take 2,000 Units by mouth daily.       History (reviewed): Past Medical History:  Diagnosis Date  . Allergy   . Arthritis    back painherniated disc lumbar  . Asthma   . Back pain   . Cancer ([place]) 07/2017   right breast cancer, lumpectomy and 20 sessions of XRT  . Complication of anesthesia   . GERD (gastroesophageal reflux disease)   . Hiatal hernia   . Hyperlipidemia   . Hypothyroidism   . Osteopenia   . PONV (postoperative nausea and vomiting)    asks for scop patch  . Thyroid disease   . Vasculopathy    lymphatic    Past Surgical History:  Procedure Laterality Date  . BREAST LUMPECTOMY WITH RADIOACTIVE SEED AND SENTINEL LYMPH NODE BIOPSY Right 08/22/2017   Procedure: BREAST LUMPECTOMY WITH RADIOACTIVE SEED AND SENTINEL LYMPH NODE BIOPSY;  Surgeon: Erroll Luna, MD;  Location: [facility];  Service: General;  Laterality: Right;  . COLONOSCOPY  2015   Dr. Earlean Shawl: Hemorrhoids, diverticulosis.  Next colonoscopy in 5 years due to previous history of adenomatous colon polyps  . CYSTOSCOPY    . cystscopy    . ESOPHAGOGASTRODUODENOSCOPY N/A 01/28/2015   Dr.Rourk- 3 small prepyloric/gastric ulcers- likely culprits causing bleeding. hiatal hernia, small gastic polyps not manipulated.  . ESOPHAGOGASTRODUODENOSCOPY N/A 05/26/2015   Dr. Gala Romney: Small hiatal hernia, previous gastric ulcers completely healed.  Marland Kitchen KNEE SURGERY    . TONSILLECTOMY    . TUBAL LIGATION     Family History  Problem Relation Age of Onset  . Stroke Mother   . Hypertension Mother   . Alzheimer's disease Mother   . Heart disease Sister   . Hypertension  Sister   . Heart attack Sister        Psychologist, forensic   . Arthritis Daughter        very bad MVA   . Hypertension Son   . Seizures Son   . Colon cancer Neg Hx   . Liver cancer Neg Hx   . Pancreatic disease Neg Hx    Social History   Socioeconomic History  . Marital status: Married    Spouse name: Not on file  . Number of children: 2  . Years of education: 16  . Highest education level: Bachelor's degree (e.g., BA, AB, BS)  Occupational History  . Occupation: retired     Comment: HR work  / Astronomer   Social Needs  . Financial resource strain: Not hard at all  . Food insecurity:    Worry: Never true    Inability: Never true  . Transportation needs:    Medical: No    Non-medical: No  Tobacco Use  . Smoking status: Former Smoker    Last attempt to quit: 06/06/1974    Years since quitting: 44.0  . Smokeless tobacco: Never Used  . Tobacco  comment: Quit x 40 years ago  Substance and Sexual Activity  . Alcohol use: No    Alcohol/week: 0.0 standard drinks  . Drug use: No  . Sexual activity: Not on file  Lifestyle  . Physical activity:    Days per week: 6 days    Minutes per session: 50 min  . Stress: Only a little  Relationships  . Social connections:    Talks on phone: More than three times a week    Gets together: More than three times a week    Attends religious service: More than 4 times per year    Active member of club or organization: Yes    Attends meetings of clubs or organizations: More than 4 times per year    Relationship status: Married  Other Topics Concern  . Not on file  Social History Narrative  . Not on file    Activities of Daily Living In your present state of health, do you have any difficulty performing the following activities: 06/12/2018 08/22/2017  Hearing? Y Y  Comment Tinnitis in right ear, has hearing aid for right ear decreased hearing Rt ear  Vision? N N  Difficulty concentrating or making decisions? N N  Walking or climbing stairs? N N  Dressing or bathing? N N  Doing errands, shopping? N -  Preparing Food and eating ? N -  Using the Toilet? N -  In the past six months, have you accidently leaked urine? Y -  Comment occasional urine leakage -  Do you have problems with loss of bowel control? N -  Managing your Medications? N -  Managing your Finances? N -  Housekeeping or managing your Housekeeping? N -  Some recent data might be hidden    Patient Literacy    Exercise Current Exercise Habits: Home exercise routine, Type of exercise: walking;strength training/weights;stretching, Time (Minutes): 45, Frequency (Times/Week): 6, Weekly Exercise (Minutes/Week): 270, Intensity: Moderate, Exercise limited by: orthopedic condition(s)  Diet Patient reports consuming 3 meals a day and 1 snack(s) a day Patient reports that her primary diet is: Low fat Patient reports that she does  have regular access to food.   Depression Screen PHQ 2/9 Scores 06/12/2018 05/01/2018 12/17/2017 11/02/2017 06/17/2017 01/21/2017 01/16/2017  PHQ - 2 Score 0 0 0 0 1 1 0  PHQ- 9 Score - - - - - - -     Fall Risk Fall Risk  06/12/2018 05/01/2018 12/17/2017 11/02/2017 06/17/2017  Falls in the past year? 0 0 0 No No  Follow up Falls prevention discussed - - - -     Objective:  Cheryl Perry seemed alert and oriented and she participated appropriately during our telephone visit.  Blood Pressure Weight BMI  BP Readings from Last 3 Encounters:  05/01/18 121/84  02/19/18 123/79  01/27/18 118/68   Wt Readings from Last 3 Encounters:  05/01/18 168 lb 9.6 oz (76.5 kg)  02/19/18 166 lb 9.6 oz (75.6 kg)  01/27/18 165 lb 9.6 oz (75.1 kg)   BMI Readings from Last 1 Encounters:  05/01/18 27.21 kg/m    *Unable to obtain current vital signs, weight, and BMI due to telephone visit type  Hearing/Vision  . Jocie did not seem to have difficulty with hearing/understanding during the telephone conversation . Reports that she has had a formal eye exam by an eye care professional within the past year . Reports that she has not had a formal hearing evaluation within the past year *Unable to fully assess hearing and vision during telephone visit type  Cognitive Function: 6CIT Screen 06/12/2018  What Year? 0 points  What month? 0 points  What time? 0 points  Count back from 20 0 points  Months in reverse 0 points  Repeat phrase 0 points  Total Score 0    Normal Cognitive Function Screening: Yes (Normal:0-7, Significant for Dysfunction: >8)  Immunization & Health Maintenance Record Immunization History  Administered Date(s) Administered  . Hepatitis A 04/03/2016  . Influenza, High Dose Seasonal PF 10/11/2015, 10/10/2016, 10/14/2017  . Influenza,inj,Quad PF,6+ Mos 10/18/2014  . Influenza-Unspecified 12/09/2012, 11/09/2013  . Pneumococcal Conjugate-13 12/09/2013  . Pneumococcal Polysaccharide-23  05/04/2011  . Tdap 06/09/2003, 06/09/2013  . Typhoid Live 04/03/2016  . Zoster 04/21/2010    Health Maintenance  Topic Date Due  . COLONOSCOPY  01/26/2018  . INFLUENZA VACCINE  08/09/2018  . MAMMOGRAM  07/06/2019  . DEXA SCAN  12/19/2019  . TETANUS/TDAP  06/10/2023  . Hepatitis C Screening  Completed  . PNA vac Low Risk Adult  Completed       Assessment  This is a routine wellness examination for Cheryl Perry.  Health Maintenance: Due or Overdue Health Maintenance Due  Topic Date Due  . COLONOSCOPY  01/26/2018   Colonoscopy had been scheduled for January but was postponed due to other health issues patient was experiencing.  Patient plans to reschedule colonoscopy as soon as possible. Patient is scheduled for mammogram in June 2020.  Cheryl Perry does not need a referral for Community Assistance: Care Management:   no Social Work:    no Prescription Assistance:  no Nutrition/Diabetes Education:  no   Plan:  Personalized Goals Goals Addressed            This Visit's Progress   . Increase physical activity   On track    Stay active, travel - spend time with [place] Maintenance & Screening Recommendations  Screening mammography Colorectal cancer screening Advanced directives: has an advanced directive - a copy has been provided- sent information patient would like to update Advanced Directives. Recommend shingrix vaccine in the future.  Lung Cancer Screening Recommended: no (Low Dose CT Chest recommended if Age 66-80 years, 30 pack-year currently smoking OR have quit w/in past 15 years) Hepatitis  C Screening recommended: completed 12/26/17   Advanced Directives: Written information was prepared per patient's request.  Referrals & Orders N/A   Follow-up Plan . Follow-up with Sharion Balloon, FNP as planned . Reschedule colonoscopy . Have mammogram as scheduled in June. . Continue to work on your goal of staying  active.   I have personally reviewed and noted the following in the patient's chart:   . Medical and social history . Use of alcohol, tobacco or illicit drugs  . Current medications and supplements . Functional ability and status . Nutritional status . Physical activity . Advanced directives . List of other physicians . Hospitalizations, surgeries, and ER visits in previous 12 months . Vitals . Screenings to include cognitive, depression, and falls . Referrals and appointments  In addition, I have reviewed and discussed with Cheryl Perry certain preventive protocols, quality metrics, and best practice recommendations. A written personalized care plan for preventive services as well as general preventive health recommendations is available and can be mailed to the patient at her request.      WYATT, AMY M  06/12/2018    I have reviewed and agree with the above AWV documentation.   [name], FNP

## 2018-06-13 ENCOUNTER — Encounter: Payer: Self-pay | Admitting: Internal Medicine

## 2018-06-13 NOTE — Telephone Encounter (Signed)
Scheduled patient and mailed letter

## 2018-06-13 NOTE — Telephone Encounter (Signed)
Patient will need OV to r/s. Please schedule OV stacey thanks

## 2018-06-16 ENCOUNTER — Other Ambulatory Visit: Payer: Self-pay

## 2018-06-16 NOTE — Telephone Encounter (Signed)
Pt called office, she requests refill for 90 day supply of Protonix be sent to [facility].

## 2018-06-17 ENCOUNTER — Ambulatory Visit: Payer: Medicare Other | Admitting: Family

## 2018-06-17 MED ORDER — PANTOPRAZOLE SODIUM 40 MG PO TBEC: 40.0000 mg | DELAYED_RELEASE_TABLET | Freq: Every day | ORAL | 3 refills | Status: DC

## 2018-06-17 NOTE — Telephone Encounter (Signed)
Completed.

## 2018-07-03 DIAGNOSIS — Z853 Personal history of malignant neoplasm of breast: Secondary | ICD-10-CM | POA: Diagnosis not present

## 2018-07-08 ENCOUNTER — Telehealth: Payer: Self-pay | Admitting: Hematology and Oncology

## 2018-07-08 NOTE — Assessment & Plan Note (Signed)
08/22/2017:Right lumpectomy: IDC grade 1, 1.3 cm, margins negative, negative for lymphovascular or perineural invasion, 0/2 lymph nodes negative, ER 100%, PR 100%, Ki-67 2%, HER-2 negative ratio 1.29 T1CN0 stage Ia Oncotype DX recurrence score 0: Distant recurrence of 9 years: 3% Adjuvant radiation therapy 09/26/2017 to 10/22/2017  Treatment plan: Adjuvant antiestrogen therapywith letrozole 2.5 mg daily started 10/08/2017 discontinued December 2019 due to hepatic toxicity  Letrozole toxicities: Severely increased LFTs into the thousands since letrozole was started.  It was discontinued in December 2019.  Breast cancer surveillance: 1.  Breast exam 01/14/2018: Benign 2.  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 11/03/2017: Doxycycline was prescribed for UTI 11/09/2017: Steroid Dosepak and Z-Pak prescribed for URI 11/11/2017: Letrozole was started 12/13/2017: Urine turned dark and back pain and blood work showed elevated AST ALT CT abdomen and pelvis performed for elevated LFTs: 12/25/2017: Gallbladder wall thickening, edema porta hepatis region small punctate nodules right lung base nonspecific  Liver biopsy random colon moderate to severe acute/subacute hepatitis with cholestasis consistent with drug or toxin induced liver injury Admitted at [facility] and treated with high-dose steroids  01/08/2018: AST ALT reduced significantly enough to be discharged home.  Steroids will continue until March 2020  Since it is not 789% certain that the autoimmune hepatitis was related to letrozole, the recommendation is to discontinue letrozole.  No further aromatase inhibitor therapy will be planned. We will continue surveillance with annual checkups and mammograms.

## 2018-07-08 NOTE — Telephone Encounter (Signed)
I talk with patient regarding video visit °

## 2018-07-14 ENCOUNTER — Telehealth: Payer: Self-pay | Admitting: Hematology and Oncology

## 2018-07-14 NOTE — Progress Notes (Signed)
HEMATOLOGY-ONCOLOGY DOXIMITY VISIT PROGRESS NOTE  I connected with Cheryl Perry on 07/15/2018 at  9:00 AM EDT by Doximity video conference and verified that I am speaking with the correct person using two identifiers.  I discussed the limitations, risks, security and privacy concerns of performing an evaluation and management service by Doximity and the availability of in person appointments.  I also discussed with the patient that there may be a patient responsible charge related to this service. The patient expressed understanding and agreed to proceed.  Patient's Location: Home Physician Location: Clinic  CHIEF COMPLIANT: Surveillance of right breast cancer and drug-induced hepatitis  INTERVAL HISTORY: Cheryl Perry is a 73 y.o. female with above-mentioned history of right breast cancer treated with lumpectomy and radiation who was on anti-estrogen therapy with letrozole and had severe drug-induced hepatitis. She is currently on surveillance. She presents to the clinic today for 20-monthfollow-up to consider starting tamoxifen.  She doesn't want to take it either. Arthritis is worse and a herniated disk.  Oncology History  Malignant neoplasm of upper-outer quadrant of right breast in female, estrogen receptor positive ([place]  07/08/2017 Initial Diagnosis   Screening detected right breast mass 1.2 cm at 10 o'clock position 6 cm from the nipple axilla negative: Biopsy revealed invasive ductal carcinoma grade 1-2 with a low-grade DCIS, ER 100%, PR 100%, Ki-67 2%, HER-2 negative ratio 1.29, T1CN0 stage Ia AJCC 8   08/22/2017 Surgery   Right lumpectomy: IDC grade 1, 1.3 cm, margins negative, negative for lymphovascular or perineural invasion, 0/2 lymph nodes negative, ER 100%, PR 100%, Ki-67 2%, HER-2 negative ratio 1.29 T1CN0 stage Ia   09/06/2017 Oncotype testing   Oncotype DX recurrence score 0: Distant recurrence risk at 9 years is 3%   09/11/2017 Cancer Staging   Staging form: Breast,  AJCC 8th Edition - Pathologic: Stage IA (pT1c, pN0, cM0, G1, ER+, PR+, HER2-, Oncotype DX score: 0) - Signed by CGardenia Phlegm NP on 09/11/2017   09/26/2017 - 10/22/2017 Radiation Therapy   Adjuvant radiation therapy   10/08/2017 - 12/2017 Anti-estrogen oral therapy   Letrozole, stopped due to severely elevated LFTs     REVIEW OF SYSTEMS:   Constitutional: Denies fevers, chills or abnormal weight loss Eyes: Denies blurriness of vision Ears, nose, mouth, throat, and face: Denies mucositis or sore throat Respiratory: Denies cough, dyspnea or wheezes Cardiovascular: Denies palpitation, chest discomfort Gastrointestinal:  Denies nausea, heartburn or change in bowel habits Skin: Denies abnormal skin rashes Lymphatics: Denies new lymphadenopathy or easy bruising Neurological:Denies numbness, tingling or new weaknesses Behavioral/Psych: Mood is stable, no new changes  Extremities: No lower extremity edema, arthritis is worse Breast: denies any pain or lumps or nodules in either breasts All other systems were reviewed with the patient and are negative.  Observations/Objective:  There were no vitals filed for this visit. There is no height or weight on file to calculate BMI.  I have reviewed the data as listed CMP Latest Ref Rng & Units 05/01/2018 02/19/2018 01/22/2018  Glucose 65 - 99 mg/dL 89 - 67  BUN 8 - 27 mg/dL 15 - 13  Creatinine 0.57 - 1.00 mg/dL 0.77 - 0.69  Sodium 134 - 144 mmol/L 142 - 141  Potassium 3.5 - 5.2 mmol/L 4.3 - 3.9  Chloride 96 - 106 mmol/L 103 - 102  CO2 20 - 29 mmol/L 23 - 25  Calcium 8.7 - 10.3 mg/dL 9.8 - 9.3  Total Protein 6.0 - 8.5 g/dL 6.1 6.0(L) 5.8(L)  Total Bilirubin 0.0 - 1.2 mg/dL 0.4 0.9 2.1(H)  Alkaline Phos 39 - 117 IU/L 83 58 145(H)  AST 0 - 40 IU/L 35 27 52(H)  ALT 0 - 32 IU/L 25 32 110(H)    Lab Results  Component Value Date   WBC 6.0 05/01/2018   HGB 15.3 05/01/2018   HCT 45.3 05/01/2018   MCV 96 05/01/2018   PLT 230 05/01/2018    NEUTROABS 3.6 05/01/2018      Assessment Plan:  Malignant neoplasm of upper-outer quadrant of right breast in female, estrogen receptor positive ([place]) 08/22/2017:Right lumpectomy: IDC grade 1, 1.3 cm, margins negative, negative for lymphovascular or perineural invasion, 0/2 lymph nodes negative, ER 100%, PR 100%, Ki-67 2%, HER-2 negative ratio 1.29 T1CN0 stage Ia Oncotype DX recurrence score 0: Distant recurrence of 9 years: 3% Adjuvant radiation therapy 09/26/2017 to 10/22/2017  Treatment plan: Adjuvant antiestrogen therapywith letrozole 2.5 mg daily started 10/08/2017 discontinued December 2019 due to hepatic toxicity  Letrozole toxicities: Severely increased LFTs into the thousands since letrozole was started.  It was discontinued in December 2019.  Breast cancer surveillance: 1.  Breast exam 01/14/2018: Benign 2.  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 11/03/2017: Doxycycline was prescribed for UTI 11/09/2017: Steroid Dosepak and Z-Pak prescribed for URI 11/11/2017: Letrozole was started 12/13/2017: Urine turned dark and back pain and blood work showed elevated AST ALT CT abdomen and pelvis performed for elevated LFTs: 12/25/2017: Gallbladder wall thickening, edema porta hepatis region small punctate nodules right lung base nonspecific  Liver biopsy random colon moderate to severe acute/subacute hepatitis with cholestasis consistent with drug or toxin induced liver injury Admitted at [facility] and treated with high-dose steroids  01/08/2018: AST ALT reduced significantly enough to be discharged home.  Steroids continued until March 2020  Since it is not 242% certain that the autoimmune hepatitis was related to letrozole, the recommendation is to discontinue letrozole. No further aromatase inhibitor therapy will be planned. We will continue surveillance with annual checkups and mammograms.    I discussed the assessment  and treatment plan with the patient. The patient was provided an opportunity to ask questions and all were answered. The patient agreed with the plan and demonstrated an understanding of the instructions. The patient was advised to call back or seek an in-person evaluation if the symptoms worsen or if the condition fails to improve as anticipated.   I provided 15 minutes of face-to-face Doximity time during this encounter.    Rulon Eisenmenger, MD 07/15/2018   I, Molly Dorshimer, am acting as scribe for Nicholas Lose, MD.  I have reviewed the above documentation for accuracy and completeness, and I agree with the above.

## 2018-07-14 NOTE — Telephone Encounter (Signed)
Contacted pt to verify webex visit for pre reg

## 2018-07-15 ENCOUNTER — Other Ambulatory Visit: Payer: Medicare Other

## 2018-07-15 ENCOUNTER — Inpatient Hospital Stay: Payer: Medicare Other | Attending: Hematology and Oncology | Admitting: Hematology and Oncology

## 2018-07-15 DIAGNOSIS — C50411 Malignant neoplasm of upper-outer quadrant of right female breast: Secondary | ICD-10-CM

## 2018-07-15 DIAGNOSIS — Z79811 Long term (current) use of aromatase inhibitors: Secondary | ICD-10-CM | POA: Diagnosis not present

## 2018-07-15 DIAGNOSIS — Z923 Personal history of irradiation: Secondary | ICD-10-CM

## 2018-07-15 DIAGNOSIS — Z170 Estrogen receptor positive status [ER+]: Secondary | ICD-10-CM

## 2018-07-17 DIAGNOSIS — M5136 Other intervertebral disc degeneration, lumbar region: Secondary | ICD-10-CM | POA: Diagnosis not present

## 2018-07-31 DIAGNOSIS — M5416 Radiculopathy, lumbar region: Secondary | ICD-10-CM | POA: Diagnosis not present

## 2018-07-31 DIAGNOSIS — M545 Low back pain: Secondary | ICD-10-CM | POA: Diagnosis not present

## 2018-09-01 ENCOUNTER — Other Ambulatory Visit: Payer: Self-pay

## 2018-09-02 ENCOUNTER — Ambulatory Visit (INDEPENDENT_AMBULATORY_CARE_PROVIDER_SITE_OTHER): Payer: Medicare Other | Admitting: Family

## 2018-09-02 ENCOUNTER — Encounter: Payer: Self-pay | Admitting: Family

## 2018-09-02 VITALS — BP 129/90 | HR 60 | Temp 97.1°F | Ht 66.0 in | Wt 167.2 lb

## 2018-09-02 DIAGNOSIS — R6889 Other general symptoms and signs: Secondary | ICD-10-CM | POA: Diagnosis not present

## 2018-09-02 DIAGNOSIS — E663 Overweight: Secondary | ICD-10-CM | POA: Diagnosis not present

## 2018-09-02 DIAGNOSIS — K259 Gastric ulcer, unspecified as acute or chronic, without hemorrhage or perforation: Secondary | ICD-10-CM | POA: Diagnosis not present

## 2018-09-02 DIAGNOSIS — M545 Low back pain, unspecified: Secondary | ICD-10-CM

## 2018-09-02 DIAGNOSIS — Z8719 Personal history of other diseases of the digestive system: Secondary | ICD-10-CM

## 2018-09-02 DIAGNOSIS — K219 Gastro-esophageal reflux disease without esophagitis: Secondary | ICD-10-CM

## 2018-09-02 DIAGNOSIS — J45909 Unspecified asthma, uncomplicated: Secondary | ICD-10-CM

## 2018-09-02 DIAGNOSIS — E785 Hyperlipidemia, unspecified: Secondary | ICD-10-CM

## 2018-09-02 DIAGNOSIS — G8929 Other chronic pain: Secondary | ICD-10-CM

## 2018-09-02 DIAGNOSIS — C50411 Malignant neoplasm of upper-outer quadrant of right female breast: Secondary | ICD-10-CM | POA: Diagnosis not present

## 2018-09-02 DIAGNOSIS — Z170 Estrogen receptor positive status [ER+]: Secondary | ICD-10-CM

## 2018-09-02 DIAGNOSIS — E038 Other specified hypothyroidism: Secondary | ICD-10-CM

## 2018-09-02 NOTE — Patient Instructions (Signed)

## 2018-09-02 NOTE — Progress Notes (Signed)
Subjective:    Patient ID: Cheryl Perry, female    DOB: 12-05-1945, 73 y.o.   MRN: [id number]  Chief Complaint  Patient presents with   Medical Management of Chronic Issues   Pt presents to the office today for chronic follow up.Pt had a acute GI bleed in January2017, and is been followed by [facility].Pt is followed by allergen specialists annually for her asthma.PT is followed by Ortho as needed for chronic back pain.  She is followed by Oncologists every 6 months for right breast cancer. She completed radiation in 10/29/17.   She had drug-induced hepatitis possibly related to Crestor. Asthma She complains of hoarse voice. There is no cough or wheezing. This is a chronic problem. The current episode started more than 1 year ago. The problem occurs intermittently. The problem has been resolved. Associated symptoms include heartburn and malaise/fatigue. Pertinent negatives include no chest pain, ear pain, fever, sweats or trouble swallowing. Her symptoms are alleviated by OTC inhaler and rest. She reports significant improvement on treatment. Her past medical history is significant for asthma.  Gastroesophageal Reflux She complains of belching, heartburn and a hoarse voice. She reports no chest pain, no coughing or no wheezing. This is a chronic problem. The current episode started more than 1 year ago. The problem occurs occasionally. The problem has been waxing and waning. Associated symptoms include fatigue. She has tried a PPI for the symptoms. The treatment provided moderate relief.  Thyroid Problem Presents for follow-up visit. Symptoms include dry skin, fatigue, hair loss and hoarse voice. Patient reports no constipation, depressed mood, diaphoresis or diarrhea. The symptoms have been stable. Her past medical history is significant for hyperlipidemia.  Back Pain This is a chronic problem. The current episode started more than 1 year ago. The problem occurs intermittently.  The problem has been waxing and waning since onset. The pain is present in the lumbar spine. The quality of the pain is described as aching. The pain does not radiate. The pain is at a severity of 2/10. The pain is moderate. Pertinent negatives include no chest pain or fever. Risk factors include obesity and menopause. She has tried muscle relaxant for the symptoms. The treatment provided moderate relief.  Hyperlipidemia This is a chronic problem. The current episode started more than 1 year ago. The problem is uncontrolled. Recent lipid tests were reviewed and are high. Exacerbating diseases include obesity. Pertinent negatives include no chest pain. Current antihyperlipidemic treatment includes diet change. The current treatment provides mild improvement of lipids. Risk factors for coronary artery disease include dyslipidemia, hypertension, a sedentary lifestyle and obesity.      Review of Systems  Constitutional: Positive for fatigue and malaise/fatigue. Negative for diaphoresis and fever.  HENT: Positive for hoarse voice. Negative for ear pain and trouble swallowing.   Respiratory: Negative for cough and wheezing.   Cardiovascular: Negative for chest pain.  Gastrointestinal: Positive for heartburn. Negative for constipation and diarrhea.  Musculoskeletal: Positive for back pain.  All other systems reviewed and are negative.      Objective:   Physical Exam Vitals signs reviewed.  Constitutional:      General: She is not in acute distress.    Appearance: She is well-developed.  HENT:     Head: Normocephalic and atraumatic.     Right Ear: Tympanic membrane normal.     Left Ear: Tympanic membrane normal.  Eyes:     Pupils: Pupils are equal, round, and reactive to light.  Neck:  Musculoskeletal: Normal range of motion and neck supple.     Thyroid: No thyromegaly.  Cardiovascular:     Rate and Rhythm: Normal rate and regular rhythm.     Heart sounds: Normal heart sounds. No  murmur.  Pulmonary:     Effort: Pulmonary effort is normal. No respiratory distress.     Breath sounds: Normal breath sounds. No wheezing.  Abdominal:     General: Bowel sounds are normal. There is no distension.     Palpations: Abdomen is soft.     Tenderness: There is no abdominal tenderness.  Musculoskeletal: Normal range of motion.        General: No tenderness.  Skin:    General: Skin is warm and dry.  Neurological:     Mental Status: She is alert and oriented to person, place, and time.     Cranial Nerves: No cranial nerve deficit.     Deep Tendon Reflexes: Reflexes are normal and symmetric.  Psychiatric:        Behavior: Behavior normal.        Thought Content: Thought content normal.        Judgment: Judgment normal.      BP 129/90    Pulse 60    Temp (!) 97.1 F (36.2 C) (Oral)    Ht 5' 6"  (1.676 m)    Wt 167 lb 3.2 oz (75.8 kg)    BMI 26.99 kg/m      Assessment & Plan:  Cheryl Perry comes in today with chief complaint of Medical Management of Chronic Issues   Diagnosis and orders addressed:  1. Uncomplicated asthma, unspecified asthma severity, unspecified whether persistent - CMP14+EGFR - Anemia Profile B  2. Gastroesophageal reflux disease without esophagitis - CMP14+EGFR - Anemia Profile B  3. Other specified hypothyroidism - CMP14+EGFR - Anemia Profile B - TSH  4. Hyperlipidemia, unspecified hyperlipidemia type - CMP14+EGFR - Anemia Profile B  5. Malignant neoplasm of upper-outer quadrant of right breast in female, estrogen receptor positive ([place]) - CMP14+EGFR - Anemia Profile B  6. Overweight (BMI 25.0-29.9) - CMP14+EGFR - Anemia Profile B  7. History of lower GI bleeding - CMP14+EGFR - Anemia Profile B  8. Chronic bilateral low back pain without sciatica - CMP14+EGFR - Anemia Profile B   Labs pending Health Maintenance reviewed Diet and exercise encouraged  Follow up plan: 6 months    [name], FNP

## 2018-09-03 ENCOUNTER — Telehealth: Payer: Self-pay | Admitting: Internal Medicine

## 2018-09-03 DIAGNOSIS — R7989 Other specified abnormal findings of blood chemistry: Secondary | ICD-10-CM

## 2018-09-03 LAB — CMP14+EGFR
ALT: 808 IU/L (ref 0–32)
AST: 659 IU/L (ref 0–40)
Albumin/Globulin Ratio: 2 (ref 1.2–2.2)
Albumin: 4.1 g/dL (ref 3.7–4.7)
Alkaline Phosphatase: 201 IU/L — ABNORMAL HIGH (ref 39–117)
BUN/Creatinine Ratio: 12 (ref 12–28)
BUN: 8 mg/dL (ref 8–27)
Bilirubin Total: 0.9 mg/dL (ref 0.0–1.2)
CO2: 22 mmol/L (ref 20–29)
Calcium: 9.7 mg/dL (ref 8.7–10.3)
Chloride: 106 mmol/L (ref 96–106)
Creatinine, Ser: 0.67 mg/dL (ref 0.57–1.00)
GFR calc Af Amer: 101 mL/min/{1.73_m2} (ref >59)
GFR calc non Af Amer: 87 mL/min/{1.73_m2} (ref >59)
Globulin, Total: 2.1 g/dL (ref 1.5–4.5)
Glucose: 94 mg/dL (ref 65–99)
Potassium: 4.4 mmol/L (ref 3.5–5.2)
Sodium: 144 mmol/L (ref 134–144)
Total Protein: 6.2 g/dL (ref 6.0–8.5)

## 2018-09-03 LAB — ANEMIA PROFILE B
Basophils Absolute: 0.1 10*3/uL (ref 0.0–0.2)
Basos: 2 %
EOS (ABSOLUTE): 0.7 10*3/uL — ABNORMAL HIGH (ref 0.0–0.4)
Eos: 10 %
Ferritin: 211 ng/mL — ABNORMAL HIGH (ref 15–150)
Folate: >20 ng/mL (ref >3.0)
Hematocrit: 46.1 % (ref 34.0–46.6)
Hemoglobin: 15.5 g/dL (ref 11.1–15.9)
Immature Grans (Abs): 0.1 10*3/uL (ref 0.0–0.1)
Immature Granulocytes: 1 %
Iron Saturation: 45 % (ref 15–55)
Iron: 170 ug/dL — ABNORMAL HIGH (ref 27–139)
Lymphocytes Absolute: 1.4 10*3/uL (ref 0.7–3.1)
Lymphs: 21 %
MCH: 31.8 pg (ref 26.6–33.0)
MCHC: 33.6 g/dL (ref 31.5–35.7)
MCV: 95 fL (ref 79–97)
Monocytes Absolute: 0.7 10*3/uL (ref 0.1–0.9)
Monocytes: 10 %
Neutrophils Absolute: 3.7 10*3/uL (ref 1.4–7.0)
Neutrophils: 56 %
Platelets: 212 10*3/uL (ref 150–450)
RBC: 4.87 x10E6/uL (ref 3.77–5.28)
RDW: 13.2 % (ref 11.7–15.4)
Retic Ct Pct: 1.3 % (ref 0.6–2.6)
Total Iron Binding Capacity: 380 ug/dL (ref 250–450)
UIBC: 210 ug/dL (ref 118–369)
Vitamin B-12: 1720 pg/mL — ABNORMAL HIGH (ref 232–1245)
WBC: 6.7 10*3/uL (ref 3.4–10.8)

## 2018-09-03 LAB — TSH: TSH: 1.98 u[IU]/mL (ref 0.450–4.500)

## 2018-09-03 NOTE — Telephone Encounter (Signed)
Please contact patient: ask if she has any fever, rash, facial swelling, enlarged lymph nodes, yellowing of eyes or skin.  She was inpatient at [facility] Dec 2019 due to what was felt to be DRESS syndrome. Felt precipitated by Crestor, possibly Letrozole, azithromycin, or doxycycline.   Any new medications or recent medications?

## 2018-09-03 NOTE — Telephone Encounter (Signed)
Spoke with pt. She had her 6 month f/u with Cheryl Dun FNP yesterday 09/02/2018. Pt's liver enzymes were elevated and pt was asked to call our office. Pt is scheduled for a f/u with [name] on Monday 09/08/2018.

## 2018-09-03 NOTE — Telephone Encounter (Signed)
I spoke with patient. No jaundice. No confusion. No rash. Afebrile. No lymphadenopathy. Stopped the B vitamin (super B). I have asked her to stop all her meds except synthroid. I am reaching out to [facility], 8/27. Will need to follow labs and clinical condition. She is well versed on signs/symptoms that would warrant immediate presentation to the ED.

## 2018-09-03 NOTE — Telephone Encounter (Signed)
Pt isn't having any of the below symptoms. Pt started a vitamin 3 months ago. There has been no prescription medication changes.  Pt has felt more tired. Pt has been taking vitamin B Complex with B12, biotin for 3 months. Pt is going to stop taking it because she noticed her B12 level was elevated.

## 2018-09-03 NOTE — Telephone Encounter (Signed)
Pt is concerned about her recent labs and said her liver enzymes were high. She has OV on Monday and wanted to be seen sooner or have the provider take a look at her results. Please advise [phone]

## 2018-09-04 ENCOUNTER — Other Ambulatory Visit: Payer: Self-pay

## 2018-09-04 DIAGNOSIS — R945 Abnormal results of liver function studies: Secondary | ICD-10-CM | POA: Diagnosis not present

## 2018-09-04 DIAGNOSIS — R7989 Other specified abnormal findings of blood chemistry: Secondary | ICD-10-CM

## 2018-09-04 NOTE — Addendum Note (Signed)
Addended by: Annitta Needs on: 09/04/2018 09:36 AM   Modules accepted: Orders

## 2018-09-04 NOTE — Telephone Encounter (Signed)
Noted. Labs released.

## 2018-09-04 NOTE — Telephone Encounter (Signed)
Reviewed with Dr. Gala Romney. I will be seeing patient tomorrow at 10am. We are updating CBC, HFP, AMA, ANA, ASMA, immunoglobulins, and INR. Patient is stable without confusion, mental status changes, fever, rash, jaundice. I have also placed a call to [facility], Dr. Herschel Senegal who saw patient in Jan 2020 in follow-up after hospitalization. Anticipate may need to start oral steroids again. I am awaiting a call back and have left my personal number as well.  Patient is well versed on signs/symptoms that would necessitate urgent evaluation. She is to bring all medications with her tomorrow.   Alicia: please release labs as she is going today. Thank you!

## 2018-09-05 ENCOUNTER — Ambulatory Visit (INDEPENDENT_AMBULATORY_CARE_PROVIDER_SITE_OTHER): Payer: Medicare Other | Admitting: Gastroenterology

## 2018-09-05 ENCOUNTER — Encounter: Payer: Self-pay | Admitting: Gastroenterology

## 2018-09-05 ENCOUNTER — Encounter: Payer: Self-pay | Admitting: *Deleted

## 2018-09-05 ENCOUNTER — Other Ambulatory Visit: Payer: Self-pay

## 2018-09-05 VITALS — BP 135/89 | HR 67 | Temp 97.0°F | Ht 66.0 in | Wt 169.4 lb

## 2018-09-05 DIAGNOSIS — R7989 Other specified abnormal findings of blood chemistry: Secondary | ICD-10-CM

## 2018-09-05 DIAGNOSIS — R945 Abnormal results of liver function studies: Secondary | ICD-10-CM | POA: Diagnosis not present

## 2018-09-05 NOTE — Patient Instructions (Signed)
I have printed labs for you to keep and will likely have this repeated on Monday.  Go to [facility] Emergency Room if jaundice, dark urine, confusion, rash, enlarged lymph nodes, fever, etc.  I have ordered an ultrasound for next week.  Continue to only take your synthroid for now.   Further recommendations shortly!  I enjoyed seeing you again today! As you know, I value our relationship and want to provide genuine, compassionate, and quality care. I welcome your feedback. If you receive a survey regarding your visit,  I greatly appreciate you taking time to fill this out. See you next time!  Annitta Needs, PhD, ANP-BC [facility]

## 2018-09-05 NOTE — Progress Notes (Addendum)
Referring Provider: Sharion Balloon, FNP Primary Care Physician:  Sharion Balloon, FNP  Chief Complaint  Patient presents with   Elevated Hepatic Enzymes    had bloodwork done yesterday    HPI:   Cheryl Perry is a 73 y.o. female presenting today with a history of drug-induced hepatitis, requiring admission to [facility] in Dec 2019. Steroid taper was started at [facility] out of concern for DRESS (drug reaction with eosinophilia and systemic symptoms). Dermatology had also evaluated patient and completed skin biopsy as well due to papular rash on abdomen and legs that developed after she had been seen in the office here. Etiology of offending agent is not entirely clear but [facility] feels possibly precipitated by Crestor and also had started on Letrozole at that time. She also took azithromycin and doxycycline in November. Crestor and letrozole discontinued.   Her LFTs improved and normalized as of Feb 2020. Repeat blood draws with normal LFTs. Sep 02, 2018 had repeat blood work with PCP and found to have acute, markedly elevated transaminases with AST 659, ALT 808. Alk Phos 201. Normal tbili. Ferritin 211, iron sats 45%, iron 170. TSH normal. She was instructed to call our office.  We touched base with patient who denied fever, rash, facial swelling, enlarged lymph nodes, yellowing of eyes or skin. Repeated labs yesterday evening, 8/27, with some decrease in transaminases: AST 520, ALT 621, Alk Phos 166. Her only complaint is fatigue today. No rash. Otherwise without symptoms. INR normal.    She notes starting Biotin OTC about 4 months ago. Started B complex at same time. She was asked to stop all meds except synthroid. No other changes to medications. Steroid injection for herniated disc about a month ago.   I have placed a call to [facility] who last saw patient, Dr. Milagros Evener. I spoke to receptionist today who stated my message was received yesterday and call would be returned  today.    Last colonoscopy was in 2015, she had diverticulosis and hemorrhoids. On previous colonoscopy she had adenomatous colon polyps therefore encouraged 5-year surveillance colonoscopies. On CT Dec 2019 during evaluation for markedly elevated LFTs, small punctate nodules at right lung base noted and non-specific. While at [facility], CT Chest without contrast with nonspecific right lower lobe nodules all less than 0.4 cm in diameter, recommending 1 year follow-up  Past Medical History:  Diagnosis Date   Allergy    Arthritis    back painherniated disc lumbar   Asthma    Back pain    Cancer ([place]) 07/2017   right breast cancer, lumpectomy and 20 sessions of XRT   Complication of anesthesia    GERD (gastroesophageal reflux disease)    Hiatal hernia    Hyperlipidemia    Hypothyroidism    Osteopenia    PONV (postoperative nausea and vomiting)    asks for scop patch   Thyroid disease    Vasculopathy    lymphatic    Past Surgical History:  Procedure Laterality Date   BREAST LUMPECTOMY WITH RADIOACTIVE SEED AND SENTINEL LYMPH NODE BIOPSY Right 08/22/2017   Procedure: BREAST LUMPECTOMY WITH RADIOACTIVE SEED AND SENTINEL LYMPH NODE BIOPSY;  Surgeon: Erroll Luna, MD;  Location: [place];  Service: General;  Laterality: Right;   COLONOSCOPY  2015   Dr. Earlean Shawl: Hemorrhoids, diverticulosis.  Next colonoscopy in 5 years due to previous history of adenomatous colon polyps   CYSTOSCOPY     cystscopy  ESOPHAGOGASTRODUODENOSCOPY N/A 01/28/2015   Dr.Rourk- 3 small prepyloric/gastric ulcers- likely culprits causing bleeding. hiatal hernia, small gastic polyps not manipulated.   ESOPHAGOGASTRODUODENOSCOPY N/A 05/26/2015   Dr. Gala Romney: Small hiatal hernia, previous gastric ulcers completely healed.   KNEE SURGERY     TONSILLECTOMY     TUBAL LIGATION      Current Outpatient Medications ONLY TAKING ADVAIR AND SYNTHROID NOW UNTIL FURTHER NOTICE  Medication  Sig Dispense Refill   albuterol (PROVENTIL HFA) 108 (90 BASE) MCG/ACT inhaler Inhale 2 puffs into the lungs every 6 (six) hours as needed for wheezing. May use only 3 times a month     fluticasone-salmeterol (ADVAIR HFA) 45-21 MCG/ACT inhaler Inhale 2 puffs into the lungs 2 (two) times daily. 3 Inhaler 3   levothyroxine (SYNTHROID, LEVOTHROID) 75 MCG tablet TAKE 1 TABLET (75 MCG TOTAL)  BY MOUTH DAILY BEFORE BREAKFAST. 90 tablet 3   B Complex Vitamins (VITAMIN B COMPLEX PO) Take 1 tablet by mouth daily.     Calcium Carbonate (CALCIUM 600 PO) Take 1,200 mg by mouth daily.      Cholecalciferol (VITAMIN D) 2000 UNITS CAPS Take 2,000 Units by mouth daily.      loratadine (CLARITIN) 10 MG tablet Take 10 mg by mouth daily.     methocarbamol (ROBAXIN) 750 MG tablet Take 375 mg by mouth every 6 (six) hours as needed for muscle spasms.   2   montelukast (SINGULAIR) 10 MG tablet Take 10 mg by mouth at bedtime.     nystatin (MYCOSTATIN) 100000 UNIT/ML suspension SWISH AND SWALLOW 5 MLS 4 (FOUR) TIMES DAILY     pantoprazole (PROTONIX) 40 MG tablet Take 1 tablet (40 mg total) by mouth daily before breakfast. (Patient not taking: Reported on 09/05/2018) 90 tablet 3   triamcinolone ointment (KENALOG) 0.1 % Apply topically.     No current facility-administered medications for this visit.     Allergies as of 09/05/2018 - Review Complete 09/05/2018  Allergen Reaction Noted   Statins Other (See Comments) 01/04/2018   Sulfa antibiotics Hives 01/04/2018   Naproxen Hives and Itching 09/02/2013   Lipitor [atorvastatin]  06/05/2012   Premarin [conjugated estrogens] Itching 06/05/2012   Ampicillin Rash 06/05/2012   Avelox [moxifloxacin hcl in nacl] Rash 06/05/2012   Azithromycin Rash 01/08/2018   Bactrim [sulfamethoxazole-trimethoprim] Rash 06/05/2012   Crestor [rosuvastatin calcium]  01/23/2018   Doxycycline Rash 01/08/2018   Levaquin [levofloxacin in d5w] Rash 06/05/2012   Livalo  [pitavastatin] Other (See Comments) 01/06/2016   Macrodantin [nitrofurantoin macrocrystal] Hives and Rash 01/16/1985   Trental [pentoxifylline er] Rash 06/05/2012    Family History  Problem Relation Age of Onset   Stroke Mother    Hypertension Mother    Alzheimer's disease Mother    Heart disease Sister    Hypertension Sister    Heart attack Sister        Psychologist, forensic    Arthritis Daughter        very bad MVA    Hypertension Son    Seizures Son    Colon cancer Neg Hx    Liver cancer Neg Hx    Pancreatic disease Neg Hx     Social History   Socioeconomic History   Marital status: Married    Spouse name: Not on file   Number of children: 2   Years of education: 16   Highest education level: Bachelor's degree (e.g., BA, AB, BS)  Occupational History   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   Social Needs   Financial resource strain: Not hard at all   Food insecurity    Worry: Never true    Inability: Never true   Transportation needs    Medical: No    Non-medical: No  Tobacco Use   Smoking status: Former Smoker    Quit date: 06/06/1974    Years since quitting: 44.2   Smokeless tobacco: Never Used   Tobacco comment: Quit x 40 years ago  Substance and Sexual Activity   Alcohol use: No    Alcohol/week: 0.0 standard drinks   Drug use: No   Sexual activity: Not on file  Lifestyle   Physical activity    Days per week: 6 days    Minutes per session: 50 min   Stress: Only a little  Relationships   Social connections    Talks on phone: More than three times a week    Gets together: More than three times a week    Attends religious service: More than 4 times per year    Active member of club or organization: Yes    Attends meetings of clubs or organizations: More than 4 times per year    Relationship status: Married  Other Topics Concern   Not on file  Social History Narrative   Not on file     Review of Systems: Gen: see HPI CV: Denies chest pain, palpitations, syncope, peripheral edema, and claudication. Resp: Denies dyspnea at rest, cough, wheezing, coughing up blood, and pleurisy. GI: see HPI Derm: Denies rash, itching, dry skin Psych: Denies depression, anxiety, memory loss, confusion. No homicidal or suicidal ideation.  Heme: Denies bruising, bleeding, and enlarged lymph nodes.  Physical Exam: BP 135/89    Pulse 67    Temp (!) 97 F (36.1 C) (Oral)    Ht 5' 6"  (1.676 m)    Wt 169 lb 6.4 oz (76.8 kg)    BMI 27.34 kg/m  General:   Alert and oriented. No distress noted. Pleasant and cooperative.  Head:  Normocephalic and atraumatic. Abdomen:  +BS, soft, non-tender and non-distended. No rebound or guarding. No HSM or masses noted. Msk:  Symmetrical without gross deformities. Normal posture. Extremities:  Without edema. Neurologic:  Alert and  oriented x4 Psych:  Alert and cooperative. Normal mood and affect.  Lab Results  Component Value Date   WBC 5.8 09/04/2018   HGB 14.4 09/04/2018   HCT 42.2 09/04/2018   MCV 94.4 09/04/2018   PLT 201 09/04/2018   Lab Results  Component Value Date   ALT 621 (H) 09/04/2018   AST 520 (H) 09/04/2018   ALKPHOS 201 (H) 09/02/2018   BILITOT 0.8 09/04/2018   Lab Results  Component Value Date   CREATININE 0.67 09/02/2018   BUN 8 09/02/2018   NA 144 09/02/2018   K 4.4 09/02/2018   CL 106 09/02/2018   CO2 22 09/02/2018   Lab Results  Component Value Date   IRON 170 (H) 09/02/2018   TIBC 380 09/02/2018   FERRITIN 211 (H) 09/02/2018   Lab Results  Component Value Date   TSH 1.980 09/02/2018   Lab Results  Component Value Date   INR 1.0 09/04/2018   INR 1.07 01/02/2018   INR 1.08 12/30/2017    Biotin supplement patient has brought in:

## 2018-09-05 NOTE — Assessment & Plan Note (Addendum)
73 year old female with history of DRESS, inpatient at [facility] Dec 2019. Offending agent at that time not entirely clear but felt related to Crestor, Letrozole, azithromycin, doxycycline. Completed steroid taper by [facility] after discharge with normalized transaminases. LFTs had been normal on two occasions since then until acute bump several days ago, markedly elevated transaminases. She has been off all meds except Synthroid and inhaler since finding out abnormal results. She notes taking a Biotin supplement and B complex OTC for past few months. This has been stopped as well. I have included pictures of the Biotin packaging.   Currently, her only symptom is fatigue. She is well-versed on signs/symptoms that would necessitate urgent medical evaluation. She has no evidence for rash, jaundice. Normal INR. I have placed a call to [facility] and awaiting return call regarding possibility of repeat steroids. Encouragingly, transaminases have improved slightly. RUQ [place] ordered. Repeat HFP and INR on Monday. Awaiting call from [place]. For now, continue with holding all meds except Synthroid and inhaler. Follow-up on pending further serologies (AMA, ANA, ASMA).

## 2018-09-08 ENCOUNTER — Ambulatory Visit: Payer: Medicare Other | Admitting: Gastroenterology

## 2018-09-08 DIAGNOSIS — R945 Abnormal results of liver function studies: Secondary | ICD-10-CM | POA: Diagnosis not present

## 2018-09-08 LAB — HEPATIC FUNCTION PANEL
AG Ratio: 2 (calc) (ref 1.0–2.5)
AG Ratio: 1.9 (calc) (ref 1.0–2.5)
ALT: 621 U/L — ABNORMAL HIGH (ref 6–29)
ALT: 565 U/L — ABNORMAL HIGH (ref 6–29)
AST: 520 U/L — ABNORMAL HIGH (ref 10–35)
AST: 526 U/L — ABNORMAL HIGH (ref 10–35)
Albumin: 3.9 g/dL (ref 3.6–5.1)
Albumin: 3.9 g/dL (ref 3.6–5.1)
Alkaline phosphatase (APISO): 166 U/L — ABNORMAL HIGH (ref 37–153)
Alkaline phosphatase (APISO): 170 U/L — ABNORMAL HIGH (ref 37–153)
Bilirubin, Direct: 0.3 mg/dL — ABNORMAL HIGH (ref 0.0–0.2)
Bilirubin, Direct: 0.4 mg/dL — ABNORMAL HIGH (ref 0.0–0.2)
Globulin: 2 g/dL (calc) (ref 1.9–3.7)
Globulin: 2.1 g/dL (calc) (ref 1.9–3.7)
Indirect Bilirubin: 0.5 mg/dL (calc) (ref 0.2–1.2)
Indirect Bilirubin: 0.7 mg/dL (calc) (ref 0.2–1.2)
Total Bilirubin: 0.8 mg/dL (ref 0.2–1.2)
Total Bilirubin: 1.1 mg/dL (ref 0.2–1.2)
Total Protein: 5.9 g/dL — ABNORMAL LOW (ref 6.1–8.1)
Total Protein: 6 g/dL — ABNORMAL LOW (ref 6.1–8.1)

## 2018-09-08 LAB — CBC WITH DIFFERENTIAL/PLATELET
Absolute Monocytes: 499 cells/uL (ref 200–950)
Basophils Absolute: 58 cells/uL (ref 0–200)
Basophils Relative: 1 %
Eosinophils Absolute: 481 cells/uL (ref 15–500)
Eosinophils Relative: 8.3 %
HCT: 42.2 % (ref 35.0–45.0)
Hemoglobin: 14.4 g/dL (ref 11.7–15.5)
Lymphs Abs: 1340 cells/uL (ref 850–3900)
MCH: 32.2 pg (ref 27.0–33.0)
MCHC: 34.1 g/dL (ref 32.0–36.0)
MCV: 94.4 fL (ref 80.0–100.0)
MPV: 10.4 fL (ref 7.5–12.5)
Monocytes Relative: 8.6 %
Neutro Abs: 3422 cells/uL (ref 1500–7800)
Neutrophils Relative %: 59 %
Platelets: 201 10*3/uL (ref 140–400)
RBC: 4.47 10*6/uL (ref 3.80–5.10)
RDW: 13 % (ref 11.0–15.0)
Total Lymphocyte: 23.1 %
WBC: 5.8 10*3/uL (ref 3.8–10.8)

## 2018-09-08 LAB — IGG, IGA, IGM
IgG (Immunoglobin G), Serum: 568 mg/dL — ABNORMAL LOW (ref 600–1540)
IgM, Serum: 53 mg/dL (ref 50–300)
Immunoglobulin A: 126 mg/dL (ref 70–320)

## 2018-09-08 LAB — ANTI-NUCLEAR AB-TITER (ANA TITER)
ANA TITER: 1:40 {titer} — ABNORMAL HIGH
ANA Titer 1: 1:40 {titer} — ABNORMAL HIGH

## 2018-09-08 LAB — ANA: Anti Nuclear Antibody (ANA): POSITIVE — AB

## 2018-09-08 LAB — ANTI-SMOOTH MUSCLE ANTIBODY, IGG: Actin (Smooth Muscle) Antibody (IGG): <20 U (ref <20)

## 2018-09-08 LAB — PROTIME-INR
INR: 1
INR: 1
Prothrombin Time: 10.8 s (ref 9.0–11.5)
Prothrombin Time: 10.6 s (ref 9.0–11.5)

## 2018-09-08 LAB — MITOCHONDRIAL ANTIBODIES: Mitochondrial M2 Ab, IgG: <=20 U

## 2018-09-09 ENCOUNTER — Telehealth: Payer: Self-pay | Admitting: Internal Medicine

## 2018-09-09 ENCOUNTER — Other Ambulatory Visit: Payer: Self-pay

## 2018-09-09 DIAGNOSIS — R7989 Other specified abnormal findings of blood chemistry: Secondary | ICD-10-CM

## 2018-09-09 DIAGNOSIS — R768 Other specified abnormal immunological findings in serum: Secondary | ICD-10-CM

## 2018-09-09 NOTE — Addendum Note (Signed)
Addended by: Hassan Rowan on: 09/09/2018 01:18 PM   Modules accepted: Orders

## 2018-09-09 NOTE — Telephone Encounter (Signed)
Noted. Orders released and faxed to [facility].

## 2018-09-09 NOTE — Progress Notes (Signed)
Results reviewed with patient at time of visit last week. +ANA. Referring to Rheumatology and [place].

## 2018-09-09 NOTE — Telephone Encounter (Signed)
[facility] Clinical pool:  Hold off on Dermatology appointment. I spoke with Dr. Chapman Moss. Monitor for any rash. For now, definitely refer to Rheumatology due to positive ANA.  We are repeating HFP and INR on Thursday and ultrasound. I have added a CBC with diff. Need to monitor for eosinophilia.   Please refer to [facility] ASAP. History of DRESS in Dec 2019, improved and has been stable until recently with significant jump in transaminases. INR normal. AMA, ASMA negative. +ANA now. Clinically stable.

## 2018-09-09 NOTE — Addendum Note (Signed)
Addended by: Annitta Needs on: 09/09/2018 01:08 PM   Modules accepted: Orders

## 2018-09-09 NOTE — Telephone Encounter (Signed)
Referral sent to rheumatology via Epic.

## 2018-09-09 NOTE — Addendum Note (Signed)
Addended by: Annitta Needs on: 09/09/2018 12:11 PM   Modules accepted: Orders

## 2018-09-09 NOTE — Progress Notes (Signed)
Cheryl Perry: your AST is similar to previous, and ALT is better. Alk Phos is holding steady. We need to recheck this on Friday. How are you feeling? Your ANA is positive, when previously it was negative. I want to refer you to Rheumatology to be evaluated for autoimmune disease. I also would like to get you in for an appointment with [facility]. We can facilitate both of these appointments for you!! Continue to monitor for any recurrent rash, fever, enlarged lymph nodes, confusion, yellowing of skin or eyes.   [facility] clinical pool: please help arrange an appt back to [facility] due to history of DRESS, now with significant bump in transaminases again. All non-essential meds on hold. Please also refer to Rheumatology of choice due to positive ANA.  Cheryl Perry: please have her repeat HFP and INR on Friday  I SENT THIS IN MyCHART.

## 2018-09-09 NOTE — Telephone Encounter (Signed)
Referral faxed to [facility] for hepatology appt.

## 2018-09-09 NOTE — Addendum Note (Signed)
Addended by: Hassan Rowan on: 09/09/2018 01:42 PM   Modules accepted: Orders

## 2018-09-09 NOTE — Telephone Encounter (Signed)
Spoke with patient.   [facility] Clinical Pool and [name], see result note.   Cheryl Perry: HFP and INR for Thursday at hospital lab. I have put in orders, just need to be released.  [facility] Clinical Pool: ASAP appt to Dr. Milagros Evener with [facility], history of DRESS now with recurrent significant bump in LFTs. I have tried to make contact with her but have not been successful. Patient is established with them. Let me know if any issues getting her in. Clinic: [phone]  Also needs to get plugged in with Rheumatology due to +ANA, history of DRESS.

## 2018-09-09 NOTE — Telephone Encounter (Signed)
[phone] please call patient when you can, was unable to send you a mychart message

## 2018-09-11 ENCOUNTER — Other Ambulatory Visit (HOSPITAL_COMMUNITY)
Admission: RE | Admit: 2018-09-11 | Discharge: 2018-09-11 | Disposition: A | Discharge status: Home / Self Care | Payer: Medicare Other | Source: Ambulatory Visit | Attending: Gastroenterology | Admitting: Gastroenterology

## 2018-09-11 ENCOUNTER — Ambulatory Visit (HOSPITAL_COMMUNITY)
Admission: RE | Admit: 2018-09-11 | Discharge: 2018-09-11 | Disposition: A | Discharge status: Home / Self Care | Payer: Medicare Other | Source: Ambulatory Visit | Attending: Gastroenterology | Admitting: Gastroenterology

## 2018-09-11 ENCOUNTER — Other Ambulatory Visit: Payer: Self-pay

## 2018-09-11 DIAGNOSIS — R945 Abnormal results of liver function studies: Secondary | ICD-10-CM | POA: Diagnosis not present

## 2018-09-11 DIAGNOSIS — R7989 Other specified abnormal findings of blood chemistry: Secondary | ICD-10-CM

## 2018-09-11 DIAGNOSIS — K7689 Other specified diseases of liver: Secondary | ICD-10-CM | POA: Diagnosis not present

## 2018-09-11 DIAGNOSIS — N281 Cyst of kidney, acquired: Secondary | ICD-10-CM | POA: Diagnosis not present

## 2018-09-11 LAB — CBC WITH DIFFERENTIAL/PLATELET
Abs Immature Granulocytes: 0.01 10*3/uL (ref 0.00–0.07)
Basophils Absolute: 0.1 10*3/uL (ref 0.0–0.1)
Basophils Relative: 1 %
Eosinophils Absolute: 0.6 10*3/uL — ABNORMAL HIGH (ref 0.0–0.5)
Eosinophils Relative: 11 %
HCT: 47.3 % — ABNORMAL HIGH (ref 36.0–46.0)
Hemoglobin: 15.4 g/dL — ABNORMAL HIGH (ref 12.0–15.0)
Immature Granulocytes: 0 %
Lymphocytes Relative: 21 %
Lymphs Abs: 1.2 10*3/uL (ref 0.7–4.0)
MCH: 32.4 pg (ref 26.0–34.0)
MCHC: 32.6 g/dL (ref 30.0–36.0)
MCV: 99.4 fL (ref 80.0–100.0)
Monocytes Absolute: 0.6 10*3/uL (ref 0.1–1.0)
Monocytes Relative: 10 %
Neutro Abs: 3.2 10*3/uL (ref 1.7–7.7)
Neutrophils Relative %: 57 %
Platelets: 200 10*3/uL (ref 150–400)
RBC: 4.76 MIL/uL (ref 3.87–5.11)
RDW: 14.8 % (ref 11.5–15.5)
WBC: 5.6 10*3/uL (ref 4.0–10.5)
nRBC: 0 % (ref 0.0–0.2)

## 2018-09-11 LAB — HEPATIC FUNCTION PANEL
ALT: 585 U/L — ABNORMAL HIGH (ref 0–44)
AST: 612 U/L — ABNORMAL HIGH (ref 15–41)
Albumin: 3.9 g/dL (ref 3.5–5.0)
Alkaline Phosphatase: 172 U/L — ABNORMAL HIGH (ref 38–126)
Bilirubin, Direct: 0.4 mg/dL — ABNORMAL HIGH (ref 0.0–0.2)
Indirect Bilirubin: 0.7 mg/dL (ref 0.3–0.9)
Total Bilirubin: 1.1 mg/dL (ref 0.3–1.2)
Total Protein: 6.7 g/dL (ref 6.5–8.1)

## 2018-09-11 LAB — PROTIME-INR
INR: 1 (ref 0.8–1.2)
Prothrombin Time: 12.9 seconds (ref 11.4–15.2)

## 2018-09-11 NOTE — Telephone Encounter (Signed)
Called [facility] to f/u on appt, they have referral in system. Pt will receive a call from their system to schedule appt. Receptionist advised pt call also call them to schedule an appt.  I tried to call pt to inform her she can call [facility] to make her appt, call went straight to VM. LMOVM for return call.

## 2018-09-11 NOTE — Telephone Encounter (Signed)
Pt called office, gave her phone number to [facility]. She will call them tomorrow. Advised her AB wants appt ASAP.

## 2018-09-12 NOTE — Progress Notes (Signed)
Transaminases fluctuating, INR stable. Slight eosinophilia. Discussed with Dr. Gala Romney. Needs to be seen by [facility] ASAP. We are attempting an ASAP appt for her with [facility].

## 2018-09-15 NOTE — Progress Notes (Signed)
Ultrasound with fatty liver and hepatic and renal cysts. I discussed with Dr. Thornton Papas. Hepatic cysts unremarkable and appear to be simple cysts.

## 2018-09-16 ENCOUNTER — Other Ambulatory Visit: Payer: Self-pay | Admitting: Gastroenterology

## 2018-09-16 ENCOUNTER — Other Ambulatory Visit: Payer: Self-pay

## 2018-09-16 DIAGNOSIS — R7989 Other specified abnormal findings of blood chemistry: Secondary | ICD-10-CM

## 2018-09-16 NOTE — Progress Notes (Signed)
Orders placed for labs 9/9.

## 2018-09-17 DIAGNOSIS — R945 Abnormal results of liver function studies: Secondary | ICD-10-CM | POA: Diagnosis not present

## 2018-09-17 LAB — HEPATIC FUNCTION PANEL
AG Ratio: 1.9 (calc) (ref 1.0–2.5)
ALT: 448 U/L — ABNORMAL HIGH (ref 6–29)
AST: 473 U/L — ABNORMAL HIGH (ref 10–35)
Albumin: 3.8 g/dL (ref 3.6–5.1)
Alkaline phosphatase (APISO): 150 U/L (ref 37–153)
Bilirubin, Direct: 0.4 mg/dL — ABNORMAL HIGH (ref 0.0–0.2)
Globulin: 2 g/dL (calc) (ref 1.9–3.7)
Indirect Bilirubin: 0.5 mg/dL (calc) (ref 0.2–1.2)
Total Bilirubin: 0.9 mg/dL (ref 0.2–1.2)
Total Protein: 5.8 g/dL — ABNORMAL LOW (ref 6.1–8.1)

## 2018-09-17 LAB — BASIC METABOLIC PANEL WITH GFR
BUN: 11 mg/dL (ref 7–25)
CO2: 28 mmol/L (ref 20–32)
Calcium: 9.2 mg/dL (ref 8.6–10.4)
Chloride: 108 mmol/L (ref 98–110)
Creat: 0.76 mg/dL (ref 0.60–0.93)
GFR, Est African American: 90 mL/min/{1.73_m2} (ref >=60)
GFR, Est Non African American: 78 mL/min/{1.73_m2} (ref >=60)
Glucose, Bld: 87 mg/dL (ref 65–139)
Potassium: 4 mmol/L (ref 3.5–5.3)
Sodium: 141 mmol/L (ref 135–146)

## 2018-09-17 LAB — CBC WITH DIFFERENTIAL/PLATELET
Absolute Monocytes: 668 cells/uL (ref 200–950)
Basophils Absolute: 80 cells/uL (ref 0–200)
Basophils Relative: 1.5 %
Eosinophils Absolute: 578 cells/uL — ABNORMAL HIGH (ref 15–500)
Eosinophils Relative: 10.9 %
HCT: 42.4 % (ref 35.0–45.0)
Hemoglobin: 14.5 g/dL (ref 11.7–15.5)
Lymphs Abs: 1140 cells/uL (ref 850–3900)
MCH: 32.5 pg (ref 27.0–33.0)
MCHC: 34.2 g/dL (ref 32.0–36.0)
MCV: 95.1 fL (ref 80.0–100.0)
MPV: 10.6 fL (ref 7.5–12.5)
Monocytes Relative: 12.6 %
Neutro Abs: 2836 cells/uL (ref 1500–7800)
Neutrophils Relative %: 53.5 %
Platelets: 197 10*3/uL (ref 140–400)
RBC: 4.46 10*6/uL (ref 3.80–5.10)
RDW: 12.9 % (ref 11.0–15.0)
Total Lymphocyte: 21.5 %
WBC: 5.3 10*3/uL (ref 3.8–10.8)

## 2018-09-17 LAB — PROTIME-INR
INR: 1.1
Prothrombin Time: 11.1 s (ref 9.0–11.5)

## 2018-09-17 NOTE — Progress Notes (Signed)
Cheryl Perry: your liver numbers are better than 6 days ago! AST and ALT have decreased a bit and the lowest so far. They appear to be hopefully trending down. Your eosinophils are still slightly elevated but some improved from before. We have been following this due to history of DRESS. Please keep me updated with how your appointment at [facility] goes! Sent in [place].

## 2018-09-19 DIAGNOSIS — R945 Abnormal results of liver function studies: Secondary | ICD-10-CM | POA: Diagnosis not present

## 2018-09-19 DIAGNOSIS — R5383 Other fatigue: Secondary | ICD-10-CM | POA: Diagnosis not present

## 2018-09-19 DIAGNOSIS — Z79899 Other long term (current) drug therapy: Secondary | ICD-10-CM | POA: Diagnosis not present

## 2018-09-19 DIAGNOSIS — Z87891 Personal history of nicotine dependence: Secondary | ICD-10-CM | POA: Diagnosis not present

## 2018-09-19 DIAGNOSIS — Z853 Personal history of malignant neoplasm of breast: Secondary | ICD-10-CM | POA: Diagnosis not present

## 2018-09-19 DIAGNOSIS — K760 Fatty (change of) liver, not elsewhere classified: Secondary | ICD-10-CM | POA: Diagnosis not present

## 2018-09-19 DIAGNOSIS — K7689 Other specified diseases of liver: Secondary | ICD-10-CM | POA: Diagnosis not present

## 2018-09-19 DIAGNOSIS — N281 Cyst of kidney, acquired: Secondary | ICD-10-CM | POA: Diagnosis not present

## 2018-09-19 DIAGNOSIS — Z79811 Long term (current) use of aromatase inhibitors: Secondary | ICD-10-CM | POA: Diagnosis not present

## 2018-09-19 DIAGNOSIS — Z923 Personal history of irradiation: Secondary | ICD-10-CM | POA: Diagnosis not present

## 2018-09-19 DIAGNOSIS — D721 Eosinophilia: Secondary | ICD-10-CM | POA: Diagnosis not present

## 2018-09-23 NOTE — Progress Notes (Signed)
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   History of Present Illness: Cheryl Perry is a 73 y.o. female seen in consultation per request of her gastroenterologist for evaluation of elevated LFTs and positive ANA.  According to patient she was diagnosed with breast cancer in July 2019.  She underwent lumpectomy in August 2019 and then radiation therapy several courses.  She states after the radiation therapy finished she started experiencing increased fatigue.  She was also found to have elevated LFTs.  She states she underwent liver biopsy and then was referred to [facility] for second opinion.  She was also taking Crestor and she decided to discontinue Crestor.  She recalls that she took doxycycline in December 40 urinary tract infection.  After that she was hospitalized with a rash and elevated LFTs and was discharged in January 2020.  She was also given a prednisone taper while she was in the hospital.  She states after that her liver functions were normal and he stayed normal for a while until August her liver function climbed up again.  She was advised to come off all the vitamins.  She was again seen at the [facility] where she had a skin biopsy according to patient the skin biopsy was nonspecific.  She states her liver functions have been stable but have not come down.  Recent labs showed that her ANA was positive and she was referred to me for further evaluation.  She gives history of a stiffness in her elbows, hips and knee joints.  There is no history of joint swelling.  She states that she has chronic disc disease involving her cervical, thoracic and lumbar spine for which she sees Dr. Nelva Bush.  She has chronic pain in her  back.  Activities of Daily Living:  Patient reports morning stiffness for 60 minutes.   Patient Denies nocturnal pain.  Difficulty dressing/grooming: Denies Difficulty climbing stairs: Denies Difficulty getting out of chair: Reports Difficulty using hands for taps, buttons, cutlery, and/or writing: Denies  Review of Systems  Constitutional: Positive for fatigue. Negative for night sweats, weight gain and weight loss.  HENT: Negative for mouth sores, trouble swallowing, trouble swallowing, mouth dryness and nose dryness.   Eyes: Negative for pain, redness, visual disturbance and dryness.  Respiratory: Negative for cough, shortness of breath and difficulty breathing.   Cardiovascular: Negative for chest pain, palpitations, hypertension, irregular heartbeat and swelling in legs/feet.  Gastrointestinal: Negative for blood in stool, constipation and diarrhea.  Endocrine: Negative for increased urination.  Genitourinary: Negative for vaginal dryness.  Musculoskeletal: Positive for arthralgias, joint pain and morning stiffness. Negative for joint swelling, myalgias, muscle weakness, muscle tenderness and myalgias.  Skin: Negative for color change, rash, hair loss, skin tightness, ulcers and sensitivity to sunlight.  Allergic/Immunologic: Negative for susceptible to infections.  Neurological: Negative for dizziness, memory loss, night sweats and weakness.  Hematological: Negative for swollen glands.  Psychiatric/Behavioral: Negative for depressed mood and sleep disturbance. The patient is not nervous/anxious.     PMFS History:  Patient Active Problem List   Diagnosis Date Noted  Elevated liver function tests 12/25/2017   H/O adenomatous polyp of colon 12/12/2017   Malignant neoplasm of upper-outer quadrant of right breast in female, estrogen receptor positive ([place]) 07/12/2017   Back pain 12/14/2016   Overweight (BMI 25.0-29.9) 06/14/2015   Gastric ulceration    Hiatal hernia     History of lower GI bleeding    UGI bleed 01/27/2015   Vitamin D deficiency 06/15/2014   GERD (gastroesophageal reflux disease) 06/15/2014   Osteopenia 09/02/2013   Hypothyroidism 06/05/2012   HLD (hyperlipidemia) 06/05/2012   Asthma 06/05/2012    Past Medical History:  Diagnosis Date   Allergy    Arthritis    back painherniated disc lumbar   Asthma    Back pain    Cancer ([place]) 07/2017   right breast cancer, lumpectomy and 20 sessions of XRT   Complication of anesthesia    GERD (gastroesophageal reflux disease)    Hiatal hernia    Hyperlipidemia    Hypothyroidism    Osteopenia    PONV (postoperative nausea and vomiting)    asks for scop patch   Thyroid disease    Vasculopathy    lymphatic    Family History  Problem Relation Age of Onset   Stroke Mother    Hypertension Mother    Alzheimer's disease Mother    Heart disease Sister    Hypertension Sister    Heart attack Sister        Psychologist, forensic    Arthritis Daughter        very bad MVA    Hypertension Son    Seizures Son    Colon cancer Neg Hx    Liver cancer Neg Hx    Pancreatic disease Neg Hx    Past Surgical History:  Procedure Laterality Date   BREAST LUMPECTOMY WITH RADIOACTIVE SEED AND SENTINEL LYMPH NODE BIOPSY Right 08/22/2017   Procedure: BREAST LUMPECTOMY WITH RADIOACTIVE SEED AND SENTINEL LYMPH NODE BIOPSY;  Surgeon: Erroll Luna, MD;  Location: [facility];  Service: General;  Laterality: Right;   COLONOSCOPY  2015   Dr. Earlean Shawl: Hemorrhoids, diverticulosis.  Next colonoscopy in 5 years due to previous history of adenomatous colon polyps   CYSTOSCOPY     cystscopy     ESOPHAGOGASTRODUODENOSCOPY N/A 01/28/2015   Dr.Rourk- 3 small prepyloric/gastric ulcers- likely culprits causing bleeding. hiatal hernia, small gastic polyps not manipulated.   ESOPHAGOGASTRODUODENOSCOPY N/A 05/26/2015   Dr. Gala Romney: Small hiatal hernia, previous gastric ulcers completely  healed.   KNEE SURGERY     TONSILLECTOMY     TUBAL LIGATION     Social History   Social History Narrative   Not on file   Immunization History  Administered Date(s) Administered   Hepatitis A 04/03/2016   Influenza, High Dose Seasonal PF 10/11/2015, 10/10/2016, 10/14/2017   Influenza,inj,Quad PF,6+ Mos 10/18/2014   Influenza-Unspecified 12/09/2012, 11/09/2013   Pneumococcal Conjugate-13 12/09/2013   Pneumococcal Polysaccharide-23 05/04/2011   Tdap 06/09/2003, 06/09/2013   Typhoid Live 04/03/2016   Zoster 04/21/2010     Objective: Vital Signs: BP 122/80 (BP Location: Left Arm, Patient Position: Sitting, Cuff Size: Small)    Pulse 66    Resp 12    Ht 5\' 3"  (1.6 m)    Wt 162 lb 12.8 oz (73.8 kg)    BMI 28.84 kg/m    Physical Exam Vitals signs and nursing note reviewed.  Constitutional:      Appearance: She is well-developed.  HENT:     Head: Normocephalic and  atraumatic.  Eyes:     Conjunctiva/sclera: Conjunctivae normal.  Neck:     Musculoskeletal: Normal range of motion.  Cardiovascular:     Rate and Rhythm: Normal rate and regular rhythm.     Heart sounds: Normal heart sounds.  Pulmonary:     Effort: Pulmonary effort is normal.     Breath sounds: Normal breath sounds.  Abdominal:     General: Bowel sounds are normal.     Palpations: Abdomen is soft.  Lymphadenopathy:     Cervical: No cervical adenopathy.  Skin:    General: Skin is warm and dry.     Capillary Refill: Capillary refill takes less than 2 seconds.  Neurological:     Mental Status: She is alert and oriented to person, place, and time.  Psychiatric:        Behavior: Behavior normal.      Musculoskeletal Exam: She has limited range of motion of cervical spine with some discomfort.  Shoulder joints and elbow joints in good range of motion.  She had mild tenderness over left lateral epicondyle area.  She had bilateral PIP and DIP thickening but no synovitis.  Hip joints and knee joints  with good range of motion.  She has hammertoes bilaterally with no synovitis.  CDAI Exam: CDAI Score: -- Patient Global: --; Provider Global: -- Swollen: --; Tender: -- Joint Exam   No joint exam has been documented for this visit   There is currently no information documented on the homunculus. Go to the Rheumatology activity and complete the homunculus joint exam.  Investigation: Findings:  09/04/2018: ANA 140 cytoplasmic, 1: 40 NS, IgA 26, IgG 568, IgM 53, INR 1.0, PT 10.8, actin antibody positive, mitochondrial M2 antibody negat  Component     Latest Ref Rng & Units 09/04/2018  ANA Titer 1     titer 1:40 (H)  ANA Pattern 1      Cytoplasmic (A)  ANA TITER     titer 1:40 (H)  ANA PATTERN      Nuclear, Speckled (A)  Immunoglobulin A     70 - 320 mg/dL 126  IgG (Immunoglobin G), Serum     600 - 1,540 mg/dL 568 (L)  IgM, Serum     50 - 300 mg/dL 53  INR      1.0  Prothrombin Time     9.0 - 11.5 sec 10.8  Actin (Smooth Muscle) Antibody (IGG)     <20 U <20  Anti Nuclear Antibody (ANA)     NEGATIVE POSITIVE (A)  Mitochondrial M2 Ab, IgG     U < OR = 20.0   Imaging: US Abdomen Limited Ruq  Result Date: 09/11/2018 CLINICAL DATA:  Elevated LFTs, history of DRESS EXAM: ULTRASOUND ABDOMEN LIMITED RIGHT UPPER QUADRANT COMPARISON:  Skull 06/27/2017 FINDINGS: Gallbladder: Normally distended without stones or wall thickening. No pericholecystic fluid or sonographic Murphy sign. Common bile duct: Diameter: 4 mm, normal Liver: Echogenic parenchyma, likely fatty infiltration though this can be seen with cirrhosis and certain infiltrative disorders. Small hepatic cysts, 2.0 cm greatest size RIGHT lobe, 1.2 cm greatest size RIGHT lobe, 1.1 cm RIGHT lobe. No additional hepatic masses or nodularity. Portal vein is patent on color Doppler imaging with normal direction of blood flow towards the liver. Other: No RIGHT upper quadrant free fluid. Multiple RIGHT renal cysts incidentally noted  largest 3.2 cm greatest size. IMPRESSION: Hepatic and RIGHT renal cysts. Probable fatty infiltration of liver as above. Electronically Signed   By: Elta Guadeloupe  Thornton Papas M.D.   On: 09/11/2018 10:01    Recent Labs: Lab Results  Component Value Date   WBC 5.3 09/17/2018   HGB 14.5 09/17/2018   PLT 197 09/17/2018   NA 141 09/17/2018   K 4.0 09/17/2018   CL 108 09/17/2018   CO2 28 09/17/2018   GLUCOSE 87 09/17/2018   BUN 11 09/17/2018   CREATININE 0.76 09/17/2018   BILITOT 0.9 09/17/2018   ALKPHOS 172 (H) 09/11/2018   AST 473 (H) 09/17/2018   ALT 448 (H) 09/17/2018   PROT 5.8 (L) 09/17/2018   ALBUMIN 3.9 09/11/2018   CALCIUM 9.2 09/17/2018   GFRAA 90 09/17/2018    Speciality Comments: No specialty comments available.  Procedures:  No procedures performed Allergies: Statins, Sulfa antibiotics, Naproxen, Lipitor [atorvastatin], Premarin [conjugated estrogens], Ampicillin, Avelox [moxifloxacin hcl in nacl], Azithromycin, Bactrim [sulfamethoxazole-trimethoprim], Crestor [rosuvastatin calcium], Doxycycline, Levaquin [levofloxacin in d5w], Livalo [pitavastatin], Macrodantin [nitrofurantoin macrocrystal], and Trental [pentoxifylline er]   Assessment / Plan:     Visit Diagnoses: Positive ANA (antinuclear antibody) - 09/04/2018: ANA 140 cytoplasmic, 1: 40 NS, IgA 26, IgG 568, IgM 53, INR 1.0, PT 10.8, actin antibody positive, mitochondrial M2 antibody negative.  Patient has no clinical features of autoimmune disease on examination.  She has very low titer ANA positive.  I will obtain AVISE labs.  Elevated liver function tests-patient has chronic elevation of LFTs for a year now.  Her LFTs became normal from January until this August.  She had extensive work-up including ultrasound, CT scan, liver biopsy per patient.  She is followed by gastroenterology here at [facility].  Primary osteoarthritis of both hands-clinical findings are consistent with osteoarthritis.  I did not see any synovitis.  Joint protection  was discussed.  She is not having much symptoms in her hands.  Rash-patient gives history of rash in the past which was biopsied and was nonspecific.  She was also seen by dermatologist at [facility].  Primary osteoarthritis of both feet-clinical findings are consistent with osteoarthritis.  DDD (degenerative disc disease), cervical-chronic discomfort followed by Dr. Jeanell Sparrow most.  DDD (degenerative disc disease), thoracic-chronic discomfort.  DDD (degenerative disc disease), lumbar-chronic discomfort.  Osteopenia of multiple sites-followed by her PCP.  Vitamin D deficiency-patient states that she was taking supplement but she discontinued.  Malignant neoplasm of upper-outer quadrant of right breast in female, estrogen receptor positive ([facility])-she was diagnosed in July 2019 and treated with lumpectomy and radiation therapy per patient.  Other medical problems are listed as follows:  History of hypothyroidism  Hiatal hernia  History of lower GI bleeding  H/O adenomatous polyp of colon  History of hyperlipidemia  History of gastroesophageal reflux (GERD)  History of asthma  Orders: No orders of the defined types were placed in this encounter.  No orders of the defined types were placed in this encounter.   Face-to-face time spent with patient was 50 minutes. Greater than 50% of time was spent in counseling and coordination of care.  Follow-Up Instructions: Return for +ANA, elevated LFTs.   Bo Merino, MD  Note - This record has been created using Editor, commissioning.  Chart creation errors have been sought, but may not always  have been located. Such creation errors do not reflect on  the standard of medical care.

## 2018-09-24 ENCOUNTER — Encounter: Payer: Self-pay | Admitting: Rheumatology

## 2018-09-24 ENCOUNTER — Ambulatory Visit (INDEPENDENT_AMBULATORY_CARE_PROVIDER_SITE_OTHER): Payer: Medicare Other | Admitting: Rheumatology

## 2018-09-24 ENCOUNTER — Other Ambulatory Visit: Payer: Self-pay

## 2018-09-24 VITALS — BP 122/80 | HR 66 | Resp 12 | Ht 63.0 in | Wt 162.8 lb

## 2018-09-24 DIAGNOSIS — Z8639 Personal history of other endocrine, nutritional and metabolic disease: Secondary | ICD-10-CM

## 2018-09-24 DIAGNOSIS — K449 Diaphragmatic hernia without obstruction or gangrene: Secondary | ICD-10-CM

## 2018-09-24 DIAGNOSIS — R7689 Other specified abnormal immunological findings in serum: Secondary | ICD-10-CM

## 2018-09-24 DIAGNOSIS — E559 Vitamin D deficiency, unspecified: Secondary | ICD-10-CM

## 2018-09-24 DIAGNOSIS — Z170 Estrogen receptor positive status [ER+]: Secondary | ICD-10-CM

## 2018-09-24 DIAGNOSIS — R945 Abnormal results of liver function studies: Secondary | ICD-10-CM | POA: Diagnosis not present

## 2018-09-24 DIAGNOSIS — R7989 Other specified abnormal findings of blood chemistry: Secondary | ICD-10-CM

## 2018-09-24 DIAGNOSIS — Z860101 Personal history of adenomatous and serrated colon polyps: Secondary | ICD-10-CM

## 2018-09-24 DIAGNOSIS — M8589 Other specified disorders of bone density and structure, multiple sites: Secondary | ICD-10-CM | POA: Diagnosis not present

## 2018-09-24 DIAGNOSIS — R768 Other specified abnormal immunological findings in serum: Secondary | ICD-10-CM | POA: Diagnosis not present

## 2018-09-24 DIAGNOSIS — C50411 Malignant neoplasm of upper-outer quadrant of right female breast: Secondary | ICD-10-CM | POA: Diagnosis not present

## 2018-09-24 DIAGNOSIS — M5136 Other intervertebral disc degeneration, lumbar region: Secondary | ICD-10-CM

## 2018-09-24 DIAGNOSIS — Z8719 Personal history of other diseases of the digestive system: Secondary | ICD-10-CM | POA: Diagnosis not present

## 2018-09-24 DIAGNOSIS — M5134 Other intervertebral disc degeneration, thoracic region: Secondary | ICD-10-CM | POA: Diagnosis not present

## 2018-09-24 DIAGNOSIS — M19072 Primary osteoarthritis, left ankle and foot: Secondary | ICD-10-CM

## 2018-09-24 DIAGNOSIS — M5030 Other cervical disc degeneration, unspecified cervical region: Secondary | ICD-10-CM | POA: Diagnosis not present

## 2018-09-24 DIAGNOSIS — Z86010 Personal history of colonic polyps: Secondary | ICD-10-CM | POA: Diagnosis not present

## 2018-09-24 DIAGNOSIS — M51369 Other intervertebral disc degeneration, lumbar region without mention of lumbar back pain or lower extremity pain: Secondary | ICD-10-CM

## 2018-09-24 DIAGNOSIS — Z8709 Personal history of other diseases of the respiratory system: Secondary | ICD-10-CM

## 2018-09-24 DIAGNOSIS — M19042 Primary osteoarthritis, left hand: Secondary | ICD-10-CM

## 2018-09-24 DIAGNOSIS — M19071 Primary osteoarthritis, right ankle and foot: Secondary | ICD-10-CM

## 2018-09-24 DIAGNOSIS — M19041 Primary osteoarthritis, right hand: Secondary | ICD-10-CM

## 2018-09-29 DIAGNOSIS — D8989 Other specified disorders involving the immune mechanism, not elsewhere classified: Secondary | ICD-10-CM | POA: Diagnosis not present

## 2018-09-29 DIAGNOSIS — R768 Other specified abnormal immunological findings in serum: Secondary | ICD-10-CM | POA: Diagnosis not present

## 2018-10-01 NOTE — Progress Notes (Signed)
Office Visit Note  Patient: Cheryl Perry             Date of Birth: 04-06-1945           MRN: [id number]             PCP: Sharion Balloon, FNP Referring: Sharion Balloon, FNP Visit Date: 10/15/2018 Occupation: @GUAROCC @  Subjective:  Discuss lab work   History of Present Illness: Cheryl Perry is a 73 y.o. female with history of positive ANA, osteoarthritis, and DDD. She has pain in both hands but denies any joint swelling. She has chronic lower back pain due to a herniated disc in her lumbar spine.  She reports she was recently diagnosed with autoimmune hepatitis. She is followed by a GI specialist at [facility] and has had a liver biopsy on 10/02/18 that revealed findings suspicious for autoimmune hepatitis. She states 2 days ago she was started on prednisone 40 mg daily for 14 days.  She Perry be repeating liver enzymes in 2 weeks.    Activities of Daily Living:  Patient reports morning stiffness for 30 minutes.   Patient Reports nocturnal pain.  Difficulty dressing/grooming: Denies Difficulty climbing stairs: Denies Difficulty getting out of chair: Denies Difficulty using hands for taps, buttons, cutlery, and/or writing: Reports  Review of Systems  Constitutional: Positive for fatigue.  HENT: Negative for mouth sores, mouth dryness and nose dryness.   Eyes: Negative for pain, itching, visual disturbance and dryness.  Respiratory: Negative for cough, hemoptysis, shortness of breath and difficulty breathing.   Cardiovascular: Negative for chest pain, palpitations, hypertension and swelling in legs/feet.  Gastrointestinal: Negative for abdominal pain, blood in stool, constipation and diarrhea.  Endocrine: Negative for increased urination.  Genitourinary: Negative for difficulty urinating and painful urination.  Musculoskeletal: Positive for arthralgias, joint pain and morning stiffness. Negative for joint swelling, myalgias, muscle weakness, muscle tenderness and myalgias.  Skin:  Negative for color change, pallor, rash, hair loss, nodules/bumps, skin tightness, ulcers and sensitivity to sunlight.  Allergic/Immunologic: Negative for susceptible to infections.  Neurological: Negative for dizziness, numbness, headaches and memory loss.  Hematological: Negative for bruising/bleeding tendency and swollen glands.  Psychiatric/Behavioral: Negative for depressed mood, confusion and sleep disturbance. The patient is not nervous/anxious.     PMFS History:  Patient Active Problem List   Diagnosis Date Noted  . Elevated liver function tests 12/25/2017  . H/O adenomatous polyp of colon 12/12/2017  . Malignant neoplasm of upper-outer quadrant of right breast in female, estrogen receptor positive ([place]) 07/12/2017  . Back pain 12/14/2016  . Overweight (BMI 25.0-29.9) 06/14/2015  . Gastric ulceration   . Hiatal hernia   . History of lower GI bleeding   . UGI bleed 01/27/2015  . Vitamin D deficiency 06/15/2014  . GERD (gastroesophageal reflux disease) 06/15/2014  . Osteopenia 09/02/2013  . Hypothyroidism 06/05/2012  . HLD (hyperlipidemia) 06/05/2012  . Asthma 06/05/2012    Past Medical History:  Diagnosis Date  . Allergy   . Arthritis    back painherniated disc lumbar  . Asthma   . Back pain   . Cancer ([place]) 07/2017   right breast cancer, lumpectomy and 20 sessions of XRT  . Complication of anesthesia   . GERD (gastroesophageal reflux disease)   . Hiatal hernia   . Hyperlipidemia   . Hypothyroidism   . Osteopenia   . PONV (postoperative nausea and vomiting)    asks for scop patch  . Thyroid disease   . Vasculopathy  lymphatic    Family History  Problem Relation Age of Onset  . Stroke Mother   . Hypertension Mother   . Alzheimer's disease Mother   . Heart disease Sister   . Hypertension Sister   . Heart attack Sister        Psychologist, forensic   . Arthritis Daughter        very bad MVA   . Hypertension Son   . Seizures Son   . Colon cancer Neg Hx   . Liver  cancer Neg Hx   . Pancreatic disease Neg Hx    Past Surgical History:  Procedure Laterality Date  . BREAST LUMPECTOMY WITH RADIOACTIVE SEED AND SENTINEL LYMPH NODE BIOPSY Right 08/22/2017   Procedure: BREAST LUMPECTOMY WITH RADIOACTIVE SEED AND SENTINEL LYMPH NODE BIOPSY;  Surgeon: Erroll Luna, MD;  Location: [facility];  Service: General;  Laterality: Right;  . COLONOSCOPY  2015   Dr. Earlean Shawl: Hemorrhoids, diverticulosis.  Next colonoscopy in 5 years due to previous history of adenomatous colon polyps  . CYSTOSCOPY    . cystscopy    . ESOPHAGOGASTRODUODENOSCOPY N/A 01/28/2015   Dr.Rourk- 3 small prepyloric/gastric ulcers- likely culprits causing bleeding. hiatal hernia, small gastic polyps not manipulated.  . ESOPHAGOGASTRODUODENOSCOPY N/A 05/26/2015   Dr. Gala Romney: Small hiatal hernia, previous gastric ulcers completely healed.  Marland Kitchen KNEE SURGERY    . LIVER BIOPSY  10/09/2018  . TONSILLECTOMY    . TUBAL LIGATION     Social History   Social History Narrative  . Not on file   Immunization History  Administered Date(s) Administered  . Hepatitis A 04/03/2016  . Influenza, High Dose Seasonal PF 10/11/2015, 10/10/2016, 10/14/2017  . Influenza,inj,Quad PF,6+ Mos 10/18/2014  . Influenza-Unspecified 12/09/2012, 11/09/2013  . Pneumococcal Conjugate-13 12/09/2013  . Pneumococcal Polysaccharide-23 05/04/2011  . Tdap 06/09/2003, 06/09/2013  . Typhoid Live 04/03/2016  . Zoster 04/21/2010     Objective: Vital Signs: BP 128/79 (BP Location: Left Arm, Patient Position: Sitting, Cuff Size: Normal)   Pulse 61   Resp 12   Ht 5\' 6"  (1.676 m)   Wt 165 lb 12.8 oz (75.2 kg)   BMI 26.76 kg/m    Physical Exam Vitals signs and nursing note reviewed.  Constitutional:      Appearance: She is well-developed.  HENT:     Head: Normocephalic and atraumatic.  Eyes:     Conjunctiva/sclera: Conjunctivae normal.  Neck:     Musculoskeletal: Normal range of motion.  Cardiovascular:      Rate and Rhythm: Normal rate and regular rhythm.     Heart sounds: Normal heart sounds.  Pulmonary:     Effort: Pulmonary effort is normal.     Breath sounds: Normal breath sounds.  Abdominal:     General: Bowel sounds are normal.     Palpations: Abdomen is soft.  Lymphadenopathy:     Cervical: No cervical adenopathy.  Skin:    General: Skin is warm and dry.     Capillary Refill: Capillary refill takes less than 2 seconds.  Neurological:     Mental Status: She is alert and oriented to person, place, and time.  Psychiatric:        Behavior: Behavior normal.      Musculoskeletal Exam: C-spine, thoracic spine, and lumbar spine good ROM.  No midline spinal tenderness.  No SI joint tenderness. Shoulder joints, elbow joints, wrist joints, MCPs, PIPs, and DIPs good ROM with no synovitis. PIP and DIP synovial thickening.  Hip joints, knee  joints, ankle joints, MTPs, PIPs, and DIPs good ROM with no synovitis.  No warmth or effusion of knee joints.  No tenderness or swelling of ankle joints.    CDAI Exam: CDAI Score: - Patient Global: -; Provider Global: - Swollen: -; Tender: - Joint Exam   No joint exam has been documented for this visit   There is currently no information documented on the homunculus. Go to the Rheumatology activity and complete the homunculus joint exam.  Investigation: No additional findings.  Imaging: No results found.  Recent Labs: Lab Results  Component Value Date   WBC 5.3 09/17/2018   HGB 14.5 09/17/2018   PLT 197 09/17/2018   NA 141 09/17/2018   K 4.0 09/17/2018   CL 108 09/17/2018   CO2 28 09/17/2018   GLUCOSE 87 09/17/2018   BUN 11 09/17/2018   CREATININE 0.76 09/17/2018   BILITOT 0.9 09/17/2018   ALKPHOS 172 (H) 09/11/2018   AST 473 (H) 09/17/2018   ALT 448 (H) 09/17/2018   PROT 5.8 (L) 09/17/2018   ALBUMIN 3.9 09/11/2018   CALCIUM 9.2 09/17/2018   GFRAA 90 09/17/2018    Speciality Comments: No specialty comments available.   Procedures:  No procedures performed Allergies: Statins, Sulfa antibiotics, Naproxen, Lipitor [atorvastatin], Premarin [conjugated estrogens], Ampicillin, Avelox [moxifloxacin hcl in nacl], Azithromycin, Bactrim [sulfamethoxazole-trimethoprim], Crestor [rosuvastatin calcium], Doxycycline, Levaquin [levofloxacin in d5w], Livalo [pitavastatin], Macrodantin [nitrofurantoin macrocrystal], and Trental [pentoxifylline er]     Assessment / Plan:     Visit Diagnoses: Positive ANA (antinuclear antibody) - Patient had no clinical features of autoimmune disease. AVISE index -0.4, ANA titer negative, RF 18, TPO antibody positive.  AVISE labs were reviewed with the patient today.  All questions were addressed today.  She is currently on prednisone 40 mg daily for 14 days due to the suspicion for autoimmune hepatitis.   The elevation in LFTs has been responsive to prednisone in the past. She has no synovitis on exam. Some of her symptoms may be being masked by high dose prednisone currently. She was advised to notify us if she develops any new or worsening symptoms.  She Perry follow up in 3 months.   Elevated liver function tests - ALT 448 and AST 473 on 09/17/18.  She has been [facility]ology.  She had a liver biopsy performed on 10/02/18.  The findings of the biopsy were consistent with possible autoimmune hepatitis. She was started on Prednisone 40 mg for 14 days.  She Perry return for lab work in 2 weeks to recheck LFTs.    Primary osteoarthritis of both hands - She has PIP and DIP synovial thickening consistent with osteoarthritis of both hands.  She has no tenderness or synovitis on exam.  She has complete fist formation bilaterally.  Joint protection and muscle strengthening were discussed.  She was advised to notify us if she develops increased joint pain or joint swelling.   Primary osteoarthritis of both feet: She has no feet pain or joint swelling at this time.   DDD (degenerative disc  disease), cervical: She has good ROM with no discomfort. C-spine crepitus.  No symptoms of radiculopathy.   DDD (degenerative disc disease), thoracic: No midline spinal tenderness.   DDD (degenerative disc disease), lumbar: Chronic pain.  She has midline spinal tenderness in the lumbar region.  She has good ROM with mild discomfort.  She has a herniated disc in the lumbar spine according to the patient.    Osteopenia of multiple sites: She  is taking vitamin D 2,000 units by mouth daily.   Vitamin D deficiency: She is taking vitamin D 2,000 units by mouth daily.   Other medical conditions are listed as follows:   Malignant neoplasm of upper-outer quadrant of right breast in female, estrogen receptor positive ([place])  History of hypothyroidism  History of lower GI bleeding  Hiatal hernia  H/O adenomatous polyp of colon  History of hyperlipidemia  History of gastroesophageal reflux (GERD)  History of asthma  Orders: No orders of the defined types were placed in this encounter.  No orders of the defined types were placed in this encounter.     Follow-Up Instructions: Return in about 3 months (around 01/15/2019) for Positive ANA, Elevated LFTs.   Ofilia Neas, PA-C   I examined and evaluated the patient with Hazel Sams PA.  Patient states that she was recently diagnosed with autoimmune hepatitis.  She is on prednisone 40 mg p.o. daily.  I detailed discussion regarding her recent labs with her today.  The labs are not consistent with systemic lupus.  She has positive rheumatoid factor but did not have any synovitis on examination.  It is difficult to judge that if she is synovitis because she is on high-dose prednisone.  I have advised her to contact me in case she develops any joint swelling when she starts tapering her prednisone.  The plan of care was discussed as noted above.  Bo Merino, MD  Note - This record has been created using Editor, commissioning.  Chart creation  errors have been sought, but may not always  have been located. Such creation errors do not reflect on  the standard of medical care.

## 2018-10-02 DIAGNOSIS — K740 Hepatic fibrosis: Secondary | ICD-10-CM | POA: Diagnosis not present

## 2018-10-02 DIAGNOSIS — K759 Inflammatory liver disease, unspecified: Secondary | ICD-10-CM | POA: Diagnosis not present

## 2018-10-02 DIAGNOSIS — K739 Chronic hepatitis, unspecified: Secondary | ICD-10-CM | POA: Diagnosis not present

## 2018-10-02 DIAGNOSIS — R748 Abnormal levels of other serum enzymes: Secondary | ICD-10-CM | POA: Diagnosis not present

## 2018-10-09 HISTORY — PX: LIVER BIOPSY: SHX301

## 2018-10-15 ENCOUNTER — Other Ambulatory Visit: Payer: Self-pay

## 2018-10-15 ENCOUNTER — Ambulatory Visit (INDEPENDENT_AMBULATORY_CARE_PROVIDER_SITE_OTHER): Payer: Medicare Other | Admitting: Rheumatology

## 2018-10-15 ENCOUNTER — Ambulatory Visit: Payer: Medicare Other | Admitting: Rheumatology

## 2018-10-15 ENCOUNTER — Encounter: Payer: Self-pay | Admitting: Rheumatology

## 2018-10-15 VITALS — BP 128/79 | HR 61 | Resp 12 | Ht 66.0 in | Wt 165.8 lb

## 2018-10-15 DIAGNOSIS — M19041 Primary osteoarthritis, right hand: Secondary | ICD-10-CM | POA: Diagnosis not present

## 2018-10-15 DIAGNOSIS — M19042 Primary osteoarthritis, left hand: Secondary | ICD-10-CM

## 2018-10-15 DIAGNOSIS — M5136 Other intervertebral disc degeneration, lumbar region: Secondary | ICD-10-CM | POA: Diagnosis not present

## 2018-10-15 DIAGNOSIS — M19072 Primary osteoarthritis, left ankle and foot: Secondary | ICD-10-CM

## 2018-10-15 DIAGNOSIS — Z8709 Personal history of other diseases of the respiratory system: Secondary | ICD-10-CM

## 2018-10-15 DIAGNOSIS — M5134 Other intervertebral disc degeneration, thoracic region: Secondary | ICD-10-CM

## 2018-10-15 DIAGNOSIS — K449 Diaphragmatic hernia without obstruction or gangrene: Secondary | ICD-10-CM

## 2018-10-15 DIAGNOSIS — R768 Other specified abnormal immunological findings in serum: Secondary | ICD-10-CM | POA: Diagnosis not present

## 2018-10-15 DIAGNOSIS — M19071 Primary osteoarthritis, right ankle and foot: Secondary | ICD-10-CM

## 2018-10-15 DIAGNOSIS — C50411 Malignant neoplasm of upper-outer quadrant of right female breast: Secondary | ICD-10-CM | POA: Diagnosis not present

## 2018-10-15 DIAGNOSIS — R7989 Other specified abnormal findings of blood chemistry: Secondary | ICD-10-CM

## 2018-10-15 DIAGNOSIS — Z170 Estrogen receptor positive status [ER+]: Secondary | ICD-10-CM

## 2018-10-15 DIAGNOSIS — E559 Vitamin D deficiency, unspecified: Secondary | ICD-10-CM

## 2018-10-15 DIAGNOSIS — M8589 Other specified disorders of bone density and structure, multiple sites: Secondary | ICD-10-CM

## 2018-10-15 DIAGNOSIS — M5030 Other cervical disc degeneration, unspecified cervical region: Secondary | ICD-10-CM | POA: Diagnosis not present

## 2018-10-15 DIAGNOSIS — Z86010 Personal history of colonic polyps: Secondary | ICD-10-CM

## 2018-10-15 DIAGNOSIS — Z8719 Personal history of other diseases of the digestive system: Secondary | ICD-10-CM

## 2018-10-15 DIAGNOSIS — Z8639 Personal history of other endocrine, nutritional and metabolic disease: Secondary | ICD-10-CM

## 2018-10-17 DIAGNOSIS — K754 Autoimmune hepatitis: Secondary | ICD-10-CM | POA: Diagnosis not present

## 2018-10-17 DIAGNOSIS — Z79899 Other long term (current) drug therapy: Secondary | ICD-10-CM | POA: Diagnosis not present

## 2018-10-17 DIAGNOSIS — Z23 Encounter for immunization: Secondary | ICD-10-CM | POA: Diagnosis not present

## 2018-10-17 DIAGNOSIS — M8580 Other specified disorders of bone density and structure, unspecified site: Secondary | ICD-10-CM | POA: Diagnosis not present

## 2018-10-17 DIAGNOSIS — Z1159 Encounter for screening for other viral diseases: Secondary | ICD-10-CM | POA: Diagnosis not present

## 2018-10-17 DIAGNOSIS — D849 Immunodeficiency, unspecified: Secondary | ICD-10-CM | POA: Diagnosis not present

## 2018-10-24 ENCOUNTER — Encounter: Payer: Self-pay | Admitting: Hematology and Oncology

## 2018-11-10 ENCOUNTER — Other Ambulatory Visit: Payer: Self-pay

## 2018-11-10 ENCOUNTER — Other Ambulatory Visit: Payer: Medicare Other

## 2018-11-10 DIAGNOSIS — K754 Autoimmune hepatitis: Secondary | ICD-10-CM | POA: Diagnosis not present

## 2018-11-13 ENCOUNTER — Other Ambulatory Visit: Payer: Self-pay | Admitting: *Deleted

## 2018-11-13 DIAGNOSIS — Z20822 Contact with and (suspected) exposure to covid-19: Secondary | ICD-10-CM

## 2018-11-14 LAB — NOVEL CORONAVIRUS, NAA: SARS-CoV-2, NAA: NOT DETECTED

## 2018-11-17 ENCOUNTER — Ambulatory Visit (INDEPENDENT_AMBULATORY_CARE_PROVIDER_SITE_OTHER): Payer: Medicare Other | Admitting: Family

## 2018-11-17 ENCOUNTER — Encounter: Payer: Self-pay | Admitting: Family

## 2018-11-17 DIAGNOSIS — M2550 Pain in unspecified joint: Secondary | ICD-10-CM | POA: Diagnosis not present

## 2018-11-17 DIAGNOSIS — R509 Fever, unspecified: Secondary | ICD-10-CM

## 2018-11-17 MED ORDER — AMOXICILLIN-POT CLAVULANATE 875-125 MG PO TABS: 1.0000 | ORAL_TABLET | Freq: Two times a day (BID) | ORAL | 0 refills | Status: DC

## 2018-11-17 NOTE — Addendum Note (Signed)
Addended by: [name] A on: 11/17/2018 12:14 PM   Modules accepted: Orders, Level of Service

## 2018-11-17 NOTE — Progress Notes (Addendum)
Virtual Visit via telephone Note Due to COVID-19 pandemic this visit was conducted virtually. This visit type was conducted due to national recommendations for restrictions regarding the COVID-19 Pandemic (e.g. social distancing, sheltering in place) in an effort to limit this patient's exposure and mitigate transmission in our community. All issues noted in this document were discussed and addressed.  A physical exam was not performed with this format.  Attempted 10:52 AM, No answer, VM left.   Attempted to call patient at 11;15 AM, no answer. VM left.   I connected with Cheryl Perry on 11/17/18 at 11:34 AM by telephone and verified that I am speaking with the correct person using two identifiers. Cheryl Perry is currently located at home and no one  is currently with her during visit. The provider, [name], FNP is located in their office at time of visit.  I discussed the limitations, risks, security and privacy concerns of performing an evaluation and management service by telephone and the availability of in person appointments. I also discussed with the patient that there may be a patient responsible charge related to this service. The patient expressed understanding and agreed to proceed.   History and Present Illness:  Pt calls the office today with fever, joint pain, and achyness. She has autoimmune hepatis and is currently taking Imuran and prednisone 20 mg daily. She is followed by GI and Rheumatologists.She was negative for COVID  On 11/13/18. They thought she had some type of infection. Fever  This is a new problem. The current episode started in the past 7 days (Wednesday). The problem occurs intermittently (mostly at night). The problem has been gradually worsening (100.7 F). Associated symptoms include muscle aches and sleepiness. Pertinent negatives include no coughing, ear pain, headaches, nausea, sore throat or urinary pain. Associated symptoms comments: Joint  pain. She has tried fluids for the symptoms. The treatment provided no relief.       Review of Systems  Constitutional: Positive for fever.  HENT: Negative for ear pain and sore throat.   Respiratory: Negative for cough.   Gastrointestinal: Negative for nausea.  Genitourinary: Negative for dysuria.  Neurological: Negative for headaches.  All other systems reviewed and are negative.    Observations/Objective: No SOB or distress noted, mild weakness noted.  Assessment and Plan: 1. Fever, unspecified fever cause - amoxicillin-clavulanate (AUGMENTIN) 875-125 MG tablet; Take 1 tablet by mouth 2 (two) times daily.  Dispense: 14 tablet; Refill: 0  2. Arthralgia, unspecified joint - amoxicillin-clavulanate (AUGMENTIN) 875-125 MG tablet; Take 1 tablet by mouth 2 (two) times daily.  Dispense: 14 tablet; Refill: 0  Given fever and body aches and negative COVID, I will treat with antibiotics.  Can not tolerate doxycycline or zpak related to possible DRESS syndrome. Strict precaution discussed to go to ED. Keep all follow up with specialists.      I discussed the assessment and treatment plan with the patient. The patient was provided an opportunity to ask questions and all were answered. The patient agreed with the plan and demonstrated an understanding of the instructions.   The patient was advised to call back or seek an in-person evaluation if the symptoms worsen or if the condition fails to improve as anticipated.  The above assessment and management plan was discussed with the patient. The patient verbalized understanding of and has agreed to the management plan. Patient is aware to call the clinic if symptoms persist or worsen. Patient is aware when to return to the  clinic for a follow-up visit. Patient educated on when it is appropriate to go to the emergency department.   Time call ended:  11:49 AM  I provided 15 minutes of non-face-to-face time during this encounter.     [name], FNP

## 2018-11-18 ENCOUNTER — Ambulatory Visit: Payer: Medicare Other | Admitting: Rheumatology

## 2018-11-25 ENCOUNTER — Other Ambulatory Visit: Payer: Self-pay

## 2018-11-25 ENCOUNTER — Other Ambulatory Visit: Payer: Medicare Other

## 2018-11-25 DIAGNOSIS — K754 Autoimmune hepatitis: Secondary | ICD-10-CM | POA: Diagnosis not present

## 2018-12-01 ENCOUNTER — Other Ambulatory Visit: Payer: Self-pay

## 2018-12-01 ENCOUNTER — Other Ambulatory Visit: Payer: Medicare Other

## 2018-12-01 DIAGNOSIS — K754 Autoimmune hepatitis: Secondary | ICD-10-CM | POA: Diagnosis not present

## 2018-12-02 ENCOUNTER — Other Ambulatory Visit: Payer: Self-pay

## 2018-12-02 DIAGNOSIS — Z20828 Contact with and (suspected) exposure to other viral communicable diseases: Secondary | ICD-10-CM | POA: Diagnosis not present

## 2018-12-02 DIAGNOSIS — Z20822 Contact with and (suspected) exposure to covid-19: Secondary | ICD-10-CM

## 2018-12-03 LAB — NOVEL CORONAVIRUS, NAA: SARS-CoV-2, NAA: NOT DETECTED

## 2018-12-09 ENCOUNTER — Other Ambulatory Visit: Payer: Self-pay

## 2018-12-09 ENCOUNTER — Telehealth: Payer: Self-pay | Admitting: Family

## 2018-12-09 ENCOUNTER — Other Ambulatory Visit: Payer: Medicare Other

## 2018-12-09 DIAGNOSIS — K754 Autoimmune hepatitis: Secondary | ICD-10-CM | POA: Diagnosis not present

## 2018-12-09 NOTE — Chronic Care Management (AMB) (Signed)
Chronic Care Management   Note  12/09/2018 Name: Cheryl Perry MRN: [id number] DOB: 28-May-1945  Cheryl Perry is a 73 y.o. year old female who is a primary care patient of Sharion Balloon, FNP. I reached out to [name] by phone today in response to a referral sent by Ms. Althea Grimmer Kable's health plan.     Ms. Zwilling was given information about Chronic Care Management services today including:  1. CCM service includes personalized support from designated clinical staff supervised by her physician, including individualized plan of care and coordination with other care providers 2. 24/7 contact phone numbers for assistance for urgent and routine care needs. 3. Service will only be billed when office clinical staff spend 20 minutes or more in a month to coordinate care. 4. Only one practitioner may furnish and bill the service in a calendar month. 5. The patient may stop CCM services at any time (effective at the end of the month) by phone call to the office staff. 6. The patient will be responsible for cost sharing (co-pay) of up to 20% of the service fee (after annual deductible is met).  Patient did not agree to enrollment in care management services and does not wish to consider at this time.  Follow up plan: The patient has been provided with contact information for the chronic care management team and has been advised to call with any health related questions or concerns.   [place], [place] [address] Direct Dial: [email or web address]  Website: [email or web address]

## 2018-12-15 ENCOUNTER — Other Ambulatory Visit: Payer: Medicare Other

## 2018-12-15 ENCOUNTER — Other Ambulatory Visit: Payer: Self-pay

## 2018-12-15 DIAGNOSIS — K754 Autoimmune hepatitis: Secondary | ICD-10-CM | POA: Diagnosis not present

## 2018-12-17 ENCOUNTER — Encounter: Payer: Self-pay | Admitting: Hematology and Oncology

## 2018-12-19 DIAGNOSIS — K7689 Other specified diseases of liver: Secondary | ICD-10-CM | POA: Diagnosis not present

## 2018-12-19 DIAGNOSIS — T451X5A Adverse effect of antineoplastic and immunosuppressive drugs, initial encounter: Secondary | ICD-10-CM | POA: Diagnosis not present

## 2018-12-19 DIAGNOSIS — R030 Elevated blood-pressure reading, without diagnosis of hypertension: Secondary | ICD-10-CM | POA: Diagnosis not present

## 2018-12-19 DIAGNOSIS — D849 Immunodeficiency, unspecified: Secondary | ICD-10-CM | POA: Diagnosis not present

## 2018-12-19 DIAGNOSIS — K7402 Hepatic fibrosis, advanced fibrosis: Secondary | ICD-10-CM | POA: Diagnosis not present

## 2018-12-19 DIAGNOSIS — K754 Autoimmune hepatitis: Secondary | ICD-10-CM | POA: Diagnosis not present

## 2018-12-23 ENCOUNTER — Other Ambulatory Visit: Payer: Medicare Other

## 2018-12-23 ENCOUNTER — Other Ambulatory Visit: Payer: Self-pay

## 2018-12-28 ENCOUNTER — Other Ambulatory Visit: Payer: Self-pay

## 2018-12-28 ENCOUNTER — Emergency Department (HOSPITAL_COMMUNITY)
Admission: EM | Admit: 2018-12-28 | Discharge: 2018-12-28 | Disposition: A | Discharge status: Home / Self Care | Payer: Medicare Other | Attending: Emergency Medicine | Admitting: Emergency Medicine

## 2018-12-28 ENCOUNTER — Encounter (HOSPITAL_COMMUNITY): Payer: Self-pay

## 2018-12-28 DIAGNOSIS — Z87891 Personal history of nicotine dependence: Secondary | ICD-10-CM | POA: Diagnosis not present

## 2018-12-28 DIAGNOSIS — E039 Hypothyroidism, unspecified: Secondary | ICD-10-CM | POA: Insufficient documentation

## 2018-12-28 DIAGNOSIS — I10 Essential (primary) hypertension: Secondary | ICD-10-CM | POA: Insufficient documentation

## 2018-12-28 DIAGNOSIS — Z79899 Other long term (current) drug therapy: Secondary | ICD-10-CM | POA: Diagnosis not present

## 2018-12-28 DIAGNOSIS — R002 Palpitations: Secondary | ICD-10-CM | POA: Diagnosis not present

## 2018-12-28 DIAGNOSIS — J45909 Unspecified asthma, uncomplicated: Secondary | ICD-10-CM | POA: Insufficient documentation

## 2018-12-28 HISTORY — DX: Autoimmune hepatitis: K75.4

## 2018-12-28 LAB — CBC WITH DIFFERENTIAL/PLATELET
Abs Immature Granulocytes: 0.11 10*3/uL — ABNORMAL HIGH (ref 0.00–0.07)
Basophils Absolute: 0.1 10*3/uL (ref 0.0–0.1)
Basophils Relative: 1 %
Eosinophils Absolute: 0.1 10*3/uL (ref 0.0–0.5)
Eosinophils Relative: 1 %
HCT: 48.2 % — ABNORMAL HIGH (ref 36.0–46.0)
Hemoglobin: 15.5 g/dL — ABNORMAL HIGH (ref 12.0–15.0)
Immature Granulocytes: 1 %
Lymphocytes Relative: 25 %
Lymphs Abs: 2.1 10*3/uL (ref 0.7–4.0)
MCH: 32.3 pg (ref 26.0–34.0)
MCHC: 32.2 g/dL (ref 30.0–36.0)
MCV: 100.4 fL — ABNORMAL HIGH (ref 80.0–100.0)
Monocytes Absolute: 0.8 10*3/uL (ref 0.1–1.0)
Monocytes Relative: 9 %
Neutro Abs: 5.3 10*3/uL (ref 1.7–7.7)
Neutrophils Relative %: 63 %
Platelets: 207 10*3/uL (ref 150–400)
RBC: 4.8 MIL/uL (ref 3.87–5.11)
RDW: 12.9 % (ref 11.5–15.5)
WBC: 8.3 10*3/uL (ref 4.0–10.5)
nRBC: 0 % (ref 0.0–0.2)

## 2018-12-28 LAB — BASIC METABOLIC PANEL
Anion gap: 8 (ref 5–15)
BUN: 14 mg/dL (ref 8–23)
CO2: 27 mmol/L (ref 22–32)
Calcium: 9.4 mg/dL (ref 8.9–10.3)
Chloride: 108 mmol/L (ref 98–111)
Creatinine, Ser: 0.75 mg/dL (ref 0.44–1.00)
GFR calc Af Amer: >60 mL/min (ref >60)
GFR calc non Af Amer: >60 mL/min (ref >60)
Glucose, Bld: 89 mg/dL (ref 70–99)
Potassium: 3.7 mmol/L (ref 3.5–5.1)
Sodium: 143 mmol/L (ref 135–145)

## 2018-12-28 NOTE — Discharge Instructions (Addendum)
Blood pressures here in the emergency department have been trending normal he had a few isolated elevated blood pressures.  Recommend keeping a daily log of your blood pressures following up with your primary care doctor.  Also follow-up with your gastroenterology folks at [facility] since one of your new medications perhaps could elevate blood pressure.  Complete blood count and basic metabolic panel labs here today were all normal.  We did not check your liver function test.

## 2018-12-28 NOTE — ED Notes (Signed)
Pt was very anxious on arrival. Has calmed down and is resting at this time.

## 2018-12-28 NOTE — ED Triage Notes (Signed)
Pt reports her bp has been elevated in 003'K systolic recently and was 188 this morning.  Reports a "fluttering" feeling in her chest.  Denies pain.  Reports has autoimmune hepatitis.

## 2018-12-28 NOTE — ED Provider Notes (Signed)
[facility] EMERGENCY DEPARTMENT Provider Note   CSN: [id number] Arrival date & time: 12/28/18  0930     History Chief Complaint  Patient presents with  . heart fluttering    Cheryl Perry is a 73 y.o. female.  Patient followed at [facility] gastroenterology for a history of autoimmune hepatitis.  Was recently started on new medication by them which in theory could elevate her blood pressure.  Patient has been getting blood pressures around 244 systolic and this morning was 188 which were concerned her.  She felt some fluttering in her chest.  Did not feel as if her heart was going fast.  Denies any chest pain shortness of breath.  Denies headache or any strokelike symptoms.  No visual changes no speech problems no numbness or weakness.  Patient does not have a pre-existing history of hypertension.        Past Medical History:  Diagnosis Date  . Allergy   . Arthritis    back painherniated disc lumbar  . Asthma   . Autoimmune hepatitis ([place])   . Back pain   . Cancer ([place]) 07/2017   right breast cancer, lumpectomy and 20 sessions of XRT  . Complication of anesthesia   . GERD (gastroesophageal reflux disease)   . Hiatal hernia   . Hyperlipidemia   . Hypothyroidism   . Osteopenia   . PONV (postoperative nausea and vomiting)    asks for scop patch  . Thyroid disease   . Vasculopathy    lymphatic    Patient Active Problem List   Diagnosis Date Noted  . Elevated liver function tests 12/25/2017  . H/O adenomatous polyp of colon 12/12/2017  . Malignant neoplasm of upper-outer quadrant of right breast in female, estrogen receptor positive ([place]) 07/12/2017  . Back pain 12/14/2016  . Overweight (BMI 25.0-29.9) 06/14/2015  . Gastric ulceration   . Hiatal hernia   . History of lower GI bleeding   . UGI bleed 01/27/2015  . Vitamin D deficiency 06/15/2014  . GERD (gastroesophageal reflux disease) 06/15/2014  . Osteopenia 09/02/2013  . Hypothyroidism 06/05/2012  . HLD  (hyperlipidemia) 06/05/2012  . Asthma 06/05/2012    Past Surgical History:  Procedure Laterality Date  . BREAST LUMPECTOMY WITH RADIOACTIVE SEED AND SENTINEL LYMPH NODE BIOPSY Right 08/22/2017   Procedure: BREAST LUMPECTOMY WITH RADIOACTIVE SEED AND SENTINEL LYMPH NODE BIOPSY;  Surgeon: Erroll Luna, MD;  Location: [place];  Service: General;  Laterality: Right;  . COLONOSCOPY  2015   Dr. Earlean Shawl: Hemorrhoids, diverticulosis.  Next colonoscopy in 5 years due to previous history of adenomatous colon polyps  . CYSTOSCOPY    . cystscopy    . ESOPHAGOGASTRODUODENOSCOPY N/A 01/28/2015   Dr.Rourk- 3 small prepyloric/gastric ulcers- likely culprits causing bleeding. hiatal hernia, small gastic polyps not manipulated.  . ESOPHAGOGASTRODUODENOSCOPY N/A 05/26/2015   Dr. Gala Romney: Small hiatal hernia, previous gastric ulcers completely healed.  Marland Kitchen KNEE SURGERY    . LIVER BIOPSY  10/09/2018  . TONSILLECTOMY    . TUBAL LIGATION       OB History   No obstetric history on file.     Family History  Problem Relation Age of Onset  . Stroke Mother   . Hypertension Mother   . Alzheimer's disease Mother   . Heart disease Sister   . Hypertension Sister   . Heart attack Sister        Psychologist, forensic   . Arthritis Daughter        very  bad MVA   . Hypertension Son   . Seizures Son   . Colon cancer Neg Hx   . Liver cancer Neg Hx   . Pancreatic disease Neg Hx     Social History   Tobacco Use  . Smoking status: Former Smoker    Quit date: 06/06/1974    Years since quitting: 44.5  . Smokeless tobacco: Never Used  . Tobacco comment: Quit x 40 years ago  Substance Use Topics  . Alcohol use: No    Alcohol/week: 0.0 standard drinks  . Drug use: No    Home Medications Prior to Admission medications   Medication Sig Start Date End Date Taking? Authorizing Provider  albuterol (PROVENTIL HFA) 108 (90 BASE) MCG/ACT inhaler Inhale 2 puffs into the lungs every 6 (six) hours as needed for  wheezing. May use only 3 times a month   Yes [provider]  Cholecalciferol (VITAMIN D) 2000 UNITS CAPS Take 2,000 Units by mouth daily.    Yes [provider]  fluticasone-salmeterol (ADVAIR HFA) 45-21 MCG/ACT inhaler Inhale 2 puffs into the lungs 2 (two) times daily. 12/14/14  Yes Hawks, Christy A, FNP  levothyroxine (SYNTHROID, LEVOTHROID) 75 MCG tablet TAKE 1 TABLET (75 MCG TOTAL)  BY MOUTH DAILY BEFORE BREAKFAST. Patient taking differently: Take 75 mcg by mouth daily before breakfast. TAKE 1 TABLET (75 MCG TOTAL)  BY MOUTH DAILY BEFORE BREAKFAST. 01/09/18  Yes Hawks, Christy A, FNP  loratadine (CLARITIN) 10 MG tablet Take 10 mg by mouth daily.   Yes [provider]  methocarbamol (ROBAXIN) 750 MG tablet Take 375 mg by mouth every 6 (six) hours as needed for muscle spasms.  09/27/17  Yes [provider]  pantoprazole (PROTONIX) 40 MG tablet Take 1 tablet (40 mg total) by mouth daily before breakfast. 06/17/18  Yes Annitta Needs, NP  amoxicillin-clavulanate (AUGMENTIN) 875-125 MG tablet Take 1 tablet by mouth 2 (two) times daily. Patient not taking: Reported on 12/28/2018 11/17/18   Sharion Balloon, FNP  azaTHIOprine Ilean Skill) 50 MG tablet Take by mouth. 10/24/18 10/24/19  [provider]  Calcium Carbonate-Vitamin D 600-400 MG-UNIT tablet Take by mouth.    [provider]  METHYLPREDNISOLONE PO Take 40 mg by mouth daily.    [provider]  mycophenolate (CELLCEPT) 500 MG tablet Take by mouth. 12/12/18 01/11/19  [provider]  nystatin (MYCOSTATIN) 100000 UNIT/ML suspension SWISH AND SWALLOW 5 MLS 4 (FOUR) TIMES DAILY 01/08/18   [provider]  predniSONE (DELTASONE) 20 MG tablet TAKE 2 TABLETS (40 MG TOTAL) BY MOUTH ONCE DAILY FOR 14 DAYS 10/13/18   [provider]  predniSONE (DELTASONE) 5 MG tablet Take 15 mg by mouth daily.  12/19/18 12/19/19  [provider]    Allergies    Statins, Sulfa  antibiotics, Naproxen, Lipitor [atorvastatin], Premarin [conjugated estrogens], Ampicillin, Avelox [moxifloxacin hcl in nacl], Azithromycin, Bactrim [sulfamethoxazole-trimethoprim], Crestor [rosuvastatin calcium], Doxycycline, Levaquin [levofloxacin in d5w], Livalo [pitavastatin], Macrodantin [nitrofurantoin macrocrystal], and Trental [pentoxifylline er]  Review of Systems   Review of Systems  Constitutional: Negative for chills and fever.  HENT: Negative for congestion, rhinorrhea and sore throat.   Eyes: Negative for visual disturbance.  Respiratory: Negative for cough and shortness of breath.   Cardiovascular: Positive for palpitations. Negative for chest pain and leg swelling.  Gastrointestinal: Negative for abdominal pain, diarrhea, nausea and vomiting.  Genitourinary: Negative for dysuria.  Musculoskeletal: Negative for back pain and neck pain.  Skin: Negative for rash.  Neurological: Negative  for dizziness, light-headedness and headaches.  Hematological: Does not bruise/bleed easily.  Psychiatric/Behavioral: Negative for confusion.    Physical Exam Updated Vital Signs BP (!) 170/85   Pulse 63   Temp 97.9 F (36.6 C) (Oral)   Resp 20   Ht 1.676 m (5\' 6" )   Wt 75.3 kg   SpO2 98%   BMI 26.79 kg/m   Physical Exam Vitals and nursing note reviewed.  Constitutional:      General: She is not in acute distress.    Appearance: Normal appearance. She is well-developed.  HENT:     Head: Normocephalic and atraumatic.  Eyes:     Extraocular Movements: Extraocular movements intact.     Conjunctiva/sclera: Conjunctivae normal.     Pupils: Pupils are equal, round, and reactive to light.  Cardiovascular:     Rate and Rhythm: Normal rate and regular rhythm.     Heart sounds: No murmur.  Pulmonary:     Effort: Pulmonary effort is normal. No respiratory distress.     Breath sounds: Normal breath sounds.  Abdominal:     Palpations: Abdomen is soft.     Tenderness: There is no  abdominal tenderness.  Musculoskeletal:        General: Normal range of motion.     Cervical back: Normal range of motion and neck supple.  Skin:    General: Skin is warm and dry.     Capillary Refill: Capillary refill takes less than 2 seconds.     Coloration: Skin is not jaundiced.  Neurological:     General: No focal deficit present.     Mental Status: She is alert and oriented to person, place, and time.     Cranial Nerves: No cranial nerve deficit.     Sensory: No sensory deficit.     Motor: No weakness.     Coordination: Coordination normal.     ED Results / Procedures / Treatments   Labs (all labs ordered are listed, but only abnormal results are displayed) Labs Reviewed  CBC WITH DIFFERENTIAL/PLATELET - Abnormal; Notable for the following components:      Result Value   Hemoglobin 15.5 (*)    HCT 48.2 (*)    MCV 100.4 (*)    Abs Immature Granulocytes 0.11 (*)    All other components within normal limits  BASIC METABOLIC PANEL    EKG EKG Interpretation  Date/Time:  Sunday December 28 2018 09:42:12 EST Ventricular Rate:  58 PR Interval:    QRS Duration: 84 QT Interval:  422 QTC Calculation: 415 R Axis:   -27 Text Interpretation: Sinus rhythm Low voltage, precordial leads Left ventricular hypertrophy No previous ECGs available Confirmed by Fredia Sorrow [phone]) on 12/28/2018 10:36:20 AM   Radiology No results found.  Procedures Procedures (including critical care time)  Medications Ordered in ED Medications - No data to display  ED Course  I have reviewed the triage vital signs and the nursing notes.  Pertinent labs & imaging results that were available during my care of the patient were reviewed by me and considered in my medical decision making (see chart for details).    MDM Rules/Calculators/A&P                      Blood pressure here has ranged from systolics of 604- 33 to the highest of 170.  Has fluctuated but the trend has been below  140. Patient without any significant symptoms.  CBC basic metabolic panel without any  acute abnormalities.  Cardiac monitoring without any arrhythmias.  Patient without any symptoms here.  Will have patient follow-up with her primary care doctor after keeping a blood pressure log book.  And also to check back with her GI specialist at [facility] regarding one of her new medications that she was started on which she states possibly could elevate blood pressure.  Patient stable for discharge home.    Final Clinical Impression(s) / ED Diagnoses Final diagnoses:  Palpitations  Hypertension, unspecified type    Rx / DC Orders ED Discharge Orders    None       Fredia Sorrow, MD 12/28/18 1144

## 2019-01-05 ENCOUNTER — Other Ambulatory Visit: Payer: Self-pay

## 2019-01-05 ENCOUNTER — Other Ambulatory Visit: Payer: Medicare Other

## 2019-01-06 ENCOUNTER — Telehealth: Payer: Self-pay | Admitting: Family

## 2019-01-06 NOTE — Telephone Encounter (Signed)
Appt made

## 2019-01-07 ENCOUNTER — Other Ambulatory Visit: Payer: Self-pay

## 2019-01-07 ENCOUNTER — Encounter (HOSPITAL_COMMUNITY): Payer: Self-pay

## 2019-01-07 ENCOUNTER — Ambulatory Visit (HOSPITAL_COMMUNITY)
Admission: RE | Admit: 2019-01-07 | Discharge: 2019-01-07 | Disposition: A | Discharge status: Home / Self Care | Payer: Medicare Other | Source: Ambulatory Visit | Attending: Adult Health | Admitting: Adult Health

## 2019-01-07 DIAGNOSIS — I700 Atherosclerosis of aorta: Secondary | ICD-10-CM | POA: Diagnosis not present

## 2019-01-07 DIAGNOSIS — R918 Other nonspecific abnormal finding of lung field: Secondary | ICD-10-CM | POA: Insufficient documentation

## 2019-01-07 DIAGNOSIS — C50411 Malignant neoplasm of upper-outer quadrant of right female breast: Secondary | ICD-10-CM | POA: Insufficient documentation

## 2019-01-07 DIAGNOSIS — Z170 Estrogen receptor positive status [ER+]: Secondary | ICD-10-CM | POA: Insufficient documentation

## 2019-01-07 DIAGNOSIS — C50919 Malignant neoplasm of unspecified site of unspecified female breast: Secondary | ICD-10-CM | POA: Diagnosis not present

## 2019-01-07 MED ORDER — IOHEXOL 300 MG/ML  SOLN
75.0000 mL | Freq: Once | INTRAMUSCULAR | Status: AC | PRN
  Administered 2019-01-07: 09:00:00 75 mL via INTRAVENOUS

## 2019-01-07 MED ORDER — SODIUM CHLORIDE (PF) 0.9 % IJ SOLN
INTRAMUSCULAR | Status: AC
  Filled 2019-01-07: qty 50

## 2019-01-12 ENCOUNTER — Other Ambulatory Visit: Payer: Self-pay

## 2019-01-12 ENCOUNTER — Encounter: Payer: Self-pay | Admitting: Family

## 2019-01-12 ENCOUNTER — Ambulatory Visit (INDEPENDENT_AMBULATORY_CARE_PROVIDER_SITE_OTHER): Payer: Medicare Other | Admitting: Family

## 2019-01-12 VITALS — BP 128/86 | HR 81 | Temp 97.7°F | Ht 66.0 in | Wt 173.4 lb

## 2019-01-12 DIAGNOSIS — Z09 Encounter for follow-up examination after completed treatment for conditions other than malignant neoplasm: Secondary | ICD-10-CM

## 2019-01-12 DIAGNOSIS — R030 Elevated blood-pressure reading, without diagnosis of hypertension: Secondary | ICD-10-CM | POA: Diagnosis not present

## 2019-01-12 DIAGNOSIS — F439 Reaction to severe stress, unspecified: Secondary | ICD-10-CM | POA: Diagnosis not present

## 2019-01-12 DIAGNOSIS — K754 Autoimmune hepatitis: Secondary | ICD-10-CM | POA: Diagnosis not present

## 2019-01-12 NOTE — Progress Notes (Signed)
Subjective:    Patient ID: Cheryl Perry, female    DOB: 01/16/1945, 74 y.o.   MRN: [id number]  Chief Complaint  Patient presents with  . Hypertension   PT presents to the office today to have BP checked and hospital follow up. She states on 12/28/18 her BP was 188/97 and was having palpations. She states she went to the ED and had a stable EKG and stable CBC and CMP.   She is followed by GI specialists at [facility] for Autoimmune Hepatitis.   She states her her stress has been very increased. She had to take her husband to the ED yesterday and was diagnosed with pneumonia. Her son has also been sick and she has had several medication changes.    She is not not taking any blood pressure medications and her BP is stable today.  Her home BP log is around 120-175's/80's. Hypertension This is a chronic problem. The current episode started more than 1 year ago. The problem has been waxing and waning since onset. The problem is controlled. Pertinent negatives include no headaches, peripheral edema or shortness of breath. Risk factors for coronary artery disease include obesity and sedentary lifestyle. The current treatment provides mild improvement.      Review of Systems  Respiratory: Negative for shortness of breath.   Neurological: Negative for headaches.  Psychiatric/Behavioral: The patient is nervous/anxious.   All other systems reviewed and are negative.      Objective:   Physical Exam Vitals reviewed.  Constitutional:      General: She is not in acute distress.    Appearance: She is well-developed.  HENT:     Head: Normocephalic and atraumatic.  Eyes:     Pupils: Pupils are equal, round, and reactive to light.  Neck:     Thyroid: No thyromegaly.  Cardiovascular:     Rate and Rhythm: Normal rate and regular rhythm.     Heart sounds: Normal heart sounds. No murmur.  Pulmonary:     Effort: Pulmonary effort is normal. No respiratory distress.     Breath sounds: Normal breath  sounds. No wheezing.  Abdominal:     General: Bowel sounds are normal. There is no distension.     Palpations: Abdomen is soft.     Tenderness: There is no abdominal tenderness.  Musculoskeletal:        General: No tenderness. Normal range of motion.     Cervical back: Normal range of motion and neck supple.  Skin:    General: Skin is warm and dry.  Neurological:     Mental Status: She is alert and oriented to person, place, and time.     Cranial Nerves: No cranial nerve deficit.     Deep Tendon Reflexes: Reflexes are normal and symmetric.  Psychiatric:        Behavior: Behavior normal.        Thought Content: Thought content normal.        Judgment: Judgment normal.     BP 128/86   Pulse 81   Temp 97.7 F (36.5 C) (Temporal)   Ht 5\' 6"  (1.676 m)   Wt 173 lb 6.4 oz (78.7 kg)   SpO2 98%   BMI 27.99 kg/m      Assessment & Plan:  Cheryl Perry comes in today with chief complaint of Hypertension   Diagnosis and orders addressed:  1. Hospital discharge follow-up  2. Elevated blood pressure reading  3. Stress  4. Autoimmune hepatitis ([place])  BP stable today, will hold off on any medications at this. I believe this is related to stress. Stress management discussed Keep all specialists with GI Continue to monitor BP at home Hospital notes reviewed    [name], FNP

## 2019-01-12 NOTE — Patient Instructions (Signed)

## 2019-01-13 ENCOUNTER — Telehealth: Payer: Self-pay

## 2019-01-13 NOTE — Telephone Encounter (Signed)
Per Mendel Ryder Pt called to relay CT scan results, Informed Pt per Mendel Ryder her CT scan result showed no cancer in her abdomen. Pt verbalized understanding. No further problems or concerns noted.

## 2019-01-19 NOTE — Progress Notes (Signed)
Virtual Visit via Telephone Note  I connected with Cheryl Perry on 01/21/19 at 10:30 AM EST by telephone and verified that I am speaking with the correct person using two identifiers.  Location: Patient: Home  Provider: Clinic  This service was conducted via virtual visit.  The patient was located at home. I was located in my office.  Consent was obtained prior to the virtual visit and is aware of possible charges through their insurance for this visit.  The patient is an established patient.  Dr. Estanislado Pandy, MD conducted the virtual visit and Hazel Sams, PA-C acted as scribe during the service.  Office staff helped with scheduling follow up visits after the service was conducted.     I discussed the limitations, risks, security and privacy concerns of performing an evaluation and management service by telephone and the availability of in person appointments. I also discussed with the patient that there may be a patient responsible charge related to this service. The patient expressed understanding and agreed to proceed.  CC: Discuss liver biopsy results  History of Present Illness: Patient is a 74 year old female with a past medical history of positive ANA, osteoarthritis, and DDD. She reports she was diagnosed with autoimmune hepatitis after having a liver biopsy on 10/02/18.  She was initially started on prednisone 40 mg po daily and Imuran. She developed cholestatic liver injury and GI SE while taking Imuran so she discontinued.  She was then started on Cellcept, which she has been tolerating.  Her LFTs have improving according to the patient.  She denies any other features of autoimmune disease at this time.   Review of Systems  Constitutional: Positive for malaise/fatigue. Negative for fever.  HENT: Negative for congestion.   Eyes: Negative for photophobia, pain, discharge and redness.  Respiratory: Negative for cough, shortness of breath and wheezing.   Cardiovascular: Negative for  chest pain, palpitations and leg swelling.  Gastrointestinal: Negative for blood in stool, constipation and diarrhea.  Genitourinary: Negative for dysuria and urgency.  Musculoskeletal: Positive for back pain. Negative for joint pain, myalgias and neck pain.  Skin: Negative for rash.  Neurological: Negative for dizziness, weakness and headaches.  Endo/Heme/Allergies: Does not bruise/bleed easily.  Psychiatric/Behavioral: Negative for depression and memory loss. The patient is not nervous/anxious and does not have insomnia.       Observations/Objective: Physical Exam  Constitutional: She is oriented to person, place, and time.  Neurological: She is alert and oriented to person, place, and time.  Psychiatric: Mood, memory, affect and judgment normal.   Patient reports morning stiffness for  0 NONE.   Patient denies nocturnal pain.  Difficulty dressing/grooming: Denies Difficulty climbing stairs: Denies Difficulty getting out of chair: Denies Difficulty using hands for taps, buttons, cutlery, and/or writing: Reports   Assessment and Plan: Visit Diagnoses: Positive ANA (antinuclear antibody) -  AVISE index -0.4, ANA titer negative, RF 18, TPO antibody positive. She was diagnosed with autoimmune hepatitis after undergoing a liver biopsy on 10/02/18 and is currently on Cellcept 500 mg 1 tablet BID and is tapering prednisone. She has no other clinical features of autoimmune disease at this time.  She was advised to notify us if she develops any new or worsening symptoms.  She will follow up in 1 year.  Autoimmune hepatitis ([place]) -Bx positive on 10/02/18.  She is followed by Dr. Nolon Lennert at [facility]. She was initially started on prednisone 40 mg daily (started on 10/14/18) and was able to start tapering prednisone  after adding on Imuran.  She developed AZA induced cholestatic liver injury and upper GI SE and discontinued Imuran.  She was then started on Cellcept 500 mg 1 tablet BID,  which she has been tolerating.  Her LFTs continue to trend down.  She is currently on prednisone 10 mg daily and the plan is to continue to taper as LFTs normalize.  She will continue to follow up with [facility].   High risk medication use: Cellcept 500 mg 1 tablet by mouth twice daily prescribed by GI. Discontinued Imuran-due to developing AZA induced cholestatic liver injury and upper GI SE.   Primary osteoarthritis of both hands -She is not having any pain or inflammation in her hands at this time.  Primary osteoarthritis of both feet: She has no feet pain or joint swelling at this time.   DDD (degenerative disc disease), cervical: She has no discomfort at this time.  DDD (degenerative disc disease), thoracic: No discomfort   DDD (degenerative disc disease), lumbar: Chronic pain.    Osteopenia of multiple sites: She is taking vitamin D 2,000 units by mouth daily.   Vitamin D deficiency: She is taking vitamin D 2,000 units by mouth daily.   Other medical conditions are listed as follows:   Malignant neoplasm of upper-outer quadrant of right breast in female, estrogen receptor positive ([place])  History of hypothyroidism  History of lower GI bleeding  Hiatal hernia  H/O adenomatous polyp of colon  History of hyperlipidemia  History of gastroesophageal reflux (GERD)  History of asthma   Follow Up Instructions: She will follow up in 1 year   I discussed the assessment and treatment plan with the patient. The patient was provided an opportunity to ask questions and all were answered. The patient agreed with the plan and demonstrated an understanding of the instructions.   The patient was advised to call back or seek an in-person evaluation if the symptoms worsen or if the condition fails to improve as anticipated.  I provided 25 minutes of non-face-to-face time during this encounter.   Bo Merino, MD   Scribed by-  Hazel Sams, PA-C

## 2019-01-21 ENCOUNTER — Encounter: Payer: Self-pay | Admitting: Rheumatology

## 2019-01-21 ENCOUNTER — Telehealth (INDEPENDENT_AMBULATORY_CARE_PROVIDER_SITE_OTHER): Payer: Medicare Other | Admitting: Rheumatology

## 2019-01-21 ENCOUNTER — Other Ambulatory Visit: Payer: Self-pay

## 2019-01-21 DIAGNOSIS — C50411 Malignant neoplasm of upper-outer quadrant of right female breast: Secondary | ICD-10-CM | POA: Diagnosis not present

## 2019-01-21 DIAGNOSIS — M5030 Other cervical disc degeneration, unspecified cervical region: Secondary | ICD-10-CM | POA: Diagnosis not present

## 2019-01-21 DIAGNOSIS — M19071 Primary osteoarthritis, right ankle and foot: Secondary | ICD-10-CM | POA: Diagnosis not present

## 2019-01-21 DIAGNOSIS — Z8639 Personal history of other endocrine, nutritional and metabolic disease: Secondary | ICD-10-CM | POA: Diagnosis not present

## 2019-01-21 DIAGNOSIS — M5134 Other intervertebral disc degeneration, thoracic region: Secondary | ICD-10-CM

## 2019-01-21 DIAGNOSIS — M19041 Primary osteoarthritis, right hand: Secondary | ICD-10-CM

## 2019-01-21 DIAGNOSIS — M8589 Other specified disorders of bone density and structure, multiple sites: Secondary | ICD-10-CM

## 2019-01-21 DIAGNOSIS — R768 Other specified abnormal immunological findings in serum: Secondary | ICD-10-CM | POA: Diagnosis not present

## 2019-01-21 DIAGNOSIS — K754 Autoimmune hepatitis: Secondary | ICD-10-CM | POA: Diagnosis not present

## 2019-01-21 DIAGNOSIS — M5136 Other intervertebral disc degeneration, lumbar region: Secondary | ICD-10-CM

## 2019-01-21 DIAGNOSIS — Z79899 Other long term (current) drug therapy: Secondary | ICD-10-CM | POA: Diagnosis not present

## 2019-01-21 DIAGNOSIS — M51369 Other intervertebral disc degeneration, lumbar region without mention of lumbar back pain or lower extremity pain: Secondary | ICD-10-CM

## 2019-01-21 DIAGNOSIS — M19072 Primary osteoarthritis, left ankle and foot: Secondary | ICD-10-CM

## 2019-01-21 DIAGNOSIS — Z8719 Personal history of other diseases of the digestive system: Secondary | ICD-10-CM

## 2019-01-21 DIAGNOSIS — M19042 Primary osteoarthritis, left hand: Secondary | ICD-10-CM

## 2019-01-21 DIAGNOSIS — K449 Diaphragmatic hernia without obstruction or gangrene: Secondary | ICD-10-CM

## 2019-01-21 DIAGNOSIS — Z8709 Personal history of other diseases of the respiratory system: Secondary | ICD-10-CM

## 2019-01-21 DIAGNOSIS — E559 Vitamin D deficiency, unspecified: Secondary | ICD-10-CM

## 2019-01-21 DIAGNOSIS — Z170 Estrogen receptor positive status [ER+]: Secondary | ICD-10-CM

## 2019-01-21 DIAGNOSIS — Z86010 Personal history of colonic polyps: Secondary | ICD-10-CM

## 2019-01-21 DIAGNOSIS — Z860101 Personal history of adenomatous and serrated colon polyps: Secondary | ICD-10-CM

## 2019-01-26 ENCOUNTER — Other Ambulatory Visit: Payer: Self-pay

## 2019-01-26 ENCOUNTER — Other Ambulatory Visit: Payer: Medicare Other

## 2019-01-26 DIAGNOSIS — K754 Autoimmune hepatitis: Secondary | ICD-10-CM | POA: Diagnosis not present

## 2019-01-29 ENCOUNTER — Telehealth: Payer: Self-pay | Admitting: Family

## 2019-01-29 DIAGNOSIS — E038 Other specified hypothyroidism: Secondary | ICD-10-CM

## 2019-01-29 MED ORDER — LEVOTHYROXINE SODIUM 75 MCG PO TABS: ORAL_TABLET | ORAL | 0 refills | Status: DC

## 2019-01-29 NOTE — Telephone Encounter (Signed)
What is the name of the medication? Levothyroxide  Have you contacted your pharmacy to request a refill? Yes  Which pharmacy would you like this sent to? [facility]   Patient notified that their request is being sent to the clinical staff for review and that they should receive a call once it is complete. If they do not receive a call within 24 hours they can check with their pharmacy or our office.   [place] [facility]-90 day supply [facility]-30 day supply  Cheryl Perry' pt  She has been waiting on mailorder for 10 days & it hasn't come.  Please check on RX order & call her.  She needs today.

## 2019-01-29 NOTE — Telephone Encounter (Signed)
Pt aware [facility] and a [facility] day supply sent to [place].

## 2019-02-09 ENCOUNTER — Other Ambulatory Visit: Payer: Self-pay

## 2019-02-09 ENCOUNTER — Other Ambulatory Visit: Payer: Medicare Other

## 2019-02-09 DIAGNOSIS — C50911 Malignant neoplasm of unspecified site of right female breast: Secondary | ICD-10-CM | POA: Diagnosis not present

## 2019-02-09 DIAGNOSIS — K754 Autoimmune hepatitis: Secondary | ICD-10-CM | POA: Diagnosis not present

## 2019-02-20 DIAGNOSIS — Z23 Encounter for immunization: Secondary | ICD-10-CM | POA: Diagnosis not present

## 2019-02-21 ENCOUNTER — Other Ambulatory Visit: Payer: Self-pay | Admitting: Family

## 2019-02-21 DIAGNOSIS — E038 Other specified hypothyroidism: Secondary | ICD-10-CM

## 2019-02-23 ENCOUNTER — Other Ambulatory Visit: Payer: Self-pay

## 2019-02-23 ENCOUNTER — Other Ambulatory Visit: Payer: Medicare Other

## 2019-02-23 DIAGNOSIS — K754 Autoimmune hepatitis: Secondary | ICD-10-CM | POA: Diagnosis not present

## 2019-03-04 ENCOUNTER — Other Ambulatory Visit: Payer: Self-pay

## 2019-03-05 ENCOUNTER — Encounter: Payer: Self-pay | Admitting: Family

## 2019-03-05 ENCOUNTER — Ambulatory Visit (INDEPENDENT_AMBULATORY_CARE_PROVIDER_SITE_OTHER): Payer: Medicare Other | Admitting: Family

## 2019-03-05 ENCOUNTER — Other Ambulatory Visit: Payer: Self-pay

## 2019-03-05 VITALS — BP 124/77 | HR 60 | Temp 97.1°F | Ht 66.0 in | Wt 175.0 lb

## 2019-03-05 DIAGNOSIS — R7989 Other specified abnormal findings of blood chemistry: Secondary | ICD-10-CM | POA: Diagnosis not present

## 2019-03-05 DIAGNOSIS — M545 Low back pain, unspecified: Secondary | ICD-10-CM

## 2019-03-05 DIAGNOSIS — E785 Hyperlipidemia, unspecified: Secondary | ICD-10-CM

## 2019-03-05 DIAGNOSIS — J45909 Unspecified asthma, uncomplicated: Secondary | ICD-10-CM

## 2019-03-05 DIAGNOSIS — E038 Other specified hypothyroidism: Secondary | ICD-10-CM

## 2019-03-05 DIAGNOSIS — C50411 Malignant neoplasm of upper-outer quadrant of right female breast: Secondary | ICD-10-CM | POA: Diagnosis not present

## 2019-03-05 DIAGNOSIS — K754 Autoimmune hepatitis: Secondary | ICD-10-CM

## 2019-03-05 DIAGNOSIS — K219 Gastro-esophageal reflux disease without esophagitis: Secondary | ICD-10-CM | POA: Diagnosis not present

## 2019-03-05 DIAGNOSIS — G8929 Other chronic pain: Secondary | ICD-10-CM | POA: Diagnosis not present

## 2019-03-05 DIAGNOSIS — E663 Overweight: Secondary | ICD-10-CM

## 2019-03-05 DIAGNOSIS — Z170 Estrogen receptor positive status [ER+]: Secondary | ICD-10-CM

## 2019-03-05 MED ORDER — ALBUTEROL SULFATE HFA 108 (90 BASE) MCG/ACT IN AERS: 2.0000 | INHALATION_SPRAY | Freq: Four times a day (QID) | RESPIRATORY_TRACT | 3 refills | Status: DC | PRN

## 2019-03-05 NOTE — Progress Notes (Signed)
Subjective:    Patient ID: Cheryl Perry, female    DOB: [birth year], 74 y.o.   MRN: [id number]  Chief Complaint  Patient presents with  . Medical Management of Chronic Issues    BP has been elevated    Pt presents to the office today for chronic follow up.Pt had elevated liver enzymes and  had drug-induced hepatitis possibly related to Crestor and has been followed by GI every 2 months.She is currently taking prednisone 2.5 mg daily and hopefully will wean off over 2 weeks.    Pt is followed by allergen specialists annually for her asthma.PT is followed by Ortho as needed for chronic back pain.  She is followed by Oncologists every 6 months for right breast cancer. She completed radiation in 10/29/17.Had CT chest in 12/2018 and was good!  Asthma She complains of wheezing. There is no cough, difficulty breathing, shortness of breath or sputum production. This is a chronic problem. The current episode started more than 1 year ago. The problem occurs intermittently. The problem has been waxing and waning. Associated symptoms include heartburn, rhinorrhea and sneezing. Her symptoms are aggravated by exercise. Her symptoms are alleviated by beta-agonist. She reports minimal improvement on treatment. Her past medical history is significant for asthma.  Gastroesophageal Reflux She complains of belching, heartburn and wheezing. She reports no coughing. This is a chronic problem. The current episode started more than 1 year ago. The problem occurs rarely. The problem has been waxing and waning. Associated symptoms include fatigue. Risk factors include obesity. She has tried a PPI and an antacid for the symptoms. The treatment provided moderate relief.  Thyroid Problem Presents for follow-up visit. Symptoms include fatigue and hair loss. Patient reports no constipation or depressed mood. The symptoms have been stable. Her past medical history is significant for hyperlipidemia.   Hyperlipidemia This is a chronic problem. The current episode started more than 1 year ago. The problem is uncontrolled. Recent lipid tests were reviewed and are high. Exacerbating diseases include obesity. Pertinent negatives include no shortness of breath. Current antihyperlipidemic treatment includes diet change. The current treatment provides mild improvement of lipids. Risk factors for coronary artery disease include dyslipidemia, hypertension, a sedentary lifestyle and post-menopausal.  Back Pain This is a chronic problem. The current episode started more than 1 year ago. The problem occurs intermittently. The problem has been waxing and waning since onset. The pain is present in the lumbar spine. The pain is at a severity of 4/10. The pain is mild. The symptoms are aggravated by standing. She has tried NSAIDs and muscle relaxant for the symptoms. The treatment provided mild relief.      Review of Systems  Constitutional: Positive for fatigue.  HENT: Positive for rhinorrhea and sneezing.   Respiratory: Positive for wheezing. Negative for cough, sputum production and shortness of breath.   Gastrointestinal: Positive for heartburn. Negative for constipation.  Musculoskeletal: Positive for back pain.  All other systems reviewed and are negative.      Objective:   Physical Exam Vitals reviewed.  Constitutional:      General: She is not in acute distress.    Appearance: She is well-developed.  HENT:     Head: Normocephalic and atraumatic.     Right Ear: Tympanic membrane normal.     Left Ear: Tympanic membrane normal.  Eyes:     Pupils: Pupils are equal, round, and reactive to light.  Neck:     Thyroid: No thyromegaly.  Cardiovascular:  Rate and Rhythm: Normal rate and regular rhythm.     Heart sounds: Normal heart sounds. No murmur.  Pulmonary:     Effort: Pulmonary effort is normal. No respiratory distress.     Breath sounds: Normal breath sounds. No wheezing.   Abdominal:     General: Bowel sounds are normal. There is no distension.     Palpations: Abdomen is soft.     Tenderness: There is no abdominal tenderness.  Musculoskeletal:        General: No tenderness. Normal range of motion.     Cervical back: Normal range of motion and neck supple.  Skin:    General: Skin is warm and dry.  Neurological:     Mental Status: She is alert and oriented to person, place, and time.     Cranial Nerves: No cranial nerve deficit.     Deep Tendon Reflexes: Reflexes are normal and symmetric.  Psychiatric:        Behavior: Behavior normal.        Thought Content: Thought content normal.        Judgment: Judgment normal.       BP 124/77   Pulse 60   Temp (!) 97.1 F (36.2 C) (Temporal)   Ht 5' 6"  (1.676 m)   Wt 175 lb (79.4 kg)   SpO2 98%   BMI 28.25 kg/m      Assessment & Plan:  Cheryl Perry comes in today with chief complaint of Medical Management of Chronic Issues (BP has been elevated )   Diagnosis and orders addressed:  1. Uncomplicated asthma, unspecified asthma severity, unspecified whether persistent - albuterol (PROVENTIL HFA) 108 (90 Base) MCG/ACT inhaler; Inhale 2 puffs into the lungs every 6 (six) hours as needed for wheezing. May use only 3 times a month  Dispense: 18 g; Refill: 3 - CBC with Differential/Platelet - CMP14+EGFR  2. Autoimmune hepatitis ([place]) - CBC with Differential/Platelet - CMP14+EGFR  3. Gastroesophageal reflux disease without esophagitis - CBC with Differential/Platelet - CMP14+EGFR  4. Other specified hypothyroidism - CBC with Differential/Platelet - CMP14+EGFR - TSH  5. Chronic bilateral low back pain without sciatica - CBC with Differential/Platelet - CMP14+EGFR  6. Elevated liver function tests - CBC with Differential/Platelet - CMP14+EGFR  7. Hyperlipidemia, unspecified hyperlipidemia type - CBC with Differential/Platelet - CMP14+EGFR  8. Malignant neoplasm of upper-outer quadrant  of right breast in female, estrogen receptor positive ([place]) - CBC with Differential/Platelet - CMP14+EGFR  9. Overweight (BMI 25.0-29.9) - CBC with Differential/Platelet - CMP14+EGFR   Labs pending Health Maintenance reviewed Diet and exercise encouraged  Follow up plan: 6 months    [name], FNP

## 2019-03-05 NOTE — Patient Instructions (Signed)
Health Maintenance After Age 74 After age 74, you are at a higher risk for certain long-term diseases and infections as well as injuries from falls. Falls are a major cause of broken bones and head injuries in people who are older than age 74. Getting regular preventive care can help to keep you healthy and well. Preventive care includes getting regular testing and making lifestyle changes as recommended by your health care provider. Talk with your health care provider about:  Which screenings and tests you should have. A screening is a test that checks for a disease when you have no symptoms.  A diet and exercise plan that is right for you. What should I know about screenings and tests to prevent falls? Screening and testing are the best ways to find a health problem early. Early diagnosis and treatment give you the best chance of managing medical conditions that are common after age 74. Certain conditions and lifestyle choices may make you more likely to have a fall. Your health care provider may recommend:  Regular vision checks. Poor vision and conditions such as cataracts can make you more likely to have a fall. If you wear glasses, make sure to get your prescription updated if your vision changes.  Medicine review. Work with your health care provider to regularly review all of the medicines you are taking, including over-the-counter medicines. Ask your health care provider about any side effects that may make you more likely to have a fall. Tell your health care provider if any medicines that you take make you feel dizzy or sleepy.  Osteoporosis screening. Osteoporosis is a condition that causes the bones to get weaker. This can make the bones weak and cause them to break more easily.  Blood pressure screening. Blood pressure changes and medicines to control blood pressure can make you feel dizzy.  Strength and balance checks. Your health care provider may recommend certain tests to check your  strength and balance while standing, walking, or changing positions.  Foot health exam. Foot pain and numbness, as well as not wearing proper footwear, can make you more likely to have a fall.  Depression screening. You may be more likely to have a fall if you have a fear of falling, feel emotionally low, or feel unable to do activities that you used to do.  Alcohol use screening. Using too much alcohol can affect your balance and may make you more likely to have a fall. What actions can I take to lower my risk of falls? General instructions  Talk with your health care provider about your risks for falling. Tell your health care provider if: ? You fall. Be sure to tell your health care provider about all falls, even ones that seem minor. ? You feel dizzy, sleepy, or off-balance.  Take over-the-counter and prescription medicines only as told by your health care provider. These include any supplements.  Eat a healthy diet and maintain a healthy weight. A healthy diet includes low-fat dairy products, low-fat (lean) meats, and fiber from whole grains, beans, and lots of fruits and vegetables. Home safety  Remove any tripping hazards, such as rugs, cords, and clutter.  Install safety equipment such as grab bars in bathrooms and safety rails on stairs.  Keep rooms and walkways well-lit. Activity   Follow a regular exercise program to stay fit. This will help you maintain your balance. Ask your health care provider what types of exercise are appropriate for you.  If you need a cane or   walker, use it as recommended by your health care provider.  Wear supportive shoes that have nonskid soles. Lifestyle  Do not drink alcohol if your health care provider tells you not to drink.  If you drink alcohol, limit how much you have: ? 0-1 drink a day for women. ? 0-2 drinks a day for men.  Be aware of how much alcohol is in your drink. In the [place] one drink equals one typical bottle of beer (12  oz), one-half glass of wine (5 oz), or one shot of hard liquor (1 oz).  Do not use any products that contain nicotine or tobacco, such as cigarettes and e-cigarettes. If you need help quitting, ask your health care provider. Summary  Having a healthy lifestyle and getting preventive care can help to protect your health and wellness after age 74.  Screening and testing are the best way to find a health problem early and help you avoid having a fall. Early diagnosis and treatment give you the best chance for managing medical conditions that are more common for people who are older than age 74.  Falls are a major cause of broken bones and head injuries in people who are older than age 74. Take precautions to prevent a fall at home.  Work with your health care provider to learn what changes you can make to improve your health and wellness and to prevent falls. This information is not intended to replace advice given to you by your health care provider. Make sure you discuss any questions you have with your health care provider. Document Revised: 04/17/2018 Document Reviewed: 11/07/2016 Elsevier Patient Education  2020 [employer] Inc.  

## 2019-03-06 LAB — CMP14+EGFR
ALT: 24 IU/L (ref 0–32)
AST: 34 IU/L (ref 0–40)
Albumin/Globulin Ratio: 2.3 — ABNORMAL HIGH (ref 1.2–2.2)
Albumin: 4.4 g/dL (ref 3.7–4.7)
Alkaline Phosphatase: 90 IU/L (ref 39–117)
BUN/Creatinine Ratio: 18 (ref 12–28)
BUN: 15 mg/dL (ref 8–27)
Bilirubin Total: 0.4 mg/dL (ref 0.0–1.2)
CO2: 22 mmol/L (ref 20–29)
Calcium: 10 mg/dL (ref 8.7–10.3)
Chloride: 103 mmol/L (ref 96–106)
Creatinine, Ser: 0.83 mg/dL (ref 0.57–1.00)
GFR calc Af Amer: 81 mL/min/{1.73_m2} (ref >59)
GFR calc non Af Amer: 70 mL/min/{1.73_m2} (ref >59)
Globulin, Total: 1.9 g/dL (ref 1.5–4.5)
Glucose: 77 mg/dL (ref 65–99)
Potassium: 4.1 mmol/L (ref 3.5–5.2)
Sodium: 140 mmol/L (ref 134–144)
Total Protein: 6.3 g/dL (ref 6.0–8.5)

## 2019-03-06 LAB — TSH: TSH: 1.6 u[IU]/mL (ref 0.450–4.500)

## 2019-03-06 LAB — CBC WITH DIFFERENTIAL/PLATELET
Basophils Absolute: 0.1 10*3/uL (ref 0.0–0.2)
Basos: 1 %
EOS (ABSOLUTE): 0.1 10*3/uL (ref 0.0–0.4)
Eos: 2 %
Hematocrit: 45.8 % (ref 34.0–46.6)
Hemoglobin: 15.2 g/dL (ref 11.1–15.9)
Immature Grans (Abs): 0 10*3/uL (ref 0.0–0.1)
Immature Granulocytes: 0 %
Lymphocytes Absolute: 1.3 10*3/uL (ref 0.7–3.1)
Lymphs: 23 %
MCH: 31.8 pg (ref 26.6–33.0)
MCHC: 33.2 g/dL (ref 31.5–35.7)
MCV: 96 fL (ref 79–97)
Monocytes Absolute: 0.6 10*3/uL (ref 0.1–0.9)
Monocytes: 11 %
Neutrophils Absolute: 3.7 10*3/uL (ref 1.4–7.0)
Neutrophils: 63 %
Platelets: 213 10*3/uL (ref 150–450)
RBC: 4.78 x10E6/uL (ref 3.77–5.28)
RDW: 12 % (ref 11.7–15.4)
WBC: 5.8 10*3/uL (ref 3.4–10.8)

## 2019-03-09 ENCOUNTER — Other Ambulatory Visit: Payer: Self-pay

## 2019-03-09 ENCOUNTER — Other Ambulatory Visit: Payer: Self-pay | Admitting: Family Medicine

## 2019-03-09 ENCOUNTER — Other Ambulatory Visit: Payer: Medicare Other

## 2019-03-09 DIAGNOSIS — K754 Autoimmune hepatitis: Secondary | ICD-10-CM | POA: Diagnosis not present

## 2019-03-09 DIAGNOSIS — E038 Other specified hypothyroidism: Secondary | ICD-10-CM

## 2019-03-09 MED ORDER — LEVOTHYROXINE SODIUM 75 MCG PO TABS: ORAL_TABLET | ORAL | 3 refills | Status: DC

## 2019-03-20 DIAGNOSIS — K754 Autoimmune hepatitis: Secondary | ICD-10-CM | POA: Diagnosis not present

## 2019-03-21 DIAGNOSIS — Z23 Encounter for immunization: Secondary | ICD-10-CM | POA: Diagnosis not present

## 2019-03-23 ENCOUNTER — Other Ambulatory Visit: Payer: Medicare Other

## 2019-03-23 ENCOUNTER — Other Ambulatory Visit: Payer: Self-pay

## 2019-03-23 DIAGNOSIS — J4540 Moderate persistent asthma, uncomplicated: Secondary | ICD-10-CM | POA: Diagnosis not present

## 2019-03-23 DIAGNOSIS — J3089 Other allergic rhinitis: Secondary | ICD-10-CM | POA: Diagnosis not present

## 2019-03-23 DIAGNOSIS — J3081 Allergic rhinitis due to animal (cat) (dog) hair and dander: Secondary | ICD-10-CM | POA: Diagnosis not present

## 2019-03-23 DIAGNOSIS — K754 Autoimmune hepatitis: Secondary | ICD-10-CM | POA: Diagnosis not present

## 2019-03-23 DIAGNOSIS — J301 Allergic rhinitis due to pollen: Secondary | ICD-10-CM | POA: Diagnosis not present

## 2019-04-15 ENCOUNTER — Other Ambulatory Visit: Payer: Self-pay

## 2019-04-15 ENCOUNTER — Other Ambulatory Visit: Payer: Medicare Other

## 2019-04-15 DIAGNOSIS — K754 Autoimmune hepatitis: Secondary | ICD-10-CM | POA: Diagnosis not present

## 2019-04-23 DIAGNOSIS — K754 Autoimmune hepatitis: Secondary | ICD-10-CM | POA: Diagnosis not present

## 2019-04-29 ENCOUNTER — Other Ambulatory Visit: Payer: Self-pay

## 2019-04-29 ENCOUNTER — Other Ambulatory Visit: Payer: Medicare Other

## 2019-04-29 DIAGNOSIS — K754 Autoimmune hepatitis: Secondary | ICD-10-CM | POA: Diagnosis not present

## 2019-05-21 DIAGNOSIS — M5030 Other cervical disc degeneration, unspecified cervical region: Secondary | ICD-10-CM | POA: Diagnosis not present

## 2019-05-21 DIAGNOSIS — M5416 Radiculopathy, lumbar region: Secondary | ICD-10-CM | POA: Diagnosis not present

## 2019-05-21 DIAGNOSIS — M5136 Other intervertebral disc degeneration, lumbar region: Secondary | ICD-10-CM | POA: Diagnosis not present

## 2019-05-21 DIAGNOSIS — Z01419 Encounter for gynecological examination (general) (routine) without abnormal findings: Secondary | ICD-10-CM | POA: Diagnosis not present

## 2019-05-21 DIAGNOSIS — Z124 Encounter for screening for malignant neoplasm of cervix: Secondary | ICD-10-CM | POA: Diagnosis not present

## 2019-05-27 ENCOUNTER — Other Ambulatory Visit: Payer: Medicare Other

## 2019-05-27 ENCOUNTER — Other Ambulatory Visit: Payer: Self-pay

## 2019-05-27 DIAGNOSIS — K754 Autoimmune hepatitis: Secondary | ICD-10-CM | POA: Diagnosis not present

## 2019-06-01 ENCOUNTER — Telehealth: Payer: Self-pay | Admitting: Internal Medicine

## 2019-06-01 NOTE — Telephone Encounter (Signed)
Please schedule nurse visit

## 2019-06-01 NOTE — Telephone Encounter (Signed)
Pt called to say that she wanted to schedule a colonoscopy. She said her Liver Enzymes were better and she wanted to schedule where she had to cancel last year. Does she need OV or NV?  [phone]

## 2019-06-01 NOTE — Telephone Encounter (Signed)
Cheryl Perry, does she need an OV?

## 2019-06-01 NOTE — Telephone Encounter (Signed)
Can triage.

## 2019-06-02 NOTE — Telephone Encounter (Signed)
Pt is aware of her NV appt.

## 2019-06-11 DIAGNOSIS — M5030 Other cervical disc degeneration, unspecified cervical region: Secondary | ICD-10-CM | POA: Diagnosis not present

## 2019-06-11 DIAGNOSIS — M5136 Other intervertebral disc degeneration, lumbar region: Secondary | ICD-10-CM | POA: Diagnosis not present

## 2019-07-10 ENCOUNTER — Emergency Department (HOSPITAL_COMMUNITY)
Admission: EM | Admit: 2019-07-10 | Discharge: 2019-07-10 | Disposition: A | Discharge status: Home / Self Care | Payer: Medicare Other | Attending: Emergency Medicine | Admitting: Emergency Medicine

## 2019-07-10 ENCOUNTER — Encounter (HOSPITAL_COMMUNITY): Payer: Self-pay | Admitting: *Deleted

## 2019-07-10 ENCOUNTER — Emergency Department (HOSPITAL_COMMUNITY): Payer: Medicare Other

## 2019-07-10 ENCOUNTER — Other Ambulatory Visit: Payer: Self-pay

## 2019-07-10 DIAGNOSIS — S0990XA Unspecified injury of head, initial encounter: Secondary | ICD-10-CM

## 2019-07-10 DIAGNOSIS — Z87891 Personal history of nicotine dependence: Secondary | ICD-10-CM | POA: Insufficient documentation

## 2019-07-10 DIAGNOSIS — Z853 Personal history of malignant neoplasm of breast: Secondary | ICD-10-CM | POA: Insufficient documentation

## 2019-07-10 DIAGNOSIS — Z86010 Personal history of colonic polyps: Secondary | ICD-10-CM | POA: Insufficient documentation

## 2019-07-10 DIAGNOSIS — W19XXXA Unspecified fall, initial encounter: Secondary | ICD-10-CM

## 2019-07-10 DIAGNOSIS — J45909 Unspecified asthma, uncomplicated: Secondary | ICD-10-CM | POA: Insufficient documentation

## 2019-07-10 DIAGNOSIS — E039 Hypothyroidism, unspecified: Secondary | ICD-10-CM | POA: Diagnosis not present

## 2019-07-10 DIAGNOSIS — Z79890 Hormone replacement therapy (postmenopausal): Secondary | ICD-10-CM | POA: Diagnosis not present

## 2019-07-10 DIAGNOSIS — Y939 Activity, unspecified: Secondary | ICD-10-CM | POA: Insufficient documentation

## 2019-07-10 DIAGNOSIS — W01198A Fall on same level from slipping, tripping and stumbling with subsequent striking against other object, initial encounter: Secondary | ICD-10-CM | POA: Insufficient documentation

## 2019-07-10 DIAGNOSIS — Y929 Unspecified place or not applicable: Secondary | ICD-10-CM | POA: Diagnosis not present

## 2019-07-10 DIAGNOSIS — S0003XA Contusion of scalp, initial encounter: Secondary | ICD-10-CM | POA: Diagnosis not present

## 2019-07-10 DIAGNOSIS — Y999 Unspecified external cause status: Secondary | ICD-10-CM | POA: Insufficient documentation

## 2019-07-10 LAB — CBG MONITORING, ED: Glucose-Capillary: 120 mg/dL — ABNORMAL HIGH (ref 70–99)

## 2019-07-10 MED ORDER — ONDANSETRON 4 MG PO TBDP
4.0000 mg | ORAL_TABLET | Freq: Three times a day (TID) | ORAL | 0 refills | Status: DC | PRN
Start: 2019-07-10 — End: 2020-04-05

## 2019-07-10 MED ORDER — ONDANSETRON 4 MG PO TBDP
4.0000 mg | ORAL_TABLET | Freq: Once | ORAL | Status: AC
  Administered 2019-07-10: 4 mg via ORAL
  Filled 2019-07-10: qty 1

## 2019-07-10 NOTE — ED Triage Notes (Signed)
Slipped up and fell, hit head on asphalt. Denies LOC

## 2019-07-10 NOTE — ED Notes (Signed)
EDP at bedside  

## 2019-07-10 NOTE — ED Notes (Signed)
Pt given ice pack for comfort.

## 2019-07-10 NOTE — ED Provider Notes (Signed)
[facility] EMERGENCY DEPARTMENT Provider Note   CSN: [id number] Arrival date & time: 07/10/19  1335     History Chief Complaint  Patient presents with  . Head Injury  . Fall    Cheryl Perry is a 74 y.o. female.   Head Injury Location:  Occipital Mechanism of injury: fall   Fall:    Fall occurred:  Standing   Impact surface:  Product manager of impact:  Head   Entrapped after fall: no   Pain details:    Quality:  Aching   Severity:  Moderate   Timing:  Constant   Progression:  Unchanged Chronicity:  New Relieved by:  Nothing Worsened by:  Nothing Ineffective treatments:  None tried Associated symptoms: headache, nausea and tinnitus   Associated symptoms: no blurred vision, no difficulty breathing, no disorientation, no double vision, no focal weakness, no hearing loss, no loss of consciousness, no neck pain, no numbness, no seizures and no vomiting   Fall Associated symptoms include headaches. Pertinent negatives include no chest pain, no abdominal pain and no shortness of breath.       Past Medical History:  Diagnosis Date  . Allergy   . Arthritis    back painherniated disc lumbar  . Asthma   . Autoimmune hepatitis ([place])   . Back pain   . Cancer ([place]) 07/2017   right breast cancer, lumpectomy and 20 sessions of XRT  . Complication of anesthesia   . GERD (gastroesophageal reflux disease)   . Hiatal hernia   . Hyperlipidemia   . Hypothyroidism   . Osteopenia   . PONV (postoperative nausea and vomiting)    asks for scop patch  . Thyroid disease   . Vasculopathy    lymphatic    Patient Active Problem List   Diagnosis Date Noted  . Autoimmune hepatitis ([place]) 01/12/2019  . Elevated liver function tests 12/25/2017  . H/O adenomatous polyp of colon 12/12/2017  . Malignant neoplasm of upper-outer quadrant of right breast in female, estrogen receptor positive ([place]) 07/12/2017  . Back pain 12/14/2016  . Overweight (BMI 25.0-29.9) 06/14/2015  .  Gastric ulceration   . Hiatal hernia   . History of lower GI bleeding   . UGI bleed 01/27/2015  . Vitamin D deficiency 06/15/2014  . GERD (gastroesophageal reflux disease) 06/15/2014  . Osteopenia 09/02/2013  . Hypothyroidism 06/05/2012  . HLD (hyperlipidemia) 06/05/2012  . Asthma 06/05/2012    Past Surgical History:  Procedure Laterality Date  . BREAST LUMPECTOMY WITH RADIOACTIVE SEED AND SENTINEL LYMPH NODE BIOPSY Right 08/22/2017   Procedure: BREAST LUMPECTOMY WITH RADIOACTIVE SEED AND SENTINEL LYMPH NODE BIOPSY;  Surgeon: Erroll Luna, MD;  Location: [facility];  Service: General;  Laterality: Right;  . COLONOSCOPY  2015   Dr. Earlean Shawl: Hemorrhoids, diverticulosis.  Next colonoscopy in 5 years due to previous history of adenomatous colon polyps  . CYSTOSCOPY    . cystscopy    . ESOPHAGOGASTRODUODENOSCOPY N/A 01/28/2015   Dr.Rourk- 3 small prepyloric/gastric ulcers- likely culprits causing bleeding. hiatal hernia, small gastic polyps not manipulated.  . ESOPHAGOGASTRODUODENOSCOPY N/A 05/26/2015   Dr. Gala Romney: Small hiatal hernia, previous gastric ulcers completely healed.  Marland Kitchen KNEE SURGERY    . LIVER BIOPSY  10/09/2018  . TONSILLECTOMY    . TUBAL LIGATION       OB History   No obstetric history on file.     Family History  Problem Relation Age of Onset  . Stroke Mother   .  Hypertension Mother   . Alzheimer's disease Mother   . Heart disease Sister   . Hypertension Sister   . Heart attack Sister        Psychologist, forensic   . Arthritis Daughter        very bad MVA   . Hypertension Son   . Seizures Son   . Colon cancer Neg Hx   . Liver cancer Neg Hx   . Pancreatic disease Neg Hx     Social History   Tobacco Use  . Smoking status: Former Smoker    Quit date: 06/06/1974    Years since quitting: 45.1  . Smokeless tobacco: Never Used  . Tobacco comment: Quit x 40 years ago  Vaping Use  . Vaping Use: Never used  Substance Use Topics  . Alcohol use: No     Alcohol/week: 0.0 standard drinks  . Drug use: No    Home Medications Prior to Admission medications   Medication Sig Start Date End Date Taking? Authorizing Provider  albuterol (PROVENTIL HFA) 108 (90 Base) MCG/ACT inhaler Inhale 2 puffs into the lungs every 6 (six) hours as needed for wheezing. May use only 3 times a month 03/05/19   [name] A, FNP  Calcium Carbonate-Vitamin D 600-400 MG-UNIT tablet Take by mouth.    [provider]  Cholecalciferol (VITAMIN D) 2000 UNITS CAPS Take 2,000 Units by mouth daily.     [provider]  fluticasone-salmeterol (ADVAIR HFA) [phone] MCG/ACT inhaler Inhale 2 puffs into the lungs 2 (two) times daily. 12/14/14   Sharion Balloon, FNP  levothyroxine (SYNTHROID) 75 MCG tablet TAKE 1 TABLET (75 MCG TOTAL)  BY MOUTH DAILY BEFORE BREAKFAST. 03/09/19   Hawks, Christy A, FNP  loratadine (CLARITIN) 10 MG tablet Take 10 mg by mouth daily.    [provider]  methocarbamol (ROBAXIN) 750 MG tablet Take 375 mg by mouth every 6 (six) hours as needed for muscle spasms.  09/27/17   [provider]  mycophenolate (CELLCEPT) 500 MG tablet Take by mouth 2 (two) times daily.    [provider]  ondansetron (ZOFRAN ODT) 4 MG disintegrating tablet Take 1 tablet (4 mg total) by mouth every 8 (eight) hours as needed for up to 10 doses for nausea or vomiting. 07/10/19   Breck Coons, MD  pantoprazole (PROTONIX) 40 MG tablet Take 1 tablet (40 mg total) by mouth daily before breakfast. 06/17/18   Annitta Needs, NP  predniSONE (DELTASONE) 2.5 MG tablet Take 2.5 mg by mouth daily with breakfast.    [provider]    Allergies    Azathioprine, Other, Statins, Sulfa antibiotics, Naproxen, Lipitor [atorvastatin], Premarin [conjugated estrogens], Ampicillin, Avelox [moxifloxacin hcl in nacl], Azithromycin, Bactrim [sulfamethoxazole-trimethoprim], Crestor [rosuvastatin calcium], Doxycycline, Levaquin [levofloxacin in d5w], Livalo  [pitavastatin], Macrodantin [nitrofurantoin macrocrystal], and Trental [pentoxifylline er]  Review of Systems   Review of Systems  Constitutional: Negative for chills and fever.  HENT: Positive for tinnitus. Negative for ear pain, hearing loss and sore throat.   Eyes: Negative for blurred vision, double vision, photophobia and visual disturbance.  Respiratory: Negative for cough and shortness of breath.   Cardiovascular: Negative for chest pain.  Gastrointestinal: Positive for nausea. Negative for abdominal pain and vomiting.  Genitourinary: Negative for dysuria and hematuria.  Musculoskeletal: Negative for arthralgias, back pain, neck pain and neck stiffness.  Skin: Negative for color change and rash.  Neurological: Positive for headaches. Negative for dizziness, focal weakness, seizures, loss of consciousness, syncope,  light-headedness and numbness.  Hematological: Does not bruise/bleed easily.  All other systems reviewed and are negative.   Physical Exam Updated Vital Signs BP (!) 174/90   Pulse 72   Temp 97.9 F (36.6 C)   Resp 18   SpO2 99%   Physical Exam Vitals and nursing note reviewed. Exam conducted with a chaperone present.  Constitutional:      General: She is not in acute distress.    Appearance: She is well-developed.  HENT:     Head: Normocephalic.   Eyes:     Conjunctiva/sclera: Conjunctivae normal.  Cardiovascular:     Rate and Rhythm: Normal rate and regular rhythm.     Heart sounds: No murmur heard.   Pulmonary:     Effort: Pulmonary effort is normal. No respiratory distress.     Breath sounds: Normal breath sounds.  Abdominal:     Palpations: Abdomen is soft.     Tenderness: There is no abdominal tenderness.  Musculoskeletal:     Cervical back: Normal range of motion and neck supple. No signs of trauma or rigidity. No spinous process tenderness. Normal range of motion.  Skin:    General: Skin is warm and dry.  Neurological:     Mental Status:  She is alert.     GCS: GCS eye subscore is 4. GCS verbal subscore is 5. GCS motor subscore is 6.     Comments: 5 out of 5 motor strength in all extremities, sensation intact throughout, no dysmetria, no dysdiadochokinesia, no ataxia with ambulation, cranial nerves II through XII intact, alert and oriented to person place and time      ED Results / Procedures / Treatments   Labs (all labs ordered are listed, but only abnormal results are displayed) Labs Reviewed  CBG MONITORING, ED - Abnormal; Notable for the following components:      Result Value   Glucose-Capillary 120 (*)    All other components within normal limits    EKG None  Radiology CT Head Wo Contrast  Result Date: 07/10/2019 CLINICAL DATA:  Head trauma, intracranial venous injury suspected. Additional history provided: Slip and fall, hitting back of head on asphalt, history of breast cancer. EXAM: CT HEAD WITHOUT CONTRAST TECHNIQUE: Contiguous axial images were obtained from the base of the skull through the vertex without intravenous contrast. COMPARISON:  No pertinent prior studies available for comparison. FINDINGS: Brain: Cerebral volume is normal for age. There is no acute intracranial hemorrhage. No demarcated cortical infarct. No extra-axial fluid collection. No evidence of intracranial mass on this noncontrast examination. No midline shift. Vascular: No hyperdense vessel. Skull: Normal. Negative for fracture or focal lesion. Sinuses/Orbits: Visualized orbits show no acute finding. Mild ethmoid sinus mucosal thickening. Small bilateral maxillary sinus mucous retention cysts. No significant mastoid effusion. Other: Large posterior parietal scalp hematoma. IMPRESSION: No evidence of acute intracranial abnormality. Large posterior parietal scalp hematoma. Mild ethmoid sinus mucosal thickening. Small bilateral maxillary sinus mucous retention cysts. Electronically Signed   By: Kellie Simmering DO   On: 07/10/2019 15:00     Procedures Procedures (including critical care time)  Medications Ordered in ED Medications  ondansetron (ZOFRAN-ODT) disintegrating tablet 4 mg (4 mg Oral Given 07/10/19 1417)    ED Course  I have reviewed the triage vital signs and the nursing notes.  Pertinent labs & imaging results that were available during my care of the patient were reviewed by me and considered in my medical decision making (see chart for details).  MDM Rules/Calculators/A&P                          74 year old female fell from standing, hit concrete, no loss of consciousness, no open wounds, no neurologic dysfunction.  No blood thinners.  We will get a CT scan of her head to evaluate for intracranial injury or fracture.  She has no midline tenderness to her neck, she has full range of motion, she has no distracting injuries. She is given Zofran for mild nausea, she is offered pain medication but due to allergies and her not wanting narcotics she will not be given any, she understands this and is okay with that plan.  Patient CT scan is reviewed by myself and radiology there is a large hematoma however there appears to be no acute intracranial abnormality, the patient is reassessed and is feeling fine.  She is offered more pain medication and .  She is safe for discharge home with outpatient follow-up.  Concussion type symptoms are reviewed and return precautions are given.  Her vital signs remained stable and she is comfortable with the plan with outpatient follow-up.    Final Clinical Impression(s) / ED Diagnoses Final diagnoses:  Injury of head, initial encounter  Scalp hematoma, initial encounter  Fall, initial encounter    Rx / DC Orders ED Discharge Orders         Ordered    ondansetron (ZOFRAN ODT) 4 MG disintegrating tablet  Every 8 hours PRN     Discontinue  Reprint     07/10/19 1515           Breck Coons, MD 07/10/19 1517

## 2019-07-10 NOTE — ED Notes (Signed)
Pt transported to CT ?

## 2019-07-24 ENCOUNTER — Other Ambulatory Visit: Payer: Self-pay

## 2019-07-24 ENCOUNTER — Other Ambulatory Visit: Payer: Medicare Other

## 2019-07-24 DIAGNOSIS — J4540 Moderate persistent asthma, uncomplicated: Secondary | ICD-10-CM | POA: Diagnosis not present

## 2019-07-24 DIAGNOSIS — J3089 Other allergic rhinitis: Secondary | ICD-10-CM | POA: Diagnosis not present

## 2019-07-24 DIAGNOSIS — J301 Allergic rhinitis due to pollen: Secondary | ICD-10-CM | POA: Diagnosis not present

## 2019-07-24 DIAGNOSIS — J3081 Allergic rhinitis due to animal (cat) (dog) hair and dander: Secondary | ICD-10-CM | POA: Diagnosis not present

## 2019-07-27 ENCOUNTER — Other Ambulatory Visit: Payer: Self-pay

## 2019-07-27 ENCOUNTER — Other Ambulatory Visit: Payer: Medicare Other

## 2019-07-27 DIAGNOSIS — K754 Autoimmune hepatitis: Secondary | ICD-10-CM | POA: Diagnosis not present

## 2019-08-10 IMAGING — US US BIOPSY CORE LIVER
1 series · 8 of 8 positions shown · non-contrast
Comparison: none

INDICATION: Elevated liver function test

[Series 1: us biopsy core liver · 8 of 8 slices shown]
[im 1/8]
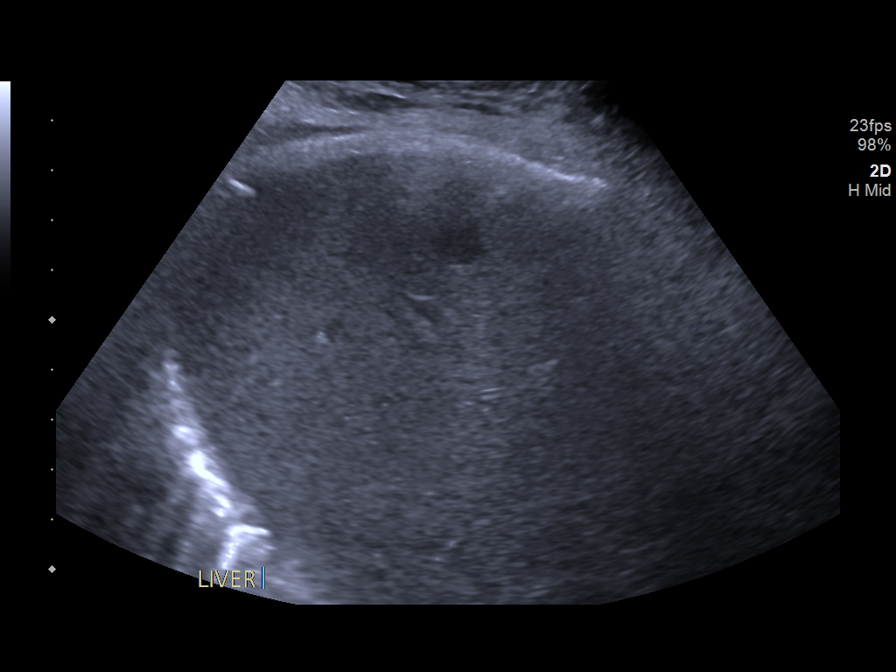
[im 2/8]
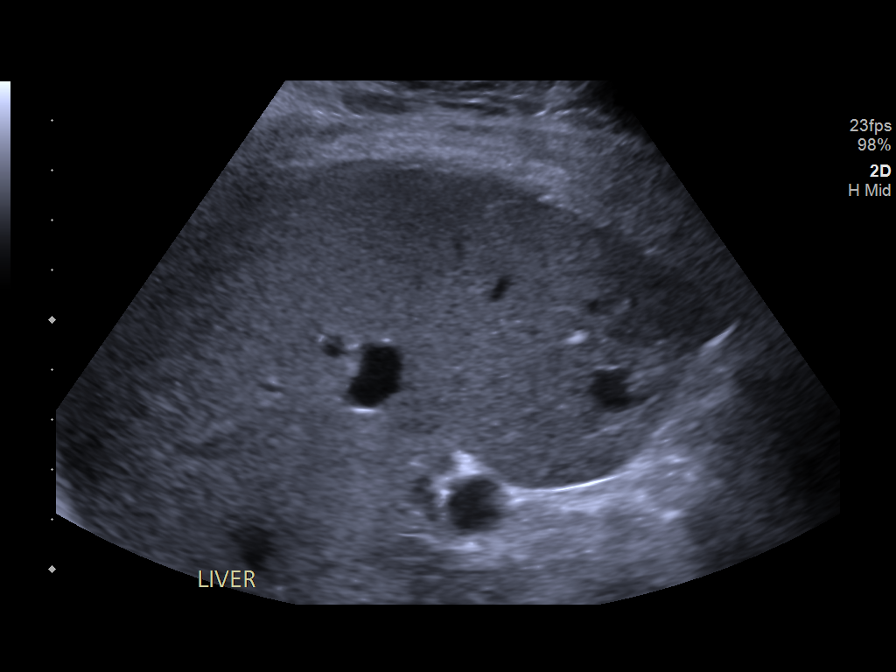
[im 3/8]
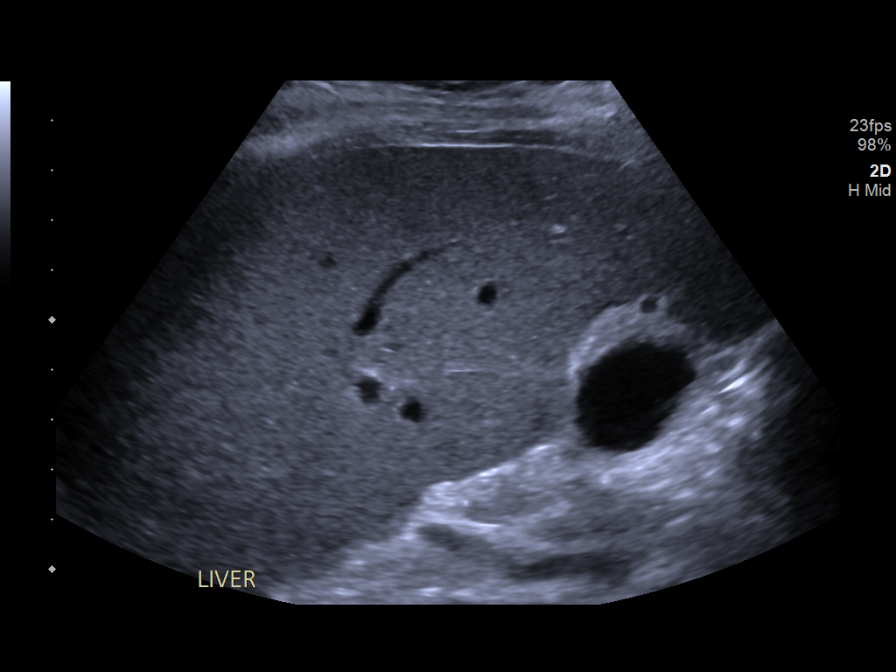
[im 4/8]
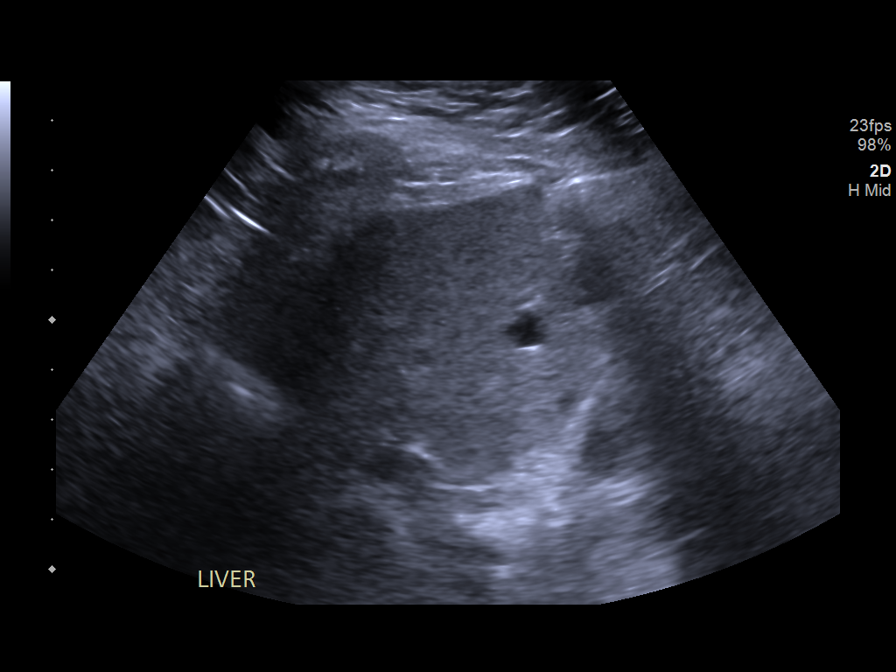
[im 5/8]
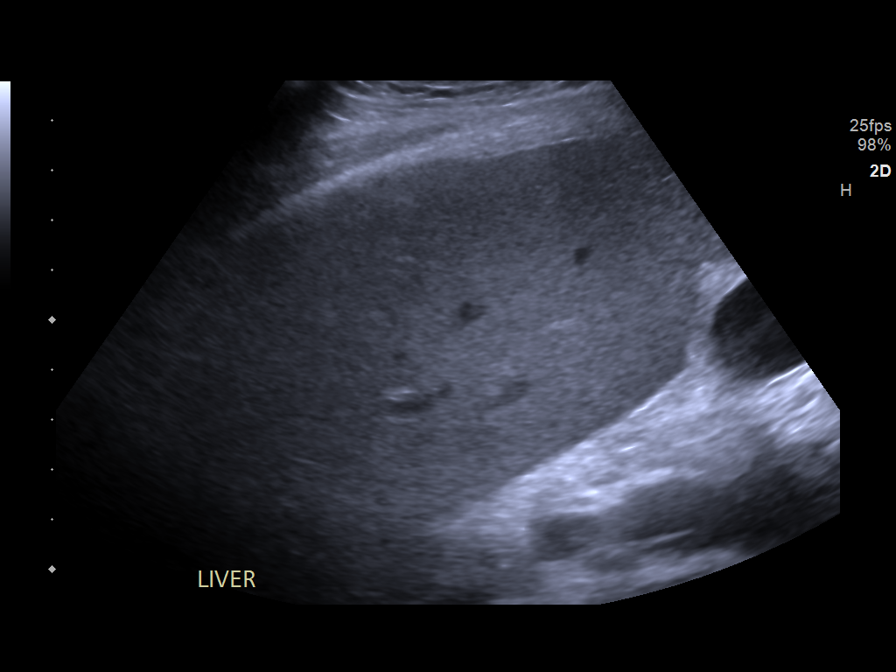
[im 6/8]
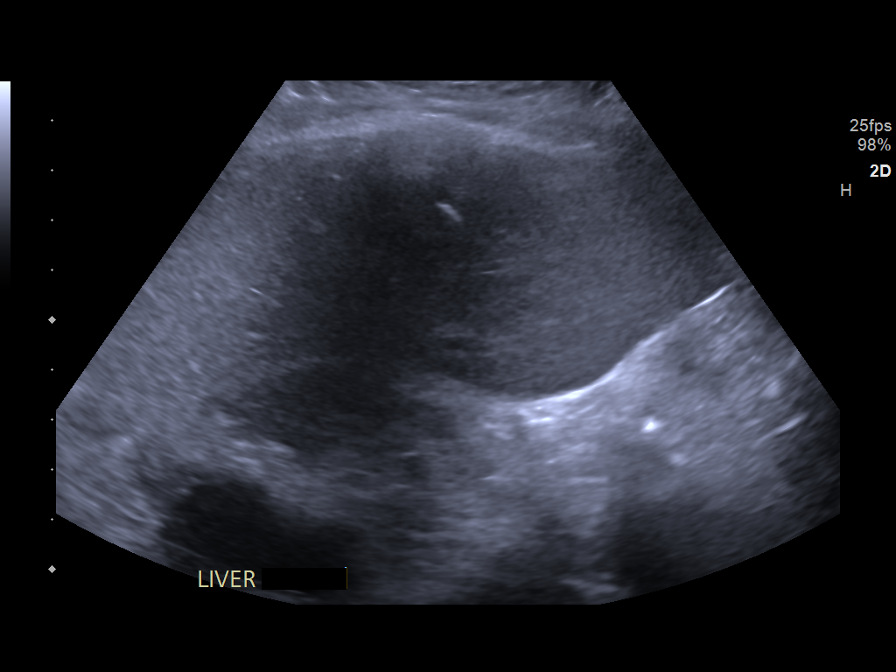
[im 7/8]
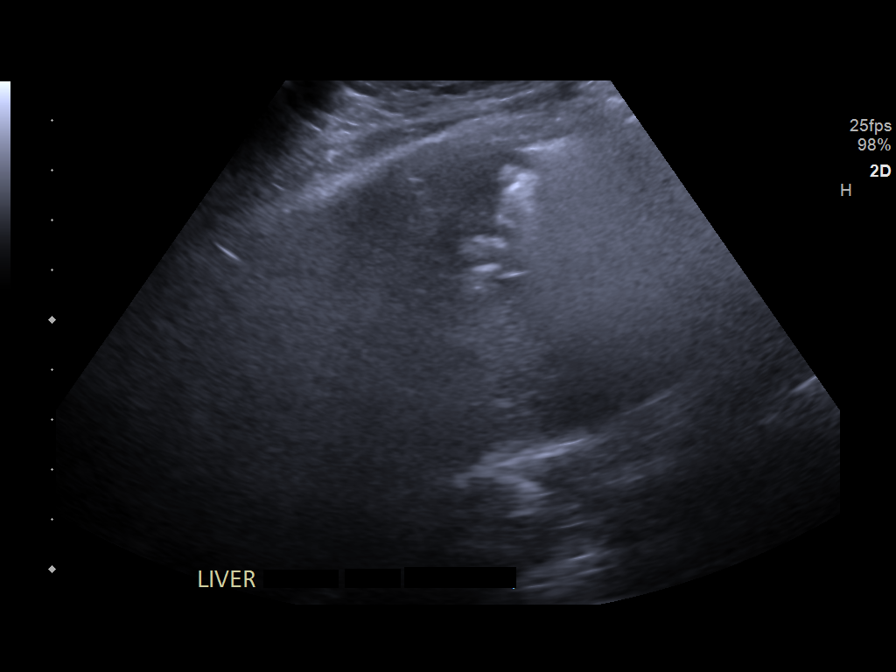
[im 8/8]
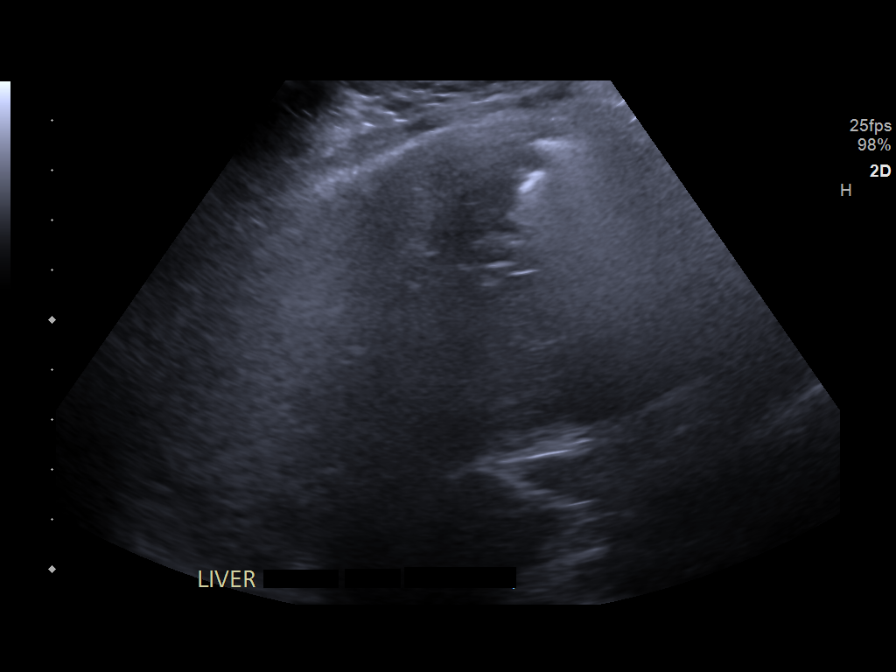

[8 of 8 positions shown; findings below may reference images not displayed]

EXAM:
ULTRASOUND-GUIDED RANDOM LIVER CORE BIOPSY

MEDICATIONS:
None.

ANESTHESIA/SEDATION:
Fentanyl 75 mcg IV; Versed 1 mg IV

Moderate Sedation Time:  8 minutes

The patient was continuously monitored during the procedure by the
interventional radiology nurse under my direct supervision.

FLUOROSCOPY TIME:  Fluoroscopy Time:  minutes  seconds ( mGy).

COMPLICATIONS:
None immediate.

PROCEDURE:
Informed written consent was obtained from the patient after a
thorough discussion of the procedural risks, benefits and
alternatives. All questions were addressed. Maximal Sterile Barrier
Technique was utilized including caps, mask, sterile gowns, sterile
gloves, sterile drape, hand hygiene and skin antiseptic. A timeout
was performed prior to the initiation of the procedure.

The right flank was prepped with ChloraPrep in a sterile fashion,
and a sterile drape was applied covering the operative field. A
sterile gown and sterile gloves were used for the procedure.

Under sonographic guidance, an 17 gauge guide needle was advanced
into the right lobe of the liver. Subsequently, 3 18 gauge core
biopsies were obtained. The guide needle was removed. Final imaging
was performed.

Patient tolerated the procedure well without complication. Vital
sign monitoring by nursing staff during the procedure will continue
as patient is in the special procedures unit for post procedure
observation.
FINDINGS: The images document guide needle placement within the right lobe of
the liver. Post biopsy images demonstrate no hemorrhage.
IMPRESSION: Successful ultrasound-guided random core biopsy from the right lobe
of the liver.

## 2019-08-17 ENCOUNTER — Ambulatory Visit: Payer: Medicare Other

## 2019-09-03 ENCOUNTER — Ambulatory Visit (INDEPENDENT_AMBULATORY_CARE_PROVIDER_SITE_OTHER): Payer: Medicare Other | Admitting: Family

## 2019-09-03 ENCOUNTER — Encounter: Payer: Self-pay | Admitting: Family

## 2019-09-03 VITALS — BP 116/79 | HR 58 | Temp 97.4°F | Ht 66.0 in | Wt 171.2 lb

## 2019-09-03 DIAGNOSIS — R7989 Other specified abnormal findings of blood chemistry: Secondary | ICD-10-CM | POA: Diagnosis not present

## 2019-09-03 DIAGNOSIS — J45909 Unspecified asthma, uncomplicated: Secondary | ICD-10-CM

## 2019-09-03 DIAGNOSIS — K219 Gastro-esophageal reflux disease without esophagitis: Secondary | ICD-10-CM

## 2019-09-03 DIAGNOSIS — E038 Other specified hypothyroidism: Secondary | ICD-10-CM | POA: Diagnosis not present

## 2019-09-03 DIAGNOSIS — E785 Hyperlipidemia, unspecified: Secondary | ICD-10-CM

## 2019-09-03 DIAGNOSIS — E663 Overweight: Secondary | ICD-10-CM

## 2019-09-03 DIAGNOSIS — M545 Low back pain: Secondary | ICD-10-CM | POA: Diagnosis not present

## 2019-09-03 DIAGNOSIS — Z8719 Personal history of other diseases of the digestive system: Secondary | ICD-10-CM | POA: Diagnosis not present

## 2019-09-03 DIAGNOSIS — G8929 Other chronic pain: Secondary | ICD-10-CM | POA: Diagnosis not present

## 2019-09-03 NOTE — Patient Instructions (Signed)
Health Maintenance, Female Adopting a healthy lifestyle and getting preventive care are important in promoting health and wellness. Ask your health care provider about:  The right schedule for you to have regular tests and exams.  Things you can do on your own to prevent diseases and keep yourself healthy. What should I know about diet, weight, and exercise? Eat a healthy diet   Eat a diet that includes plenty of vegetables, fruits, low-fat dairy products, and lean protein.  Do not eat a lot of foods that are high in solid fats, added sugars, or sodium. Maintain a healthy weight Body mass index (BMI) is used to identify weight problems. It estimates body fat based on height and weight. Your health care provider can help determine your BMI and help you achieve or maintain a healthy weight. Get regular exercise Get regular exercise. This is one of the most important things you can do for your health. Most adults should:  Exercise for at least 150 minutes each week. The exercise should increase your heart rate and make you sweat (moderate-intensity exercise).  Do strengthening exercises at least twice a week. This is in addition to the moderate-intensity exercise.  Spend less time sitting. Even light physical activity can be beneficial. Watch cholesterol and blood lipids Have your blood tested for lipids and cholesterol at 74 years of age, then have this test every 5 years. Have your cholesterol levels checked more often if:  Your lipid or cholesterol levels are high.  You are older than 74 years of age.  You are at high risk for heart disease. What should I know about cancer screening? Depending on your health history and family history, you may need to have cancer screening at various ages. This may include screening for:  Breast cancer.  Cervical cancer.  Colorectal cancer.  Skin cancer.  Lung cancer. What should I know about heart disease, diabetes, and high blood  pressure? Blood pressure and heart disease  High blood pressure causes heart disease and increases the risk of stroke. This is more likely to develop in people who have high blood pressure readings, are of African descent, or are overweight.  Have your blood pressure checked: ? Every 3-5 years if you are 18-39 years of age. ? Every year if you are 40 years old or older. Diabetes Have regular diabetes screenings. This checks your fasting blood sugar level. Have the screening done:  Once every three years after age 40 if you are at a normal weight and have a low risk for diabetes.  More often and at a younger age if you are overweight or have a high risk for diabetes. What should I know about preventing infection? Hepatitis B If you have a higher risk for hepatitis B, you should be screened for this virus. Talk with your health care provider to find out if you are at risk for hepatitis B infection. Hepatitis C Testing is recommended for:  Everyone born from 1945 through 1965.  Anyone with known risk factors for hepatitis C. Sexually transmitted infections (STIs)  Get screened for STIs, including gonorrhea and chlamydia, if: ? You are sexually active and are younger than 74 years of age. ? You are older than 74 years of age and your health care provider tells you that you are at risk for this type of infection. ? Your sexual activity has changed since you were last screened, and you are at increased risk for chlamydia or gonorrhea. Ask your health care provider if   you are at risk.  Ask your health care provider about whether you are at high risk for HIV. Your health care provider may recommend a prescription medicine to help prevent HIV infection. If you choose to take medicine to prevent HIV, you should first get tested for HIV. You should then be tested every 3 months for as long as you are taking the medicine. Pregnancy  If you are about to stop having your period (premenopausal) and  you may become pregnant, seek counseling before you get pregnant.  Take 400 to 800 micrograms (mcg) of folic acid every day if you become pregnant.  Ask for birth control (contraception) if you want to prevent pregnancy. Osteoporosis and menopause Osteoporosis is a disease in which the bones lose minerals and strength with aging. This can result in bone fractures. If you are 65 years old or older, or if you are at risk for osteoporosis and fractures, ask your health care provider if you should:  Be screened for bone loss.  Take a calcium or vitamin D supplement to lower your risk of fractures.  Be given hormone replacement therapy (HRT) to treat symptoms of menopause. Follow these instructions at home: Lifestyle  Do not use any products that contain nicotine or tobacco, such as cigarettes, e-cigarettes, and chewing tobacco. If you need help quitting, ask your health care provider.  Do not use street drugs.  Do not share needles.  Ask your health care provider for help if you need support or information about quitting drugs. Alcohol use  Do not drink alcohol if: ? Your health care provider tells you not to drink. ? You are pregnant, may be pregnant, or are planning to become pregnant.  If you drink alcohol: ? Limit how much you use to 0-1 drink a day. ? Limit intake if you are breastfeeding.  Be aware of how much alcohol is in your drink. In the [place] one drink equals one 12 oz bottle of beer (355 mL), one 5 oz glass of wine (148 mL), or one 1 oz glass of hard liquor (44 mL). General instructions  Schedule regular health, dental, and eye exams.  Stay current with your vaccines.  Tell your health care provider if: ? You often feel depressed. ? You have ever been abused or do not feel safe at home. Summary  Adopting a healthy lifestyle and getting preventive care are important in promoting health and wellness.  Follow your health care provider's instructions about healthy  diet, exercising, and getting tested or screened for diseases.  Follow your health care provider's instructions on monitoring your cholesterol and blood pressure. This information is not intended to replace advice given to you by your health care provider. Make sure you discuss any questions you have with your health care provider. Document Revised: 12/18/2017 Document Reviewed: 12/18/2017 Elsevier Patient Education  2020 Elsevier Inc.  

## 2019-09-03 NOTE — Progress Notes (Signed)
Subjective:    Patient ID: Cheryl Perry, female    DOB: [birth year], 74 y.o.   MRN: [id number]  Chief Complaint  Patient presents with  . Medical Management of Chronic Issues    not fasting,   . Asthma  . 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   Pt is followed by allergen specialists annually for her asthma.PT is followed by Ortho as needed for chronic back pain.  She is followed by Oncologists every 6 months for right breast cancer. She completed radiation in 10/29/17.Had CT chest in 12/2018 and was good!  She slipped on some grass about six weeks ago and hit her head. She states she had a negative CT head and has been doing well.  Asthma She complains of cough and wheezing. This is a chronic problem. The current episode started more than 1 year ago. The problem occurs intermittently. The problem has been waxing and waning. Associated symptoms include heartburn and malaise/fatigue. Pertinent negatives include no dyspnea on exertion or ear congestion. She reports significant improvement on treatment. Her past medical history is significant for asthma.  Gastroesophageal Reflux She complains of belching, coughing, heartburn and wheezing. This is a chronic problem. The current episode started more than 1 year ago. The problem occurs occasionally. Risk factors include obesity. She has tried a PPI for the symptoms. The treatment provided moderate relief.  Thyroid Problem Presents for follow-up visit. Patient reports no anxiety or depressed mood. The symptoms have been stable. Her past medical history is significant for hyperlipidemia.  Hyperlipidemia This is a chronic problem. The current episode started more than 1 year ago. The problem is uncontrolled. Recent  lipid tests were reviewed and are high. She is currently on no antihyperlipidemic treatment. The current treatment provides moderate improvement of lipids. Risk factors for coronary artery disease include hypertension, a sedentary lifestyle, post-menopausal and dyslipidemia.  Back Pain This is a chronic problem. The current episode started more than 1 year ago. The problem occurs intermittently. The problem has been waxing and waning since onset. The pain is present in the lumbar spine. The quality of the pain is described as aching. The pain is at a severity of 4/10. The pain is moderate.      Review of Systems  Constitutional: Positive for malaise/fatigue.  Respiratory: Positive for cough and wheezing.   Cardiovascular: Negative for dyspnea on exertion.  Gastrointestinal: Positive for heartburn.  Musculoskeletal: Positive for back pain.  Psychiatric/Behavioral: The patient is not nervous/anxious.   All other systems reviewed and are negative.      Objective:   Physical Exam Vitals reviewed.  Constitutional:      General: She is not in acute distress.    Appearance: She is well-developed.  HENT:     Head: Normocephalic and atraumatic.     Right Ear: Tympanic membrane normal.     Left Ear: Tympanic membrane normal.  Eyes:     Pupils: Pupils are equal, round, and reactive to light.  Neck:     Thyroid: No thyromegaly.  Cardiovascular:     Rate and Rhythm: Normal rate and regular rhythm.     Heart sounds: Normal heart sounds. No murmur heard.   Pulmonary:     Effort: Pulmonary effort is normal. No respiratory distress.  Breath sounds: Normal breath sounds. No wheezing.  Abdominal:     General: Bowel sounds are normal. There is no distension.     Palpations: Abdomen is soft.     Tenderness: There is no abdominal tenderness.  Musculoskeletal:        General: No tenderness. Normal range of motion.     Cervical back: Normal range of motion and neck supple.  Skin:     General: Skin is warm and dry.  Neurological:     Mental Status: She is alert and oriented to person, place, and time.     Cranial Nerves: No cranial nerve deficit.     Deep Tendon Reflexes: Reflexes are normal and symmetric.  Psychiatric:        Behavior: Behavior normal.        Thought Content: Thought content normal.        Judgment: Judgment normal.       BP 116/79   Pulse (!) 58   Temp (!) 97.4 F (36.3 C) (Temporal)   Ht 5' 6"  (1.676 m)   Wt 171 lb 3.2 oz (77.7 kg)   SpO2 97%   BMI 27.63 kg/m      Assessment & Plan:  Cheryl Perry comes in today with chief complaint of Medical Management of Chronic Issues (not fasting, ), Asthma, and Hypothyroidism   Diagnosis and orders addressed:  1. Uncomplicated asthma, unspecified asthma severity, unspecified whether persistent - CMP14+EGFR - CBC with Differential/Platelet  2. Gastroesophageal reflux disease without esophagitis - CMP14+EGFR - CBC with Differential/Platelet  3. Other specified hypothyroidism - CMP14+EGFR - CBC with Differential/Platelet - TSH  4. Chronic bilateral low back pain without sciatica - CMP14+EGFR - CBC with Differential/Platelet  5. Elevated liver function tests - CMP14+EGFR - CBC with Differential/Platelet  6. Hyperlipidemia, unspecified hyperlipidemia type - CMP14+EGFR - CBC with Differential/Platelet  7. History of lower GI bleeding - CMP14+EGFR - CBC with Differential/Platelet  8. Overweight (BMI 25.0-29.9) - CMP14+EGFR - CBC with Differential/Platelet   Labs pending Health Maintenance reviewed Diet and exercise encouraged  Follow up plan: 6 months    [name], FNP

## 2019-09-04 LAB — CMP14+EGFR
ALT: 17 IU/L (ref 0–32)
AST: 21 IU/L (ref 0–40)
Albumin/Globulin Ratio: 2.2 (ref 1.2–2.2)
Albumin: 4.4 g/dL (ref 3.7–4.7)
Alkaline Phosphatase: 73 IU/L (ref 48–121)
BUN/Creatinine Ratio: 14 (ref 12–28)
BUN: 11 mg/dL (ref 8–27)
Bilirubin Total: 0.4 mg/dL (ref 0.0–1.2)
CO2: 24 mmol/L (ref 20–29)
Calcium: 9.4 mg/dL (ref 8.7–10.3)
Chloride: 105 mmol/L (ref 96–106)
Creatinine, Ser: 0.8 mg/dL (ref 0.57–1.00)
GFR calc Af Amer: 84 mL/min/{1.73_m2} (ref >59)
GFR calc non Af Amer: 73 mL/min/{1.73_m2} (ref >59)
Globulin, Total: 2 g/dL (ref 1.5–4.5)
Glucose: 98 mg/dL (ref 65–99)
Potassium: 4.1 mmol/L (ref 3.5–5.2)
Sodium: 143 mmol/L (ref 134–144)
Total Protein: 6.4 g/dL (ref 6.0–8.5)

## 2019-09-04 LAB — CBC WITH DIFFERENTIAL/PLATELET
Basophils Absolute: 0.1 10*3/uL (ref 0.0–0.2)
Basos: 1 %
EOS (ABSOLUTE): 0.1 10*3/uL (ref 0.0–0.4)
Eos: 1 %
Hematocrit: 42.6 % (ref 34.0–46.6)
Hemoglobin: 14.3 g/dL (ref 11.1–15.9)
Immature Grans (Abs): 0 10*3/uL (ref 0.0–0.1)
Immature Granulocytes: 0 %
Lymphocytes Absolute: 1.1 10*3/uL (ref 0.7–3.1)
Lymphs: 21 %
MCH: 31.4 pg (ref 26.6–33.0)
MCHC: 33.6 g/dL (ref 31.5–35.7)
MCV: 93 fL (ref 79–97)
Monocytes Absolute: 0.5 10*3/uL (ref 0.1–0.9)
Monocytes: 10 %
Neutrophils Absolute: 3.5 10*3/uL (ref 1.4–7.0)
Neutrophils: 67 %
Platelets: 198 10*3/uL (ref 150–450)
RBC: 4.56 x10E6/uL (ref 3.77–5.28)
RDW: 12.3 % (ref 11.7–15.4)
WBC: 5.3 10*3/uL (ref 3.4–10.8)

## 2019-09-04 LAB — TSH: TSH: 3.97 u[IU]/mL (ref 0.450–4.500)

## 2019-09-17 ENCOUNTER — Ambulatory Visit: Payer: Medicare Other

## 2019-09-18 ENCOUNTER — Other Ambulatory Visit: Payer: Self-pay

## 2019-09-18 ENCOUNTER — Ambulatory Visit (INDEPENDENT_AMBULATORY_CARE_PROVIDER_SITE_OTHER): Payer: Self-pay | Admitting: *Deleted

## 2019-09-18 VITALS — Ht 66.0 in | Wt 175.0 lb

## 2019-09-18 DIAGNOSIS — Z86010 Personal history of colonic polyps: Secondary | ICD-10-CM

## 2019-09-18 MED ORDER — NA SULFATE-K SULFATE-MG SULF 17.5-3.13-1.6 GM/177ML PO SOLN
1.0000 | Freq: Once | ORAL | 0 refills | Status: AC
Start: 2019-09-18 — End: 2019-09-18

## 2019-09-18 NOTE — Progress Notes (Signed)
Gastroenterology Pre-Procedure Review  Request Date: 09/18/2019 Requesting Physician: 5 year recall, Last TCS done 01/26/2013 by Dr. Earlean Shawl, no polyps, hx adenomatous polyps  PATIENT REVIEW QUESTIONS: The patient responded to the following health history questions as indicated:    1. Diabetes Melitis: no 2. Joint replacements in the past 12 months: no 3. Major health problems in the past 3 months: no 4. Has an artificial valve or MVP: no 5. Has a defibrillator: no 6. Has been advised in past to take antibiotics in advance of a procedure like teeth cleaning: no 7. Family history of colon cancer: no  8. Alcohol Use: no 9. Illicit drug Use: no 10. History of sleep apnea: no  11. History of coronary artery or other vascular stents placed within the last 12 months: no 12. History of any prior anesthesia complications: yes, n/v 13. Body mass index is 28.25 kg/m.    MEDICATIONS & ALLERGIES:    Patient reports the following regarding taking any blood thinners:   Plavix? no Aspirin? no Coumadin? no Brilinta? no Xarelto? no Eliquis? no Pradaxa? no Savaysa? no Effient? no  Patient confirms/reports the following medications:  Current Outpatient Medications  Medication Sig Dispense Refill   albuterol (PROVENTIL HFA) 108 (90 Base) MCG/ACT inhaler Inhale 2 puffs into the lungs every 6 (six) hours as needed for wheezing. May use only 3 times a month 18 g 3   calcium carbonate (TUMS - DOSED IN MG ELEMENTAL CALCIUM) 500 MG chewable tablet Chew 1 tablet by mouth as needed for indigestion or heartburn.     Calcium Carbonate-Vitamin D 600-400 MG-UNIT tablet Take by mouth daily.      Cholecalciferol (VITAMIN D) 2000 UNITS CAPS Take 2,000 Units by mouth daily.      fluticasone-salmeterol (ADVAIR HFA) 45-21 MCG/ACT inhaler Inhale 2 puffs into the lungs 2 (two) times daily. 3 Inhaler 3   levothyroxine (SYNTHROID) 75 MCG tablet TAKE 1 TABLET (75 MCG TOTAL)  BY MOUTH DAILY BEFORE BREAKFAST. 90  tablet 3   loratadine (CLARITIN) 10 MG tablet Take 10 mg by mouth daily.     methocarbamol (ROBAXIN) 750 MG tablet Take 375 mg by mouth every 6 (six) hours as needed for muscle spasms.   2   mycophenolate (CELLCEPT) 500 MG tablet Take 1,000 mg by mouth 2 (two) times daily. Takes 1000 mg twice daily.  Total:  2000 mg daily.     ondansetron (ZOFRAN ODT) 4 MG disintegrating tablet Take 1 tablet (4 mg total) by mouth every 8 (eight) hours as needed for up to 10 doses for nausea or vomiting. (Patient taking differently: Take 4 mg by mouth as needed for nausea or vomiting. ) 10 tablet 0   pantoprazole (PROTONIX) 40 MG tablet Take 1 tablet (40 mg total) by mouth daily before breakfast. (Patient taking differently: Take 40 mg by mouth 3 (three) times a week. ) 90 tablet 3   No current facility-administered medications for this visit.    Patient confirms/reports the following allergies:  Allergies  Allergen Reactions   Azathioprine Other (See Comments)    Cholestatic liver injury    Other Other (See Comments)    Elevated LFT's   Statins Other (See Comments)    Elevated LFT's   Sulfa Antibiotics Hives   Naproxen Hives and Itching    Note - also has Ulcer - cant take    Lipitor [Atorvastatin]     mylagias   Premarin [Conjugated Estrogens] Itching    tightness in chest   Ampicillin  Rash   Avelox [Moxifloxacin Hcl In Nacl] Rash   Azithromycin Rash    Concern for DRESS after useage   Bactrim [Sulfamethoxazole-Trimethoprim] Rash   Crestor [Rosuvastatin Calcium]     Elevated liver results?   Doxycycline Rash    Concern for DRESS after usage.   Levaquin [Levofloxacin In D5w] Rash   Livalo [Pitavastatin] Other (See Comments)    myalgias   Macrodantin [Nitrofurantoin Macrocrystal] Hives and Rash   Trental [Pentoxifylline Er] Rash         No orders of the defined types were placed in this encounter.   AUTHORIZATION INFORMATION Primary Insurance: Medicare,  ID #:  [id number] Pre-Cert / Auth required: No, not required  Secondary Insurance: [employer],  [place] #: [id number] Pre-Cert / Josem Kaufmann required: No, not required  SCHEDULE INFORMATION: Procedure has been scheduled as follows:  Date: 11/18/2019, Time: 9:30 Location: [facility] with Dr. Gala Romney  This Gastroenterology Pre-Precedure Review Form is being routed to the following provider(s): Neil Crouch, PA

## 2019-09-18 NOTE — Patient Instructions (Signed)
Cheryl Perry  1945/05/17 MRN: [id number]     Procedure Date: 11/18/2019 Time to register: 8:30 am Place to register: [facility] Procedure Time: 9:30 am Scheduled provider: Dr. Gala Romney    PREPARATION FOR COLONOSCOPY WITH SUPREP BOWEL PREP KIT  Note: Suprep Bowel Prep Kit is a split-dose (2day) regimen. Consumption of BOTH 6-ounce bottles is required for a complete prep.  Please notify us immediately if you are diabetic, take iron supplements, or if you are on Coumadin or any other blood thinners.  Please hold the following medications: n/a                                                                                                                                                  2 DAYS BEFORE PROCEDURE:  DATE: 11/16/2019   DAY: Monday Begin clear liquid diet AFTER your lunch meal. NO SOLID FOODS after this point.  1 DAY BEFORE PROCEDURE:  DATE: 11/17/2019   DAY: Tuesday Continue clear liquids the entire day - NO SOLID FOOD.   Diabetic medications adjustments for today: See letter  At 6:00pm: Complete steps 1 through 4 below, using ONE (1) 6-ounce bottle, before going to bed. Step 1:  Pour ONE (1) 6-ounce bottle of SUPREP liquid into the mixing container.  Step 2:  Add cool drinking water to the 16 ounce line on the container and mix.  Note: Dilute the solution concentrate as directed prior to use. Step 3:  DRINK ALL the liquid in the container. Step 4:  You MUST drink an additional two (2) or more 16 ounce containers of water over the next one (1) hour.   Continue clear liquids.  DAY OF PROCEDURE:   DATE: 11/18/2019   DAY: Wednesday If you take medications for your heart, blood pressure, or breathing, you may take these medications.  Diabetic medications adjustments for today: n/a  5 hours before your procedure at 4:30 am: Step 1:  Pour ONE (1) 6-ounce bottle of SUPREP liquid into the mixing container.  Step 2:  Add cool drinking water to the 16 ounce line on  the container and mix.  Note: Dilute the solution concentrate as directed prior to use. Step 3:  DRINK ALL the liquid in the container. Step 4:  You MUST drink an additional two (2) or more 16 ounce containers of water over the next one (1) hour. You MUST complete the final glass of water at least 3 hours before your colonoscopy. Nothing by mouth past 6:30 am.  You may take your morning medications with sip of water unless we have instructed otherwise.    Please see below for Dietary Information.  CLEAR LIQUIDS INCLUDE:  Water Jello (NOT red in color)   Ice Popsicles (NOT red in color)   Tea (sugar ok, no milk/cream) Powdered fruit flavored drinks  Coffee (sugar ok,  no milk/cream) Gatorade/ Lemonade/ Kool-Aid  (NOT red in color)   Juice: apple, white grape, white cranberry Soft drinks  Clear bullion, consomme, broth (fat free beef/chicken/vegetable)  Carbonated beverages (any kind)  Strained chicken noodle soup Hard Candy   Remember: Clear liquids are liquids that will allow you to see your fingers on the other side of a clear glass. Be sure liquids are NOT red in color, and not cloudy, but CLEAR.  DO NOT EAT OR DRINK ANY OF THE FOLLOWING:  Dairy products of any kind   Cranberry juice Tomato juice / V8 juice   Grapefruit juice Orange juice     Red grape juice  Do not eat any solid foods, including such foods as: cereal, oatmeal, yogurt, fruits, vegetables, creamed soups, eggs, bread, crackers, pureed foods in a blender, etc.   HELPFUL HINTS FOR DRINKING PREP SOLUTION:   Make sure prep is extremely cold. Mix and refrigerate the the morning of the prep. You may also put in the freezer.   You may try mixing some Crystal Light or Country Time Lemonade if you prefer. Mix in small amounts; add more if necessary.  Try drinking through a straw  Rinse mouth with water or a mouthwash between glasses, to remove after-taste.  Try sipping on a cold beverage /ice/ popsicles between glasses  of prep.  Place a piece of sugar-free hard candy in mouth between glasses.  If you become nauseated, try consuming smaller amounts, or stretch out the time between glasses. Stop for 30-60 minutes, then slowly start back drinking.     OTHER INSTRUCTIONS  You will need a responsible adult at least 74 years of age to accompany you and drive you home. This person must remain in the waiting room during your procedure. The hospital will cancel your procedure if you do not have a responsible adult with you.   1. Wear loose fitting clothing that is easily removed. 2. Leave jewelry and other valuables at home.  3. Remove all body piercing jewelry and leave at home. 4. Total time from sign-in until discharge is approximately 2-3 hours. 5. You should go home directly after your procedure and rest. You can resume normal activities the day after your procedure. 6. The day of your procedure you should not:  Drive  Make legal decisions  Operate machinery  Drink alcohol  Return to work   You may call the office (Dept: [phone]) before 5:00pm, or page the doctor on call [phone]) after 5:00pm, for further instructions, if necessary.   Insurance Information YOU WILL NEED TO CHECK WITH YOUR INSURANCE COMPANY FOR THE BENEFITS OF COVERAGE YOU HAVE FOR THIS PROCEDURE.  UNFORTUNATELY, NOT ALL INSURANCE COMPANIES HAVE BENEFITS TO COVER ALL OR PART OF THESE TYPES OF PROCEDURES.  IT IS YOUR RESPONSIBILITY TO CHECK YOUR BENEFITS, HOWEVER, WE WILL BE GLAD TO ASSIST YOU WITH ANY CODES YOUR INSURANCE COMPANY MAY NEED.    PLEASE NOTE THAT MOST INSURANCE COMPANIES WILL NOT COVER A SCREENING COLONOSCOPY FOR PEOPLE UNDER THE AGE OF 50  IF YOU HAVE [employer] INSURANCE, YOU MAY HAVE BENEFITS FOR A SCREENING COLONOSCOPY BUT IF POLYPS ARE FOUND THE DIAGNOSIS WILL CHANGE AND THEN YOU MAY HAVE A DEDUCTIBLE THAT WILL NEED TO BE MET. SO PLEASE MAKE SURE YOU CHECK YOUR BENEFITS FOR A SCREENING COLONOSCOPY AS WELL AS  A DIAGNOSTIC COLONOSCOPY.

## 2019-09-24 NOTE — Progress Notes (Signed)
Ok to schedule conscious sedation with Dr. Gala Romney. ASA III

## 2019-09-25 DIAGNOSIS — D84821 Immunodeficiency due to drugs: Secondary | ICD-10-CM | POA: Diagnosis not present

## 2019-09-25 DIAGNOSIS — K754 Autoimmune hepatitis: Secondary | ICD-10-CM | POA: Diagnosis not present

## 2019-09-25 DIAGNOSIS — Z23 Encounter for immunization: Secondary | ICD-10-CM | POA: Diagnosis not present

## 2019-09-25 DIAGNOSIS — Z944 Liver transplant status: Secondary | ICD-10-CM | POA: Diagnosis not present

## 2019-09-25 DIAGNOSIS — Z79899 Other long term (current) drug therapy: Secondary | ICD-10-CM | POA: Diagnosis not present

## 2019-09-25 DIAGNOSIS — Z7951 Long term (current) use of inhaled steroids: Secondary | ICD-10-CM | POA: Diagnosis not present

## 2019-09-25 DIAGNOSIS — K7400 Hepatic fibrosis, unspecified: Secondary | ICD-10-CM | POA: Diagnosis not present

## 2019-09-25 DIAGNOSIS — D849 Immunodeficiency, unspecified: Secondary | ICD-10-CM | POA: Diagnosis not present

## 2019-10-08 DIAGNOSIS — M5030 Other cervical disc degeneration, unspecified cervical region: Secondary | ICD-10-CM | POA: Diagnosis not present

## 2019-10-08 DIAGNOSIS — M5136 Other intervertebral disc degeneration, lumbar region: Secondary | ICD-10-CM | POA: Diagnosis not present

## 2019-10-09 DIAGNOSIS — Z23 Encounter for immunization: Secondary | ICD-10-CM | POA: Diagnosis not present

## 2019-10-27 DIAGNOSIS — J3089 Other allergic rhinitis: Secondary | ICD-10-CM | POA: Diagnosis not present

## 2019-10-27 DIAGNOSIS — J301 Allergic rhinitis due to pollen: Secondary | ICD-10-CM | POA: Diagnosis not present

## 2019-10-27 DIAGNOSIS — J4540 Moderate persistent asthma, uncomplicated: Secondary | ICD-10-CM | POA: Diagnosis not present

## 2019-10-27 DIAGNOSIS — J3081 Allergic rhinitis due to animal (cat) (dog) hair and dander: Secondary | ICD-10-CM | POA: Diagnosis not present

## 2019-10-29 DIAGNOSIS — K7400 Hepatic fibrosis, unspecified: Secondary | ICD-10-CM | POA: Diagnosis not present

## 2019-11-16 ENCOUNTER — Other Ambulatory Visit: Payer: Self-pay

## 2019-11-16 ENCOUNTER — Other Ambulatory Visit (HOSPITAL_COMMUNITY)
Admission: RE | Admit: 2019-11-16 | Discharge: 2019-11-16 | Disposition: A | Discharge status: Home / Self Care | Payer: Medicare Other | Source: Ambulatory Visit | Attending: Internal Medicine | Admitting: Internal Medicine

## 2019-11-16 DIAGNOSIS — Z20822 Contact with and (suspected) exposure to covid-19: Secondary | ICD-10-CM | POA: Diagnosis not present

## 2019-11-16 DIAGNOSIS — Z01812 Encounter for preprocedural laboratory examination: Secondary | ICD-10-CM | POA: Diagnosis not present

## 2019-11-17 LAB — SARS CORONAVIRUS 2 (TAT 6-24 HRS): SARS Coronavirus 2: NEGATIVE

## 2019-11-18 ENCOUNTER — Ambulatory Visit (HOSPITAL_COMMUNITY)
Admission: RE | Admit: 2019-11-18 | Discharge: 2019-11-18 | Disposition: A | Discharge status: Home / Self Care | Payer: Medicare Other | Attending: Internal Medicine | Admitting: Internal Medicine

## 2019-11-18 ENCOUNTER — Encounter (HOSPITAL_COMMUNITY): Payer: Self-pay | Admitting: Internal Medicine

## 2019-11-18 ENCOUNTER — Other Ambulatory Visit: Payer: Self-pay

## 2019-11-18 ENCOUNTER — Encounter (HOSPITAL_COMMUNITY)
Admission: RE | Disposition: A | Discharge status: Home / Self Care | Payer: Self-pay | Source: Home / Self Care | Attending: Internal Medicine

## 2019-11-18 DIAGNOSIS — Z923 Personal history of irradiation: Secondary | ICD-10-CM | POA: Insufficient documentation

## 2019-11-18 DIAGNOSIS — Q438 Other specified congenital malformations of intestine: Secondary | ICD-10-CM | POA: Insufficient documentation

## 2019-11-18 DIAGNOSIS — K5730 Diverticulosis of large intestine without perforation or abscess without bleeding: Secondary | ICD-10-CM | POA: Insufficient documentation

## 2019-11-18 DIAGNOSIS — Z79899 Other long term (current) drug therapy: Secondary | ICD-10-CM | POA: Diagnosis not present

## 2019-11-18 DIAGNOSIS — Z853 Personal history of malignant neoplasm of breast: Secondary | ICD-10-CM | POA: Diagnosis not present

## 2019-11-18 DIAGNOSIS — Z86010 Personal history of colonic polyps: Secondary | ICD-10-CM | POA: Diagnosis not present

## 2019-11-18 DIAGNOSIS — Z87891 Personal history of nicotine dependence: Secondary | ICD-10-CM | POA: Diagnosis not present

## 2019-11-18 DIAGNOSIS — Z79890 Hormone replacement therapy (postmenopausal): Secondary | ICD-10-CM | POA: Diagnosis not present

## 2019-11-18 DIAGNOSIS — Z7951 Long term (current) use of inhaled steroids: Secondary | ICD-10-CM | POA: Diagnosis not present

## 2019-11-18 DIAGNOSIS — K754 Autoimmune hepatitis: Secondary | ICD-10-CM | POA: Diagnosis not present

## 2019-11-18 DIAGNOSIS — Z1211 Encounter for screening for malignant neoplasm of colon: Secondary | ICD-10-CM | POA: Diagnosis not present

## 2019-11-18 HISTORY — PX: COLONOSCOPY: SHX5424

## 2019-11-18 SURGERY — COLONOSCOPY
Anesthesia: Moderate Sedation

## 2019-11-18 MED ORDER — MIDAZOLAM HCL 5 MG/5ML IJ SOLN
INTRAMUSCULAR | Status: AC
  Filled 2019-11-18: qty 5

## 2019-11-18 MED ORDER — STERILE WATER FOR IRRIGATION IR SOLN
Status: DC | PRN
  Administered 2019-11-18: 5 mL

## 2019-11-18 MED ORDER — MEPERIDINE HCL 50 MG/ML IJ SOLN
INTRAMUSCULAR | Status: AC
  Filled 2019-11-18: qty 1

## 2019-11-18 MED ORDER — MIDAZOLAM HCL 5 MG/5ML IJ SOLN
INTRAMUSCULAR | Status: DC | PRN
  Administered 2019-11-18 (×2): 1 mg via INTRAVENOUS
  Administered 2019-11-18: 2 mg via INTRAVENOUS
  Administered 2019-11-18 (×2): 1 mg via INTRAVENOUS

## 2019-11-18 MED ORDER — MEPERIDINE HCL 100 MG/ML IJ SOLN
INTRAMUSCULAR | Status: DC | PRN
  Administered 2019-11-18: 15 mg
  Administered 2019-11-18: 25 mg

## 2019-11-18 MED ORDER — SODIUM CHLORIDE 0.9 % IV SOLN: INTRAVENOUS | Status: DC

## 2019-11-18 MED ORDER — ONDANSETRON HCL 4 MG/2ML IJ SOLN
INTRAMUSCULAR | Status: DC | PRN
  Administered 2019-11-18: 4 mg via INTRAVENOUS

## 2019-11-18 MED ORDER — ONDANSETRON HCL 4 MG/2ML IJ SOLN
INTRAMUSCULAR | Status: AC
  Filled 2019-11-18: qty 2

## 2019-11-18 NOTE — Op Note (Signed)
[facility] Patient Name: Cheryl Perry Procedure Date: 11/18/2019 8:55 AM MRN: [id number] Date of Birth: Apr 27, 1945 Attending MD: Norvel Richards , MD CSN: [id number] Age: 74 Admit Type: Outpatient Procedure:                Colonoscopy Indications:              High risk colon cancer surveillance: Personal                            history of colonic polyps Providers:                Norvel Richards, MD, Janeece Riggers, RN, Raphael Gibney, Technician Referring MD:              Medicines:                Midazolam 4 mg IV, Meperidine 40 mg IV Complications:            No immediate complications. Estimated Blood Loss:     Estimated blood loss: none. Procedure:                Pre-Anesthesia Assessment:                           - Prior to the procedure, a History and Physical                            was performed, and patient medications and                            allergies were reviewed. The patient's tolerance of                            previous anesthesia was also reviewed. The risks                            and benefits of the procedure and the sedation                            options and risks were discussed with the patient.                            All questions were answered, and informed consent                            was obtained. Prior Anticoagulants: The patient has                            taken no previous anticoagulant or antiplatelet                            agents. ASA Grade Assessment: II - A patient with  mild systemic disease. After reviewing the risks                            and benefits, the patient was deemed in                            satisfactory condition to undergo the procedure.                           After obtaining informed consent, the colonoscope                            was passed under direct vision. Throughout the                            procedure,  the patient's blood pressure, pulse, and                            oxygen saturations were monitored continuously. The                            CF-HQ190L ([id number]) scope was introduced through                            the anus and advanced to the the cecum, identified                            by appendiceal orifice and ileocecal valve. The                            colonoscopy was performed without difficulty. The                            patient tolerated the procedure well. The quality                            of the bowel preparation was adequate. Scope In: 9:14:25 AM Scope Out: 9:33:36 AM Scope Withdrawal Time: 0 hours 8 minutes 29 seconds  Total Procedure Duration: 0 hours 19 minutes 11 seconds  Findings:      The perianal and digital rectal examinations were normal. Rectal vault       too small to retroflex. Seen well on?"face. Redundant and elongated       colon. External abdominal pressure required to reach the cecum      The exam was otherwise without abnormality on direct and retroflexion       views.      Scattered medium-mouthed diverticula were found in the entire colon. Impression:               -Redundant and elongated colon.                           - Diverticulosis in the entire examined colon.                           -  No specimens collected. Moderate Sedation:      Moderate (conscious) sedation was administered by the endoscopy nurse       and supervised by the endoscopist. The following parameters were       monitored: oxygen saturation, heart rate, blood pressure, respiratory       rate, EKG, adequacy of pulmonary ventilation, and response to care.       Total physician intraservice time was 26 minutes. Recommendation:           - Patient has a contact number available for                            emergencies. The signs and symptoms of potential                            delayed complications were discussed with the                             patient. Return to normal activities tomorrow.                            Written discharge instructions were provided to the                            patient.                           - Resume previous diet.                           - Continue present medications.                           - No repeat colonoscopy due to age.                           - Return to GI clinic PRN. Procedure Code(s):        --- Professional ---                           [phone], Colonoscopy, flexible; diagnostic, including                            collection of specimen(s) by brushing or washing,                            when performed (separate procedure)                           99153, Moderate sedation; each additional 15                            minutes intraservice time                           G0500, Moderate sedation services provided by the  same physician or other qualified health care                            professional performing a gastrointestinal                            endoscopic service that sedation supports,                            requiring the presence of an independent trained                            observer to assist in the monitoring of the                            patient's level of consciousness and physiological                            status; initial 15 minutes of intra-service time;                            patient age 40 years or older (additional time may                            be reported with [phone], as appropriate) Diagnosis Code(s):        --- Professional ---                           Z86.010, Personal history of colonic polyps                           K57.30, Diverticulosis of large intestine without                            perforation or abscess without bleeding CPT copyright 2019 American Medical Association. All rights reserved. The codes documented in this report are preliminary and upon coder review may  be  revised to meet current compliance requirements. Cristopher Estimable. Chalyn Amescua, MD Norvel Richards, MD 11/18/2019 9:42:19 AM This report has been signed electronically. Number of Addenda: 0

## 2019-11-18 NOTE — Discharge Instructions (Signed)
Colonoscopy Discharge Instructions  Read the instructions outlined below and refer to this sheet in the next few weeks. These discharge instructions provide you with general information on caring for yourself after you leave the hospital. Your doctor may also give you specific instructions. While your treatment has been planned according to the most current medical practices available, unavoidable complications occasionally occur. If you have any problems or questions after discharge, call Dr. Gala Romney at [phone]. ACTIVITY  You may resume your regular activity, but move at a slower pace for the next 24 hours.   Take frequent rest periods for the next 24 hours.   Walking will help get rid of the air and reduce the bloated feeling in your belly (abdomen).   No driving for 24 hours (because of the medicine (anesthesia) used during the test).    Do not sign any important legal documents or operate any machinery for 24 hours (because of the anesthesia used during the test).  NUTRITION  Drink plenty of fluids.   You may resume your normal diet as instructed by your doctor.   Begin with a light meal and progress to your normal diet. Heavy or fried foods are harder to digest and may make you feel sick to your stomach (nauseated).   Avoid alcoholic beverages for 24 hours or as instructed.  MEDICATIONS  You may resume your normal medications unless your doctor tells you otherwise.  WHAT YOU CAN EXPECT TODAY  Some feelings of bloating in the abdomen.   Passage of more gas than usual.   Spotting of blood in your stool or on the toilet paper.  IF YOU HAD POLYPS REMOVED DURING THE COLONOSCOPY:  No aspirin products for 7 days or as instructed.   No alcohol for 7 days or as instructed.   Eat a soft diet for the next 24 hours.  FINDING OUT THE RESULTS OF YOUR TEST Not all test results are available during your visit. If your test results are not back during the visit, make an appointment  with your caregiver to find out the results. Do not assume everything is normal if you have not heard from your caregiver or the medical facility. It is important for you to follow up on all of your test results.  SEEK IMMEDIATE MEDICAL ATTENTION IF:  You have more than a spotting of blood in your stool.   Your belly is swollen (abdominal distention).   You are nauseated or vomiting.   You have a temperature over 101.   You have abdominal pain or discomfort that is severe or gets worse throughout the day.   Diverticulosis information provided  No polyps found today  A future colonoscopy is not recommended unless new symptoms develop  At patient request I called Lutricia Horsfall at [phone] -reviewed results and recommendations    Diverticulosis  Diverticulosis is a condition that develops when small pouches (diverticula) form in the wall of the large intestine (colon). The colon is where water is absorbed and stool (feces) is formed. The pouches form when the inside layer of the colon pushes through weak spots in the outer layers of the colon. You may have a few pouches or many of them. The pouches usually do not cause problems unless they become inflamed or infected. When this happens, the condition is called diverticulitis. What are the causes? The cause of this condition is not known. What increases the risk? The following factors may make you more likely to develop this condition:  Being  older than age 36. Your risk for this condition increases with age. Diverticulosis is rare among people younger than age 15. By age 41, many people have it.  Eating a low-fiber diet.  Having frequent constipation.  Being overweight.  Not getting enough exercise.  Smoking.  Taking over-the-counter pain medicines, like aspirin and ibuprofen.  Having a family history of diverticulosis. What are the signs or symptoms? In most people, there are no symptoms of this condition. If you do  have symptoms, they may include:  Bloating.  Cramps in the abdomen.  Constipation or diarrhea.  Pain in the lower left side of the abdomen. How is this diagnosed? Because diverticulosis usually has no symptoms, it is most often diagnosed during an exam for other colon problems. The condition may be diagnosed by:  Using a flexible scope to examine the colon (colonoscopy).  Taking an X-ray of the colon after dye has been put into the colon (barium enema).  Having a CT scan. How is this treated? You may not need treatment for this condition. Your health care provider may recommend treatment to prevent problems. You may need treatment if you have symptoms or if you previously had diverticulitis. Treatment may include:  Eating a high-fiber diet.  Taking a fiber supplement.  Taking a live bacteria supplement (probiotic).  Taking medicine to relax your colon. Follow these instructions at home: Medicines  Take over-the-counter and prescription medicines only as told by your health care provider.  If told by your health care provider, take a fiber supplement or probiotic. Constipation prevention Your condition may cause constipation. To prevent or treat constipation, you may need to:  Drink enough fluid to keep your urine pale yellow.  Take over-the-counter or prescription medicines.  Eat foods that are high in fiber, such as beans, whole grains, and fresh fruits and vegetables.  Limit foods that are high in fat and processed sugars, such as fried or sweet foods.  General instructions  Try not to strain when you have a bowel movement.  Keep all follow-up visits as told by your health care provider. This is important. Contact a health care provider if you:  Have pain in your abdomen.  Have bloating.  Have cramps.  Have not had a bowel movement in 3 days. Get help right away if:  Your pain gets worse.  Your bloating becomes very bad.  You have a fever or chills,  and your symptoms suddenly get worse.  You vomit.  You have bowel movements that are bloody or black.  You have bleeding from your rectum. Summary  Diverticulosis is a condition that develops when small pouches (diverticula) form in the wall of the large intestine (colon).  You may have a few pouches or many of them.  This condition is most often diagnosed during an exam for other colon problems.  Treatment may include increasing the fiber in your diet, taking supplements, or taking medicines. This information is not intended to replace advice given to you by your health care provider. Make sure you discuss any questions you have with your health care provider. Document Revised: 07/24/2018 Document Reviewed: 07/24/2018 Elsevier Patient Education  [place].

## 2019-11-18 NOTE — H&P (Signed)
_0 @   Primary Care Physician:  Sharion Balloon, FNP Primary Gastroenterologist:  Dr. Gala Romney  Pre-Procedure History & Physical: HPI:  Cheryl Perry is a 74 y.o. female here for surveillance colonoscopy.  History of colonic adenomas removed elsewhere.  Last colonoscopy elsewhere.  2015-diverticulosis only.  History of autoimmune hepatitis followed closely by Dr. Dawna Part at [facility]  Past Medical History:  Diagnosis Date  . Allergy   . Arthritis    back painherniated disc lumbar  . Asthma   . Autoimmune hepatitis ([place])   . Back pain   . Cancer ([place]) 07/2017   right breast cancer, lumpectomy and 20 sessions of XRT  . Complication of anesthesia   . GERD (gastroesophageal reflux disease)   . Hiatal hernia   . Hyperlipidemia   . Hypothyroidism   . Osteopenia   . PONV (postoperative nausea and vomiting)    asks for scop patch  . Thyroid disease   . Vasculopathy    lymphatic    Past Surgical History:  Procedure Laterality Date  . BREAST LUMPECTOMY WITH RADIOACTIVE SEED AND SENTINEL LYMPH NODE BIOPSY Right 08/22/2017   Procedure: BREAST LUMPECTOMY WITH RADIOACTIVE SEED AND SENTINEL LYMPH NODE BIOPSY;  Surgeon: Erroll Luna, MD;  Location: [place];  Service: General;  Laterality: Right;  . COLONOSCOPY  2015   Dr. Earlean Shawl: Hemorrhoids, diverticulosis.  Next colonoscopy in 5 years due to previous history of adenomatous colon polyps  . CYSTOSCOPY    . cystscopy    . ESOPHAGOGASTRODUODENOSCOPY N/A 01/28/2015   Dr.Mikalyn Hermida- 3 small prepyloric/gastric ulcers- likely culprits causing bleeding. hiatal hernia, small gastic polyps not manipulated.  . ESOPHAGOGASTRODUODENOSCOPY N/A 05/26/2015   Dr. Gala Romney: Small hiatal hernia, previous gastric ulcers completely healed.  Marland Kitchen KNEE SURGERY    . LIVER BIOPSY  10/09/2018  . TONSILLECTOMY    . TUBAL LIGATION      Prior to Admission medications   Medication Sig Start Date End Date Taking? Authorizing Provider  albuterol  (PROVENTIL HFA) 108 (90 Base) MCG/ACT inhaler Inhale 2 puffs into the lungs every 6 (six) hours as needed for wheezing. May use only 3 times a month 03/05/19  Yes Hawks, Crestwood A, FNP  calcium carbonate (TUMS - DOSED IN MG ELEMENTAL CALCIUM) 500 MG chewable tablet Chew 1 tablet by mouth 3 (three) times daily as needed for indigestion or heartburn.    Yes [provider]  Calcium Carbonate-Vitamin D 600-400 MG-UNIT tablet Take 2 tablets by mouth daily at 12 noon.    Yes [provider]  Cholecalciferol (VITAMIN D) 2000 UNITS CAPS Take 2,000 Units by mouth daily.    Yes [provider]  fluticasone-salmeterol (ADVAIR HFA) 45-21 MCG/ACT inhaler Inhale 2 puffs into the lungs 2 (two) times daily. 12/14/14  Yes Hawks, Alyse Low A, FNP  levothyroxine (SYNTHROID) 75 MCG tablet TAKE 1 TABLET (75 MCG TOTAL)  BY MOUTH DAILY BEFORE BREAKFAST. 03/09/19  Yes Hawks, Christy A, FNP  loratadine (CLARITIN) 10 MG tablet Take 10 mg by mouth daily.   Yes [provider]  methocarbamol (ROBAXIN) 750 MG tablet Take 375 mg by mouth every 6 (six) hours as needed for muscle spasms.  09/27/17  Yes [provider]  mycophenolate (CELLCEPT) 500 MG tablet Take 1,000 mg by mouth 2 (two) times daily.    Yes [provider]  ondansetron (ZOFRAN ODT) 4 MG disintegrating tablet Take 1 tablet (4 mg total) by mouth every 8 (eight) hours as needed for up to 10 doses  for nausea or vomiting. 07/10/19  Yes Breck Coons, MD  SUPREP BOWEL PREP KIT 17.5-3.13-1.6 GM/177ML SOLN Take 354 mLs by mouth as directed. 09/18/19  Yes [provider]  pantoprazole (PROTONIX) 40 MG tablet Take 1 tablet (40 mg total) by mouth daily before breakfast. Patient taking differently: Take 40 mg by mouth daily as needed (severe indigestion/heartburn.).  06/17/18   Annitta Needs, NP    Allergies as of 09/25/2019 - Review Complete 09/18/2019  Allergen Reaction Noted  . Azathioprine Other (See Comments) 12/03/2018   . Other Other (See Comments) 01/04/2018  . Statins Other (See Comments) 01/04/2018  . Sulfa antibiotics Hives 01/04/2018  . Naproxen Hives and Itching 09/02/2013  . Lipitor [atorvastatin]  06/05/2012  . Premarin [conjugated estrogens] Itching 06/05/2012  . Ampicillin Rash 06/05/2012  . Avelox [moxifloxacin hcl in nacl] Rash 06/05/2012  . Azithromycin Rash 01/08/2018  . Bactrim [sulfamethoxazole-trimethoprim] Rash 06/05/2012  . Crestor [rosuvastatin calcium]  01/23/2018  . Doxycycline Rash 01/08/2018  . Levaquin [levofloxacin in d5w] Rash 06/05/2012  . Livalo [pitavastatin] Other (See Comments) 01/06/2016  . Macrodantin [nitrofurantoin macrocrystal] Hives and Rash 01/16/1985  . Trental [pentoxifylline er] Rash 06/05/2012    Family History  Problem Relation Age of Onset  . Stroke Mother   . Hypertension Mother   . Alzheimer's disease Mother   . Heart disease Sister   . Hypertension Sister   . Heart attack Sister        Psychologist, forensic   . Arthritis Daughter        very bad MVA   . Hypertension Son   . Seizures Son   . Colon cancer Neg Hx   . Liver cancer Neg Hx   . Pancreatic disease Neg Hx     Social History   Socioeconomic History  . Marital status: Married    Spouse name: Not on file  . Number of children: 2  . Years of education: 16  . Highest education level: Bachelor's degree (e.g., BA, AB, BS)  Occupational History  . Occupation: retired     Comment: HR work  / Astronomer   Tobacco Use  . Smoking status: Former Smoker    Quit date: 06/06/1974    Years since quitting: 45.4  . Smokeless tobacco: Never Used  . Tobacco comment: Quit x 40 years ago  Vaping Use  . Vaping Use: Never used  Substance and Sexual Activity  . Alcohol use: No    Alcohol/week: 0.0 standard drinks  . Drug use: No  . Sexual activity: Not on file  Other Topics Concern  . Not on file  Social History Narrative  . Not on file   Social Determinants of Health   Financial  Resource Strain:   . Difficulty of Paying Living Expenses: Not on file  Food Insecurity:   . Worried About Charity fundraiser in the Last Year: Not on file  . Ran Out of Food in the Last Year: Not on file  Transportation Needs:   . Lack of Transportation (Medical): Not on file  . Lack of Transportation (Non-Medical): Not on file  Physical Activity:   . Days of Exercise per Week: Not on file  . Minutes of Exercise per Session: Not on file  Stress:   . Feeling of Stress : Not on file  Social Connections:   . Frequency of Communication with Friends and Family: Not on file  . Frequency of Social Gatherings with Friends and Family: Not on  file  . Attends Religious Services: Not on file  . Active Member of Clubs or Organizations: Not on file  . Attends Archivist Meetings: Not on file  . Marital Status: Not on file  Intimate Partner Violence:   . Fear of Current or Ex-Partner: Not on file  . Emotionally Abused: Not on file  . Physically Abused: Not on file  . Sexually Abused: Not on file    Review of Systems: See HPI, otherwise negative ROS  Physical Exam: BP 126/83   Pulse 68   Temp 97.7 F (36.5 C) (Oral)   Resp 12   Ht _0  (1.676 m)   Wt 77.1 kg   SpO2 99%   BMI 27.44 kg/m  General:   Alert,  Well-developed, well-nourished, pleasant and cooperative in NAD Neck:  Supple; no masses or thyromegaly. No significant cervical adenopathy. Lungs:  Clear throughout to auscultation.   No wheezes, crackles, or rhonchi. No acute distress. Heart:  Regular rate and rhythm; no murmurs, clicks, rubs,  or gallops. Abdomen: Non-distended, normal bowel sounds.  Soft and nontender without appreciable mass or hepatosplenomegaly.  Pulses:  Normal pulses noted. Extremities:  Without clubbing or edema.  Impression/Plan: 74 year old lady with a somewhat distant history of colonic adenomas.  Here for surveillance colonoscopy per plan. The risks, benefits, limitations, alternatives  and imponderables have been reviewed with the patient. Questions have been answered. All parties are agreeable.      Notice: This dictation was prepared with Dragon dictation along with smaller phrase technology. Any transcriptional errors that result from this process are unintentional and may not be corrected upon review.

## 2019-11-23 DIAGNOSIS — J301 Allergic rhinitis due to pollen: Secondary | ICD-10-CM | POA: Diagnosis not present

## 2019-11-23 DIAGNOSIS — J3089 Other allergic rhinitis: Secondary | ICD-10-CM | POA: Diagnosis not present

## 2019-11-23 DIAGNOSIS — J3081 Allergic rhinitis due to animal (cat) (dog) hair and dander: Secondary | ICD-10-CM | POA: Diagnosis not present

## 2019-11-24 ENCOUNTER — Encounter (HOSPITAL_COMMUNITY): Payer: Self-pay | Admitting: Internal Medicine

## 2019-11-26 DIAGNOSIS — J3081 Allergic rhinitis due to animal (cat) (dog) hair and dander: Secondary | ICD-10-CM | POA: Diagnosis not present

## 2019-11-26 DIAGNOSIS — K769 Liver disease, unspecified: Secondary | ICD-10-CM | POA: Diagnosis not present

## 2019-11-26 DIAGNOSIS — J301 Allergic rhinitis due to pollen: Secondary | ICD-10-CM | POA: Diagnosis not present

## 2019-11-26 DIAGNOSIS — J3089 Other allergic rhinitis: Secondary | ICD-10-CM | POA: Diagnosis not present

## 2019-11-26 DIAGNOSIS — K7689 Other specified diseases of liver: Secondary | ICD-10-CM | POA: Diagnosis not present

## 2019-12-01 DIAGNOSIS — J3081 Allergic rhinitis due to animal (cat) (dog) hair and dander: Secondary | ICD-10-CM | POA: Diagnosis not present

## 2019-12-01 DIAGNOSIS — J301 Allergic rhinitis due to pollen: Secondary | ICD-10-CM | POA: Diagnosis not present

## 2019-12-01 DIAGNOSIS — J3089 Other allergic rhinitis: Secondary | ICD-10-CM | POA: Diagnosis not present

## 2019-12-09 DIAGNOSIS — J3089 Other allergic rhinitis: Secondary | ICD-10-CM | POA: Diagnosis not present

## 2019-12-09 DIAGNOSIS — J3081 Allergic rhinitis due to animal (cat) (dog) hair and dander: Secondary | ICD-10-CM | POA: Diagnosis not present

## 2019-12-09 DIAGNOSIS — J301 Allergic rhinitis due to pollen: Secondary | ICD-10-CM | POA: Diagnosis not present

## 2019-12-11 DIAGNOSIS — J3081 Allergic rhinitis due to animal (cat) (dog) hair and dander: Secondary | ICD-10-CM | POA: Diagnosis not present

## 2019-12-11 DIAGNOSIS — J301 Allergic rhinitis due to pollen: Secondary | ICD-10-CM | POA: Diagnosis not present

## 2019-12-11 DIAGNOSIS — J3089 Other allergic rhinitis: Secondary | ICD-10-CM | POA: Diagnosis not present

## 2019-12-14 DIAGNOSIS — J3089 Other allergic rhinitis: Secondary | ICD-10-CM | POA: Diagnosis not present

## 2019-12-14 DIAGNOSIS — J3081 Allergic rhinitis due to animal (cat) (dog) hair and dander: Secondary | ICD-10-CM | POA: Diagnosis not present

## 2019-12-14 DIAGNOSIS — J301 Allergic rhinitis due to pollen: Secondary | ICD-10-CM | POA: Diagnosis not present

## 2019-12-17 DIAGNOSIS — J3089 Other allergic rhinitis: Secondary | ICD-10-CM | POA: Diagnosis not present

## 2019-12-17 DIAGNOSIS — J301 Allergic rhinitis due to pollen: Secondary | ICD-10-CM | POA: Diagnosis not present

## 2019-12-17 DIAGNOSIS — J3081 Allergic rhinitis due to animal (cat) (dog) hair and dander: Secondary | ICD-10-CM | POA: Diagnosis not present

## 2019-12-21 DIAGNOSIS — J3081 Allergic rhinitis due to animal (cat) (dog) hair and dander: Secondary | ICD-10-CM | POA: Diagnosis not present

## 2019-12-21 DIAGNOSIS — J301 Allergic rhinitis due to pollen: Secondary | ICD-10-CM | POA: Diagnosis not present

## 2019-12-21 DIAGNOSIS — J3089 Other allergic rhinitis: Secondary | ICD-10-CM | POA: Diagnosis not present

## 2019-12-25 ENCOUNTER — Other Ambulatory Visit: Payer: Self-pay

## 2019-12-25 ENCOUNTER — Other Ambulatory Visit: Payer: Medicare Other

## 2019-12-25 DIAGNOSIS — J3089 Other allergic rhinitis: Secondary | ICD-10-CM | POA: Diagnosis not present

## 2019-12-25 DIAGNOSIS — J3081 Allergic rhinitis due to animal (cat) (dog) hair and dander: Secondary | ICD-10-CM | POA: Diagnosis not present

## 2019-12-25 DIAGNOSIS — J301 Allergic rhinitis due to pollen: Secondary | ICD-10-CM | POA: Diagnosis not present

## 2019-12-25 DIAGNOSIS — K754 Autoimmune hepatitis: Secondary | ICD-10-CM | POA: Diagnosis not present

## 2019-12-28 DIAGNOSIS — J301 Allergic rhinitis due to pollen: Secondary | ICD-10-CM | POA: Diagnosis not present

## 2019-12-28 DIAGNOSIS — J3081 Allergic rhinitis due to animal (cat) (dog) hair and dander: Secondary | ICD-10-CM | POA: Diagnosis not present

## 2019-12-28 DIAGNOSIS — J3089 Other allergic rhinitis: Secondary | ICD-10-CM | POA: Diagnosis not present

## 2020-01-06 DIAGNOSIS — J3089 Other allergic rhinitis: Secondary | ICD-10-CM | POA: Diagnosis not present

## 2020-01-06 DIAGNOSIS — J3081 Allergic rhinitis due to animal (cat) (dog) hair and dander: Secondary | ICD-10-CM | POA: Diagnosis not present

## 2020-01-06 DIAGNOSIS — J301 Allergic rhinitis due to pollen: Secondary | ICD-10-CM | POA: Diagnosis not present

## 2020-01-12 NOTE — Progress Notes (Deleted)
Office Visit Note  Patient: Cheryl Perry             Date of Birth: [birth year]           MRN: [id number]             PCP: Sharion Balloon, FNP Referring: Sharion Balloon, FNP Visit Date: 01/26/2020 Occupation: @GUAROCC @  Subjective:  No chief complaint on file.   History of Present Illness: Cheryl Perry is a 75 y.o. female ***   Activities of Daily Living:  Patient reports morning stiffness for *** {minute/hour:19697}.   Patient {ACTIONS;DENIES/REPORTS:21021675::"Denies"} nocturnal pain.  Difficulty dressing/grooming: {ACTIONS;DENIES/REPORTS:21021675::"Denies"} Difficulty climbing stairs: {ACTIONS;DENIES/REPORTS:21021675::"Denies"} Difficulty getting out of chair: {ACTIONS;DENIES/REPORTS:21021675::"Denies"} Difficulty using hands for taps, buttons, cutlery, and/or writing: {ACTIONS;DENIES/REPORTS:21021675::"Denies"}  No Rheumatology ROS completed.   PMFS History:  Patient Active Problem List   Diagnosis Date Noted  . Autoimmune hepatitis ([place]) 01/12/2019  . Elevated liver function tests 12/25/2017  . H/O adenomatous polyp of colon 12/12/2017  . Malignant neoplasm of upper-outer quadrant of right breast in female, estrogen receptor positive ([place]) 07/12/2017  . Back pain 12/14/2016  . Overweight (BMI 25.0-29.9) 06/14/2015  . Gastric ulceration   . Hiatal hernia   . History of lower GI bleeding   . UGI bleed 01/27/2015  . Vitamin D deficiency 06/15/2014  . GERD (gastroesophageal reflux disease) 06/15/2014  . Osteopenia 09/02/2013  . Hypothyroidism 06/05/2012  . HLD (hyperlipidemia) 06/05/2012  . Asthma 06/05/2012    Past Medical History:  Diagnosis Date  . Allergy   . Arthritis    back painherniated disc lumbar  . Asthma   . Autoimmune hepatitis ([place])   . Back pain   . Cancer ([place]) 07/2017   right breast cancer, lumpectomy and 20 sessions of XRT  . Complication of anesthesia   . GERD (gastroesophageal reflux disease)   . Hiatal hernia   .  Hyperlipidemia   . Hypothyroidism   . Osteopenia   . PONV (postoperative nausea and vomiting)    asks for scop patch  . Thyroid disease   . Vasculopathy    lymphatic    Family History  Problem Relation Age of Onset  . Stroke Mother   . Hypertension Mother   . Alzheimer's disease Mother   . Heart disease Sister   . Hypertension Sister   . Heart attack Sister        Psychologist, forensic   . Arthritis Daughter        very bad MVA   . Hypertension Son   . Seizures Son   . Colon cancer Neg Hx   . Liver cancer Neg Hx   . Pancreatic disease Neg Hx    Past Surgical History:  Procedure Laterality Date  . BREAST LUMPECTOMY WITH RADIOACTIVE SEED AND SENTINEL LYMPH NODE BIOPSY Right 08/22/2017   Procedure: BREAST LUMPECTOMY WITH RADIOACTIVE SEED AND SENTINEL LYMPH NODE BIOPSY;  Surgeon: Erroll Luna, MD;  Location: [facility];  Service: General;  Laterality: Right;  . COLONOSCOPY  2015   Dr. Earlean Shawl: Hemorrhoids, diverticulosis.  Next colonoscopy in 5 years due to previous history of adenomatous colon polyps  . COLONOSCOPY N/A 11/18/2019   Procedure: COLONOSCOPY;  Surgeon: Daneil Dolin, MD;  [facility];  Service: Endoscopy;  Laterality: N/A;  9:30  . CYSTOSCOPY    . cystscopy    . ESOPHAGOGASTRODUODENOSCOPY N/A 01/28/2015   Dr.Rourk- 3 small prepyloric/gastric ulcers- likely culprits causing bleeding. hiatal hernia, small gastic polyps not  manipulated.  . ESOPHAGOGASTRODUODENOSCOPY N/A 05/26/2015   Dr. Gala Romney: Small hiatal hernia, previous gastric ulcers completely healed.  Marland Kitchen KNEE SURGERY    . LIVER BIOPSY  10/09/2018  . TONSILLECTOMY    . TUBAL LIGATION     Social History   Social History Narrative  . Not on file   Immunization History  Administered Date(s) Administered  . Hepatitis A 04/03/2016  . Influenza, High Dose Seasonal PF 10/11/2015, 10/10/2016, 10/14/2017, 10/17/2018  . Influenza,inj,Quad PF,6+ Mos 10/18/2014  . Influenza,inj,quad, With  Preservative 10/09/2018  . Influenza-Unspecified 12/09/2012, 11/09/2013  . Moderna Sars-Covid-2 Vaccination 02/20/2019, 03/21/2019, 09/25/2019  . Pneumococcal Conjugate-13 12/09/2013  . Pneumococcal Polysaccharide-23 05/04/2011  . Tdap 06/09/2003, 06/09/2013  . Typhoid Live 04/03/2016  . Zoster 04/21/2010     Objective: Vital Signs: There were no vitals taken for this visit.   Physical Exam   Musculoskeletal Exam: ***  CDAI Exam: CDAI Score: -- Patient Global: --; Provider Global: -- Swollen: --; Tender: -- Joint Exam 01/26/2020   No joint exam has been documented for this visit   There is currently no information documented on the homunculus. Go to the Rheumatology activity and complete the homunculus joint exam.  Investigation: No additional findings.  Imaging: No results found.  Recent Labs: Lab Results  Component Value Date   WBC 5.3 09/03/2019   HGB 14.3 09/03/2019   PLT 198 09/03/2019   NA 143 09/03/2019   K 4.1 09/03/2019   CL 105 09/03/2019   CO2 24 09/03/2019   GLUCOSE 98 09/03/2019   BUN 11 09/03/2019   CREATININE 0.80 09/03/2019   BILITOT 0.4 09/03/2019   ALKPHOS 73 09/03/2019   AST 21 09/03/2019   ALT 17 09/03/2019   PROT 6.4 09/03/2019   ALBUMIN 4.4 09/03/2019   CALCIUM 9.4 09/03/2019   GFRAA 84 09/03/2019    Speciality Comments: No specialty comments available.  Procedures:  No procedures performed Allergies: Azathioprine, Statins, Sulfa antibiotics, Naproxen, Lipitor [atorvastatin], Premarin [conjugated estrogens], Ampicillin, Avelox [moxifloxacin hcl in nacl], Azithromycin, Bactrim [sulfamethoxazole-trimethoprim], Crestor [rosuvastatin calcium], Doxycycline, Levaquin [levofloxacin in d5w], Livalo [pitavastatin], Macrodantin [nitrofurantoin macrocrystal], and Trental [pentoxifylline er]   Assessment / Plan:     Visit Diagnoses: No diagnosis found.  Orders: No orders of the defined types were placed in this encounter.  No orders of the  defined types were placed in this encounter.   Face-to-face time spent with patient was *** minutes. Greater than 50% of time was spent in counseling and coordination of care.  Follow-Up Instructions: No follow-ups on file.   Earnestine Mealing, CMA  Note - This record has been created using Editor, commissioning.  Chart creation errors have been sought, but may not always  have been located. Such creation errors do not reflect on  the standard of medical care.

## 2020-01-15 DIAGNOSIS — J301 Allergic rhinitis due to pollen: Secondary | ICD-10-CM | POA: Diagnosis not present

## 2020-01-15 DIAGNOSIS — F32A Depression, unspecified: Secondary | ICD-10-CM | POA: Diagnosis not present

## 2020-01-15 DIAGNOSIS — E063 Autoimmune thyroiditis: Secondary | ICD-10-CM | POA: Diagnosis not present

## 2020-01-15 DIAGNOSIS — R5383 Other fatigue: Secondary | ICD-10-CM | POA: Diagnosis not present

## 2020-01-15 DIAGNOSIS — J3089 Other allergic rhinitis: Secondary | ICD-10-CM | POA: Diagnosis not present

## 2020-01-15 DIAGNOSIS — D849 Immunodeficiency, unspecified: Secondary | ICD-10-CM | POA: Diagnosis not present

## 2020-01-15 DIAGNOSIS — E038 Other specified hypothyroidism: Secondary | ICD-10-CM | POA: Diagnosis not present

## 2020-01-15 DIAGNOSIS — J3081 Allergic rhinitis due to animal (cat) (dog) hair and dander: Secondary | ICD-10-CM | POA: Diagnosis not present

## 2020-01-15 DIAGNOSIS — K754 Autoimmune hepatitis: Secondary | ICD-10-CM | POA: Diagnosis not present

## 2020-01-26 ENCOUNTER — Ambulatory Visit: Payer: Medicare Other | Admitting: Rheumatology

## 2020-01-26 DIAGNOSIS — M19041 Primary osteoarthritis, right hand: Secondary | ICD-10-CM

## 2020-01-26 DIAGNOSIS — Z8709 Personal history of other diseases of the respiratory system: Secondary | ICD-10-CM

## 2020-01-26 DIAGNOSIS — K449 Diaphragmatic hernia without obstruction or gangrene: Secondary | ICD-10-CM

## 2020-01-26 DIAGNOSIS — E559 Vitamin D deficiency, unspecified: Secondary | ICD-10-CM

## 2020-01-26 DIAGNOSIS — M5134 Other intervertebral disc degeneration, thoracic region: Secondary | ICD-10-CM

## 2020-01-26 DIAGNOSIS — M5136 Other intervertebral disc degeneration, lumbar region: Secondary | ICD-10-CM

## 2020-01-26 DIAGNOSIS — C50411 Malignant neoplasm of upper-outer quadrant of right female breast: Secondary | ICD-10-CM

## 2020-01-26 DIAGNOSIS — M5030 Other cervical disc degeneration, unspecified cervical region: Secondary | ICD-10-CM

## 2020-01-26 DIAGNOSIS — Z86010 Personal history of colonic polyps: Secondary | ICD-10-CM

## 2020-01-26 DIAGNOSIS — Z8639 Personal history of other endocrine, nutritional and metabolic disease: Secondary | ICD-10-CM

## 2020-01-26 DIAGNOSIS — M8589 Other specified disorders of bone density and structure, multiple sites: Secondary | ICD-10-CM

## 2020-01-26 DIAGNOSIS — R768 Other specified abnormal immunological findings in serum: Secondary | ICD-10-CM

## 2020-01-26 DIAGNOSIS — K754 Autoimmune hepatitis: Secondary | ICD-10-CM

## 2020-01-26 DIAGNOSIS — Z79899 Other long term (current) drug therapy: Secondary | ICD-10-CM

## 2020-01-26 DIAGNOSIS — Z8719 Personal history of other diseases of the digestive system: Secondary | ICD-10-CM

## 2020-01-26 DIAGNOSIS — M19071 Primary osteoarthritis, right ankle and foot: Secondary | ICD-10-CM

## 2020-02-01 ENCOUNTER — Other Ambulatory Visit: Payer: Self-pay

## 2020-02-01 ENCOUNTER — Other Ambulatory Visit: Payer: Medicare Other

## 2020-02-01 DIAGNOSIS — K754 Autoimmune hepatitis: Secondary | ICD-10-CM | POA: Diagnosis not present

## 2020-02-01 DIAGNOSIS — R5383 Other fatigue: Secondary | ICD-10-CM | POA: Diagnosis not present

## 2020-02-02 NOTE — Progress Notes (Signed)
Office Visit Note  Patient: Cheryl Perry             Date of Birth: [birth year]           MRN: [id number]             PCP: Sharion Balloon, FNP Referring: Sharion Balloon, FNP Visit Date: 02/03/2020 Occupation: @GUAROCC @  Subjective:  Joint stiffness and pain.   History of Present Illness: Cheryl Perry is a 75 y.o. female with history of autoimmune hepatitis, osteoarthritis and osteopenia.  She states she continues to have some discomfort in her hands and decreased grip strength.  She has been experiencing discomfort in her left knee which she describes over the medial aspect of her knee.  She states sometimes her knee wakes her up in the middle of the night.  The discomfort from this disease in her cervical, thoracic and lumbar spine is manageable.  She states she was started on CellCept for autoimmune hepatitis which has been very helpful.  She is recently reduced the dose of CellCept 1 g twice daily to 1 g in the morning and 500 at bedtime due to insomnia.  She has been sleeping better.  Repeat labs are pending.  Activities of Daily Living:  Patient reports morning stiffness for 5-10 minutes.   Patient Reports nocturnal pain.  Difficulty dressing/grooming: Denies Difficulty climbing stairs: Denies Difficulty getting out of chair: Denies Difficulty using hands for taps, buttons, cutlery, and/or writing: Reports  Review of Systems  Constitutional: Positive for fatigue.  HENT: Negative for mouth sores, mouth dryness and nose dryness.   Eyes: Negative for pain, itching, visual disturbance and dryness.  Respiratory: Positive for shortness of breath. Negative for cough, hemoptysis and difficulty breathing.   Cardiovascular: Negative for chest pain, palpitations and swelling in legs/feet.  Gastrointestinal: Negative for abdominal pain, blood in stool, constipation and diarrhea.  Endocrine: Negative for increased urination.  Genitourinary: Negative for painful urination.   Musculoskeletal: Positive for arthralgias, joint pain, joint swelling and morning stiffness. Negative for myalgias, muscle weakness, muscle tenderness and myalgias.  Skin: Negative for color change, rash and redness.  Allergic/Immunologic: Negative for susceptible to infections.  Neurological: Positive for dizziness and weakness. Negative for numbness, headaches and memory loss.  Hematological: Negative for swollen glands.  Psychiatric/Behavioral: Negative for confusion and sleep disturbance.    PMFS History:  Patient Active Problem List   Diagnosis Date Noted  . Autoimmune hepatitis ([place]) 01/12/2019  . Elevated liver function tests 12/25/2017  . H/O adenomatous polyp of colon 12/12/2017  . Malignant neoplasm of upper-outer quadrant of right breast in female, estrogen receptor positive ([place]) 07/12/2017  . Back pain 12/14/2016  . Overweight (BMI 25.0-29.9) 06/14/2015  . Gastric ulceration   . Hiatal hernia   . History of lower GI bleeding   . UGI bleed 01/27/2015  . Vitamin D deficiency 06/15/2014  . GERD (gastroesophageal reflux disease) 06/15/2014  . Osteopenia 09/02/2013  . Hypothyroidism 06/05/2012  . HLD (hyperlipidemia) 06/05/2012  . Asthma 06/05/2012    Past Medical History:  Diagnosis Date  . Allergy   . Arthritis    back painherniated disc lumbar  . Asthma   . Autoimmune hepatitis ([place])   . Back pain   . Cancer ([place]) 07/2017   right breast cancer, lumpectomy and 20 sessions of XRT  . Complication of anesthesia   . GERD (gastroesophageal reflux disease)   . Hiatal hernia   . Hyperlipidemia   . Hypothyroidism   .  Osteopenia   . PONV (postoperative nausea and vomiting)    asks for scop patch  . Thyroid disease   . Vasculopathy    lymphatic    Family History  Problem Relation Age of Onset  . Stroke Mother   . Hypertension Mother   . Alzheimer's disease Mother   . Heart disease Sister   . Hypertension Sister   . Heart attack Sister        Psychologist, forensic   .  Arthritis Daughter        very bad MVA   . Hypertension Son   . Seizures Son   . Colon cancer Neg Hx   . Liver cancer Neg Hx   . Pancreatic disease Neg Hx    Past Surgical History:  Procedure Laterality Date  . BREAST LUMPECTOMY WITH RADIOACTIVE SEED AND SENTINEL LYMPH NODE BIOPSY Right 08/22/2017   Procedure: BREAST LUMPECTOMY WITH RADIOACTIVE SEED AND SENTINEL LYMPH NODE BIOPSY;  Surgeon: Erroll Luna, MD;  Location: [facility];  Service: General;  Laterality: Right;  . COLONOSCOPY  2015   Dr. Earlean Shawl: Hemorrhoids, diverticulosis.  Next colonoscopy in 5 years due to previous history of adenomatous colon polyps  . COLONOSCOPY N/A 11/18/2019   Procedure: COLONOSCOPY;  Surgeon: Daneil Dolin, MD;  [facility];  Service: Endoscopy;  Laterality: N/A;  9:30  . CYSTOSCOPY    . cystscopy    . ESOPHAGOGASTRODUODENOSCOPY N/A 01/28/2015   Dr.Rourk- 3 small prepyloric/gastric ulcers- likely culprits causing bleeding. hiatal hernia, small gastic polyps not manipulated.  . ESOPHAGOGASTRODUODENOSCOPY N/A 05/26/2015   Dr. Gala Romney: Small hiatal hernia, previous gastric ulcers completely healed.  Marland Kitchen KNEE SURGERY    . LIVER BIOPSY  10/09/2018  . TONSILLECTOMY    . TUBAL LIGATION     Social History   Social History Narrative  . Not on file   Immunization History  Administered Date(s) Administered  . Hepatitis A 04/03/2016  . Influenza, High Dose Seasonal PF 10/11/2015, 10/10/2016, 10/14/2017, 10/17/2018  . Influenza,inj,Quad PF,6+ Mos 10/18/2014  . Influenza,inj,quad, With Preservative 10/09/2018  . Influenza-Unspecified 12/09/2012, 11/09/2013  . Moderna Sars-Covid-2 Vaccination 02/20/2019, 03/21/2019, 09/25/2019  . Pneumococcal Conjugate-13 12/09/2013  . Pneumococcal Polysaccharide-23 05/04/2011  . Tdap 06/09/2003, 06/09/2013  . Typhoid Live 04/03/2016  . Zoster 04/21/2010     Objective: Vital Signs: BP 112/80 (BP Location: Left Arm, Patient Position: Sitting,  Cuff Size: Normal)   Pulse (!) 102   Wt 172 lb 6.4 oz (78.2 kg)   BMI 27.83 kg/m    Physical Exam Vitals and nursing note reviewed.  Constitutional:      Appearance: She is well-developed and well-nourished.  HENT:     Head: Normocephalic and atraumatic.  Eyes:     Extraocular Movements: EOM normal.     Conjunctiva/sclera: Conjunctivae normal.  Cardiovascular:     Rate and Rhythm: Normal rate and regular rhythm.     Pulses: Intact distal pulses.     Heart sounds: Normal heart sounds.  Pulmonary:     Effort: Pulmonary effort is normal.     Breath sounds: Normal breath sounds.  Abdominal:     General: Bowel sounds are normal.     Palpations: Abdomen is soft.  Musculoskeletal:     Cervical back: Normal range of motion.  Lymphadenopathy:     Cervical: No cervical adenopathy.  Skin:    General: Skin is warm and dry.     Capillary Refill: Capillary refill takes less than 2 seconds.  Neurological:     Mental Status: She is alert and oriented to person, place, and time.  Psychiatric:        Mood and Affect: Mood and affect normal.        Behavior: Behavior normal.      Musculoskeletal Exam: She has some limitation with lateral rotation.  Flexion and extension was complete.  She has mild thoracic kyphosis.  There was no point tenderness over thoracic or lumbar spine.  Shoulder joints, elbow joints, wrist joints with good range of motion.  She has bilateral PIP and DIP thickening with subluxation of her right first PIP joint.  No synovitis was noted.  Hip joints and knee joints with good range of motion.  She had no tenderness warmth or swelling over her left knee joint.  She has bilateral bunions without any tenderness.  CDAI Exam: CDAI Score: -- Patient Global: --; Provider Global: -- Swollen: --; Tender: -- Joint Exam 02/03/2020   No joint exam has been documented for this visit   There is currently no information documented on the homunculus. Go to the Rheumatology  activity and complete the homunculus joint exam.  Investigation: No additional findings.  Imaging: No results found.  Recent Labs: Lab Results  Component Value Date   WBC 5.3 09/03/2019   HGB 14.3 09/03/2019   PLT 198 09/03/2019   NA 143 09/03/2019   K 4.1 09/03/2019   CL 105 09/03/2019   CO2 24 09/03/2019   GLUCOSE 98 09/03/2019   BUN 11 09/03/2019   CREATININE 0.80 09/03/2019   BILITOT 0.4 09/03/2019   ALKPHOS 73 09/03/2019   AST 21 09/03/2019   ALT 17 09/03/2019   PROT 6.4 09/03/2019   ALBUMIN 4.4 09/03/2019   CALCIUM 9.4 09/03/2019   GFRAA 84 09/03/2019    Speciality Comments: No specialty comments available.  Procedures:  No procedures performed Allergies: Azathioprine, Statins, Sulfa antibiotics, Naproxen, Lipitor [atorvastatin], Premarin [conjugated estrogens], Ampicillin, Avelox [moxifloxacin hcl in nacl], Azithromycin, Bactrim [sulfamethoxazole-trimethoprim], Crestor [rosuvastatin calcium], Doxycycline, Levaquin [levofloxacin in d5w], Livalo [pitavastatin], Macrodantin [nitrofurantoin macrocrystal], and Trental [pentoxifylline er]   Assessment / Plan:     Visit Diagnoses: Positive ANA (antinuclear antibody) - AVISE index -0.4, ANA titer negative, RF 18, TPO antibody positive.  She had no synovitis on examination.  Autoimmune hepatitis ([place]) - Bx positive on 10/02/18.  She is followed by Dr. Nolon Lennert at [facility].  She had recent labs at [facility] in January 2022 which are reviewed.  CBC and CMP were within normal limits.  High risk medication use - Cellcept 500 mg 2 tablets p.o. every morning and 1 tablet p.o. q. at bedtime prescribed by GI.(discontinued Imuran-due to developing AZA induced cholestatic liver injury and upper GI SE).  Her labs are followed at [facility].  Primary osteoarthritis of both hands-she has severe osteoarthritis with PIP and DIP prominence and subluxation of some of the joints.  Joint protection was discussed.  Chronic pain of left  knee-patient has been experiencing discomfort in her left knee.  I offered x-rays which she declined.  Have given her a handout on knee joint muscle strengthening exercises.    Primary osteoarthritis of both feet-she has been using proper fitting shoes which has been helpful.  DDD (degenerative disc disease), cervical-she has some limitation with range of motion.  C-spine exercises were demonstrated.  DDD (degenerative disc disease), thoracic-she had no tenderness over thoracic region.  DDD (degenerative disc disease), lumbar-she had good range of motion of her lumbar spine.  Osteopenia of multiple sites - DXA 09/02/2013.  She states she has been getting regular DEXA scans from her PCP.  I do not have a copy available.  Vitamin D deficiency-she is on vitamin D.  Malignant neoplasm of upper-outer quadrant of right breast in female, estrogen receptor positive ([place])  Other medical problems are listed as follows:  History of lower GI bleeding  H/O adenomatous polyp of colon  History of asthma  History of hyperlipidemia  History of gastroesophageal reflux (GERD)  History of hypothyroidism  Hiatal hernia  Educated about COVID-19 virus infection-she is fully vaccinated against COVID-19 and also received a booster.  I made her aware that people on CellCept do not get a very good response to the vaccination.  She may consider getting a booster dose, 6 months after the last vaccination.  She will also be a candidate for monoclonal antibody infusion in case she develops COVID-19 infection.  Use of mask, social distancing and hand hygiene was discussed.  Orders: No orders of the defined types were placed in this encounter.  No orders of the defined types were placed in this encounter.    Follow-Up Instructions: Return in about 1 year (around 02/02/2021) for Osteoarthritis.   Bo Merino, MD  Note - This record has been created using Editor, commissioning.  Chart creation errors have  been sought, but may not always  have been located. Such creation errors do not reflect on  the standard of medical care.

## 2020-02-03 ENCOUNTER — Encounter: Payer: Self-pay | Admitting: Rheumatology

## 2020-02-03 ENCOUNTER — Other Ambulatory Visit: Payer: Self-pay

## 2020-02-03 ENCOUNTER — Ambulatory Visit (INDEPENDENT_AMBULATORY_CARE_PROVIDER_SITE_OTHER): Payer: Medicare Other | Admitting: Rheumatology

## 2020-02-03 VITALS — BP 112/80 | HR 102 | Wt 172.4 lb

## 2020-02-03 DIAGNOSIS — M5136 Other intervertebral disc degeneration, lumbar region: Secondary | ICD-10-CM | POA: Diagnosis not present

## 2020-02-03 DIAGNOSIS — R7689 Other specified abnormal immunological findings in serum: Secondary | ICD-10-CM

## 2020-02-03 DIAGNOSIS — M5030 Other cervical disc degeneration, unspecified cervical region: Secondary | ICD-10-CM | POA: Diagnosis not present

## 2020-02-03 DIAGNOSIS — M51369 Other intervertebral disc degeneration, lumbar region without mention of lumbar back pain or lower extremity pain: Secondary | ICD-10-CM

## 2020-02-03 DIAGNOSIS — C50411 Malignant neoplasm of upper-outer quadrant of right female breast: Secondary | ICD-10-CM

## 2020-02-03 DIAGNOSIS — K754 Autoimmune hepatitis: Secondary | ICD-10-CM | POA: Diagnosis not present

## 2020-02-03 DIAGNOSIS — M5134 Other intervertebral disc degeneration, thoracic region: Secondary | ICD-10-CM | POA: Diagnosis not present

## 2020-02-03 DIAGNOSIS — R768 Other specified abnormal immunological findings in serum: Secondary | ICD-10-CM

## 2020-02-03 DIAGNOSIS — Z79899 Other long term (current) drug therapy: Secondary | ICD-10-CM | POA: Diagnosis not present

## 2020-02-03 DIAGNOSIS — Z8709 Personal history of other diseases of the respiratory system: Secondary | ICD-10-CM

## 2020-02-03 DIAGNOSIS — Z86010 Personal history of colonic polyps: Secondary | ICD-10-CM

## 2020-02-03 DIAGNOSIS — G8929 Other chronic pain: Secondary | ICD-10-CM

## 2020-02-03 DIAGNOSIS — M19041 Primary osteoarthritis, right hand: Secondary | ICD-10-CM | POA: Diagnosis not present

## 2020-02-03 DIAGNOSIS — K449 Diaphragmatic hernia without obstruction or gangrene: Secondary | ICD-10-CM

## 2020-02-03 DIAGNOSIS — Z860101 Personal history of adenomatous and serrated colon polyps: Secondary | ICD-10-CM

## 2020-02-03 DIAGNOSIS — E559 Vitamin D deficiency, unspecified: Secondary | ICD-10-CM | POA: Diagnosis not present

## 2020-02-03 DIAGNOSIS — Z8639 Personal history of other endocrine, nutritional and metabolic disease: Secondary | ICD-10-CM

## 2020-02-03 DIAGNOSIS — Z8719 Personal history of other diseases of the digestive system: Secondary | ICD-10-CM | POA: Diagnosis not present

## 2020-02-03 DIAGNOSIS — M8589 Other specified disorders of bone density and structure, multiple sites: Secondary | ICD-10-CM | POA: Diagnosis not present

## 2020-02-03 DIAGNOSIS — M19072 Primary osteoarthritis, left ankle and foot: Secondary | ICD-10-CM

## 2020-02-03 DIAGNOSIS — M19071 Primary osteoarthritis, right ankle and foot: Secondary | ICD-10-CM | POA: Diagnosis not present

## 2020-02-03 DIAGNOSIS — M25562 Pain in left knee: Secondary | ICD-10-CM

## 2020-02-03 DIAGNOSIS — Z170 Estrogen receptor positive status [ER+]: Secondary | ICD-10-CM

## 2020-02-03 DIAGNOSIS — Z7189 Other specified counseling: Secondary | ICD-10-CM

## 2020-02-03 DIAGNOSIS — M19042 Primary osteoarthritis, left hand: Secondary | ICD-10-CM

## 2020-02-03 NOTE — Patient Instructions (Signed)
Journal for Nurse Practitioners, 15(4), 263-267. Retrieved October 14, 2017 from [email or web address].com/nursing">  Knee Exercises Ask your health care provider which exercises are safe for you. Do exercises exactly as told by your health care provider and adjust them as directed. It is normal to feel mild stretching, pulling, tightness, or discomfort as you do these exercises. Stop right away if you feel sudden pain or your pain gets worse. Do not begin these exercises until told by your health care provider. Stretching and range-of-motion exercises These exercises warm up your muscles and joints and improve the movement and flexibility of your knee. These exercises also help to relieve pain and swelling. Knee extension, prone 1. Lie on your abdomen (prone position) on a bed. 2. Place your left / right knee just beyond the edge of the surface so your knee is not on the bed. You can put a towel under your left / right thigh just above your kneecap for comfort. 3. Relax your leg muscles and allow gravity to straighten your knee (extension). You should feel a stretch behind your left / right knee. 4. Hold this position for __________ seconds. 5. Scoot up so your knee is supported between repetitions. Repeat __________ times. Complete this exercise __________ times a day. Knee flexion, active 1. Lie on your back with both legs straight. If this causes back discomfort, bend your left / right knee so your foot is flat on the floor. 2. Slowly slide your left / right heel back toward your buttocks. Stop when you feel a gentle stretch in the front of your knee or thigh (flexion). 3. Hold this position for __________ seconds. 4. Slowly slide your left / right heel back to the starting position. Repeat __________ times. Complete this exercise __________ times a day.   Quadriceps stretch, prone 1. Lie on your abdomen on a firm surface, such as a bed or padded floor. 2. Bend your left / right knee and hold  your ankle. If you cannot reach your ankle or pant leg, loop a belt around your foot and grab the belt instead. 3. Gently pull your heel toward your buttocks. Your knee should not slide out to the side. You should feel a stretch in the front of your thigh and knee (quadriceps). 4. Hold this position for __________ seconds. Repeat __________ times. Complete this exercise __________ times a day.   Hamstring, supine 1. Lie on your back (supine position). 2. Loop a belt or towel over the ball of your left / right foot. The ball of your foot is on the walking surface, right under your toes. 3. Straighten your left / right knee and slowly pull on the belt to raise your leg until you feel a gentle stretch behind your knee (hamstring). ? Do not let your knee bend while you do this. ? Keep your other leg flat on the floor. 4. Hold this position for __________ seconds. Repeat __________ times. Complete this exercise __________ times a day. Strengthening exercises These exercises build strength and endurance in your knee. Endurance is the ability to use your muscles for a long time, even after they get tired. Quadriceps, isometric This exercise stretches the muscles in front of your thigh (quadriceps) without moving your knee joint (isometric). 1. Lie on your back with your left / right leg extended and your other knee bent. Put a rolled towel or small pillow under your knee if told by your health care provider. 2. Slowly tense the muscles in the front of your   left / right thigh. You should see your kneecap slide up toward your hip or see increased dimpling just above the knee. This motion will push the back of the knee toward the floor. 3. For __________ seconds, hold the muscle as tight as you can without increasing your pain. 4. Relax the muscles slowly and completely. Repeat __________ times. Complete this exercise __________ times a day.   Straight leg raises This exercise stretches the muscles in  front of your thigh (quadriceps) and the muscles that move your hips (hip flexors). 1. Lie on your back with your left / right leg extended and your other knee bent. 2. Tense the muscles in the front of your left / right thigh. You should see your kneecap slide up or see increased dimpling just above the knee. Your thigh may even shake a bit. 3. Keep these muscles tight as you raise your leg 4-6 inches (10-15 cm) off the floor. Do not let your knee bend. 4. Hold this position for __________ seconds. 5. Keep these muscles tense as you lower your leg. 6. Relax your muscles slowly and completely after each repetition. Repeat __________ times. Complete this exercise __________ times a day. Hamstring, isometric 1. Lie on your back on a firm surface. 2. Bend your left / right knee about __________ degrees. 3. Dig your left / right heel into the surface as if you are trying to pull it toward your buttocks. Tighten the muscles in the back of your thighs (hamstring) to "dig" as hard as you can without increasing any pain. 4. Hold this position for __________ seconds. 5. Release the tension gradually and allow your muscles to relax completely for __________ seconds after each repetition. Repeat __________ times. Complete this exercise __________ times a day. Hamstring curls If told by your health care provider, do this exercise while wearing ankle weights. Begin with __________ lb weights. Then increase the weight by 1 lb (0.5 kg) increments. Do not wear ankle weights that are more than __________ lb. 1. Lie on your abdomen with your legs straight. 2. Bend your left / right knee as far as you can without feeling pain. Keep your hips flat against the floor. 3. Hold this position for __________ seconds. 4. Slowly lower your leg to the starting position. Repeat __________ times. Complete this exercise __________ times a day.   Squats This exercise strengthens the muscles in front of your thigh and knee  (quadriceps). 1. Stand in front of a table, with your feet and knees pointing straight ahead. You may rest your hands on the table for balance but not for support. 2. Slowly bend your knees and lower your hips like you are going to sit in a chair. ? Keep your weight over your heels, not over your toes. ? Keep your lower legs upright so they are parallel with the table legs. ? Do not let your hips go lower than your knees. ? Do not bend lower than told by your health care provider. ? If your knee pain increases, do not bend as low. 3. Hold the squat position for __________ seconds. 4. Slowly push with your legs to return to standing. Do not use your hands to pull yourself to standing. Repeat __________ times. Complete this exercise __________ times a day. Wall slides This exercise strengthens the muscles in front of your thigh and knee (quadriceps). 1. Lean your back against a smooth wall or door, and walk your feet out 18-24 inches (46-61 cm) from it. 2.   Place your feet hip-width apart. 3. Slowly slide down the wall or door until your knees bend __________ degrees. Keep your knees over your heels, not over your toes. Keep your knees in line with your hips. 4. Hold this position for __________ seconds. Repeat __________ times. Complete this exercise __________ times a day.   Straight leg raises This exercise strengthens the muscles that rotate the leg at the hip and move it away from your body (hip abductors). 1. Lie on your side with your left / right leg in the top position. Lie so your head, shoulder, knee, and hip line up. You may bend your bottom knee to help you keep your balance. 2. Roll your hips slightly forward so your hips are stacked directly over each other and your left / right knee is facing forward. 3. Leading with your heel, lift your top leg 4-6 inches (10-15 cm). You should feel the muscles in your outer hip lifting. ? Do not let your foot drift forward. ? Do not let your  knee roll toward the ceiling. 4. Hold this position for __________ seconds. 5. Slowly return your leg to the starting position. 6. Let your muscles relax completely after each repetition. Repeat __________ times. Complete this exercise __________ times a day.   Straight leg raises This exercise stretches the muscles that move your hips away from the front of the pelvis (hip extensors). 1. Lie on your abdomen on a firm surface. You can put a pillow under your hips if that is more comfortable. 2. Tense the muscles in your buttocks and lift your left / right leg about 4-6 inches (10-15 cm). Keep your knee straight as you lift your leg. 3. Hold this position for __________ seconds. 4. Slowly lower your leg to the starting position. 5. Let your leg relax completely after each repetition. Repeat __________ times. Complete this exercise __________ times a day. This information is not intended to replace advice given to you by your health care provider. Make sure you discuss any questions you have with your health care provider. Document Revised: 10/15/2017 Document Reviewed: 10/15/2017 Elsevier Patient Education  2021 [employer]  

## 2020-02-15 DIAGNOSIS — C50911 Malignant neoplasm of unspecified site of right female breast: Secondary | ICD-10-CM | POA: Diagnosis not present

## 2020-03-07 ENCOUNTER — Ambulatory Visit (INDEPENDENT_AMBULATORY_CARE_PROVIDER_SITE_OTHER): Payer: Medicare Other | Admitting: Family

## 2020-03-07 ENCOUNTER — Encounter: Payer: Self-pay | Admitting: Family

## 2020-03-07 ENCOUNTER — Other Ambulatory Visit: Payer: Self-pay

## 2020-03-07 ENCOUNTER — Other Ambulatory Visit (INDEPENDENT_AMBULATORY_CARE_PROVIDER_SITE_OTHER): Payer: Medicare Other

## 2020-03-07 VITALS — BP 114/69 | HR 58 | Temp 96.9°F | Ht 66.0 in | Wt 173.4 lb

## 2020-03-07 DIAGNOSIS — E785 Hyperlipidemia, unspecified: Secondary | ICD-10-CM

## 2020-03-07 DIAGNOSIS — Z780 Asymptomatic menopausal state: Secondary | ICD-10-CM

## 2020-03-07 DIAGNOSIS — G8929 Other chronic pain: Secondary | ICD-10-CM | POA: Diagnosis not present

## 2020-03-07 DIAGNOSIS — M545 Low back pain, unspecified: Secondary | ICD-10-CM | POA: Diagnosis not present

## 2020-03-07 DIAGNOSIS — K219 Gastro-esophageal reflux disease without esophagitis: Secondary | ICD-10-CM

## 2020-03-07 DIAGNOSIS — E663 Overweight: Secondary | ICD-10-CM | POA: Diagnosis not present

## 2020-03-07 DIAGNOSIS — E038 Other specified hypothyroidism: Secondary | ICD-10-CM

## 2020-03-07 DIAGNOSIS — K754 Autoimmune hepatitis: Secondary | ICD-10-CM

## 2020-03-07 DIAGNOSIS — J45909 Unspecified asthma, uncomplicated: Secondary | ICD-10-CM | POA: Diagnosis not present

## 2020-03-07 DIAGNOSIS — E559 Vitamin D deficiency, unspecified: Secondary | ICD-10-CM | POA: Diagnosis not present

## 2020-03-07 MED ORDER — LEVOTHYROXINE SODIUM 75 MCG PO TABS: ORAL_TABLET | ORAL | 3 refills | Status: DC

## 2020-03-07 NOTE — Patient Instructions (Signed)
Hypothyroidism  Hypothyroidism is when the thyroid gland does not make enough of certain hormones (it is underactive). The thyroid gland is a small gland located in the lower front part of the neck, just in front of the windpipe (trachea). This gland makes hormones that help control how the body uses food for energy (metabolism) as well as how the heart and brain function. These hormones also play a role in keeping your bones strong. When the thyroid is underactive, it produces too little of the hormones thyroxine (T4) and triiodothyronine (T3). What are the causes? This condition may be caused by:  Hashimoto's disease. This is a disease in which the body's disease-fighting system (immune system) attacks the thyroid gland. This is the most common cause.  Viral infections.  Pregnancy.  Certain medicines.  Birth defects.  Past radiation treatments to the head or neck for cancer.  Past treatment with radioactive iodine.  Past exposure to radiation in the environment.  Past surgical removal of part or all of the thyroid.  Problems with a gland in the center of the brain (pituitary gland).  Lack of enough iodine in the diet. What increases the risk? You are more likely to develop this condition if:  You are female.  You have a family history of thyroid conditions.  You use a medicine called lithium.  You take medicines that affect the immune system (immunosuppressants). What are the signs or symptoms? Symptoms of this condition include:  Feeling as though you have no energy (lethargy).  Not being able to tolerate cold.  Weight gain that is not explained by a change in diet or exercise habits.  Lack of appetite.  Dry skin.  Coarse hair.  Menstrual irregularity.  Slowing of thought processes.  Constipation.  Sadness or depression. How is this diagnosed? This condition may be diagnosed based on:  Your symptoms, your medical history, and a physical exam.  Blood  tests. You may also have imaging tests, such as an ultrasound or MRI. How is this treated? This condition is treated with medicine that replaces the thyroid hormones that your body does not make. After you begin treatment, it may take several weeks for symptoms to go away. Follow these instructions at home:  Take over-the-counter and prescription medicines only as told by your health care provider.  If you start taking any new medicines, tell your health care provider.  Keep all follow-up visits as told by your health care provider. This is important. ? As your condition improves, your dosage of thyroid hormone medicine may change. ? You will need to have blood tests regularly so that your health care provider can monitor your condition. Contact a health care provider if:  Your symptoms do not get better with treatment.  You are taking thyroid hormone replacement medicine and you: ? Sweat a lot. ? Have tremors. ? Feel anxious. ? Lose weight rapidly. ? Cannot tolerate heat. ? Have emotional swings. ? Have diarrhea. ? Feel weak. Get help right away if you have:  Chest pain.  An irregular heartbeat.  A rapid heartbeat.  Difficulty breathing. Summary  Hypothyroidism is when the thyroid gland does not make enough of certain hormones (it is underactive).  When the thyroid is underactive, it produces too little of the hormones thyroxine (T4) and triiodothyronine (T3).  The most common cause is Hashimoto's disease, a disease in which the body's disease-fighting system (immune system) attacks the thyroid gland. The condition can also be caused by viral infections, medicine, pregnancy, or   past radiation treatment to the head or neck.  Symptoms may include weight gain, dry skin, constipation, feeling as though you do not have energy, and not being able to tolerate cold.  This condition is treated with medicine to replace the thyroid hormones that your body does not make. This  information is not intended to replace advice given to you by your health care provider. Make sure you discuss any questions you have with your health care provider. Document Revised: 09/25/2019 Document Reviewed: 09/10/2019 Elsevier Patient Education  2021 [employer].  

## 2020-03-07 NOTE — Progress Notes (Signed)
Subjective:    Patient ID: Cheryl Perry, female    DOB: Jan 24, 1945, 75 y.o.   MRN: [id number]  Chief Complaint  Patient presents with  . Medical Management of Chronic Issues  . 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   Pt is followed by allergen specialists annually for her asthma.PT is followed by Ortho as needed for chronic back pain.  She is followed by Oncologists every 6 months for right breast cancer. She completed radiation in 10/29/17. Asthma There is no cough, shortness of breath or wheezing. This is a chronic problem. The current episode started more than 1 year ago. The problem occurs intermittently. The problem has been waxing and waning. Associated symptoms include heartburn. She reports moderate improvement on treatment. Her past medical history is significant for asthma.  Gastroesophageal Reflux She complains of belching and heartburn. She reports no coughing or no wheezing. This is a chronic problem. The current episode started more than 1 year ago. The problem occurs occasionally. The problem has been waxing and waning. Associated symptoms include fatigue. She has tried a PPI for the symptoms. The treatment provided moderate relief.  Thyroid Problem Presents for follow-up visit. Symptoms include dry skin, fatigue and hair loss. Patient reports no anxiety, constipation or diarrhea. The symptoms have been stable. Her past medical history is significant for hyperlipidemia.  Hyperlipidemia This is a chronic problem. The current episode started more than 1 year ago. Exacerbating diseases include obesity. Pertinent negatives include no shortness of breath. Current antihyperlipidemic treatment includes diet change. The current treatment provides mild improvement of  lipids. Risk factors for coronary artery disease include dyslipidemia, a sedentary lifestyle and post-menopausal.  Back Pain This is a chronic problem. The current episode started more than 1 year ago. The problem occurs intermittently. The problem has been waxing and waning since onset. The pain is present in the lumbar spine. The pain is at a severity of 1/10. The pain is mild. She has tried bed rest for the symptoms. The treatment provided mild relief.      Review of Systems  Constitutional: Positive for fatigue.  Respiratory: Negative for cough, shortness of breath and wheezing.   Gastrointestinal: Positive for heartburn. Negative for constipation and diarrhea.  Musculoskeletal: Positive for back pain.  Psychiatric/Behavioral: The patient is not nervous/anxious.   All other systems reviewed and are negative.      Objective:   Physical Exam Vitals reviewed.  Constitutional:      General: She is not in acute distress.    Appearance: She is well-developed and well-nourished.  HENT:     Head: Normocephalic and atraumatic.     Right Ear: Tympanic membrane normal.     Left Ear: Tympanic membrane normal.     Mouth/Throat:     Mouth: Oropharynx is clear and moist.  Eyes:     Pupils: Pupils are equal, round, and reactive to light.  Neck:     Thyroid: No thyromegaly.  Cardiovascular:     Rate and Rhythm: Normal rate and regular rhythm.     Pulses: Intact distal pulses.     Heart sounds: Normal heart sounds. No murmur heard.   Pulmonary:     Effort: Pulmonary effort is normal. No respiratory distress.     Breath sounds: Normal breath sounds. No wheezing.  Abdominal:     General: Bowel sounds are normal. There is no distension.     Palpations: Abdomen is soft.     Tenderness: There is no abdominal tenderness.  Musculoskeletal:        General: No tenderness or edema. Normal range of motion.     Cervical back: Normal range of motion and neck supple.  Skin:    General: Skin is  warm and dry.  Neurological:     Mental Status: She is alert and oriented to person, place, and time.     Cranial Nerves: No cranial nerve deficit.     Deep Tendon Reflexes: Reflexes are normal and symmetric.  Psychiatric:        Mood and Affect: Mood and affect normal.        Behavior: Behavior normal.        Thought Content: Thought content normal.        Judgment: Judgment normal.          BP 114/69   Pulse (!) 58   Temp (!) 96.9 F (36.1 C) (Temporal)   Ht 5' 6"  (1.676 m)   Wt 173 lb 6.4 oz (78.7 kg)   SpO2 98%   BMI 27.99 kg/m   Assessment & Plan:  Cheryl Perry comes in today with chief complaint of Medical Management of Chronic Issues and Asthma   Diagnosis and orders addressed:  1. Other specified hypothyroidism - levothyroxine (SYNTHROID) 75 MCG tablet; TAKE 1 TABLET (75 MCG TOTAL)  BY MOUTH DAILY BEFORE BREAKFAST.  Dispense: 90 tablet; Refill: 3 - CMP14+EGFR - TSH  2. Uncomplicated asthma, unspecified asthma severity, unspecified whether persistent - CMP14+EGFR  3. Gastroesophageal reflux disease without esophagitis - CMP14+EGFR  4. Autoimmune hepatitis ([place]) - CMP14+EGFR  5. Hyperlipidemia, unspecified hyperlipidemia type - CMP14+EGFR  6. Vitamin D deficiency - CMP14+EGFR  7. Overweight (BMI 25.0-29.9) - CMP14+EGFR  8. Chronic bilateral low back pain without sciatica - CMP14+EGFR  9. Post-menopause - CMP14+EGFR - DG [facility] DEXA   Labs pending Health Maintenance reviewed Diet and exercise encouraged  Follow up plan: 6 months    [name], FNP

## 2020-03-08 DIAGNOSIS — Z780 Asymptomatic menopausal state: Secondary | ICD-10-CM | POA: Diagnosis not present

## 2020-03-08 DIAGNOSIS — M85851 Other specified disorders of bone density and structure, right thigh: Secondary | ICD-10-CM | POA: Diagnosis not present

## 2020-03-08 LAB — CMP14+EGFR
ALT: 14 IU/L (ref 0–32)
AST: 21 IU/L (ref 0–40)
Albumin/Globulin Ratio: 2.4 — ABNORMAL HIGH (ref 1.2–2.2)
Albumin: 4.3 g/dL (ref 3.7–4.7)
Alkaline Phosphatase: 86 IU/L (ref 44–121)
BUN/Creatinine Ratio: 16 (ref 12–28)
BUN: 12 mg/dL (ref 8–27)
Bilirubin Total: 0.4 mg/dL (ref 0.0–1.2)
CO2: 21 mmol/L (ref 20–29)
Calcium: 9.3 mg/dL (ref 8.7–10.3)
Chloride: 106 mmol/L (ref 96–106)
Creatinine, Ser: 0.75 mg/dL (ref 0.57–1.00)
Globulin, Total: 1.8 g/dL (ref 1.5–4.5)
Glucose: 95 mg/dL (ref 65–99)
Potassium: 4.2 mmol/L (ref 3.5–5.2)
Sodium: 142 mmol/L (ref 134–144)
Total Protein: 6.1 g/dL (ref 6.0–8.5)
eGFR: 83 mL/min/{1.73_m2} (ref >59)

## 2020-03-08 LAB — TSH: TSH: 3.23 u[IU]/mL (ref 0.450–4.500)

## 2020-03-16 DIAGNOSIS — J3089 Other allergic rhinitis: Secondary | ICD-10-CM | POA: Diagnosis not present

## 2020-03-16 DIAGNOSIS — J3081 Allergic rhinitis due to animal (cat) (dog) hair and dander: Secondary | ICD-10-CM | POA: Diagnosis not present

## 2020-03-16 DIAGNOSIS — J301 Allergic rhinitis due to pollen: Secondary | ICD-10-CM | POA: Diagnosis not present

## 2020-03-18 DIAGNOSIS — J3089 Other allergic rhinitis: Secondary | ICD-10-CM | POA: Diagnosis not present

## 2020-03-18 DIAGNOSIS — J3081 Allergic rhinitis due to animal (cat) (dog) hair and dander: Secondary | ICD-10-CM | POA: Diagnosis not present

## 2020-03-18 DIAGNOSIS — J301 Allergic rhinitis due to pollen: Secondary | ICD-10-CM | POA: Diagnosis not present

## 2020-03-22 ENCOUNTER — Other Ambulatory Visit: Payer: Medicare Other

## 2020-03-22 ENCOUNTER — Other Ambulatory Visit: Payer: Self-pay

## 2020-03-22 DIAGNOSIS — K754 Autoimmune hepatitis: Secondary | ICD-10-CM | POA: Diagnosis not present

## 2020-03-24 DIAGNOSIS — L820 Inflamed seborrheic keratosis: Secondary | ICD-10-CM | POA: Diagnosis not present

## 2020-03-24 DIAGNOSIS — L814 Other melanin hyperpigmentation: Secondary | ICD-10-CM | POA: Diagnosis not present

## 2020-03-24 DIAGNOSIS — L821 Other seborrheic keratosis: Secondary | ICD-10-CM | POA: Diagnosis not present

## 2020-03-24 DIAGNOSIS — D225 Melanocytic nevi of trunk: Secondary | ICD-10-CM | POA: Diagnosis not present

## 2020-04-05 ENCOUNTER — Ambulatory Visit (INDEPENDENT_AMBULATORY_CARE_PROVIDER_SITE_OTHER): Payer: Medicare Other

## 2020-04-05 DIAGNOSIS — Z0000 Encounter for general adult medical examination without abnormal findings: Secondary | ICD-10-CM

## 2020-04-05 NOTE — Progress Notes (Signed)
MEDICARE ANNUAL WELLNESS VISIT  04/05/2020  Telephone Visit Disclaimer This Medicare AWV was conducted by telephone due to national recommendations for restrictions regarding the COVID-19 Pandemic (e.g. social distancing).  I verified, using two identifiers, that I am speaking with Cheryl Perry or their authorized healthcare agent. I discussed the limitations, risks, security, and privacy concerns of performing an evaluation and management service by telephone and the potential availability of an in-person appointment in the future. The patient expressed understanding and agreed to proceed.  Location of Patient: Home Location of Provider (nurse):  [facility]  Subjective:    Cheryl Perry is a 75 y.o. female patient of Hawks, Theador Hawthorne, FNP who had a Medicare Annual Wellness Visit today via telephone. [name] is Retired and lives with their spouse. She has two children, four grandchildren and six great grandchildren.  She reports that she is socially active and does interact with friends/family regularly. She is minimally physically active and enjoys being active in her church, reading, doing word games, and spending time with family.  Patient Care Team: Sharion Balloon, FNP as PCP - General (Family Medicine) Clent Jacks, MD as Consulting Physician (Ophthalmology) Gala Romney, Cristopher Estimable, MD as Consulting Physician (Gastroenterology) Suella Broad, MD as Consulting Physician (Physical Medicine and Rehabilitation) Iran Planas, MD as Consulting Physician (Orthopedic Surgery) Richmond Campbell, MD as Consulting Physician (Gastroenterology) Nicholas Lose, MD as Consulting Physician (Hematology and Oncology) Erroll Luna, MD as Consulting Physician (General Surgery) Kyung Rudd, MD as Consulting Physician (Radiation Oncology) Gardenia Phlegm, NP as Nurse Practitioner (Hematology and Oncology)  Advanced Directives 04/05/2020 11/18/2019 07/10/2019 12/28/2018 06/12/2018 01/02/2018  09/19/2017  Does Patient Have a Medical Advance Directive? Yes Yes No No Yes Yes Yes  Type of Paramedic of [place];Living will [place];Living will - - Living will;Healthcare Power of [place];Living will Living will  Does patient want to make changes to medical advance directive? No - Patient declined - - - Yes (MAU/Ambulatory/Procedural Areas - Information given) - -  Copy of [place] in Chart? No - copy requested No - copy requested - - - - -  Would patient like information on creating a medical advance directive? - - - - - [facility] Utilization Over the Past 12 Months: # of hospitalizations or ER visits: 1 # of surgeries: 0  Review of Systems    Patient reports that her overall health is better compared to last year.  History obtained from chart review and the patient  Patient Reported Readings (BP, Pulse, CBG, Weight, etc) none  Pain Assessment Pain : No/denies pain     Current Medications & Allergies (verified) Allergies as of 04/05/2020      Reactions   Azathioprine Other (See Comments)   Cholestatic liver injury    Statins Other (See Comments)   Elevated LFT's   Sulfa Antibiotics Hives   Naproxen Hives, Itching   Note - also has Ulcer - cant take    Lipitor [atorvastatin]    mylagias   Premarin [conjugated Estrogens] Itching   tightness in chest   Ampicillin Rash   Avelox [moxifloxacin Hcl In Nacl] Rash   Azithromycin Rash   Concern for DRESS after useage   Bactrim [sulfamethoxazole-trimethoprim] Hives   Crestor [rosuvastatin Calcium]    Elevated liver results?   Doxycycline Rash   Concern for DRESS after usage.   Levaquin [levofloxacin In D5w] Rash   Livalo [pitavastatin]  Other (See Comments)   myalgias   Macrodantin [nitrofurantoin Macrocrystal] Hives, Rash   Trental [pentoxifylline Er] Rash         Medication List       Accurate as of April 05, 2020   3:49 PM. If you have any questions, ask your nurse or doctor.        STOP taking these medications   ondansetron 4 MG disintegrating tablet Commonly known as: Zofran ODT     TAKE these medications   Advair HFA 115-21 MCG/ACT inhaler Generic drug: fluticasone-salmeterol 2 puffs   albuterol 108 (90 Base) MCG/ACT inhaler Commonly known as: Proventil HFA Inhale 2 puffs into the lungs every 6 (six) hours as needed for wheezing. May use only 3 times a month   calcium carbonate 500 MG chewable tablet Commonly known as: TUMS - dosed in mg elemental calcium Chew 1 tablet by mouth 3 (three) times daily as needed for indigestion or heartburn.   Calcium Carbonate-Vitamin D 600-400 MG-UNIT tablet Take 2 tablets by mouth daily at 12 noon.   EPINEPHrine 0.3 mg/0.3 mL Soaj injection Commonly known as: EPI-PEN auto-injector   levothyroxine 75 MCG tablet Commonly known as: SYNTHROID TAKE 1 TABLET (75 MCG TOTAL)  BY MOUTH DAILY BEFORE BREAKFAST.   loratadine 10 MG tablet Commonly known as: CLARITIN Take 10 mg by mouth daily.   methocarbamol 750 MG tablet Commonly known as: ROBAXIN Take 375 mg by mouth every 6 (six) hours as needed for muscle spasms.   mycophenolate 500 MG tablet Commonly known as: CELLCEPT Take 1,000 mg by mouth 2 (two) times daily. Patient takes 1,000 mg in the morning and 500 mg at night.   pantoprazole 40 MG tablet Commonly known as: PROTONIX Take 1 tablet (40 mg total) by mouth daily before breakfast. What changed:   when to take this  reasons to take this   triamcinolone 55 MCG/ACT Aero nasal inhaler Commonly known as: NASACORT 1-2 sprays ea nostril   Vitamin D 50 MCG (2000 UT) Caps Take 2,000 Units by mouth daily.       History (reviewed): Past Medical History:  Diagnosis Date  . Allergy   . Arthritis    back painherniated disc lumbar  . Asthma   . Autoimmune hepatitis ([place])   . Back pain   . Cancer ([place]) 07/2017   right breast cancer,  lumpectomy and 20 sessions of XRT  . Complication of anesthesia   . GERD (gastroesophageal reflux disease)   . Hiatal hernia   . Hyperlipidemia   . Hypothyroidism   . Osteopenia   . PONV (postoperative nausea and vomiting)    asks for scop patch  . Thyroid disease   . Vasculopathy    lymphatic   Past Surgical History:  Procedure Laterality Date  . BREAST LUMPECTOMY WITH RADIOACTIVE SEED AND SENTINEL LYMPH NODE BIOPSY Right 08/22/2017   Procedure: BREAST LUMPECTOMY WITH RADIOACTIVE SEED AND SENTINEL LYMPH NODE BIOPSY;  Surgeon: Erroll Luna, MD;  Location: [facility];  Service: General;  Laterality: Right;  . COLONOSCOPY  2015   Dr. Earlean Shawl: Hemorrhoids, diverticulosis.  Next colonoscopy in 5 years due to previous history of adenomatous colon polyps  . COLONOSCOPY N/A 11/18/2019   Procedure: COLONOSCOPY;  Surgeon: Daneil Dolin, MD;  [facility];  Service: Endoscopy;  Laterality: N/A;  9:30  . CYSTOSCOPY    . cystscopy    . ESOPHAGOGASTRODUODENOSCOPY N/A 01/28/2015   Dr.Rourk- 3 small prepyloric/gastric ulcers- likely culprits causing bleeding.  hiatal hernia, small gastic polyps not manipulated.  . ESOPHAGOGASTRODUODENOSCOPY N/A 05/26/2015   Dr. Gala Romney: Small hiatal hernia, previous gastric ulcers completely healed.  Marland Kitchen KNEE SURGERY    . LIVER BIOPSY  10/09/2018  . TONSILLECTOMY    . TUBAL LIGATION     Family History  Problem Relation Age of Onset  . Stroke Mother   . Hypertension Mother   . Alzheimer's disease Mother   . Heart disease Sister   . Hypertension Sister   . Heart attack Sister        Psychologist, forensic   . Arthritis Daughter        very bad MVA   . Hypertension Son   . Seizures Son   . Colon cancer Neg Hx   . Liver cancer Neg Hx   . Pancreatic disease Neg Hx    Social History   Socioeconomic History  . Marital status: Married    Spouse name: Not on file  . Number of children: 2  . Years of education: 16  . Highest education level:  Bachelor's degree (e.g., BA, AB, BS)  Occupational History  . Occupation: retired     Comment: HR work  / Astronomer   Tobacco Use  . Smoking status: Former Smoker    Quit date: 06/06/1974    Years since quitting: 45.8  . Smokeless tobacco: Never Used  . Tobacco comment: Quit x 40 years ago  Vaping Use  . Vaping Use: Never used  Substance and Sexual Activity  . Alcohol use: No    Alcohol/week: 0.0 standard drinks  . Drug use: No  . Sexual activity: Not on file  Other Topics Concern  . Not on file  Social History Narrative  . Not on file   Social Determinants of Health   Financial Resource Strain: Not on file  Food Insecurity: Not on file  Transportation Needs: Not on file  Physical Activity: Not on file  Stress: Not on file  Social Connections: Not on file    Activities of Daily Living In your present state of health, do you have any difficulty performing the following activities: 04/05/2020  Hearing? Y  Vision? N  Difficulty concentrating or making decisions? N  Walking or climbing stairs? N  Dressing or bathing? N  Doing errands, shopping? N  Preparing Food and eating ? N  Using the Toilet? N  In the past six months, have you accidently leaked urine? N  Do you have problems with loss of bowel control? N  Managing your Medications? N  Managing your Finances? N  Housekeeping or managing your Housekeeping? N  Some recent data might be hidden  Patient reports decreased hearing in her right ear.  Patient Education/ Literacy How often do you need to have someone help you when you read instructions, pamphlets, or other written materials from your doctor or pharmacy?: 1 - Never What is the last grade level you completed in school?: Bachelor's degree  Exercise Current Exercise Habits: The patient does not participate in regular exercise at present, Exercise limited by: None identified  Diet Patient reports consuming 3 meals a day and 1 snack(s) a  day Patient reports that her primary diet is: Regular Patient reports that she does have regular access to food.   Depression Screen PHQ 2/9 Scores 03/07/2020 09/03/2019 03/05/2019 01/12/2019 09/02/2018 06/12/2018 05/01/2018  PHQ - 2 Score 0 0 0 0 0 0 0  PHQ- 9 Score - - - - - - -  Fall Risk Fall Risk  04/05/2020 03/07/2020 09/03/2019 03/05/2019 01/12/2019  Falls in the past year? 1 1 1  0 0  Number falls in past yr: 0 0 0 - -  Injury with Fall? 1 1 1  - -  Risk for fall due to : History of fall(s) Impaired balance/gait Other (Comment) - -  Follow up Falls evaluation completed Education provided - - -     Objective:  Cheryl Perry seemed alert and oriented and she participated appropriately during our telephone visit.  Blood Pressure Weight BMI  BP Readings from Last 3 Encounters:  03/07/20 114/69  02/03/20 112/80  11/18/19 103/71   Wt Readings from Last 3 Encounters:  03/07/20 173 lb 6.4 oz (78.7 kg)  02/03/20 172 lb 6.4 oz (78.2 kg)  11/18/19 170 lb (77.1 kg)   BMI Readings from Last 1 Encounters:  03/07/20 27.99 kg/m    *Unable to obtain current vital signs, weight, and BMI due to telephone visit type  Hearing/Vision  . Bayley did not seem to have difficulty with hearing/understanding during the telephone conversation . Reports that she has had a formal eye exam by an eye care professional within the past year . Reports that she has not had a formal hearing evaluation within the past year *Unable to fully assess hearing and vision during telephone visit type  Cognitive Function: 6CIT Screen 04/05/2020 06/12/2018  What Year? 0 points 0 points  What month? 0 points 0 points  What time? 0 points 0 points  Count back from 20 0 points 0 points  Months in reverse 0 points 0 points  Repeat phrase 0 points 0 points  Total Score 0 0   (Normal:0-7, Significant for Dysfunction: >8)  Normal Cognitive Function Screening: Yes   Immunization & Health Maintenance Record Immunization  History  Administered Date(s) Administered  . DTaP 10/28/2014  . Hepatitis A 04/03/2016  . Influenza, High Dose Seasonal PF 10/28/2014, 10/11/2015, 10/10/2016, 10/14/2017, 03/20/2018, 10/17/2018, 03/23/2019, 10/27/2019  . Influenza,inj,Quad PF,6+ Mos 10/18/2014  . Influenza,inj,quad, With Preservative 10/09/2018  . Influenza-Unspecified 12/09/2012, 11/09/2013  . Moderna Sars-Covid-2 Vaccination 02/20/2019, 03/21/2019, 09/25/2019  . Pneumococcal Conjugate-13 12/09/2013  . Pneumococcal Polysaccharide-23 05/04/2011, 10/22/2013, 10/28/2014, 03/20/2018, 10/27/2019  . Tdap 06/09/2003, 06/09/2013  . Typhoid Live 04/03/2016  . Zoster 04/21/2010    Health Maintenance  Topic Date Due  . COVID-19 Vaccine (4 - Booster for Moderna series) 03/24/2020  . MAMMOGRAM  07/08/2021  . DEXA SCAN  03/09/2022  . TETANUS/TDAP  06/10/2023  . COLONOSCOPY (Pts 45-7yrs Insurance coverage will need to be confirmed)  11/17/2024  . INFLUENZA VACCINE  Completed  . Hepatitis C Screening  Completed  . PNA vac Low Risk Adult  Completed  . HPV VACCINES  Aged Out       Assessment  This is a routine wellness examination for Cheryl Perry.  Health Maintenance: Due or Overdue Health Maintenance Due  Topic Date Due  . COVID-19 Vaccine (4 - Booster for Moderna series) 03/24/2020    Cheryl Perry does not need a referral for Community Assistance: Care Management:   no Social Work:    no Prescription Assistance:  no Nutrition/Diabetes Education:  no   Plan:  Personalized Goals Goals Addressed            This Visit's Progress   . Patient Stated       04/05/2020 AWV Goal: Exercise for General Health   Patient will verbalize understanding of the benefits of increased physical activity:  Exercising regularly is important. It will improve your overall fitness, flexibility, and endurance.  Regular exercise also will improve your overall health. It can help you control your weight, reduce stress, and  improve your bone density.  Over the next year, patient will increase physical activity as tolerated with a goal of at least 150 minutes of moderate physical activity per week.   You can tell that you are exercising at a moderate intensity if your heart starts beating faster and you start breathing faster but can still hold a conversation.  Moderate-intensity exercise ideas include:  Walking 1 mile (1.6 km) in about 15 minutes  Biking  Hiking  Golfing  Dancing  Water aerobics  Patient will verbalize understanding of everyday activities that increase physical activity by providing examples like the following: ? Yard work, such as: ? Pushing a Conservation officer, nature ? Raking and bagging leaves ? Washing your car ? Pushing a stroller ? Shoveling snow ? Gardening ? Washing windows or floors  Patient will be able to explain general safety guidelines for exercising:   Before you start a new exercise program, talk with your health care provider.  Do not exercise so much that you hurt yourself, feel dizzy, or get very short of breath.  Wear comfortable clothes and wear shoes with good support.  Drink plenty of water while you exercise to prevent dehydration or heat stroke.  Work out until your breathing and your heartbeat get faster.       Personalized Health Maintenance & Screening Recommendations  up to date  Lung Cancer Screening Recommended: no (Low Dose CT Chest recommended if Age 38-80 years, 30 pack-year currently smoking OR have quit w/in past 15 years) Hepatitis C Screening recommended: no HIV Screening recommended: no  Advanced Directives: Written information was not prepared per patient's request.  Referrals & Orders No orders of the defined types were placed in this encounter.   Follow-up Plan . Follow-up with Sharion Balloon, FNP as planned    I have personally reviewed and noted the following in the patient's chart:   . Medical and social history . Use of  alcohol, tobacco or illicit drugs  . Current medications and supplements . Functional ability and status . Nutritional status . Physical activity . Advanced directives . List of other physicians . Hospitalizations, surgeries, and ER visits in previous 12 months . Vitals . Screenings to include cognitive, depression, and falls . Referrals and appointments  In addition, I have reviewed and discussed with Cheryl Perry certain preventive protocols, quality metrics, and best practice recommendations. A written personalized care plan for preventive services as well as general preventive health recommendations is available and can be mailed to the patient at her request.      Felicity Coyer, LPN    09/30/3005  Patient declined after visit summary

## 2020-04-08 DIAGNOSIS — J3089 Other allergic rhinitis: Secondary | ICD-10-CM | POA: Diagnosis not present

## 2020-04-08 DIAGNOSIS — J3081 Allergic rhinitis due to animal (cat) (dog) hair and dander: Secondary | ICD-10-CM | POA: Diagnosis not present

## 2020-04-08 DIAGNOSIS — J301 Allergic rhinitis due to pollen: Secondary | ICD-10-CM | POA: Diagnosis not present

## 2020-04-12 DIAGNOSIS — J301 Allergic rhinitis due to pollen: Secondary | ICD-10-CM | POA: Diagnosis not present

## 2020-04-12 DIAGNOSIS — J3081 Allergic rhinitis due to animal (cat) (dog) hair and dander: Secondary | ICD-10-CM | POA: Diagnosis not present

## 2020-04-12 DIAGNOSIS — J3089 Other allergic rhinitis: Secondary | ICD-10-CM | POA: Diagnosis not present

## 2020-04-18 IMAGING — US US ABDOMEN LIMITED
1 series · 14 of 25 positions shown · non-contrast
Comparison: Skull 06/27/2017

CLINICAL DATA: Elevated LFTs, history of DRESS

EXAM:
ULTRASOUND ABDOMEN LIMITED RIGHT UPPER QUADRANT

[Series 1: us abdomen limited · 14 of 74 slices shown]
[im 1/74]
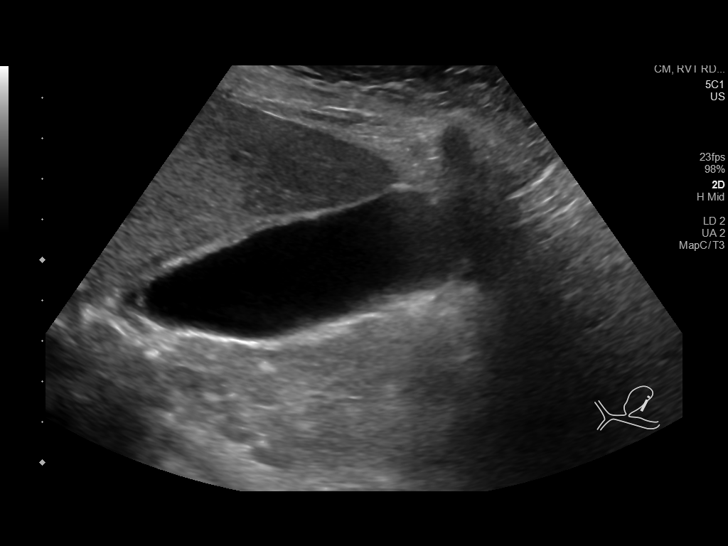
[im 7/74]
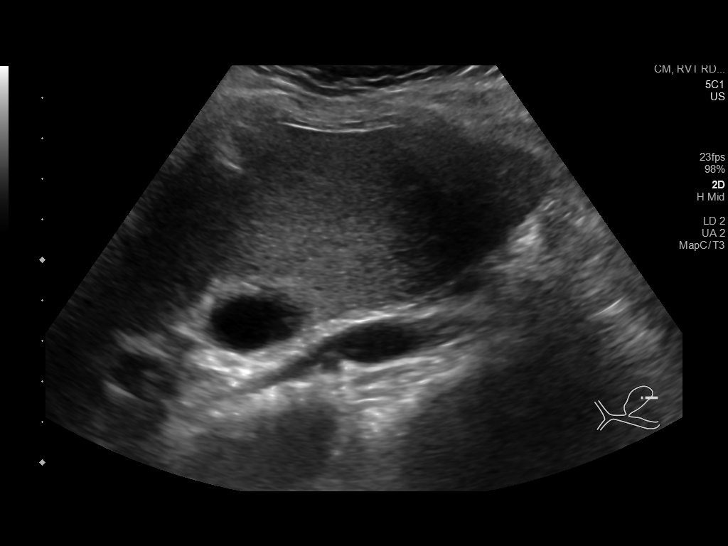
[im 13/74]
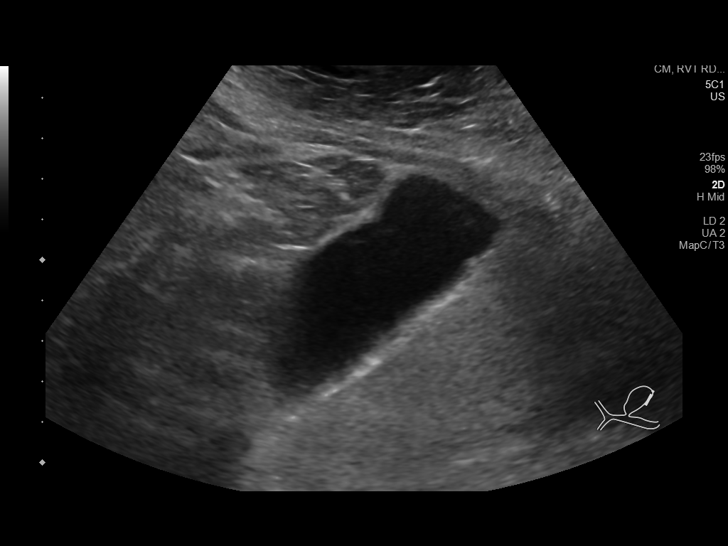
[im 19/74]
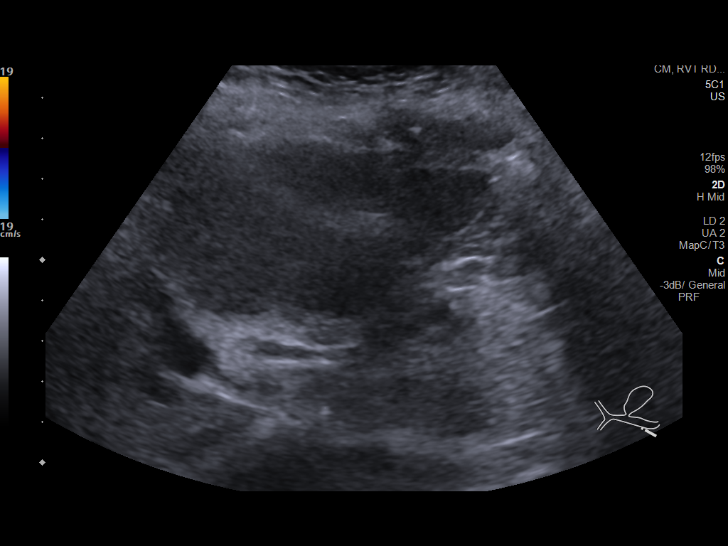
[im 25/74]
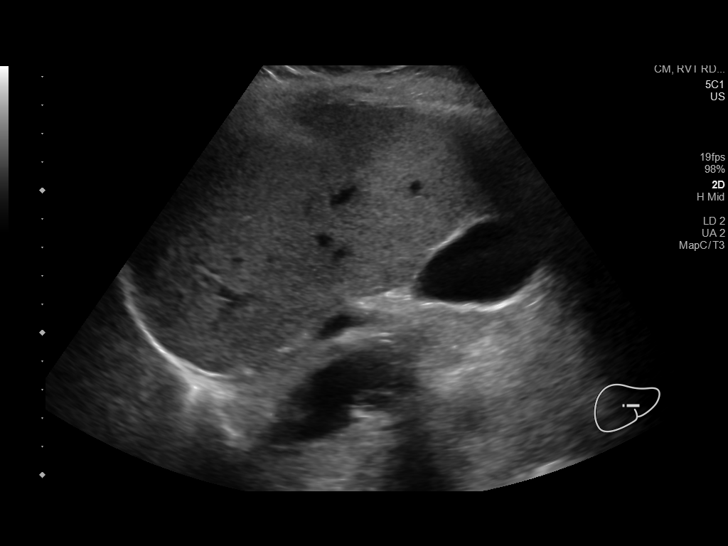
[im 28/74]
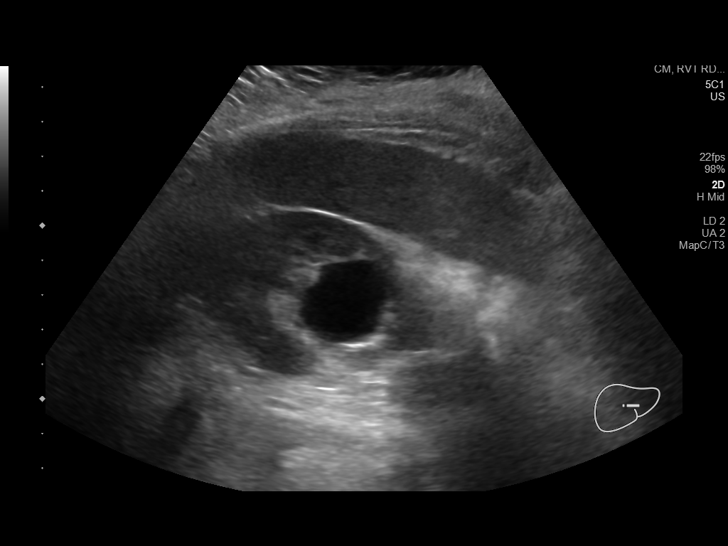
[im 34/74]
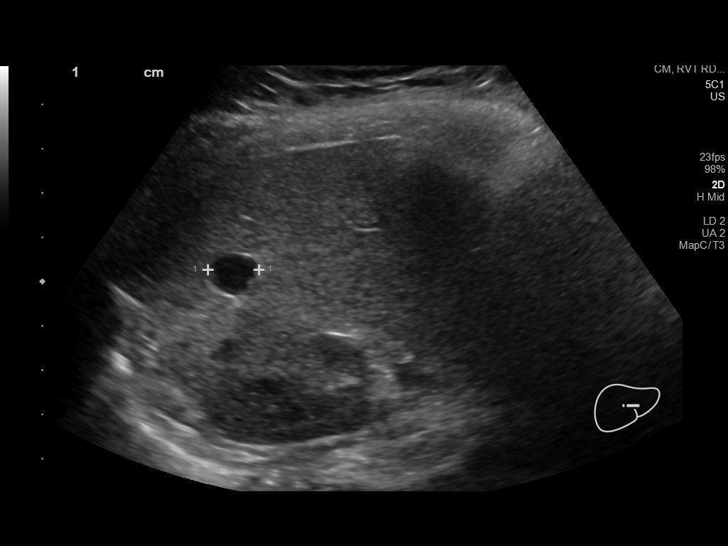
[im 40/74]
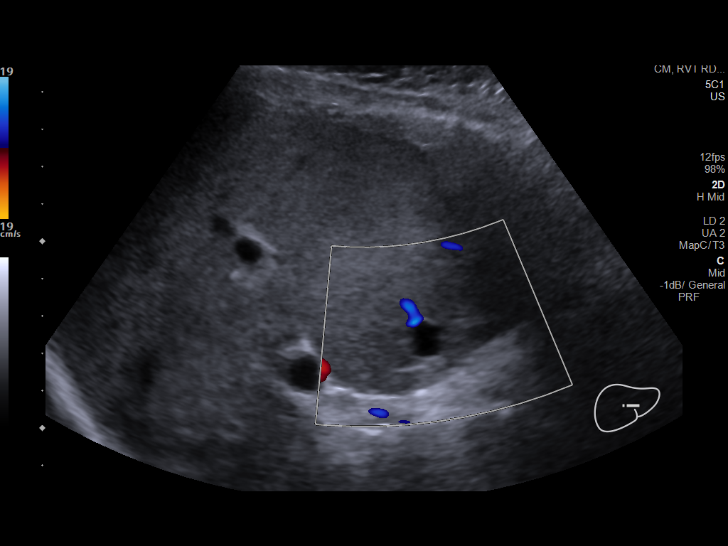
[im 46/74]
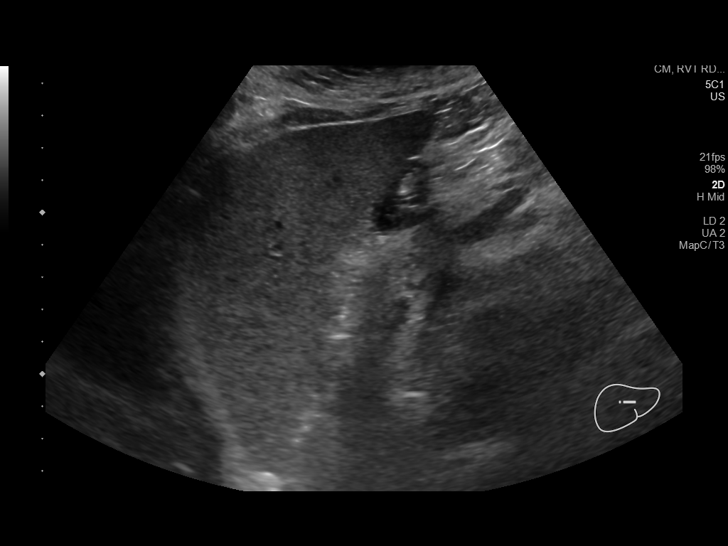
[im 49/74]
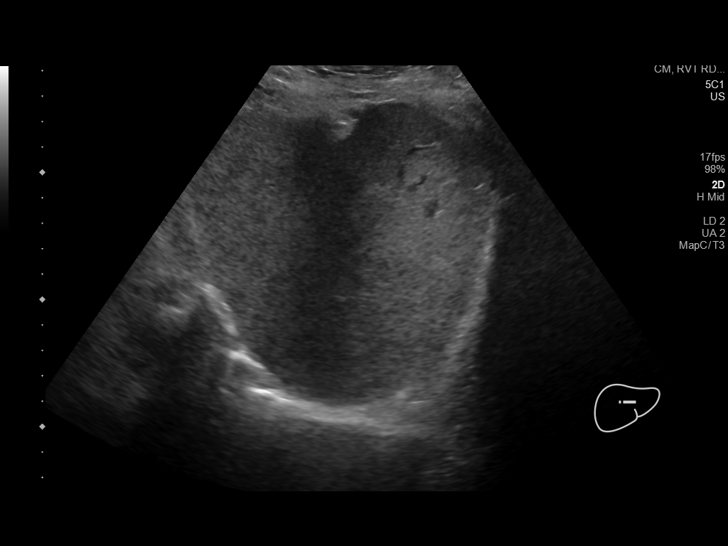
[im 55/74]
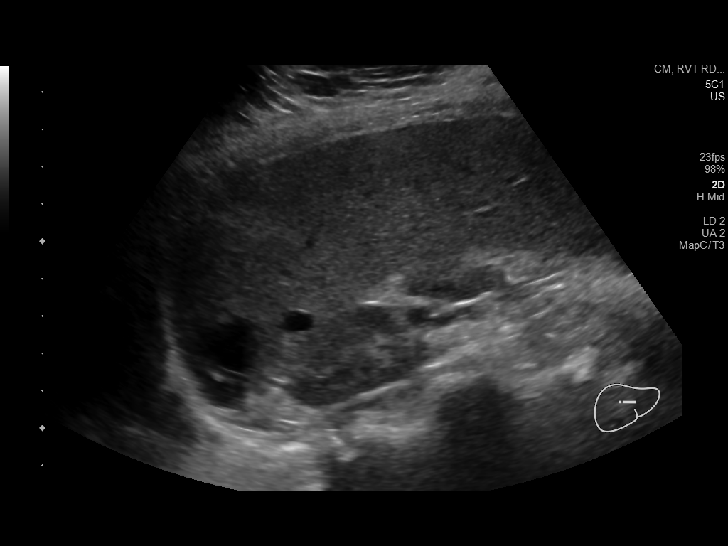
[im 61/74]
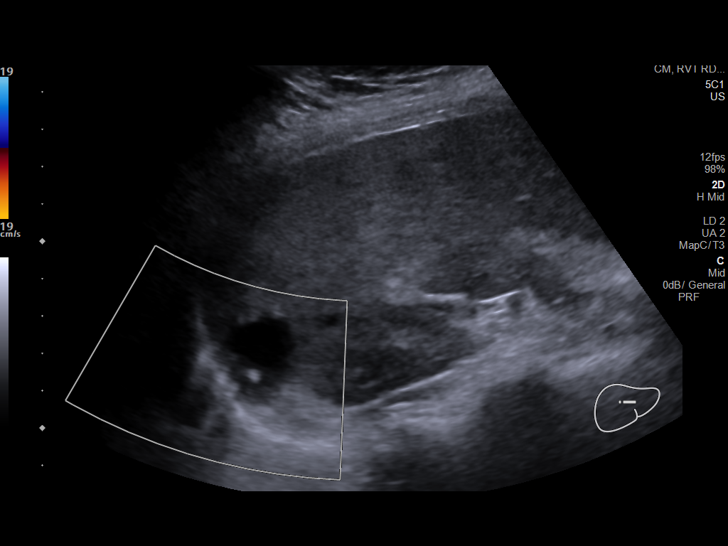
[im 67/74]
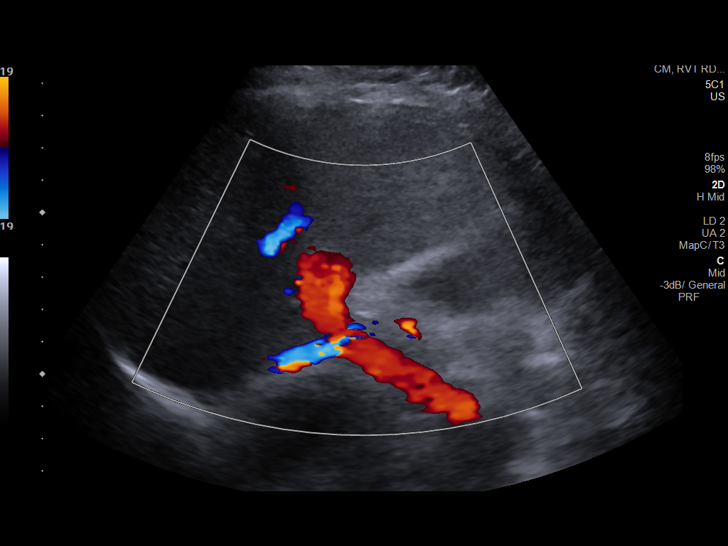
[im 74/74]
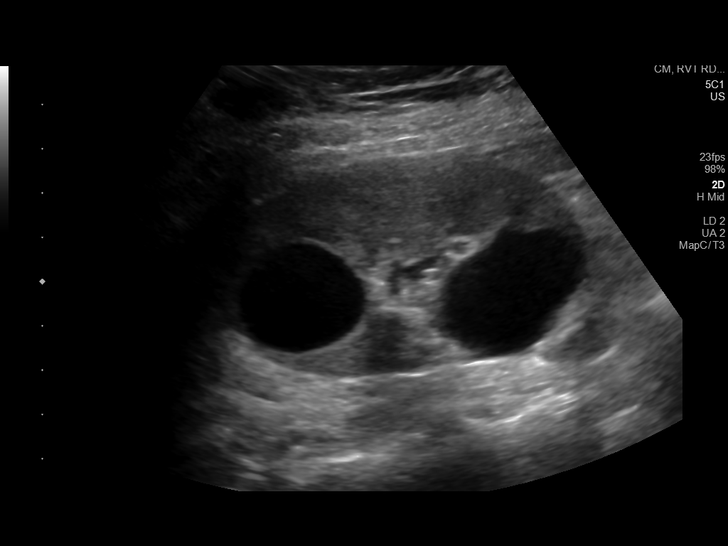

[14 of 25 positions shown; findings below may reference images not displayed]

FINDINGS: Gallbladder:

Normally distended without stones or wall thickening. No
pericholecystic fluid or sonographic Murphy sign.

Common bile duct:

Diameter: 4 mm, normal

Liver:

Echogenic parenchyma, likely fatty infiltration though this can be
seen with cirrhosis and certain infiltrative disorders. Small
hepatic cysts, 2.0 cm greatest size RIGHT lobe, 1.2 cm greatest size
RIGHT lobe, 1.1 cm RIGHT lobe. No additional hepatic masses or
nodularity. Portal vein is patent on color Doppler imaging with
normal direction of blood flow towards the liver.

Other: No RIGHT upper quadrant free fluid. Multiple RIGHT renal
cysts incidentally noted largest 3.2 cm greatest size.
IMPRESSION: Hepatic and RIGHT renal cysts.

Probable fatty infiltration of liver as above.

## 2020-04-19 DIAGNOSIS — J3081 Allergic rhinitis due to animal (cat) (dog) hair and dander: Secondary | ICD-10-CM | POA: Diagnosis not present

## 2020-04-19 DIAGNOSIS — J3089 Other allergic rhinitis: Secondary | ICD-10-CM | POA: Diagnosis not present

## 2020-04-19 DIAGNOSIS — J301 Allergic rhinitis due to pollen: Secondary | ICD-10-CM | POA: Diagnosis not present

## 2020-04-26 DIAGNOSIS — J301 Allergic rhinitis due to pollen: Secondary | ICD-10-CM | POA: Diagnosis not present

## 2020-04-26 DIAGNOSIS — J3081 Allergic rhinitis due to animal (cat) (dog) hair and dander: Secondary | ICD-10-CM | POA: Diagnosis not present

## 2020-04-26 DIAGNOSIS — J3089 Other allergic rhinitis: Secondary | ICD-10-CM | POA: Diagnosis not present

## 2020-04-29 DIAGNOSIS — Z23 Encounter for immunization: Secondary | ICD-10-CM | POA: Diagnosis not present

## 2020-05-05 DIAGNOSIS — J3081 Allergic rhinitis due to animal (cat) (dog) hair and dander: Secondary | ICD-10-CM | POA: Diagnosis not present

## 2020-05-05 DIAGNOSIS — J3089 Other allergic rhinitis: Secondary | ICD-10-CM | POA: Diagnosis not present

## 2020-05-05 DIAGNOSIS — J301 Allergic rhinitis due to pollen: Secondary | ICD-10-CM | POA: Diagnosis not present

## 2020-05-13 DIAGNOSIS — J3089 Other allergic rhinitis: Secondary | ICD-10-CM | POA: Diagnosis not present

## 2020-05-13 DIAGNOSIS — J301 Allergic rhinitis due to pollen: Secondary | ICD-10-CM | POA: Diagnosis not present

## 2020-05-13 DIAGNOSIS — J3081 Allergic rhinitis due to animal (cat) (dog) hair and dander: Secondary | ICD-10-CM | POA: Diagnosis not present

## 2020-05-17 DIAGNOSIS — M5412 Radiculopathy, cervical region: Secondary | ICD-10-CM | POA: Diagnosis not present

## 2020-05-18 DIAGNOSIS — J301 Allergic rhinitis due to pollen: Secondary | ICD-10-CM | POA: Diagnosis not present

## 2020-05-18 DIAGNOSIS — J4540 Moderate persistent asthma, uncomplicated: Secondary | ICD-10-CM | POA: Diagnosis not present

## 2020-05-18 DIAGNOSIS — J3089 Other allergic rhinitis: Secondary | ICD-10-CM | POA: Diagnosis not present

## 2020-05-18 DIAGNOSIS — J3081 Allergic rhinitis due to animal (cat) (dog) hair and dander: Secondary | ICD-10-CM | POA: Diagnosis not present

## 2020-05-27 DIAGNOSIS — J3081 Allergic rhinitis due to animal (cat) (dog) hair and dander: Secondary | ICD-10-CM | POA: Diagnosis not present

## 2020-05-27 DIAGNOSIS — J3089 Other allergic rhinitis: Secondary | ICD-10-CM | POA: Diagnosis not present

## 2020-05-27 DIAGNOSIS — J301 Allergic rhinitis due to pollen: Secondary | ICD-10-CM | POA: Diagnosis not present

## 2020-05-31 DIAGNOSIS — J3081 Allergic rhinitis due to animal (cat) (dog) hair and dander: Secondary | ICD-10-CM | POA: Diagnosis not present

## 2020-05-31 DIAGNOSIS — J3089 Other allergic rhinitis: Secondary | ICD-10-CM | POA: Diagnosis not present

## 2020-05-31 DIAGNOSIS — M5412 Radiculopathy, cervical region: Secondary | ICD-10-CM | POA: Diagnosis not present

## 2020-05-31 DIAGNOSIS — J301 Allergic rhinitis due to pollen: Secondary | ICD-10-CM | POA: Diagnosis not present

## 2020-05-31 DIAGNOSIS — M5416 Radiculopathy, lumbar region: Secondary | ICD-10-CM | POA: Diagnosis not present

## 2020-06-09 DIAGNOSIS — M5416 Radiculopathy, lumbar region: Secondary | ICD-10-CM | POA: Diagnosis not present

## 2020-06-14 DIAGNOSIS — J3081 Allergic rhinitis due to animal (cat) (dog) hair and dander: Secondary | ICD-10-CM | POA: Diagnosis not present

## 2020-06-14 DIAGNOSIS — J3089 Other allergic rhinitis: Secondary | ICD-10-CM | POA: Diagnosis not present

## 2020-06-14 DIAGNOSIS — J301 Allergic rhinitis due to pollen: Secondary | ICD-10-CM | POA: Diagnosis not present

## 2020-06-16 ENCOUNTER — Other Ambulatory Visit: Payer: Self-pay

## 2020-06-16 ENCOUNTER — Encounter: Payer: Self-pay | Admitting: Family Medicine

## 2020-06-16 ENCOUNTER — Ambulatory Visit (INDEPENDENT_AMBULATORY_CARE_PROVIDER_SITE_OTHER): Payer: Medicare Other | Admitting: Family Medicine

## 2020-06-16 ENCOUNTER — Ambulatory Visit (INDEPENDENT_AMBULATORY_CARE_PROVIDER_SITE_OTHER): Payer: Medicare Other

## 2020-06-16 VITALS — BP 117/79 | HR 67 | Temp 98.0°F | Ht 66.0 in | Wt 174.2 lb

## 2020-06-16 DIAGNOSIS — M25562 Pain in left knee: Secondary | ICD-10-CM

## 2020-06-16 MED ORDER — METHYLPREDNISOLONE ACETATE 40 MG/ML IJ SUSP
80.0000 mg | Freq: Once | INTRAMUSCULAR | Status: AC
  Administered 2020-06-16: 80 mg via INTRAMUSCULAR

## 2020-06-16 NOTE — Progress Notes (Addendum)
Acute Office Visit  Subjective:    Patient ID: Cheryl Perry, female    DOB: 15-May-1945, 75 y.o.   MRN: [id number]  Chief Complaint  Patient presents with   Knee Pain    HPI Patient is in today for left knee pain x 2 days. The pain is on the lateral side of her knee. It feels like a constant dull ache. The pain is a 6/10. It feel swollen. Walking makes the pain worse. She has been limping because of the pain. She has tried ice with short term relief. She has also tried diclofenac gel with some relief. Denies injury. She has a history of arthritis. Denies fever or warmth. She is not able to take NSAIDs due to a history of stomach ulcer. She has a history of autoimmune hepatitis as well.   Past Medical History:  Diagnosis Date   Allergy    Arthritis    back painherniated disc lumbar   Asthma    Autoimmune hepatitis ([place])    Back pain    Cancer ([place]) 07/2017   right breast cancer, lumpectomy and 20 sessions of XRT   Complication of anesthesia    GERD (gastroesophageal reflux disease)    Hiatal hernia    Hyperlipidemia    Hypothyroidism    Osteopenia    PONV (postoperative nausea and vomiting)    asks for scop patch   Thyroid disease    Vasculopathy    lymphatic    Past Surgical History:  Procedure Laterality Date   BREAST LUMPECTOMY WITH RADIOACTIVE SEED AND SENTINEL LYMPH NODE BIOPSY Right 08/22/2017   Procedure: BREAST LUMPECTOMY WITH RADIOACTIVE SEED AND SENTINEL LYMPH NODE BIOPSY;  Surgeon: Erroll Luna, MD;  Location: [place] [facility];  Service: General;  Laterality: Right;   COLONOSCOPY  2015   Dr. Earlean Shawl: Hemorrhoids, diverticulosis.  Next colonoscopy in 5 years due to previous history of adenomatous colon polyps   COLONOSCOPY N/A 11/18/2019   Procedure: COLONOSCOPY;  Surgeon: Daneil Dolin, MD;  [facility];  Service: Endoscopy;  Laterality: N/A;  9:30   CYSTOSCOPY     cystscopy     ESOPHAGOGASTRODUODENOSCOPY N/A 01/28/2015   Dr.Rourk-  3 small prepyloric/gastric ulcers- likely culprits causing bleeding. hiatal hernia, small gastic polyps not manipulated.   ESOPHAGOGASTRODUODENOSCOPY N/A 05/26/2015   Dr. Gala Romney: Small hiatal hernia, previous gastric ulcers completely healed.   KNEE SURGERY     LIVER BIOPSY  10/09/2018   TONSILLECTOMY     TUBAL LIGATION      Family History  Problem Relation Age of Onset   Stroke Mother    Hypertension Mother    Alzheimer's disease Mother    Heart disease Sister    Hypertension Sister    Heart attack Sister        pace maker    Arthritis Daughter        very bad MVA    Hypertension Son    Seizures Son    Colon cancer Neg Hx    Liver cancer Neg Hx    Pancreatic disease Neg Hx     Social History   Socioeconomic History   Marital status: Married    Spouse name: Not on file   Number of children: 2   Years of education: 16   Highest education level: Bachelor's degree (e.g., BA, AB, BS)  Occupational History   Occupation: retired     Comment: HR work  / Astronomer   Tobacco Use  Smoking status: Former    Pack years: 0.00    Types: Cigarettes    Quit date: 06/06/1974    Years since quitting: 46.0   Smokeless tobacco: Never   Tobacco comments:    Quit x 40 years ago  Vaping Use   Vaping Use: Never used  Substance and Sexual Activity   Alcohol use: No    Alcohol/week: 0.0 standard drinks   Drug use: No   Sexual activity: Not on file  Other Topics Concern   Not on file  Social History Narrative   Not on file   Social Determinants of Health   Financial Resource Strain: Not on file  Food Insecurity: Not on file  Transportation Needs: Not on file  Physical Activity: Not on file  Stress: Not on file  Social Connections: Not on file  Intimate Partner Violence: Not on file    Outpatient Medications Prior to Visit  Medication Sig Dispense Refill   albuterol (PROVENTIL HFA) 108 (90 Base) MCG/ACT inhaler Inhale 2 puffs into the lungs every 6 (six)  hours as needed for wheezing. May use only 3 times a month 18 g 3   calcium carbonate (TUMS - DOSED IN MG ELEMENTAL CALCIUM) 500 MG chewable tablet Chew 1 tablet by mouth 3 (three) times daily as needed for indigestion or heartburn.      Calcium Carbonate-Vitamin D 600-400 MG-UNIT tablet Take 2 tablets by mouth daily at 12 noon.      Cholecalciferol (VITAMIN D) 2000 UNITS CAPS Take 2,000 Units by mouth daily.      EPINEPHrine 0.3 mg/0.3 mL IJ SOAJ injection auto-injector     fluticasone-salmeterol (ADVAIR HFA) 115-21 MCG/ACT inhaler 2 puffs     levothyroxine (SYNTHROID) 75 MCG tablet TAKE 1 TABLET (75 MCG TOTAL)  BY MOUTH DAILY BEFORE BREAKFAST. 90 tablet 3   loratadine (CLARITIN) 10 MG tablet Take 10 mg by mouth daily.     methocarbamol (ROBAXIN) 750 MG tablet Take 375 mg by mouth every 6 (six) hours as needed for muscle spasms.   2   mycophenolate (CELLCEPT) 500 MG tablet Take 1,000 mg by mouth 2 (two) times daily. Patient takes 1,000 mg in the morning and 500 mg at night.     pantoprazole (PROTONIX) 40 MG tablet Take 1 tablet (40 mg total) by mouth daily before breakfast. (Patient taking differently: Take 40 mg by mouth as needed.) 90 tablet 3   triamcinolone (NASACORT) 55 MCG/ACT AERO nasal inhaler 1-2 sprays ea nostril     No facility-administered medications prior to visit.    Allergies  Allergen Reactions   Azathioprine Other (See Comments)    Cholestatic liver injury    Statins Other (See Comments)    Elevated LFT's   Sulfa Antibiotics Hives   Naproxen Hives and Itching    Note - also has Ulcer - cant take    Lipitor [Atorvastatin]     mylagias   Premarin [Conjugated Estrogens] Itching    tightness in chest   Ampicillin Rash   Avelox [Moxifloxacin Hcl In Nacl] Rash   Azithromycin Rash    Concern for DRESS after useage   Bactrim [Sulfamethoxazole-Trimethoprim] Hives   Crestor [Rosuvastatin Calcium]     Elevated liver results?   Doxycycline Rash    Concern for DRESS after  usage.   Levaquin [Levofloxacin In D5w] Rash   Livalo [Pitavastatin] Other (See Comments)    myalgias   Macrodantin [Nitrofurantoin Macrocrystal] Hives and Rash   Trental [Pentoxifylline Er] Rash  Review of Systems As per HPI.     Objective:    Physical Exam Vitals and nursing note reviewed.  Constitutional:      General: She is not in acute distress.    Appearance: She is not ill-appearing, toxic-appearing or diaphoretic.  Pulmonary:     Effort: Pulmonary effort is normal. No respiratory distress.  Musculoskeletal:     Left knee: Swelling (mild upper lateral soft tissue swelling) present. No deformity, effusion, erythema or crepitus. Tenderness present over the lateral joint line. Normal alignment.     Right lower leg: No tenderness. No edema.     Left lower leg: No tenderness. No edema.  Skin:    General: Skin is dry.  Neurological:     General: No focal deficit present.     Mental Status: She is alert and oriented to person, place, and time.  Psychiatric:        Mood and Affect: Mood normal.        Behavior: Behavior normal.    BP 117/79   Pulse 67   Temp 98 F (36.7 C) (Oral)   Ht _0  (1.676 m)   Wt 174 lb 4 oz (79 kg)   BMI 28.12 kg/m  Wt Readings from Last 3 Encounters:  06/16/20 174 lb 4 oz (79 kg)  03/07/20 173 lb 6.4 oz (78.7 kg)  02/03/20 172 lb 6.4 oz (78.2 kg)    Health Maintenance Due  Topic Date Due   Pneumococcal Vaccine 79-3 Years old (1 - PCV) Never done   Zoster Vaccines- Shingrix (1 of 2) Never done   COVID-19 Vaccine (4 - Booster for Moderna series) 12/25/2019    There are no preventive care reminders to display for this patient.   Lab Results  Component Value Date   TSH 3.230 03/07/2020   Lab Results  Component Value Date   WBC 5.3 09/03/2019   HGB 14.3 09/03/2019   HCT 42.6 09/03/2019   MCV 93 09/03/2019   PLT 198 09/03/2019   Lab Results  Component Value Date   NA 142 03/07/2020   K 4.2 03/07/2020   CO2 21  03/07/2020   GLUCOSE 95 03/07/2020   BUN 12 03/07/2020   CREATININE 0.75 03/07/2020   BILITOT 0.4 03/07/2020   ALKPHOS 86 03/07/2020   AST 21 03/07/2020   ALT 14 03/07/2020   PROT 6.1 03/07/2020   ALBUMIN 4.3 03/07/2020   CALCIUM 9.3 03/07/2020   ANIONGAP 8 12/28/2018   EGFR 83 03/07/2020   Lab Results  Component Value Date   CHOL 211 (H) 12/17/2017   Lab Results  Component Value Date   HDL 71 12/17/2017   Lab Results  Component Value Date   LDLCALC 121 (H) 12/17/2017   Lab Results  Component Value Date   TRIG 93 12/17/2017   Lab Results  Component Value Date   CHOLHDL 3.0 12/17/2017   Lab Results  Component Value Date   HGBA1C 5.5 06/19/2017       Assessment & Plan:   Idolina was seen today for knee pain.  Diagnoses and all orders for this visit:  Acute pain of left knee With lateral joint line tenderness and mild soft tissue swelling. History of arthritis. Xray today in office, radiology report pending. Steroid IM injection today in office. Continue diclofenac gel, ice, heat, rest as needed. Return to office for new or worsening symptoms, or if symptoms persist.  -     DG Knee 1-2 Views Left; Future       -  methylPREDNISolone acetate (DEPO-MEDROL)      injection 80 mg    The patient indicates understanding of these issues and agrees with the plan.  Gwenlyn Perking, FNP

## 2020-06-16 NOTE — Addendum Note (Signed)
Addended by: Milas Hock on: 06/16/2020 12:07 PM   Modules accepted: Orders

## 2020-06-21 DIAGNOSIS — M25562 Pain in left knee: Secondary | ICD-10-CM | POA: Diagnosis not present

## 2020-07-13 DIAGNOSIS — M25562 Pain in left knee: Secondary | ICD-10-CM | POA: Diagnosis not present

## 2020-07-15 DIAGNOSIS — Z1231 Encounter for screening mammogram for malignant neoplasm of breast: Secondary | ICD-10-CM | POA: Diagnosis not present

## 2020-07-18 DIAGNOSIS — D849 Immunodeficiency, unspecified: Secondary | ICD-10-CM | POA: Diagnosis not present

## 2020-07-18 DIAGNOSIS — K754 Autoimmune hepatitis: Secondary | ICD-10-CM | POA: Diagnosis not present

## 2020-08-12 ENCOUNTER — Other Ambulatory Visit: Payer: Self-pay | Admitting: Orthopedic Surgery

## 2020-08-12 DIAGNOSIS — M25562 Pain in left knee: Secondary | ICD-10-CM

## 2020-08-31 ENCOUNTER — Other Ambulatory Visit: Payer: Self-pay

## 2020-08-31 ENCOUNTER — Ambulatory Visit
Admission: RE | Admit: 2020-08-31 | Discharge: 2020-08-31 | Disposition: A | Discharge status: Home / Self Care | Payer: Medicare Other | Source: Ambulatory Visit | Attending: Orthopedic Surgery | Admitting: Orthopedic Surgery

## 2020-08-31 DIAGNOSIS — M25562 Pain in left knee: Secondary | ICD-10-CM | POA: Diagnosis not present

## 2020-08-31 DIAGNOSIS — M25462 Effusion, left knee: Secondary | ICD-10-CM | POA: Diagnosis not present

## 2020-09-02 DIAGNOSIS — J3089 Other allergic rhinitis: Secondary | ICD-10-CM | POA: Diagnosis not present

## 2020-09-02 DIAGNOSIS — J301 Allergic rhinitis due to pollen: Secondary | ICD-10-CM | POA: Diagnosis not present

## 2020-09-02 DIAGNOSIS — J3081 Allergic rhinitis due to animal (cat) (dog) hair and dander: Secondary | ICD-10-CM | POA: Diagnosis not present

## 2020-09-05 ENCOUNTER — Encounter: Payer: Self-pay | Admitting: Family

## 2020-09-05 ENCOUNTER — Other Ambulatory Visit: Payer: Self-pay

## 2020-09-05 ENCOUNTER — Ambulatory Visit (INDEPENDENT_AMBULATORY_CARE_PROVIDER_SITE_OTHER): Payer: Medicare Other | Admitting: Family

## 2020-09-05 VITALS — BP 123/82 | HR 74 | Temp 97.5°F | Ht 66.0 in | Wt 181.6 lb

## 2020-09-05 DIAGNOSIS — E559 Vitamin D deficiency, unspecified: Secondary | ICD-10-CM

## 2020-09-05 DIAGNOSIS — E785 Hyperlipidemia, unspecified: Secondary | ICD-10-CM

## 2020-09-05 DIAGNOSIS — E663 Overweight: Secondary | ICD-10-CM

## 2020-09-05 DIAGNOSIS — E038 Other specified hypothyroidism: Secondary | ICD-10-CM | POA: Diagnosis not present

## 2020-09-05 DIAGNOSIS — J45909 Unspecified asthma, uncomplicated: Secondary | ICD-10-CM

## 2020-09-05 DIAGNOSIS — K754 Autoimmune hepatitis: Secondary | ICD-10-CM | POA: Diagnosis not present

## 2020-09-05 DIAGNOSIS — K219 Gastro-esophageal reflux disease without esophagitis: Secondary | ICD-10-CM

## 2020-09-05 DIAGNOSIS — Z23 Encounter for immunization: Secondary | ICD-10-CM | POA: Diagnosis not present

## 2020-09-05 NOTE — Progress Notes (Signed)
Subjective:    Patient ID: Cheryl Perry, female    DOB: March 30, 1945, 75 y.o.   MRN: [id number]  Chief Complaint  Patient presents with   Medical Management of Chronic Issues   Pt presents to the office today for chronic follow up.  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 She is currently taking Cellcept  and doing well.    Pt is followed by allergen specialists annually for her asthma. PT is followed by Ortho as needed for chronic back pain and left knee pain.    She is followed by Oncologists every 6 months for right breast cancer. She completed radiation in 10/29/17.  Asthma She complains of cough and hoarse voice. There is no shortness of breath or wheezing. This is a chronic problem. The current episode started more than 1 year ago. The problem has been unchanged. Associated symptoms include heartburn. Her past medical history is significant for asthma.  Gastroesophageal Reflux She complains of belching, coughing, heartburn and a hoarse voice. She reports no wheezing. This is a chronic problem. The current episode started more than 1 year ago. The problem occurs occasionally. Risk factors include obesity. She has tried a histamine-2 antagonist for the symptoms. The treatment provided moderate relief.  Thyroid Problem Presents for follow-up visit. Symptoms include dry skin and hoarse voice. Patient reports no depressed mood, diarrhea or hair loss. The symptoms have been stable. Her past medical history is significant for hyperlipidemia.  Hyperlipidemia This is a chronic problem. The current episode started more than 1 year ago. Exacerbating diseases include obesity. Pertinent negatives include no shortness of breath. Current antihyperlipidemic treatment includes diet change. The current treatment provides mild improvement of lipids. Risk factors for coronary artery disease include dyslipidemia,  hypertension, a sedentary lifestyle and post-menopausal.  Arthritis Presents for follow-up visit. She complains of pain and stiffness. The symptoms have been worsening. Affected location: back. Her pain is at a severity of 3/10. Pertinent negatives include no diarrhea.     Review of Systems  HENT:  Positive for hoarse voice.   Respiratory:  Positive for cough. Negative for shortness of breath and wheezing.   Gastrointestinal:  Positive for heartburn. Negative for diarrhea.  Musculoskeletal:  Positive for arthritis and stiffness.  All other systems reviewed and are negative.     Objective:   Physical Exam Vitals reviewed.  Constitutional:      General: She is not in acute distress.    Appearance: She is well-developed. She is obese.  HENT:     Head: Normocephalic and atraumatic.     Right Ear: Tympanic membrane normal.     Left Ear: Tympanic membrane normal.  Eyes:     Pupils: Pupils are equal, round, and reactive to light.  Neck:     Thyroid: No thyromegaly.  Cardiovascular:     Rate and Rhythm: Normal rate and regular rhythm.     Heart sounds: Normal heart sounds. No murmur heard. Pulmonary:     Effort: Pulmonary effort is normal. No respiratory distress.     Breath sounds: Normal breath sounds. No wheezing.  Abdominal:     General: Bowel sounds are normal. There is no distension.     Palpations: Abdomen is soft.     Tenderness: There is no abdominal tenderness.  Musculoskeletal:        General: No tenderness. Normal range of motion.     Cervical  back: Normal range of motion and neck supple.  Skin:    General: Skin is warm and dry.  Neurological:     Mental Status: She is alert and oriented to person, place, and time.     Cranial Nerves: No cranial nerve deficit.     Deep Tendon Reflexes: Reflexes are normal and symmetric.  Psychiatric:        Behavior: Behavior normal.        Thought Content: Thought content normal.        Judgment: Judgment normal.          BP 123/82   Pulse 74   Temp (!) 97.5 F (36.4 C) (Temporal)   Ht 5' 6"  (1.676 m)   Wt 181 lb 9.6 oz (82.4 kg)   SpO2 95%   BMI 29.31 kg/m   Assessment & Plan:  Cheryl Perry comes in today with chief complaint of Medical Management of Chronic Issues   Diagnosis and orders addressed:  1. Uncomplicated asthma, unspecified asthma severity, unspecified whether persistent - CMP14+EGFR  2. Autoimmune hepatitis ([place]) - CMP14+EGFR  3. Gastroesophageal reflux disease without esophagitis - CMP14+EGFR  4. Other specified hypothyroidism - CMP14+EGFR - TSH  5. Hyperlipidemia, unspecified hyperlipidemia type - CMP14+EGFR  6. Vitamin D deficiency - CMP14+EGFR  7. Overweight (BMI 25.0-29.9) - CMP14+EGFR   Labs pending Health Maintenance reviewed Diet and exercise encouraged  Follow up plan: 6 months    [name], FNP

## 2020-09-05 NOTE — Patient Instructions (Signed)
Health Maintenance After Age 75 After age 75, you are at a higher risk for certain long-term diseases and infections as well as injuries from falls. Falls are a major cause of broken bones and head injuries in people who are older than age 75. Getting regular preventive care can help to keep you healthy and well. Preventive care includes getting regular testing and making lifestyle changes as recommended by your health care provider. Talk with your health care provider about: Which screenings and tests you should have. A screening is a test that checks for a disease when you have no symptoms. A diet and exercise plan that is right for you. What should I know about screenings and tests to prevent falls? Screening and testing are the best ways to find a health problem early. Early diagnosis and treatment give you the best chance of managing medical conditions that are common after age 75. Certain conditions and lifestyle choices may make you more likely to have a fall. Your health care provider may recommend: Regular vision checks. Poor vision and conditions such as cataracts can make you more likely to have a fall. If you wear glasses, make sure to get your prescription updated if your vision changes. Medicine review. Work with your health care provider to regularly review all of the medicines you are taking, including over-the-counter medicines. Ask your health care provider about any side effects that may make you more likely to have a fall. Tell your health care provider if any medicines that you take make you feel dizzy or sleepy. Osteoporosis screening. Osteoporosis is a condition that causes the bones to get weaker. This can make the bones weak and cause them to break more easily. Blood pressure screening. Blood pressure changes and medicines to control blood pressure can make you feel dizzy. Strength and balance checks. Your health care provider may recommend certain tests to check your strength and  balance while standing, walking, or changing positions. Foot health exam. Foot pain and numbness, as well as not wearing proper footwear, can make you more likely to have a fall. Depression screening. You may be more likely to have a fall if you have a fear of falling, feel emotionally low, or feel unable to do activities that you used to do. Alcohol use screening. Using too much alcohol can affect your balance and may make you more likely to have a fall. What actions can I take to lower my risk of falls? General instructions Talk with your health care provider about your risks for falling. Tell your health care provider if: You fall. Be sure to tell your health care provider about all falls, even ones that seem minor. You feel dizzy, sleepy, or off-balance. Take over-the-counter and prescription medicines only as told by your health care provider. These include any supplements. Eat a healthy diet and maintain a healthy weight. A healthy diet includes low-fat dairy products, low-fat (lean) meats, and fiber from whole grains, beans, and lots of fruits and vegetables. Home safety Remove any tripping hazards, such as rugs, cords, and clutter. Install safety equipment such as grab bars in bathrooms and safety rails on stairs. Keep rooms and walkways well-lit. Activity  Follow a regular exercise program to stay fit. This will help you maintain your balance. Ask your health care provider what types of exercise are appropriate for you. If you need a cane or walker, use it as recommended by your health care provider. Wear supportive shoes that have nonskid soles. Lifestyle Do not   drink alcohol if your health care provider tells you not to drink. If you drink alcohol, limit how much you have: 0-1 drink a day for women. 0-2 drinks a day for men. Be aware of how much alcohol is in your drink. In the [place] one drink equals one typical bottle of beer (12 oz), one-half glass of wine (5 oz), or one shot of  hard liquor (1 oz). Do not use any products that contain nicotine or tobacco, such as cigarettes and e-cigarettes. If you need help quitting, ask your health care provider. Summary Having a healthy lifestyle and getting preventive care can help to protect your health and wellness after age 75. Screening and testing are the best way to find a health problem early and help you avoid having a fall. Early diagnosis and treatment give you the best chance for managing medical conditions that are more common for people who are older than age 75. Falls are a major cause of broken bones and head injuries in people who are older than age 75. Take precautions to prevent a fall at home. Work with your health care provider to learn what changes you can make to improve your health and wellness and to prevent falls. This information is not intended to replace advice given to you by your health care provider. Make sure you discuss any questions you have with your health care provider. Document Revised: 03/04/2020 Document Reviewed: 12/11/2019 Elsevier Patient Education  2022 [employer]  

## 2020-09-06 LAB — CMP14+EGFR
ALT: 16 IU/L (ref 0–32)
AST: 16 IU/L (ref 0–40)
Albumin/Globulin Ratio: 2.6 — ABNORMAL HIGH (ref 1.2–2.2)
Albumin: 4.4 g/dL (ref 3.7–4.7)
Alkaline Phosphatase: 68 IU/L (ref 44–121)
BUN/Creatinine Ratio: 9 — ABNORMAL LOW (ref 12–28)
BUN: 7 mg/dL — ABNORMAL LOW (ref 8–27)
Bilirubin Total: 0.4 mg/dL (ref 0.0–1.2)
CO2: 22 mmol/L (ref 20–29)
Calcium: 9.5 mg/dL (ref 8.7–10.3)
Chloride: 106 mmol/L (ref 96–106)
Creatinine, Ser: 0.82 mg/dL (ref 0.57–1.00)
Globulin, Total: 1.7 g/dL (ref 1.5–4.5)
Glucose: 97 mg/dL (ref 65–99)
Potassium: 4.4 mmol/L (ref 3.5–5.2)
Sodium: 143 mmol/L (ref 134–144)
Total Protein: 6.1 g/dL (ref 6.0–8.5)
eGFR: 75 mL/min/{1.73_m2} (ref >59)

## 2020-09-06 LAB — TSH: TSH: 3.19 u[IU]/mL (ref 0.450–4.500)

## 2020-09-09 DIAGNOSIS — J301 Allergic rhinitis due to pollen: Secondary | ICD-10-CM | POA: Diagnosis not present

## 2020-09-09 DIAGNOSIS — J3089 Other allergic rhinitis: Secondary | ICD-10-CM | POA: Diagnosis not present

## 2020-09-09 DIAGNOSIS — J3081 Allergic rhinitis due to animal (cat) (dog) hair and dander: Secondary | ICD-10-CM | POA: Diagnosis not present

## 2020-09-14 DIAGNOSIS — J3081 Allergic rhinitis due to animal (cat) (dog) hair and dander: Secondary | ICD-10-CM | POA: Diagnosis not present

## 2020-09-14 DIAGNOSIS — J3089 Other allergic rhinitis: Secondary | ICD-10-CM | POA: Diagnosis not present

## 2020-09-14 DIAGNOSIS — J301 Allergic rhinitis due to pollen: Secondary | ICD-10-CM | POA: Diagnosis not present

## 2020-09-21 DIAGNOSIS — J3089 Other allergic rhinitis: Secondary | ICD-10-CM | POA: Diagnosis not present

## 2020-09-21 DIAGNOSIS — M25562 Pain in left knee: Secondary | ICD-10-CM | POA: Diagnosis not present

## 2020-09-21 DIAGNOSIS — J301 Allergic rhinitis due to pollen: Secondary | ICD-10-CM | POA: Diagnosis not present

## 2020-09-21 DIAGNOSIS — J3081 Allergic rhinitis due to animal (cat) (dog) hair and dander: Secondary | ICD-10-CM | POA: Diagnosis not present

## 2020-09-29 DIAGNOSIS — Z23 Encounter for immunization: Secondary | ICD-10-CM | POA: Diagnosis not present

## 2020-10-06 DIAGNOSIS — J3081 Allergic rhinitis due to animal (cat) (dog) hair and dander: Secondary | ICD-10-CM | POA: Diagnosis not present

## 2020-10-06 DIAGNOSIS — J301 Allergic rhinitis due to pollen: Secondary | ICD-10-CM | POA: Diagnosis not present

## 2020-10-06 DIAGNOSIS — J3089 Other allergic rhinitis: Secondary | ICD-10-CM | POA: Diagnosis not present

## 2020-10-10 DIAGNOSIS — Z23 Encounter for immunization: Secondary | ICD-10-CM | POA: Diagnosis not present

## 2020-10-14 DIAGNOSIS — J3089 Other allergic rhinitis: Secondary | ICD-10-CM | POA: Diagnosis not present

## 2020-10-14 DIAGNOSIS — J301 Allergic rhinitis due to pollen: Secondary | ICD-10-CM | POA: Diagnosis not present

## 2020-10-14 DIAGNOSIS — J3081 Allergic rhinitis due to animal (cat) (dog) hair and dander: Secondary | ICD-10-CM | POA: Diagnosis not present

## 2020-11-10 DIAGNOSIS — M5416 Radiculopathy, lumbar region: Secondary | ICD-10-CM | POA: Diagnosis not present

## 2020-12-05 ENCOUNTER — Telehealth: Payer: Self-pay | Admitting: Family

## 2020-12-05 DIAGNOSIS — K754 Autoimmune hepatitis: Secondary | ICD-10-CM

## 2020-12-05 NOTE — Telephone Encounter (Signed)
Patient states her back is tender she wants her liver enzymes checked. And if it is her liver she will call her specialist instead of coming here. Can we order please advise?

## 2020-12-05 NOTE — Telephone Encounter (Signed)
Patient aware.

## 2020-12-05 NOTE — Telephone Encounter (Signed)
Labs ordered.

## 2020-12-06 ENCOUNTER — Other Ambulatory Visit: Payer: Self-pay

## 2020-12-06 ENCOUNTER — Other Ambulatory Visit: Payer: Medicare Other

## 2020-12-06 DIAGNOSIS — K754 Autoimmune hepatitis: Secondary | ICD-10-CM | POA: Diagnosis not present

## 2020-12-07 LAB — CBC WITH DIFFERENTIAL/PLATELET
Basophils Absolute: 0.1 10*3/uL (ref 0.0–0.2)
Basos: 1 %
EOS (ABSOLUTE): 0.1 10*3/uL (ref 0.0–0.4)
Eos: 2 %
Hematocrit: 43.3 % (ref 34.0–46.6)
Hemoglobin: 14.8 g/dL (ref 11.1–15.9)
Immature Grans (Abs): 0 10*3/uL (ref 0.0–0.1)
Immature Granulocytes: 0 %
Lymphocytes Absolute: 1.2 10*3/uL (ref 0.7–3.1)
Lymphs: 19 %
MCH: 32.5 pg (ref 26.6–33.0)
MCHC: 34.2 g/dL (ref 31.5–35.7)
MCV: 95 fL (ref 79–97)
Monocytes Absolute: 0.7 10*3/uL (ref 0.1–0.9)
Monocytes: 11 %
Neutrophils Absolute: 4.1 10*3/uL (ref 1.4–7.0)
Neutrophils: 67 %
Platelets: 248 10*3/uL (ref 150–450)
RBC: 4.56 x10E6/uL (ref 3.77–5.28)
RDW: 11.5 % — ABNORMAL LOW (ref 11.7–15.4)
WBC: 6 10*3/uL (ref 3.4–10.8)

## 2020-12-07 LAB — BMP8+EGFR
BUN/Creatinine Ratio: 9 — ABNORMAL LOW (ref 12–28)
BUN: 8 mg/dL (ref 8–27)
CO2: 20 mmol/L (ref 20–29)
Calcium: 9.3 mg/dL (ref 8.7–10.3)
Chloride: 105 mmol/L (ref 96–106)
Creatinine, Ser: 0.9 mg/dL (ref 0.57–1.00)
Glucose: 98 mg/dL (ref 70–99)
Potassium: 4.3 mmol/L (ref 3.5–5.2)
Sodium: 140 mmol/L (ref 134–144)
eGFR: 67 mL/min/{1.73_m2} (ref >59)

## 2020-12-07 LAB — HEPATIC FUNCTION PANEL
ALT: 14 IU/L (ref 0–32)
AST: 21 IU/L (ref 0–40)
Albumin: 4.4 g/dL (ref 3.7–4.7)
Alkaline Phosphatase: 82 IU/L (ref 44–121)
Bilirubin Total: 0.3 mg/dL (ref 0.0–1.2)
Bilirubin, Direct: <0.1 mg/dL (ref 0.00–0.40)
Total Protein: 6.4 g/dL (ref 6.0–8.5)

## 2021-01-12 DIAGNOSIS — K754 Autoimmune hepatitis: Secondary | ICD-10-CM | POA: Diagnosis not present

## 2021-01-18 NOTE — Progress Notes (Signed)
Office Visit Note  Patient: Cheryl Perry             Date of Birth: 1945/09/06           MRN: [id number]             PCP: Sharion Balloon, FNP Referring: Sharion Balloon, FNP Visit Date: 02/01/2021 Occupation: @GUAROCC @  Subjective:  Joint stiffness  History of Present Illness: Cheryl Perry is a 76 y.o. female with a history of autoimmune hepatitis, positive rheumatoid factor and joint stiffness.  She denies any history of joint swelling.  She states she has some morning stiffness.  She has off-and-on discomfort in her hands, cervical and lumbar region.  There is no history of oral ulcers, nasal ulcers, malar rash, photosensitivity, Raynaud's phenomenon, lymphadenopathy.  Activities of Daily Living:  Patient reports morning stiffness for 5 minutes.   Patient Denies nocturnal pain.  Difficulty dressing/grooming: Denies Difficulty climbing stairs: Reports Difficulty getting out of chair: Denies Difficulty using hands for taps, buttons, cutlery, and/or writing: Denies  Review of Systems  Constitutional:  Negative for fatigue, night sweats, weight gain and weight loss.  HENT:  Negative for mouth sores, trouble swallowing, trouble swallowing, mouth dryness and nose dryness.   Eyes:  Negative for pain, redness, visual disturbance and dryness.  Respiratory:  Negative for cough, shortness of breath and difficulty breathing.   Cardiovascular:  Negative for chest pain, palpitations, hypertension, irregular heartbeat and swelling in legs/feet.  Gastrointestinal:  Negative for blood in stool, constipation and diarrhea.  Endocrine: Negative for increased urination.  Genitourinary:  Negative for vaginal dryness.  Musculoskeletal:  Positive for joint pain, joint pain and morning stiffness. Negative for joint swelling, myalgias, muscle weakness, muscle tenderness and myalgias.  Skin:  Negative for color change, rash, hair loss, skin tightness, ulcers and sensitivity to sunlight.   Allergic/Immunologic: Negative for susceptible to infections.  Neurological:  Negative for dizziness, memory loss, night sweats and weakness.  Hematological:  Negative for swollen glands.  Psychiatric/Behavioral:  Positive for sleep disturbance. Negative for depressed mood. The patient is not nervous/anxious.        Increased stress   PMFS History:  Patient Active Problem List   Diagnosis Date Noted   Autoimmune hepatitis ([place]) 01/12/2019   Elevated liver function tests 12/25/2017   H/O adenomatous polyp of colon 12/12/2017   Malignant neoplasm of upper-outer quadrant of right breast in female, estrogen receptor positive ([place]) 07/12/2017   Back pain 12/14/2016   Overweight (BMI 25.0-29.9) 06/14/2015   Gastric ulceration    Hiatal hernia    History of lower GI bleeding    UGI bleed 01/27/2015   Vitamin D deficiency 06/15/2014   GERD (gastroesophageal reflux disease) 06/15/2014   Osteopenia 09/02/2013   Hypothyroidism 06/05/2012   HLD (hyperlipidemia) 06/05/2012   Asthma 06/05/2012    Past Medical History:  Diagnosis Date   Allergy    Arthritis    back painherniated disc lumbar   Asthma    Autoimmune hepatitis ([place])    Back pain    Cancer ([place]) 07/2017   right breast cancer, lumpectomy and 20 sessions of XRT   Complication of anesthesia    GERD (gastroesophageal reflux disease)    Hiatal hernia    Hyperlipidemia    Hypothyroidism    Osteopenia    PONV (postoperative nausea and vomiting)    asks for scop patch   Thyroid disease    Vasculopathy    lymphatic    Family  History  Problem Relation Age of Onset   Stroke Mother    Hypertension Mother    Alzheimer's disease Mother    Heart disease Sister    Hypertension Sister    Heart attack Sister        pace maker    Arthritis Daughter        very bad MVA    Hypertension Son    Seizures Son    Colon cancer Neg Hx    Liver cancer Neg Hx    Pancreatic disease Neg Hx    Past Surgical History:  Procedure  Laterality Date   BREAST LUMPECTOMY WITH RADIOACTIVE SEED AND SENTINEL LYMPH NODE BIOPSY Right 08/22/2017   Procedure: BREAST LUMPECTOMY WITH RADIOACTIVE SEED AND SENTINEL LYMPH NODE BIOPSY;  Surgeon: Erroll Luna, MD;  Location: [facility];  Service: General;  Laterality: Right;   COLONOSCOPY  2015   Dr. Earlean Shawl: Hemorrhoids, diverticulosis.  Next colonoscopy in 5 years due to previous history of adenomatous colon polyps   COLONOSCOPY N/A 11/18/2019   Procedure: COLONOSCOPY;  Surgeon: Daneil Dolin, MD;  [facility];  Service: Endoscopy;  Laterality: N/A;  9:30   CYSTOSCOPY     cystscopy     ESOPHAGOGASTRODUODENOSCOPY N/A 01/28/2015   Dr.Rourk- 3 small prepyloric/gastric ulcers- likely culprits causing bleeding. hiatal hernia, small gastic polyps not manipulated.   ESOPHAGOGASTRODUODENOSCOPY N/A 05/26/2015   Dr. Gala Romney: Small hiatal hernia, previous gastric ulcers completely healed.   KNEE SURGERY     LIVER BIOPSY  10/09/2018   TONSILLECTOMY     TUBAL LIGATION     Social History   Social History Narrative   Not on file   Immunization History  Administered Date(s) Administered   DTaP 10/28/2014   Hepatitis A 04/03/2016   Influenza, High Dose Seasonal PF 10/28/2014, 10/11/2015, 10/10/2016, 10/14/2017, 03/20/2018, 10/17/2018, 03/23/2019, 10/27/2019   Influenza,inj,Quad PF,6+ Mos 10/18/2014   Influenza,inj,quad, With Preservative 10/09/2018   Influenza-Unspecified 12/09/2012, 11/09/2013   Moderna Sars-Covid-2 Vaccination 02/20/2019, 03/21/2019   Pneumococcal Conjugate-13 12/09/2013   Pneumococcal Polysaccharide-23 05/04/2011, 10/22/2013, 10/28/2014, 03/20/2018, 10/27/2019   Tdap 06/09/2003, 06/09/2013   Typhoid Live 04/03/2016   Zoster, Live 04/21/2010     Objective: Vital Signs: BP 134/86 (BP Location: Left Arm, Patient Position: Sitting, Cuff Size: Small)    Pulse 93    Resp 12    Ht 5\' 6"  (1.676 m)    Wt 177 lb 6.4 oz (80.5 kg)    BMI 28.63 kg/m     Physical Exam Vitals and nursing note reviewed.  Constitutional:      Appearance: She is well-developed.  HENT:     Head: Normocephalic and atraumatic.  Eyes:     Conjunctiva/sclera: Conjunctivae normal.  Cardiovascular:     Rate and Rhythm: Normal rate and regular rhythm.     Heart sounds: Normal heart sounds.  Pulmonary:     Effort: Pulmonary effort is normal.     Breath sounds: Normal breath sounds.  Abdominal:     General: Bowel sounds are normal.     Palpations: Abdomen is soft.  Musculoskeletal:     Cervical back: Normal range of motion.  Lymphadenopathy:     Cervical: No cervical adenopathy.  Skin:    General: Skin is warm and dry.     Capillary Refill: Capillary refill takes less than 2 seconds.  Neurological:     Mental Status: She is alert and oriented to person, place, and time.  Psychiatric:  Behavior: Behavior normal.     Musculoskeletal Exam: C-spine was in good range of motion.  She had thoracic kyphosis.  Shoulder joints, elbow joints, wrist joints, MCPs PIPs and DIPs with good range of motion.  She had bilateral CMC, third MCP and PIP and DIP thickening with no synovitis.  Hip joints and knee joints with good range of motion.  There was no tenderness over ankles or MTPs.  CDAI Exam: CDAI Score: -- Patient Global: --; Provider Global: -- Swollen: --; Tender: -- Joint Exam 02/01/2021   No joint exam has been documented for this visit   There is currently no information documented on the homunculus. Go to the Rheumatology activity and complete the homunculus joint exam.  Investigation: No additional findings.  Imaging: No results found.  Recent Labs: Lab Results  Component Value Date   WBC 6.0 12/06/2020   HGB 14.8 12/06/2020   PLT 248 12/06/2020   NA 140 12/06/2020   K 4.3 12/06/2020   CL 105 12/06/2020   CO2 20 12/06/2020   GLUCOSE 98 12/06/2020   BUN 8 12/06/2020   CREATININE 0.90 12/06/2020   BILITOT 0.3 12/06/2020   ALKPHOS 82  12/06/2020   AST 21 12/06/2020   ALT 14 12/06/2020   PROT 6.4 12/06/2020   ALBUMIN 4.4 12/06/2020   CALCIUM 9.3 12/06/2020   GFRAA 84 09/03/2019    Speciality Comments: Imuran-discontinued due to cholestatic liver injury and upper GI side effects.  Procedures:  No procedures performed Allergies: Azathioprine, Statins, Sulfa antibiotics, Naproxen, Lipitor [atorvastatin], Premarin [conjugated estrogens], Ampicillin, Avelox [moxifloxacin hcl in nacl], Azithromycin, Bactrim [sulfamethoxazole-trimethoprim], Crestor [rosuvastatin calcium], Doxycycline, Levaquin [levofloxacin in d5w], Livalo [pitavastatin], Macrodantin [nitrofurantoin macrocrystal], and Trental [pentoxifylline er]   Assessment / Plan:     Visit Diagnoses: Positive ANA (antinuclear antibody) - AVISE index -0.4, ANA titer negative, RF 18, TPO antibody positive.  She had no synovitis on examination.  There is no history of oral ulcers, nasal ulcers, malar rash, photosensitivity, Raynaud's phenomenon, lymphadenopathy or inflammatory arthritis.  Autoimmune hepatitis ([place]) - Bx positive on 10/02/18.  She is followed by Dr. Nolon Lennert at [facility]  High risk medication use - Cellcept 500 mg 2 tablets p.o. every morning and 1 tablet p.o. q. at bedtime prescribed by GI.  Her labs are monitored by her gastroenterologist.  (discontinued Imuran-due to developing AZA induced cholestatic liver injury).  Primary osteoarthritis of both hands-DIP and PIP thickening was noted.  Bilateral CMC thickening was noted.  Right third MCP thickening was noted.  No synovitis was noted.  Joint protection muscle strengthening was discussed.  Primary osteoarthritis of both feet-she has off-and-on discomfort.  DDD (degenerative disc disease), cervical-she had some limitation with range of motion without discomfort.  DDD (degenerative disc disease), thoracic-thoracic kyphosis was noted.  Core strengthening exercises and back and stretching exercises  were discussed and demonstrated.  DDD (degenerative disc disease), lumbar-history of intermittent pain.  Osteopenia of multiple sites - DXA 03/07/20 T score -2.3 RFN.  DEXA findings were discussed with the patient.  Calcium rich diet with vitamin D was discussed.  Regular exercise was emphasized.  Vitamin D deficiency-she is on vitamin D 2000 units daily.  Other medical problems are listed as follows:  Malignant neoplasm of upper-outer quadrant of right breast in female, estrogen receptor positive ([place])  H/O adenomatous polyp of colon  History of lower GI bleeding  History of hyperlipidemia  History of gastroesophageal reflux (GERD)  Hiatal hernia  History of hypothyroidism  History of asthma  Orders: No orders of the defined types were placed in this encounter.  No orders of the defined types were placed in this encounter.    Follow-Up Instructions: Return in about 6 months (around 08/01/2021) for Osteoarthritis, Autoimmune disease.   Bo Merino, MD  Note - This record has been created using Editor, commissioning.  Chart creation errors have been sought, but may not always  have been located. Such creation errors do not reflect on  the standard of medical care.

## 2021-01-19 DIAGNOSIS — D849 Immunodeficiency, unspecified: Secondary | ICD-10-CM | POA: Diagnosis not present

## 2021-01-19 DIAGNOSIS — K754 Autoimmune hepatitis: Secondary | ICD-10-CM | POA: Diagnosis not present

## 2021-02-01 ENCOUNTER — Encounter: Payer: Self-pay | Admitting: Rheumatology

## 2021-02-01 ENCOUNTER — Ambulatory Visit (INDEPENDENT_AMBULATORY_CARE_PROVIDER_SITE_OTHER): Payer: Medicare Other | Admitting: Rheumatology

## 2021-02-01 ENCOUNTER — Other Ambulatory Visit: Payer: Self-pay

## 2021-02-01 VITALS — BP 134/86 | HR 93 | Resp 12 | Ht 66.0 in | Wt 177.4 lb

## 2021-02-01 DIAGNOSIS — Z8719 Personal history of other diseases of the digestive system: Secondary | ICD-10-CM

## 2021-02-01 DIAGNOSIS — E559 Vitamin D deficiency, unspecified: Secondary | ICD-10-CM

## 2021-02-01 DIAGNOSIS — R768 Other specified abnormal immunological findings in serum: Secondary | ICD-10-CM

## 2021-02-01 DIAGNOSIS — C50411 Malignant neoplasm of upper-outer quadrant of right female breast: Secondary | ICD-10-CM | POA: Diagnosis not present

## 2021-02-01 DIAGNOSIS — M5136 Other intervertebral disc degeneration, lumbar region: Secondary | ICD-10-CM

## 2021-02-01 DIAGNOSIS — M8589 Other specified disorders of bone density and structure, multiple sites: Secondary | ICD-10-CM | POA: Diagnosis not present

## 2021-02-01 DIAGNOSIS — M5030 Other cervical disc degeneration, unspecified cervical region: Secondary | ICD-10-CM

## 2021-02-01 DIAGNOSIS — Z86010 Personal history of colonic polyps: Secondary | ICD-10-CM | POA: Diagnosis not present

## 2021-02-01 DIAGNOSIS — M19041 Primary osteoarthritis, right hand: Secondary | ICD-10-CM | POA: Diagnosis not present

## 2021-02-01 DIAGNOSIS — Z8709 Personal history of other diseases of the respiratory system: Secondary | ICD-10-CM

## 2021-02-01 DIAGNOSIS — M5134 Other intervertebral disc degeneration, thoracic region: Secondary | ICD-10-CM

## 2021-02-01 DIAGNOSIS — Z79899 Other long term (current) drug therapy: Secondary | ICD-10-CM | POA: Diagnosis not present

## 2021-02-01 DIAGNOSIS — K449 Diaphragmatic hernia without obstruction or gangrene: Secondary | ICD-10-CM

## 2021-02-01 DIAGNOSIS — Z170 Estrogen receptor positive status [ER+]: Secondary | ICD-10-CM

## 2021-02-01 DIAGNOSIS — M19071 Primary osteoarthritis, right ankle and foot: Secondary | ICD-10-CM | POA: Diagnosis not present

## 2021-02-01 DIAGNOSIS — Z8639 Personal history of other endocrine, nutritional and metabolic disease: Secondary | ICD-10-CM

## 2021-02-01 DIAGNOSIS — K754 Autoimmune hepatitis: Secondary | ICD-10-CM | POA: Diagnosis not present

## 2021-02-01 DIAGNOSIS — M19042 Primary osteoarthritis, left hand: Secondary | ICD-10-CM

## 2021-02-01 DIAGNOSIS — M19072 Primary osteoarthritis, left ankle and foot: Secondary | ICD-10-CM

## 2021-02-14 IMAGING — CT CT HEAD W/O CM
3 series · 16 of 47 positions shown, 19 images · non-contrast
Comparison: No pertinent prior studies available for comparison.

CLINICAL DATA: Head trauma, intracranial venous injury suspected.
Additional history provided: Slip and fall, hitting back of head on
asphalt, history of breast cancer.

EXAM:
CT HEAD WITHOUT CONTRAST
TECHNIQUE: Contiguous axial images were obtained from the base of the skull
through the vertex without intravenous contrast.

[Series 2: head w o · axial · 0.47mm/px · z∈[+38,+178]mm · 10 of 34 slices shown, 13 images]
[im 3/34  brain]
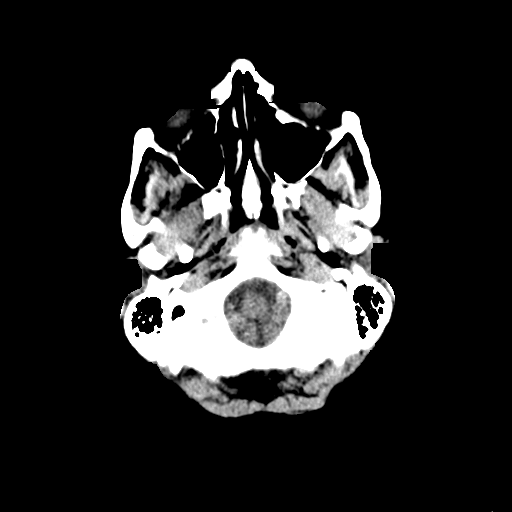
[im 3/34  bone]
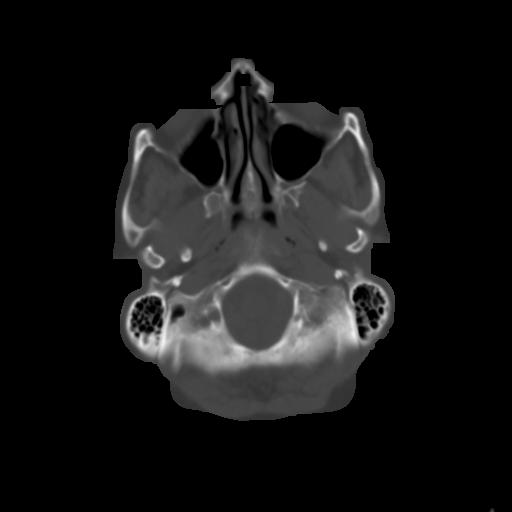
[im 6/34  brain]
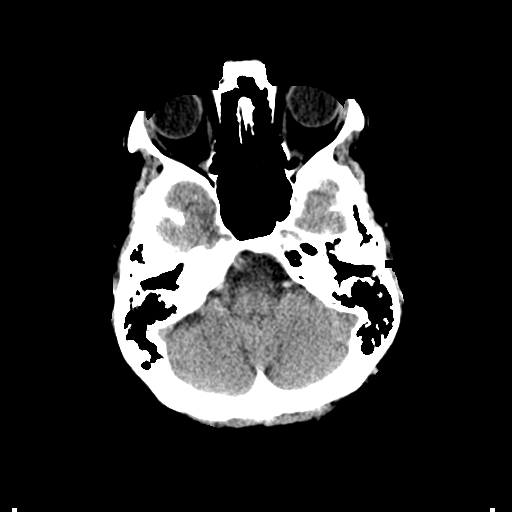
[im 10/34  brain]
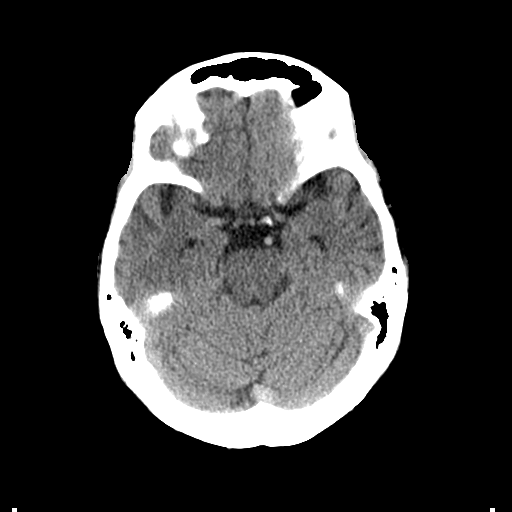
[im 12/34  brain]
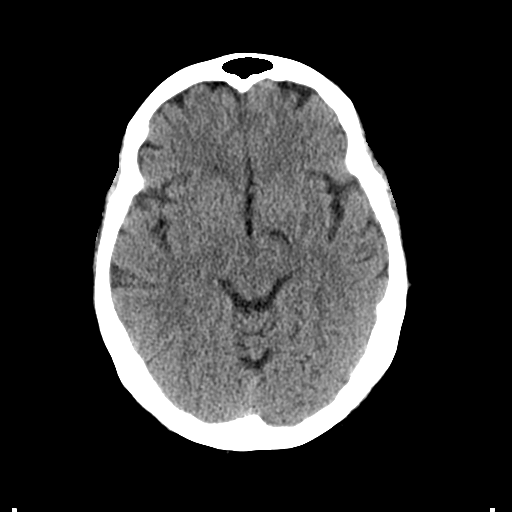
[im 15/34  brain]
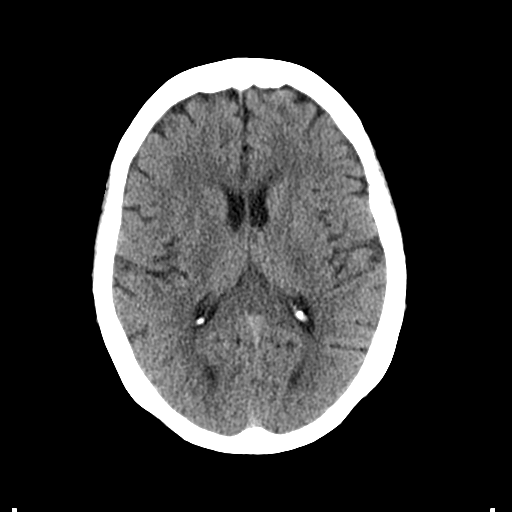
[im 15/34  bone]
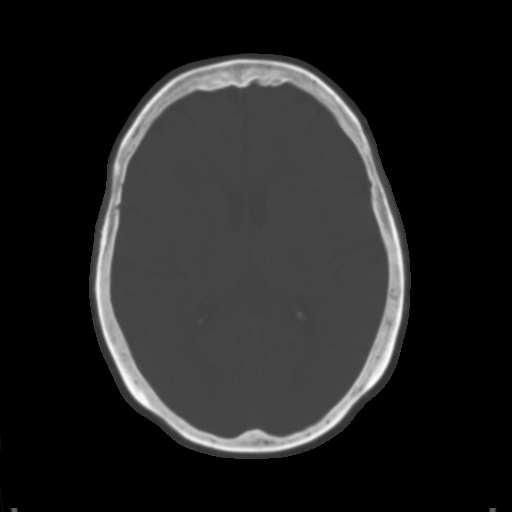
[im 19/34  brain]
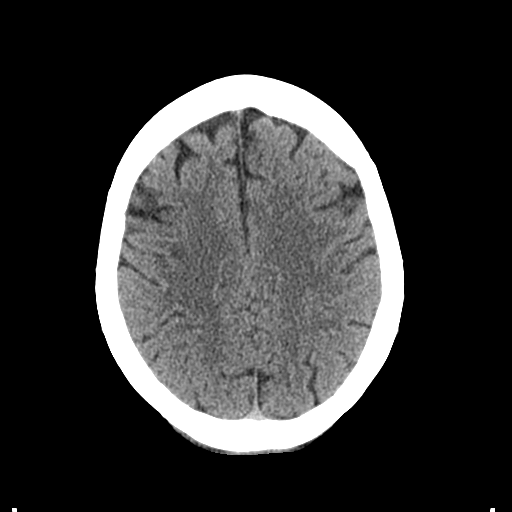
[im 22/34  brain]
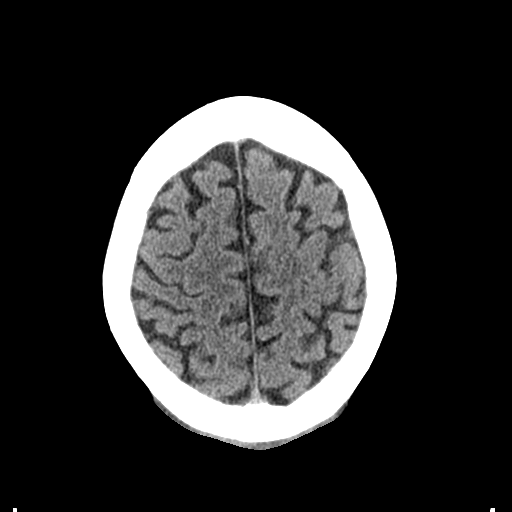
[im 26/34  brain]
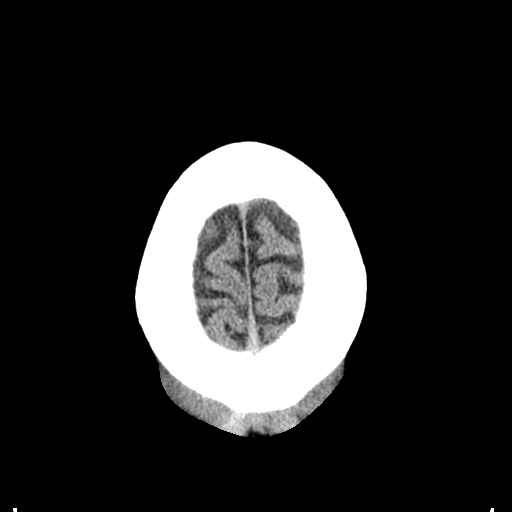
[im 28/34  brain]
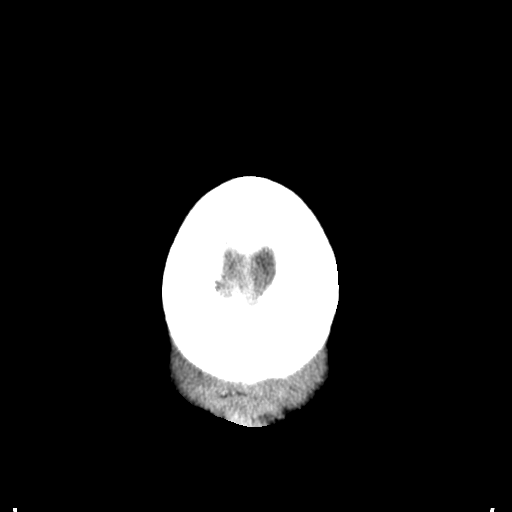
[im 28/34  bone]
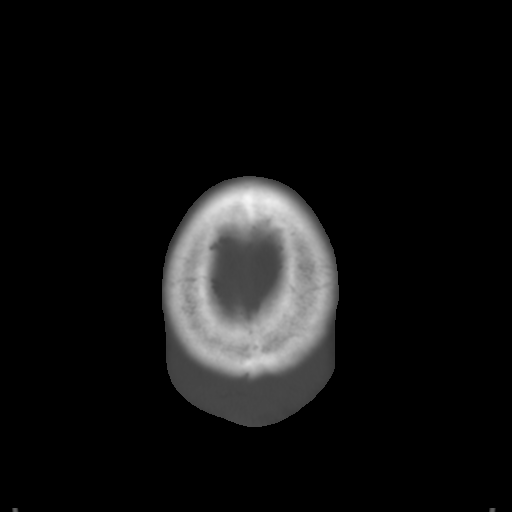
[im 31/34  brain]
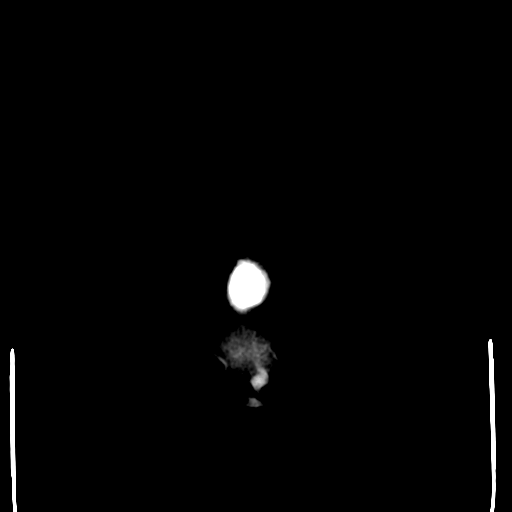

[Series 4: coronal soft · coronal · 0.37mm/px · 3 of 73 slices shown]
[im 25/73  brain]
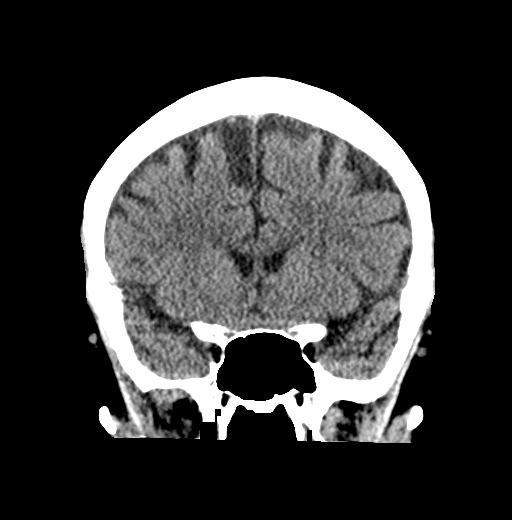
[im 33/73  brain]
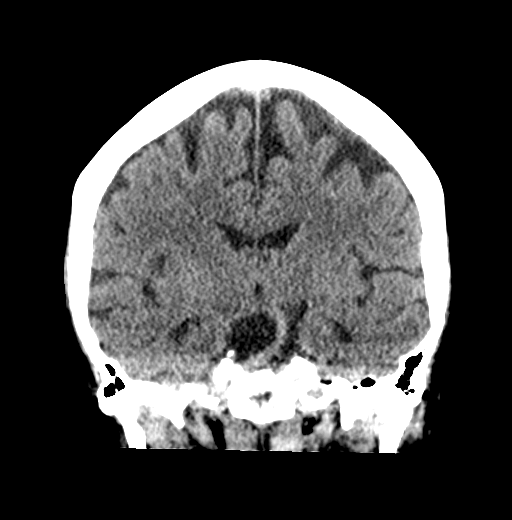
[im 41/73  brain]
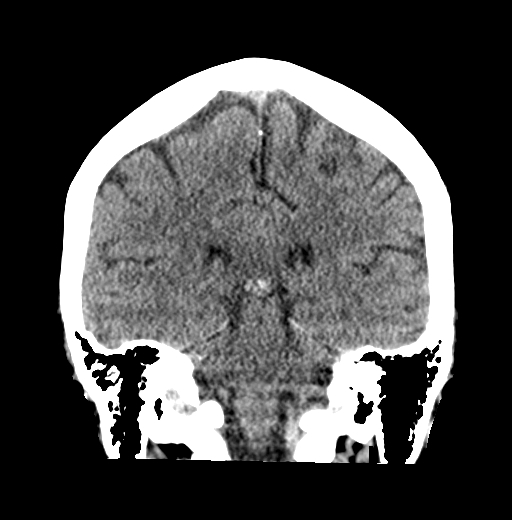

[Series 5: sagittal soft · sagittal · 0.39mm/px · 3 of 58 slices shown]
[im 20/58  brain]
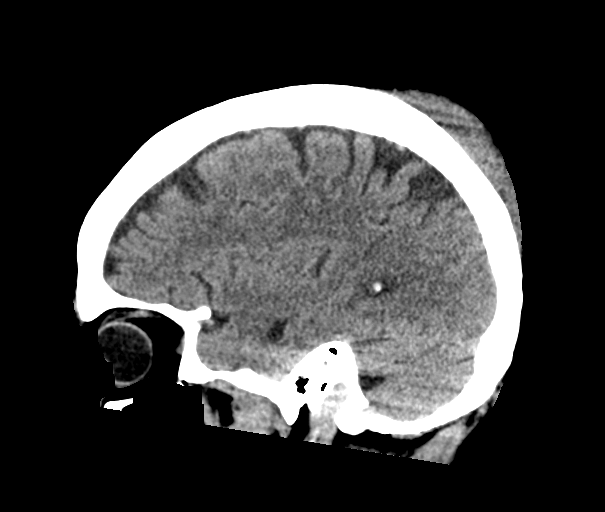
[im 29/58  brain]
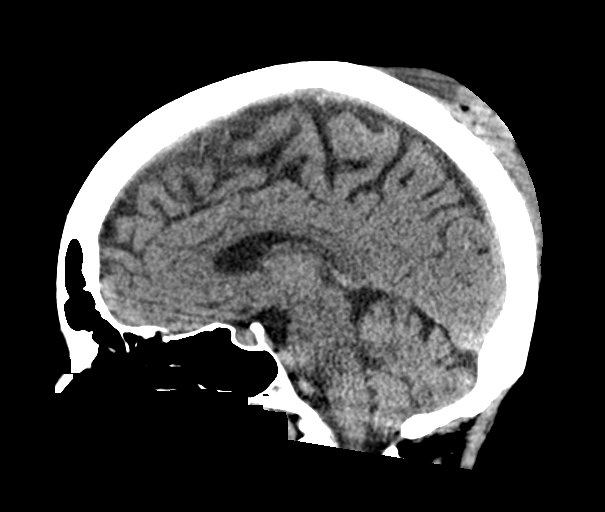
[im 39/58  brain]
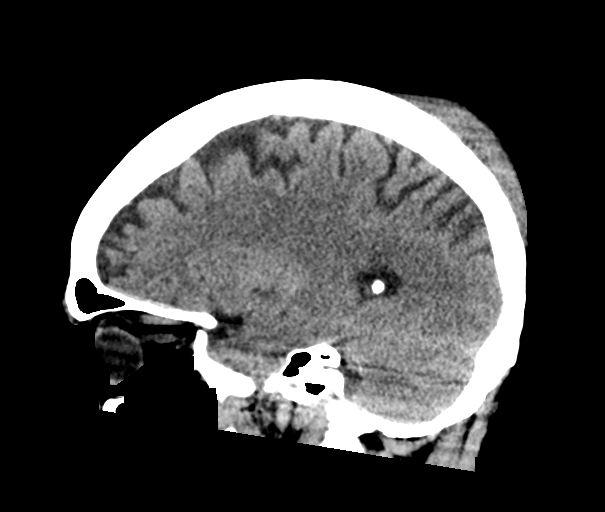

[16 of 47 positions shown; findings below may reference images not displayed]

FINDINGS: Brain:

Cerebral volume is normal for age.

There is no acute intracranial hemorrhage.

No demarcated cortical infarct.

No extra-axial fluid collection.

No evidence of intracranial mass on this noncontrast examination.

No midline shift.

Vascular: No hyperdense vessel.

Skull: Normal. Negative for fracture or focal lesion.

Sinuses/Orbits: Visualized orbits show no acute finding. Mild
ethmoid sinus mucosal thickening. Small bilateral maxillary sinus
mucous retention cysts. No significant mastoid effusion.

Other: Large posterior parietal scalp hematoma.
IMPRESSION: No evidence of acute intracranial abnormality.

Large posterior parietal scalp hematoma.

Mild ethmoid sinus mucosal thickening. Small bilateral maxillary
sinus mucous retention cysts.

## 2021-03-07 ENCOUNTER — Encounter: Payer: Self-pay | Admitting: Family

## 2021-03-07 ENCOUNTER — Ambulatory Visit (INDEPENDENT_AMBULATORY_CARE_PROVIDER_SITE_OTHER): Payer: Medicare Other | Admitting: Family

## 2021-03-07 VITALS — BP 129/84 | HR 62 | Temp 97.5°F | Ht 66.0 in | Wt 177.4 lb

## 2021-03-07 DIAGNOSIS — E559 Vitamin D deficiency, unspecified: Secondary | ICD-10-CM | POA: Diagnosis not present

## 2021-03-07 DIAGNOSIS — H6121 Impacted cerumen, right ear: Secondary | ICD-10-CM | POA: Diagnosis not present

## 2021-03-07 DIAGNOSIS — G8929 Other chronic pain: Secondary | ICD-10-CM | POA: Diagnosis not present

## 2021-03-07 DIAGNOSIS — E663 Overweight: Secondary | ICD-10-CM | POA: Diagnosis not present

## 2021-03-07 DIAGNOSIS — Z853 Personal history of malignant neoplasm of breast: Secondary | ICD-10-CM | POA: Diagnosis not present

## 2021-03-07 DIAGNOSIS — E785 Hyperlipidemia, unspecified: Secondary | ICD-10-CM

## 2021-03-07 DIAGNOSIS — E038 Other specified hypothyroidism: Secondary | ICD-10-CM

## 2021-03-07 DIAGNOSIS — M8589 Other specified disorders of bone density and structure, multiple sites: Secondary | ICD-10-CM

## 2021-03-07 DIAGNOSIS — J45909 Unspecified asthma, uncomplicated: Secondary | ICD-10-CM

## 2021-03-07 DIAGNOSIS — K219 Gastro-esophageal reflux disease without esophagitis: Secondary | ICD-10-CM

## 2021-03-07 DIAGNOSIS — M545 Low back pain, unspecified: Secondary | ICD-10-CM

## 2021-03-07 DIAGNOSIS — R7989 Other specified abnormal findings of blood chemistry: Secondary | ICD-10-CM | POA: Diagnosis not present

## 2021-03-07 NOTE — Patient Instructions (Signed)
Health Maintenance After Age 76 After age 76, you are at a higher risk for certain long-term diseases and infections as well as injuries from falls. Falls are a major cause of broken bones and head injuries in people who are older than age 76. Getting regular preventive care can help to keep you healthy and well. Preventive care includes getting regular testing and making lifestyle changes as recommended by your health care provider. Talk with your health care provider about: Which screenings and tests you should have. A screening is a test that checks for a disease when you have no symptoms. A diet and exercise plan that is right for you. What should I know about screenings and tests to prevent falls? Screening and testing are the best ways to find a health problem early. Early diagnosis and treatment give you the best chance of managing medical conditions that are common after age 76. Certain conditions and lifestyle choices may make you more likely to have a fall. Your health care provider may recommend: Regular vision checks. Poor vision and conditions such as cataracts can make you more likely to have a fall. If you wear glasses, make sure to get your prescription updated if your vision changes. Medicine review. Work with your health care provider to regularly review all of the medicines you are taking, including over-the-counter medicines. Ask your health care provider about any side effects that may make you more likely to have a fall. Tell your health care provider if any medicines that you take make you feel dizzy or sleepy. Strength and balance checks. Your health care provider may recommend certain tests to check your strength and balance while standing, walking, or changing positions. Foot health exam. Foot pain and numbness, as well as not wearing proper footwear, can make you more likely to have a fall. Screenings, including: Osteoporosis screening. Osteoporosis is a condition that causes  the bones to get weaker and break more easily. Blood pressure screening. Blood pressure changes and medicines to control blood pressure can make you feel dizzy. Depression screening. You may be more likely to have a fall if you have a fear of falling, feel depressed, or feel unable to do activities that you used to do. Alcohol use screening. Using too much alcohol can affect your balance and may make you more likely to have a fall. Follow these instructions at home: Lifestyle Do not drink alcohol if: Your health care provider tells you not to drink. If you drink alcohol: Limit how much you have to: 0-1 drink a day for women. 0-2 drinks a day for men. Know how much alcohol is in your drink. In the [place] one drink equals one 12 oz bottle of beer (355 mL), one 5 oz glass of wine (148 mL), or one 1 oz glass of hard liquor (44 mL). Do not use any products that contain nicotine or tobacco. These products include cigarettes, chewing tobacco, and vaping devices, such as e-cigarettes. If you need help quitting, ask your health care provider. Activity  Follow a regular exercise program to stay fit. This will help you maintain your balance. Ask your health care provider what types of exercise are appropriate for you. If you need a cane or walker, use it as recommended by your health care provider. Wear supportive shoes that have nonskid soles. Safety  Remove any tripping hazards, such as rugs, cords, and clutter. Install safety equipment such as grab bars in bathrooms and safety rails on stairs. Keep rooms and walkways   well-lit. General instructions Talk with your health care provider about your risks for falling. Tell your health care provider if: You fall. Be sure to tell your health care provider about all falls, even ones that seem minor. You feel dizzy, tiredness (fatigue), or off-balance. Take over-the-counter and prescription medicines only as told by your health care provider. These include  supplements. Eat a healthy diet and maintain a healthy weight. A healthy diet includes low-fat dairy products, low-fat (lean) meats, and fiber from whole grains, beans, and lots of fruits and vegetables. Stay current with your vaccines. Schedule regular health, dental, and eye exams. Summary Having a healthy lifestyle and getting preventive care can help to protect your health and wellness after age 76. Screening and testing are the best way to find a health problem early and help you avoid having a fall. Early diagnosis and treatment give you the best chance for managing medical conditions that are more common for people who are older than age 76. Falls are a major cause of broken bones and head injuries in people who are older than age 76. Take precautions to prevent a fall at home. Work with your health care provider to learn what changes you can make to improve your health and wellness and to prevent falls. This information is not intended to replace advice given to you by your health care provider. Make sure you discuss any questions you have with your health care provider. Document Revised: 05/16/2020 Document Reviewed: 05/16/2020 Elsevier Patient Education  2022 [employer]vier Inc.  

## 2021-03-07 NOTE — Progress Notes (Signed)
Subjective:    Patient ID: Cheryl Perry, female    DOB: 10/07/1945, 76 y.o.   MRN: [id number]  Chief Complaint  Patient presents with   Medical Management of Chronic Issues   Pt presents to the office today for chronic follow up.  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 She is currently taking Cellcept  and doing well.    Pt is followed by allergen specialists annually for her asthma. PT is followed by Ortho as needed for chronic back pain and left knee pain.   She has history of right breast cancer. She completed radiation in 10/29/17.   She has osteopenia and takes calcium and vit D. She does do 1 mile of walking a day.  Asthma She complains of cough, frequent throat clearing and hoarse voice. There is no difficulty breathing or wheezing. This is a chronic problem. The current episode started more than 1 year ago. The problem occurs intermittently. The problem has been waxing and waning. Pertinent negatives include no heartburn. Her past medical history is significant for asthma.  Gastroesophageal Reflux She complains of coughing and a hoarse voice. She reports no belching, no heartburn or no wheezing. This is a chronic problem. The current episode started more than 1 year ago. The problem occurs occasionally. Pertinent negatives include no fatigue. She has tried a histamine-2 antagonist for the symptoms. The treatment provided moderate relief.  Thyroid Problem Presents for follow-up visit. Symptoms include dry skin and hoarse voice. Patient reports no anxiety, depressed mood or fatigue. The symptoms have been stable. Her past medical history is significant for hyperlipidemia.  Hyperlipidemia This is a chronic problem. The current episode started more than 1 year ago. The problem is uncontrolled. Exacerbating diseases include obesity. Current antihyperlipidemic treatment includes diet change.  The current treatment provides mild improvement of lipids. Risk factors for coronary artery disease include dyslipidemia, a sedentary lifestyle and post-menopausal.  Back Pain This is a chronic problem. The current episode started more than 1 year ago. The problem occurs intermittently. The problem has been waxing and waning since onset. The pain is present in the lumbar spine. The pain is at a severity of 3/10. The pain is moderate.     Review of Systems  Constitutional:  Negative for fatigue.  HENT:  Positive for hoarse voice.   Respiratory:  Positive for cough. Negative for wheezing.   Gastrointestinal:  Negative for heartburn.  Musculoskeletal:  Positive for back pain.  Psychiatric/Behavioral:  The patient is not nervous/anxious.   All other systems reviewed and are negative.     Objective:   Physical Exam Vitals reviewed.  Constitutional:      General: She is not in acute distress.    Appearance: She is well-developed. She is obese.  HENT:     Head: Normocephalic and atraumatic.     Right Ear: Tympanic membrane normal. There is impacted cerumen.     Left Ear: Tympanic membrane normal.  Eyes:     Pupils: Pupils are equal, round, and reactive to light.  Neck:     Thyroid: No thyromegaly.  Cardiovascular:     Rate and Rhythm: Normal rate and regular rhythm.     Heart sounds: Normal heart sounds. No murmur heard. Pulmonary:     Effort: Pulmonary effort is normal. No respiratory distress.     Breath sounds: Normal breath sounds. No wheezing.  Abdominal:  General: Bowel sounds are normal. There is no distension.     Palpations: Abdomen is soft.     Tenderness: There is no abdominal tenderness.  Musculoskeletal:        General: No tenderness. Normal range of motion.     Cervical back: Normal range of motion and neck supple.  Skin:    General: Skin is warm and dry.  Neurological:     Mental Status: She is alert and oriented to person, place, and time.     Cranial  Nerves: No cranial nerve deficit.     Deep Tendon Reflexes: Reflexes are normal and symmetric.  Psychiatric:        Behavior: Behavior normal.        Thought Content: Thought content normal.        Judgment: Judgment normal.   Right ear washed with warm with peroxide, TM WNL.   BP 129/84    Pulse 62    Temp (!) 97.5 F (36.4 C) (Temporal)    Ht 5' 6"  (1.676 m)    Wt 177 lb 6.4 oz (80.5 kg)    BMI 28.63 kg/m      Assessment & Plan:  Cheryl Perry comes in today with chief complaint of Medical Management of Chronic Issues   Diagnosis and orders addressed:  1. Uncomplicated asthma, unspecified asthma severity, unspecified whether persistent - CBC with Differential/Platelet - BMP8+EGFR  2. Gastroesophageal reflux disease without esophagitis - CBC with Differential/Platelet - BMP8+EGFR  3. Other specified hypothyroidism - CBC with Differential/Platelet - BMP8+EGFR - TSH  4. Osteopenia of multiple sites - CBC with Differential/Platelet - BMP8+EGFR  5. Overweight (BMI 25.0-29.9) - CBC with Differential/Platelet - BMP8+EGFR  6. Vitamin D deficiency - CBC with Differential/Platelet - BMP8+EGFR - VITAMIN D 25 Hydroxy (Vit-D Deficiency, Fractures)  7. Hyperlipidemia, unspecified hyperlipidemia type - CBC with Differential/Platelet - BMP8+EGFR  8. Chronic bilateral low back pain without sciatica - CBC with Differential/Platelet - BMP8+EGFR  9. Elevated liver function tests - CBC with Differential/Platelet - BMP8+EGFR - Hepatic function panel  10. HX: breast cancer - CBC with Differential/Platelet - BMP8+EGFR  11. Impacted cerumen of right ear   Labs pending Health Maintenance reviewed Diet and exercise encouraged  Follow up plan: 6 months    [name], FNP

## 2021-03-08 LAB — CBC WITH DIFFERENTIAL/PLATELET
Basophils Absolute: 0.1 10*3/uL (ref 0.0–0.2)
Basos: 1 %
EOS (ABSOLUTE): 0.1 10*3/uL (ref 0.0–0.4)
Eos: 1 %
Hematocrit: 42.9 % (ref 34.0–46.6)
Hemoglobin: 14.2 g/dL (ref 11.1–15.9)
Immature Grans (Abs): 0 10*3/uL (ref 0.0–0.1)
Immature Granulocytes: 0 %
Lymphocytes Absolute: 0.9 10*3/uL (ref 0.7–3.1)
Lymphs: 17 %
MCH: 31.8 pg (ref 26.6–33.0)
MCHC: 33.1 g/dL (ref 31.5–35.7)
MCV: 96 fL (ref 79–97)
Monocytes Absolute: 0.5 10*3/uL (ref 0.1–0.9)
Monocytes: 10 %
Neutrophils Absolute: 3.6 10*3/uL (ref 1.4–7.0)
Neutrophils: 71 %
Platelets: 222 10*3/uL (ref 150–450)
RBC: 4.46 x10E6/uL (ref 3.77–5.28)
RDW: 12.6 % (ref 11.7–15.4)
WBC: 5.1 10*3/uL (ref 3.4–10.8)

## 2021-03-08 LAB — BMP8+EGFR
BUN/Creatinine Ratio: 12 (ref 12–28)
BUN: 10 mg/dL (ref 8–27)
CO2: 21 mmol/L (ref 20–29)
Calcium: 9.2 mg/dL (ref 8.7–10.3)
Chloride: 105 mmol/L (ref 96–106)
Creatinine, Ser: 0.82 mg/dL (ref 0.57–1.00)
Glucose: 91 mg/dL (ref 70–99)
Potassium: 4.2 mmol/L (ref 3.5–5.2)
Sodium: 142 mmol/L (ref 134–144)
eGFR: 75 mL/min/{1.73_m2} (ref >59)

## 2021-03-08 LAB — HEPATIC FUNCTION PANEL
ALT: 12 IU/L (ref 0–32)
AST: 18 IU/L (ref 0–40)
Albumin: 4.3 g/dL (ref 3.7–4.7)
Alkaline Phosphatase: 77 IU/L (ref 44–121)
Bilirubin Total: 0.4 mg/dL (ref 0.0–1.2)
Bilirubin, Direct: 0.12 mg/dL (ref 0.00–0.40)
Total Protein: 6.1 g/dL (ref 6.0–8.5)

## 2021-03-08 LAB — VITAMIN D 25 HYDROXY (VIT D DEFICIENCY, FRACTURES): Vit D, 25-Hydroxy: 51 ng/mL (ref 30.0–100.0)

## 2021-03-08 LAB — TSH: TSH: 3.48 u[IU]/mL (ref 0.450–4.500)

## 2021-03-17 ENCOUNTER — Ambulatory Visit (INDEPENDENT_AMBULATORY_CARE_PROVIDER_SITE_OTHER): Payer: Medicare Other | Admitting: Nurse Practitioner

## 2021-03-17 ENCOUNTER — Encounter: Payer: Self-pay | Admitting: Nurse Practitioner

## 2021-03-17 VITALS — BP 120/77 | HR 85 | Temp 98.5°F | Ht 66.0 in | Wt 176.0 lb

## 2021-03-17 DIAGNOSIS — J45901 Unspecified asthma with (acute) exacerbation: Secondary | ICD-10-CM

## 2021-03-17 MED ORDER — SALINE SPRAY 0.65 % NA SOLN: 1.0000 | NASAL | 2 refills | Status: DC | PRN

## 2021-03-17 MED ORDER — PREDNISONE 10 MG (21) PO TBPK: ORAL_TABLET | ORAL | 0 refills | Status: DC

## 2021-03-17 NOTE — Progress Notes (Signed)
Acute Office Visit  Subjective:    Patient ID: CODI FOLKERTS, female    DOB: 03-19-1945, 76 y.o.   MRN: [id number]  Chief Complaint  Patient presents with   asthma flare up    Asthma She complains of chest tightness, cough and difficulty breathing. There is no hoarse voice or shortness of breath. This is a new problem. The current episode started in the past 7 days. The problem occurs constantly. The problem has been unchanged. The cough is non-productive. Associated symptoms include nasal congestion. Pertinent negatives include no fever, headaches, heartburn, malaise/fatigue, sneezing or sore throat. Her symptoms are aggravated by pollen. Her past medical history is significant for asthma.    Past Medical History:  Diagnosis Date   Allergy    Arthritis    back painherniated disc lumbar   Asthma    Autoimmune hepatitis ([place])    Back pain    Cancer ([place]) 07/2017   right breast cancer, lumpectomy and 20 sessions of XRT   Complication of anesthesia    GERD (gastroesophageal reflux disease)    Hiatal hernia    Hyperlipidemia    Hypothyroidism    Osteopenia    PONV (postoperative nausea and vomiting)    asks for scop patch   Thyroid disease    Vasculopathy    lymphatic    Past Surgical History:  Procedure Laterality Date   BREAST LUMPECTOMY WITH RADIOACTIVE SEED AND SENTINEL LYMPH NODE BIOPSY Right 08/22/2017   Procedure: BREAST LUMPECTOMY WITH RADIOACTIVE SEED AND SENTINEL LYMPH NODE BIOPSY;  Surgeon: Erroll Luna, MD;  Location: [facility];  Service: General;  Laterality: Right;   COLONOSCOPY  2015   Dr. Earlean Shawl: Hemorrhoids, diverticulosis.  Next colonoscopy in 5 years due to previous history of adenomatous colon polyps   COLONOSCOPY N/A 11/18/2019   Procedure: COLONOSCOPY;  Surgeon: Daneil Dolin, MD;  Location: [facility];  Service: Endoscopy;  Laterality: N/A;  9:30   CYSTOSCOPY     cystscopy     ESOPHAGOGASTRODUODENOSCOPY N/A 01/28/2015    Dr.Rourk- 3 small prepyloric/gastric ulcers- likely culprits causing bleeding. hiatal hernia, small gastic polyps not manipulated.   ESOPHAGOGASTRODUODENOSCOPY N/A 05/26/2015   Dr. Gala Romney: Small hiatal hernia, previous gastric ulcers completely healed.   KNEE SURGERY     LIVER BIOPSY  10/09/2018   TONSILLECTOMY     TUBAL LIGATION      Family History  Problem Relation Age of Onset   Stroke Mother    Hypertension Mother    Alzheimer's disease Mother    Heart disease Sister    Hypertension Sister    Heart attack Sister        pace maker    Arthritis Daughter        very bad MVA    Hypertension Son    Seizures Son    Colon cancer Neg Hx    Liver cancer Neg Hx    Pancreatic disease Neg Hx     Social History   Socioeconomic History   Marital status: Married    Spouse name: Not on file   Number of children: 2   Years of education: 16   Highest education level: Bachelor's degree (e.g., BA, AB, BS)  Occupational History   Occupation: retired     Comment: HR work  / Astronomer   Tobacco Use   Smoking status: Former    Types: Cigarettes    Quit date: 06/06/1974    Years since quitting: 31.8  Smokeless tobacco: Never   Tobacco comments:    Quit x 40 years ago  Vaping Use   Vaping Use: Never used  Substance and Sexual Activity   Alcohol use: No    Alcohol/week: 0.0 standard drinks   Drug use: No   Sexual activity: Not on file  Other Topics Concern   Not on file  Social History Narrative   Not on file   Social Determinants of Health   Financial Resource Strain: Not on file  Food Insecurity: Not on file  Transportation Needs: Not on file  Physical Activity: Not on file  Stress: Not on file  Social Connections: Not on file  Intimate Partner Violence: Not on file    Outpatient Medications Prior to Visit  Medication Sig Dispense Refill   albuterol (PROVENTIL HFA) 108 (90 Base) MCG/ACT inhaler Inhale 2 puffs into the lungs every 6 (six) hours as  needed for wheezing. May use only 3 times a month 18 g 3   calcium carbonate (TUMS - DOSED IN MG ELEMENTAL CALCIUM) 500 MG chewable tablet Chew 1 tablet by mouth 3 (three) times daily as needed for indigestion or heartburn.     Calcium Carbonate-Vitamin D 600-400 MG-UNIT tablet Take 2 tablets by mouth daily at 12 noon.      Cholecalciferol (VITAMIN D) 2000 UNITS CAPS Take 2,000 Units by mouth daily.      EPINEPHrine 0.3 mg/0.3 mL IJ SOAJ injection      famotidine (PEPCID) 20 MG tablet Take 20 mg by mouth 2 (two) times daily.     fluticasone-salmeterol (ADVAIR HFA) 115-21 MCG/ACT inhaler 2 puffs     levothyroxine (SYNTHROID) 75 MCG tablet TAKE 1 TABLET (75 MCG TOTAL)  BY MOUTH DAILY BEFORE BREAKFAST. 90 tablet 3   loratadine (CLARITIN) 10 MG tablet Take 10 mg by mouth daily.     methocarbamol (ROBAXIN) 750 MG tablet Take 375 mg by mouth every 6 (six) hours as needed for muscle spasms.   2   mycophenolate (CELLCEPT) 500 MG tablet Take 1,000 mg by mouth 2 (two) times daily. Patient takes 1,000 mg in the morning and 500 mg at night.     triamcinolone (NASACORT) 55 MCG/ACT AERO nasal inhaler 1-2 sprays ea nostril     No facility-administered medications prior to visit.    Allergies  Allergen Reactions   Azathioprine Other (See Comments)    Cholestatic liver injury    Statins Other (See Comments)    Elevated LFT's   Sulfa Antibiotics Hives   Naproxen Hives and Itching    Note - also has Ulcer - cant take    Lipitor [Atorvastatin]     mylagias   Premarin [Conjugated Estrogens] Itching    tightness in chest   Ampicillin Rash   Avelox [Moxifloxacin Hcl In Nacl] Rash   Azithromycin Rash    Concern for DRESS after useage   Bactrim [Sulfamethoxazole-Trimethoprim] Hives   Crestor [Rosuvastatin Calcium]     Elevated liver results?   Doxycycline Rash    Concern for DRESS after usage.   Levaquin [Levofloxacin In D5w] Rash   Livalo [Pitavastatin] Other (See Comments)    myalgias   Macrodantin  [Nitrofurantoin Macrocrystal] Hives and Rash   Trental [Pentoxifylline Er] Rash         Review of Systems  Constitutional: Negative.  Negative for chills, fatigue, fever and malaise/fatigue.  HENT:  Negative for hoarse voice, sinus pressure, sneezing and sore throat.   Respiratory:  Positive for cough. Negative for shortness  of breath.   Gastrointestinal:  Negative for heartburn.  Neurological:  Negative for headaches.  All other systems reviewed and are negative.     Objective:    Physical Exam Vitals and nursing note reviewed.  Constitutional:      Appearance: Normal appearance.  HENT:     Head: Normocephalic.     Right Ear: External ear normal.     Left Ear: External ear normal.     Nose: Congestion present.     Mouth/Throat:     Mouth: Mucous membranes are moist.     Pharynx: Oropharynx is clear.  Eyes:     Conjunctiva/sclera: Conjunctivae normal.  Cardiovascular:     Rate and Rhythm: Normal rate and regular rhythm.     Pulses: Normal pulses.     Heart sounds: Normal heart sounds.  Pulmonary:     Effort: Pulmonary effort is normal.     Breath sounds: Wheezing present.  Abdominal:     General: Bowel sounds are normal.  Musculoskeletal:        General: Normal range of motion.  Skin:    General: Skin is warm.     Findings: No rash.  Neurological:     Mental Status: She is alert and oriented to person, place, and time.  Psychiatric:        Mood and Affect: Mood normal.        Behavior: Behavior normal.    BP 120/77    Pulse 85    Temp 98.5 F (36.9 C)    Ht _0  (1.676 m)    Wt 176 lb (79.8 kg)    SpO2 96%    BMI 28.41 kg/m  Wt Readings from Last 3 Encounters:  03/17/21 176 lb (79.8 kg)  03/07/21 177 lb 6.4 oz (80.5 kg)  02/01/21 177 lb 6.4 oz (80.5 kg)    Health Maintenance Due  Topic Date Due   Zoster Vaccines- Shingrix (2 of 2) 01/16/2021    There are no preventive care reminders to display for this patient.   Lab Results  Component Value Date    TSH 3.480 03/07/2021   Lab Results  Component Value Date   WBC 5.1 03/07/2021   HGB 14.2 03/07/2021   HCT 42.9 03/07/2021   MCV 96 03/07/2021   PLT 222 03/07/2021   Lab Results  Component Value Date   NA 142 03/07/2021   K 4.2 03/07/2021   CO2 21 03/07/2021   GLUCOSE 91 03/07/2021   BUN 10 03/07/2021   CREATININE 0.82 03/07/2021   BILITOT 0.4 03/07/2021   ALKPHOS 77 03/07/2021   AST 18 03/07/2021   ALT 12 03/07/2021   PROT 6.1 03/07/2021   ALBUMIN 4.3 03/07/2021   CALCIUM 9.2 03/07/2021   ANIONGAP 8 12/28/2018   EGFR 75 03/07/2021   Lab Results  Component Value Date   CHOL 211 (H) 12/17/2017   Lab Results  Component Value Date   HDL 71 12/17/2017   Lab Results  Component Value Date   LDLCALC 121 (H) 12/17/2017   Lab Results  Component Value Date   TRIG 93 12/17/2017   Lab Results  Component Value Date   CHOLHDL 3.0 12/17/2017   Lab Results  Component Value Date   HGBA1C 5.5 06/19/2017       Assessment & Plan:  Take meds as prescribed - Use a cool mist humidifier  -Use saline nose sprays frequently -prednisone taper  -Force fluids -For fever or aches or pains- take Tylenol  or ibuprofen. -If symptoms do not improve, she may need to be COVID tested to rule this out Follow up with worsening unresolved symptoms  Problem List Items Addressed This Visit       Respiratory   Asthma - Primary   Relevant Medications   predniSONE (STERAPRED UNI-PAK 21 TAB) 10 MG (21) TBPK tablet     Meds ordered this encounter  Medications   predniSONE (STERAPRED UNI-PAK 21 TAB) 10 MG (21) TBPK tablet    Sig: 6 tablet day 1, 5 tablet day 2, 4 tablet day 3, 3 tablet day 4,  2 tablet day five, 1 tablet day 6.    Dispense:  1 tablet    Refill:  0    Order Specific Question:   Supervising Provider    Answer:   Jeneen Rinks   sodium chloride (OCEAN) 0.65 % SOLN nasal spray    Sig: Place 1 spray into both nostrils as needed for congestion.    Dispense:   30 mL    Refill:  2    Order Specific Question:   Supervising Provider    Answer:   Claretta Fraise [[id number]]     Ivy Lynn, NP

## 2021-03-17 NOTE — Patient Instructions (Signed)
Asthma, Adult Asthma is a long-term (chronic) condition that causes recurrent episodes in which the airways become tight and narrow. The airways are the passages that lead from the nose and mouth down into the lungs. Asthma episodes, also called asthma attacks, can cause coughing, wheezing, shortness of breath, and chest pain. The airways can also fill with mucus. During an attack, it can be difficult to breathe. Asthma attacks can range from minor to life threatening. Asthma cannot be cured, but medicines and lifestyle changes can help control it and treat acute attacks. What are the causes? This condition is believed to be caused by inherited (genetic) and environmental factors, but its exact cause is not known. There are many things that can bring on an asthma attack or make asthma symptoms worse (triggers). Asthma triggers are different for each person. Common triggers include: Mold. Dust. Cigarette smoke. Cockroaches. Things that can cause allergy symptoms (allergens), such as animal dander or pollen from trees or grass. Air pollutants such as household cleaners, wood smoke, smog, or Advertising account planner. Cold air, weather changes, and winds (which increase molds and pollen in the air). Strong emotional expressions such as crying or laughing hard. Stress. Certain medicines (such as aspirin) or types of medicines (such as beta-blockers). Sulfites in foods and drinks. Foods and drinks that may contain sulfites include dried fruit, potato chips, and sparkling grape juice. Infections or inflammatory conditions such as the flu, a cold, or inflammation of the nasal membranes (rhinitis). Gastroesophageal reflux disease (GERD). Exercise or strenuous activity. What are the signs or symptoms? Symptoms of this condition may occur right after asthma is triggered or many hours later. Symptoms include: Wheezing. This can sound like whistling when you breathe. Excessive nighttime or early morning  coughing. Frequent or severe coughing with a common cold. Chest tightness. Shortness of breath. Tiredness (fatigue) with minimal activity. How is this diagnosed? This condition is diagnosed based on: Your medical history. A physical exam. Tests, which may include: Lung function studies and pulmonary studies (spirometry). These tests can evaluate the flow of air in your lungs. Allergy tests. Imaging tests, such as X-rays. How is this treated? There is no cure for this condition, but treatment can help control your symptoms. Treatment for asthma usually involves: Identifying and avoiding your asthma triggers. Using medicines to control your symptoms. Generally, two types of medicines are used to treat asthma: Controller medicines. These help prevent asthma symptoms from occurring. They are usually taken every day. Fast-acting reliever or rescue medicines. These quickly relieve asthma symptoms by widening the narrow and tight airways. They are used as needed and provide short-term relief. Using supplemental oxygen. This may be needed during a severe episode. Using other medicines, such as: Allergy medicines, such as antihistamines, if your asthma attacks are triggered by allergens. Immune medicines (immunomodulators). These are medicines that help control the immune system. Creating an asthma action plan. An asthma action plan is a written plan for managing and treating your asthma attacks. This plan includes: A list of your asthma triggers and how to avoid them. Information about when medicines should be taken and when their dosage should be changed. Instructions about using a device called a peak flow meter. A peak flow meter measures how well the lungs are working and the severity of your asthma. It helps you monitor your condition. Follow these instructions at home: Controlling your home environment Control your home environment in the following ways to help avoid triggers and prevent  asthma attacks: Change your heating  and air conditioning filter regularly. Limit your use of fireplaces and wood stoves. Get rid of pests (such as roaches and mice) and their droppings. Throw away plants if you see mold on them. Clean floors and dust surfaces regularly. Use unscented cleaning products. Try to have someone else vacuum for you regularly. Stay out of rooms while they are being vacuumed and for a short while afterward. If you vacuum, use a dust mask from a hardware store, a double-layered or microfilter vacuum cleaner bag, or a vacuum cleaner with a HEPA filter. Replace carpet with wood, tile, or vinyl flooring. Carpet can trap dander and dust. Use allergy-proof pillows, mattress covers, and box spring covers. Keep your bedroom a trigger-free room. Avoid pets and keep windows closed when allergens are in the air. Wash beddings every week in hot water and dry them in a dryer. Use blankets that are made of polyester or cotton. Clean bathrooms and kitchens with bleach. If possible, have someone repaint the walls in these rooms with mold-resistant paint. Stay out of the rooms that are being cleaned and painted. Wash your hands often with soap and water. If soap and water are not available, use hand sanitizer. Do not allow anyone to smoke in your home. General instructions Take over-the-counter and prescription medicines only as told by your health care provider. Speak with your health care provider if you have questions about how or when to take the medicines. Make note if you are requiring more frequent dosages. Do not use any products that contain nicotine or tobacco, such as cigarettes and e-cigarettes. If you need help quitting, ask your health care provider. Also, avoid being exposed to secondhand smoke. Use a peak flow meter as told by your health care provider. Record and keep track of the readings. Understand and use the asthma action plan to help minimize, or stop an asthma  attack, without needing to seek medical care. Make sure you stay up to date on your yearly vaccinations as told by your health care provider. This may include vaccines for the flu and pneumonia. Avoid outdoor activities when allergen counts are high and when air quality is low. Wear a ski mask that covers your nose and mouth during outdoor winter activities. Exercise indoors on cold days if you can. Warm up before exercising, and take time for a cool-down period after exercise. Keep all follow-up visits as told by your health care provider. This is important. Where to find more information For information about asthma, turn to the Centers for Disease Control and Prevention at http://www.clark.net/ For air quality information, turn to [employer] at https://www.miller-reyes.info/ Contact a health care provider if: You have wheezing, shortness of breath, or a cough even while you are taking medicine to prevent attacks. The mucus you cough up (sputum) is thicker than usual. Your sputum changes from clear or white to yellow, green, gray, or bloody. Your medicines are causing side effects, such as a rash, itching, swelling, or trouble breathing. You need to use a reliever medicine more than 2-3 times a week. Your peak flow reading is still at 50-79% of your personal best after following your action plan for 1 hour. You have a fever. Get help right away if: You are getting worse and do not respond to treatment during an asthma attack. You are short of breath when at rest or when doing very little physical activity. You have difficulty eating, drinking, or talking. You have chest pain or tightness. You develop a fast heartbeat or  palpitations. You have a bluish color to your lips or fingernails. You are light-headed or dizzy, or you faint. Your peak flow reading is less than 50% of your personal best. You feel too tired to breathe normally. Summary Asthma is a long-term (chronic) condition that causes recurrent  episodes in which the airways become tight and narrow. These episodes can cause coughing, wheezing, shortness of breath, and chest pain. Asthma cannot be cured, but medicines and lifestyle changes can help control it and treat acute attacks. Make sure you understand how to avoid triggers and how and when to use your medicines. Asthma attacks can range from minor to life threatening. Get help right away if you have an asthma attack and do not respond to treatment with your usual rescue medicines. This information is not intended to replace advice given to you by your health care provider. Make sure you discuss any questions you have with your health care provider. Document Revised: 09/25/2019 Document Reviewed: 04/29/2019 Elsevier Patient Education  2022 [employer].

## 2021-03-24 ENCOUNTER — Telehealth: Payer: Self-pay | Admitting: Family

## 2021-03-24 ENCOUNTER — Encounter: Payer: Self-pay | Admitting: Family Medicine

## 2021-03-24 ENCOUNTER — Ambulatory Visit (INDEPENDENT_AMBULATORY_CARE_PROVIDER_SITE_OTHER): Payer: Medicare Other | Admitting: Family Medicine

## 2021-03-24 DIAGNOSIS — J4531 Mild persistent asthma with (acute) exacerbation: Secondary | ICD-10-CM | POA: Diagnosis not present

## 2021-03-24 MED ORDER — PREDNISONE 20 MG PO TABS: 40.0000 mg | ORAL_TABLET | Freq: Every day | ORAL | 0 refills | Status: AC

## 2021-03-24 NOTE — Progress Notes (Signed)
? ?Virtual Visit via Telephone Note ? ?I connected with Cheryl Perry on 03/24/21 at 1:42 PM by telephone and verified that I am speaking with the correct person using two identifiers. Cheryl Perry is currently located at home and nobody is currently with her during this visit. The provider, Loman Brooklyn, FNP is located in their office at time of visit. ? ?I discussed the limitations, risks, security and privacy concerns of performing an evaluation and management service by telephone and the availability of in person appointments. I also discussed with the patient that there may be a patient responsible charge related to this service. The patient expressed understanding and agreed to proceed. ? ?Subjective: ?PCP: Sharion Balloon, FNP ? ?Chief Complaint  ?Patient presents with  ? Asthma  ? ?Patient complains of cough and chest congestion. Onset of symptoms was  1.5  weeks ago. She is drinking plenty of fluids. Evaluation to date: previously seen and diagnosed with an asthma exacerbation. Treatment to date:  Prednisone taper . She has a history of asthma. She does not smoke. Patient reports she feels a little better, but not nearly enough.  ? ? ?ROS: Per HPI ? ?Current Outpatient Medications:  ?  albuterol (PROVENTIL HFA) 108 (90 Base) MCG/ACT inhaler, Inhale 2 puffs into the lungs every 6 (six) hours as needed for wheezing. May use only 3 times a month, Disp: 18 g, Rfl: 3 ?  calcium carbonate (TUMS - DOSED IN MG ELEMENTAL CALCIUM) 500 MG chewable tablet, Chew 1 tablet by mouth 3 (three) times daily as needed for indigestion or heartburn., Disp: , Rfl:  ?  Calcium Carbonate-Vitamin D 600-400 MG-UNIT tablet, Take 2 tablets by mouth daily at 12 noon. , Disp: , Rfl:  ?  Cholecalciferol (VITAMIN D) 2000 UNITS CAPS, Take 2,000 Units by mouth daily. , Disp: , Rfl:  ?  EPINEPHrine 0.3 mg/0.3 mL IJ SOAJ injection, , Disp: , Rfl:  ?  famotidine (PEPCID) 20 MG tablet, Take 20 mg by mouth 2 (two) times daily., Disp:  , Rfl:  ?  fluticasone-salmeterol (ADVAIR HFA) 115-21 MCG/ACT inhaler, 2 puffs, Disp: , Rfl:  ?  levothyroxine (SYNTHROID) 75 MCG tablet, TAKE 1 TABLET (75 MCG TOTAL)  BY MOUTH DAILY BEFORE BREAKFAST., Disp: 90 tablet, Rfl: 3 ?  loratadine (CLARITIN) 10 MG tablet, Take 10 mg by mouth daily., Disp: , Rfl:  ?  methocarbamol (ROBAXIN) 750 MG tablet, Take 375 mg by mouth every 6 (six) hours as needed for muscle spasms. , Disp: , Rfl: 2 ?  mycophenolate (CELLCEPT) 500 MG tablet, Take 1,000 mg by mouth 2 (two) times daily. Patient takes 1,000 mg in the morning and 500 mg at night., Disp: , Rfl:  ?  predniSONE (STERAPRED UNI-PAK 21 TAB) 10 MG (21) TBPK tablet, 6 tablet day 1, 5 tablet day 2, 4 tablet day 3, 3 tablet day 4,  2 tablet day five, 1 tablet day 6., Disp: 1 tablet, Rfl: 0 ?  sodium chloride (OCEAN) 0.65 % SOLN nasal spray, Place 1 spray into both nostrils as needed for congestion., Disp: 30 mL, Rfl: 2 ?  triamcinolone (NASACORT) 55 MCG/ACT AERO nasal inhaler, 1-2 sprays ea nostril, Disp: , Rfl:  ? ?Allergies  ?Allergen Reactions  ? Azathioprine Other (See Comments)  ?  Cholestatic liver injury   ? Statins Other (See Comments)  ?  Elevated LFT's  ? Sulfa Antibiotics Hives  ? Naproxen Hives and Itching  ?  Note - also has Ulcer -  cant take   ? Lipitor [Atorvastatin]   ?  mylagias  ? Premarin [Conjugated Estrogens] Itching  ?  tightness in chest  ? Ampicillin Rash  ? Avelox [Moxifloxacin Hcl In Nacl] Rash  ? Azithromycin Rash  ?  Concern for DRESS after useage  ? Bactrim [Sulfamethoxazole-Trimethoprim] Hives  ? Crestor [Rosuvastatin Calcium]   ?  Elevated liver results?  ? Doxycycline Rash  ?  Concern for DRESS after usage.  ? Levaquin [Levofloxacin In D5w] Rash  ? Livalo [Pitavastatin] Other (See Comments)  ?  myalgias  ? Macrodantin [Nitrofurantoin Macrocrystal] Hives and Rash  ? Trental [Pentoxifylline Er] Rash  ?   ?  ? ?Past Medical History:  ?Diagnosis Date  ? Allergy   ? Arthritis   ? back painherniated disc  lumbar  ? Asthma   ? Autoimmune hepatitis ([place])   ? Back pain   ? Cancer ([place]) 07/2017  ? right breast cancer, lumpectomy and 20 sessions of XRT  ? Complication of anesthesia   ? GERD (gastroesophageal reflux disease)   ? Hiatal hernia   ? Hyperlipidemia   ? Hypothyroidism   ? Osteopenia   ? PONV (postoperative nausea and vomiting)   ? asks for scop patch  ? Thyroid disease   ? Vasculopathy   ? lymphatic  ? ? ?Observations/Objective: ?A&O  ?No respiratory distress or wheezing audible over the phone ?Mood, judgement, and thought processes all WNL ? ?Assessment and Plan: ?1. Mild persistent asthma with acute exacerbation ?Extending Prednisone another three days. Use Albuterol inhaler as needed. ?- predniSONE (DELTASONE) 20 MG tablet; Take 2 tablets (40 mg total) by mouth daily with breakfast for 3 days.  Dispense: 6 tablet; Refill: 0 ? ? ?Follow Up Instructions: ? ?I discussed the assessment and treatment plan with the patient. The patient was provided an opportunity to ask questions and all were answered. The patient agreed with the plan and demonstrated an understanding of the instructions. ?  ?The patient was advised to call back or seek an in-person evaluation if the symptoms worsen or if the condition fails to improve as anticipated. ? ?The above assessment and management plan was discussed with the patient. The patient verbalized understanding of and has agreed to the management plan. Patient is aware to call the clinic if symptoms persist or worsen. Patient is aware when to return to the clinic for a follow-up visit. Patient educated on when it is appropriate to go to the emergency department.  ? ?Time call ended: 1:53 PM ? ?I provided 11 minutes of non-face-to-face time during this encounter. ? ?Hendricks Limes, MSN, APRN, FNP-C ?[place] ?03/24/21 ?

## 2021-04-06 ENCOUNTER — Ambulatory Visit (INDEPENDENT_AMBULATORY_CARE_PROVIDER_SITE_OTHER): Payer: Medicare Other

## 2021-04-06 VITALS — Wt 176.0 lb

## 2021-04-06 DIAGNOSIS — Z0000 Encounter for general adult medical examination without abnormal findings: Secondary | ICD-10-CM | POA: Diagnosis not present

## 2021-04-06 NOTE — Progress Notes (Signed)
? ?Subjective:  ? Cheryl Perry is a 76 y.o. female who presents for Medicare Annual (Subsequent) preventive examination. ? ?Virtual Visit via Telephone Note ? ?I connected with  Cheryl Perry on 04/06/21 at 12:00 PM EDT by telephone and verified that I am speaking with the correct person using two identifiers. ? ?Location: ?Patient: home ?Provider: [facility] ?Persons participating in the virtual visit: patient/Nurse Health Advisor ?  ?I discussed the limitations, risks, security and privacy concerns of performing an evaluation and management service by telephone and the availability of in person appointments. The patient expressed understanding and agreed to proceed. ? ?Interactive audio and video telecommunications were attempted between this nurse and patient, however failed, due to patient having technical difficulties OR patient did not have access to video capability.  We continued and completed visit with audio only. ? ?Some vital signs may be absent or patient reported.  ? ?Kyrah Schiro E Justino Boze, LPN ? ? ?Review of Systems    ? ?Cardiac Risk Factors include: advanced age (>58men, >52 women);sedentary lifestyle;dyslipidemia;Other (see comment), Risk factor comments: autoimmune hepatitis ? ?   ?Objective:  ?  ?Today's Vitals  ? 04/06/21 1159  ?Weight: 176 lb (79.8 kg)  ? ?Body mass index is 28.41 kg/m?. ? ? ?  04/06/2021  ? 12:16 PM 04/05/2020  ?  3:43 PM 11/18/2019  ?  8:37 AM 07/10/2019  ?  1:48 PM 12/28/2018  ?  9:39 AM 06/12/2018  ?  2:54 PM 06/12/2018  ?  2:32 PM  ?Advanced Directives  ?Does Patient Have a Medical Advance Directive? Yes Yes Yes No No  Yes  ?Type of Advance Directive Living will;Healthcare Power of [place];Living will [place];Living will    Living will;Healthcare Power of Attorney  ?Does patient want to make changes to medical advance directive?  No - Patient declined    Yes (MAU/Ambulatory/Procedural Areas - Information given)   ?Copy of [place] in Chart? No - copy requested No - copy requested No - copy requested      ? ? ?Current Medications (verified) ?Outpatient Encounter Medications as of 04/06/2021  ?Medication Sig  ? albuterol (PROVENTIL HFA) 108 (90 Base) MCG/ACT inhaler Inhale 2 puffs into the lungs every 6 (six) hours as needed for wheezing. May use only 3 times a month  ? calcium carbonate (OS-CAL - DOSED IN MG OF ELEMENTAL CALCIUM) 1250 (500 Ca) MG tablet Take 1 tablet by mouth.  ? calcium carbonate (TUMS - DOSED IN MG ELEMENTAL CALCIUM) 500 MG chewable tablet Chew 1 tablet by mouth 3 (three) times daily as needed for indigestion or heartburn.  ? Cholecalciferol (VITAMIN D) 2000 UNITS CAPS Take 2,000 Units by mouth daily.   ? famotidine (PEPCID) 20 MG tablet Take 20 mg by mouth 2 (two) times daily.  ? fluticasone-salmeterol (ADVAIR HFA) 115-21 MCG/ACT inhaler 2 puffs  ? levothyroxine (SYNTHROID) 75 MCG tablet TAKE 1 TABLET (75 MCG TOTAL)  BY MOUTH DAILY BEFORE BREAKFAST.  ? loratadine (CLARITIN) 10 MG tablet Take 10 mg by mouth daily.  ? mycophenolate (CELLCEPT) 500 MG tablet Take 1,000 mg by mouth 2 (two) times daily. Patient takes 1,000 mg in the morning and 500 mg at night.  ? triamcinolone (NASACORT) 55 MCG/ACT AERO nasal inhaler 1-2 sprays ea nostril  ? [DISCONTINUED] Calcium Carbonate-Vitamin D 600-400 MG-UNIT tablet Take 2 tablets by mouth daily at 12 noon.   ? methocarbamol (ROBAXIN) 750 MG tablet Take 375 mg by mouth  every 6 (six) hours as needed for muscle spasms.  (Patient not taking: Reported on 04/06/2021)  ? sodium chloride (OCEAN) 0.65 % SOLN nasal spray Place 1 spray into both nostrils as needed for congestion. (Patient not taking: Reported on 04/06/2021)  ? [DISCONTINUED] EPINEPHrine 0.3 mg/0.3 mL IJ SOAJ injection  (Patient not taking: Reported on 04/06/2021)  ? ?No facility-administered encounter medications on file as of 04/06/2021.  ? ? ?Allergies (verified) ?Azathioprine, Statins, Sulfa antibiotics, Naproxen,  Lipitor [atorvastatin], Premarin [conjugated estrogens], Ampicillin, Avelox [moxifloxacin hcl in nacl], Azithromycin, Bactrim [sulfamethoxazole-trimethoprim], Crestor [rosuvastatin calcium], Doxycycline, Levaquin [levofloxacin in d5w], Livalo [pitavastatin], Macrodantin [nitrofurantoin macrocrystal], and Trental [pentoxifylline er]  ? ?History: ?Past Medical History:  ?Diagnosis Date  ? Allergy   ? Arthritis   ? back painherniated disc lumbar  ? Asthma   ? Autoimmune hepatitis ([place])   ? Back pain   ? Cancer ([place]) 07/2017  ? right breast cancer, lumpectomy and 20 sessions of XRT  ? Complication of anesthesia   ? GERD (gastroesophageal reflux disease)   ? Hiatal hernia   ? Hyperlipidemia   ? Hypothyroidism   ? Osteopenia   ? PONV (postoperative nausea and vomiting)   ? asks for scop patch  ? Thyroid disease   ? Vasculopathy   ? lymphatic  ? ?Past Surgical History:  ?Procedure Laterality Date  ? BREAST LUMPECTOMY WITH RADIOACTIVE SEED AND SENTINEL LYMPH NODE BIOPSY Right 08/22/2017  ? Procedure: BREAST LUMPECTOMY WITH RADIOACTIVE SEED AND SENTINEL LYMPH NODE BIOPSY;  Surgeon: Erroll Luna, MD;  Location: [facility];  Service: General;  Laterality: Right;  ? COLONOSCOPY  2015  ? Dr. Earlean Shawl: Hemorrhoids, diverticulosis.  Next colonoscopy in 5 years due to previous history of adenomatous colon polyps  ? COLONOSCOPY N/A 11/18/2019  ? Procedure: COLONOSCOPY;  Surgeon: Daneil Dolin, MD;  [facility];  Service: Endoscopy;  Laterality: N/A;  9:30  ? CYSTOSCOPY    ? cystscopy    ? ESOPHAGOGASTRODUODENOSCOPY N/A 01/28/2015  ? Dr.Rourk- 3 small prepyloric/gastric ulcers- likely culprits causing bleeding. hiatal hernia, small gastic polyps not manipulated.  ? ESOPHAGOGASTRODUODENOSCOPY N/A 05/26/2015  ? Dr. Gala Romney: Small hiatal hernia, previous gastric ulcers completely healed.  ? KNEE SURGERY    ? LIVER BIOPSY  10/09/2018  ? TONSILLECTOMY    ? TUBAL LIGATION    ? ?Family History  ?Problem Relation Age  of Onset  ? Stroke Mother   ? Hypertension Mother   ? Alzheimer's disease Mother   ? Heart disease Sister   ? Hypertension Sister   ? Heart attack Sister   ?     pace maker   ? Arthritis Daughter   ?     very bad MVA   ? Hypertension Son   ? Seizures Son   ? Colon cancer Neg Hx   ? Liver cancer Neg Hx   ? Pancreatic disease Neg Hx   ? ?Social History  ? ?Socioeconomic History  ? Marital status: Married  ?  Spouse name: Collier Salina  ? Number of children: 2  ? Years of education: 63  ? Highest education level: Bachelor's degree (e.g., BA, AB, BS)  ?Occupational History  ? Occupation: retired   ?  Comment: HR work  / Astronomer   ?Tobacco Use  ? Smoking status: Former  ?  Types: Cigarettes  ?  Quit date: 06/06/1974  ?  Years since quitting: 46.8  ? Smokeless tobacco: Never  ? Tobacco comments:  ?  Quit x 40 years ago  ?Vaping Use  ? Vaping Use: Never used  ?Substance and Sexual Activity  ? Alcohol use: No  ?  Alcohol/week: 0.0 standard drinks  ? Drug use: No  ? Sexual activity: Not on file  ?Other Topics Concern  ? Not on file  ?Social History Narrative  ? Son in [place], granddaughter in [place]  ? Very socially active  ? ?Social Determinants of Health  ? ?Financial Resource Strain: Low Risk   ? Difficulty of Paying Living Expenses: Not hard at all  ?Food Insecurity: No Food Insecurity  ? Worried About Charity fundraiser in the Last Year: Never true  ? Ran Out of Food in the Last Year: Never true  ?Transportation Needs: No Transportation Needs  ? Lack of Transportation (Medical): No  ? Lack of Transportation (Non-Medical): No  ?Physical Activity: Insufficiently Active  ? Days of Exercise per Week: 6 days  ? Minutes of Exercise per Session: 20 min  ?Stress: No Stress Concern Present  ? Feeling of Stress : Only a little  ?Social Connections: Socially Integrated  ? Frequency of Communication with Friends and Family: More than three times a week  ? Frequency of Social Gatherings with Friends and Family: More  than three times a week  ? Attends Religious Services: More than 4 times per year  ? Active Member of Clubs or Organizations: Yes  ? Attends Archivist Meetings: More than 4 times per year  ?

## 2021-04-06 NOTE — Patient Instructions (Signed)
Cheryl Perry , ?Thank you for taking time to come for your Medicare Wellness Visit. I appreciate your ongoing commitment to your health goals. Please review the following plan we discussed and let me know if I can assist you in the future.  ? ?Screening recommendations/referrals: ?Colonoscopy: Done 11/18/2019 - Repeat in 5 years  ?Mammogram: Done 07/15/2020 - Repeat annually ?Bone Density: Done 03/08/2020 - Repeat every 2 years  ?Recommended yearly ophthalmology/optometry visit for glaucoma screening and checkup ?Recommended yearly dental visit for hygiene and checkup ? ?Vaccinations: ?Influenza vaccine: Done 09/05/2020 - Repeat annually in fall ?Pneumococcal vaccine: Done 12/09/2013, 10/27/19, & 09/28/20   ?Tdap vaccine: Done 06/09/2013 - Repeat in 10 years  ?Shingles vaccine: Done 11/08/2020 & 03/07/2021   ?Covid-19:Done 02/20/2019, 04/08/2019, 09/25/2019, 02/2020 & 10/10/20 ? ?Advanced directives: Please bring a copy of your health care power of attorney and living will to the office to be added to your chart at your convenience.  ? ?Conditions/risks identified: Aim for 30 minutes of exercise or brisk walking, 6-8 glasses of water, and 5 servings of fruits and vegetables each day.  ? ?Next appointment: Follow up in one year for your annual wellness visit  ? ? ?Preventive Care 61 Years and Older, Female ?Preventive care refers to lifestyle choices and visits with your health care provider that can promote health and wellness. ?What does preventive care include? ?A yearly physical exam. This is also called an annual well check. ?Dental exams once or twice a year. ?Routine eye exams. Ask your health care provider how often you should have your eyes checked. ?Personal lifestyle choices, including: ?Daily care of your teeth and gums. ?Regular physical activity. ?Eating a healthy diet. ?Avoiding tobacco and drug use. ?Limiting alcohol use. ?Practicing safe sex. ?Taking low-dose aspirin every day. ?Taking vitamin and mineral supplements  as recommended by your health care provider. ?What happens during an annual well check? ?The services and screenings done by your health care provider during your annual well check will depend on your age, overall health, lifestyle risk factors, and family history of disease. ?Counseling  ?Your health care provider may ask you questions about your: ?Alcohol use. ?Tobacco use. ?Drug use. ?Emotional well-being. ?Home and relationship well-being. ?Sexual activity. ?Eating habits. ?History of falls. ?Memory and ability to understand (cognition). ?Work and work Statistician. ?Reproductive health. ?Screening  ?You may have the following tests or measurements: ?Height, weight, and BMI. ?Blood pressure. ?Lipid and cholesterol levels. These may be checked every 5 years, or more frequently if you are over 28 years old. ?Skin check. ?Lung cancer screening. You may have this screening every year starting at age 60 if you have a 30-pack-year history of smoking and currently smoke or have quit within the past 15 years. ?Fecal occult blood test (FOBT) of the stool. You may have this test every year starting at age 49. ?Flexible sigmoidoscopy or colonoscopy. You may have a sigmoidoscopy every 5 years or a colonoscopy every 10 years starting at age 75. ?Hepatitis C blood test. ?Hepatitis B blood test. ?Sexually transmitted disease (STD) testing. ?Diabetes screening. This is done by checking your blood sugar (glucose) after you have not eaten for a while (fasting). You may have this done every 1-3 years. ?Bone density scan. This is done to screen for osteoporosis. You may have this done starting at age 25. ?Mammogram. This may be done every 1-2 years. Talk to your health care provider about how often you should have regular mammograms. ?Talk with your health care provider  about your test results, treatment options, and if necessary, the need for more tests. ?Vaccines  ?Your health care provider may recommend certain vaccines, such  as: ?Influenza vaccine. This is recommended every year. ?Tetanus, diphtheria, and acellular pertussis (Tdap, Td) vaccine. You may need a Td booster every 10 years. ?Zoster vaccine. You may need this after age 40. ?Pneumococcal 13-valent conjugate (PCV13) vaccine. One dose is recommended after age 52. ?Pneumococcal polysaccharide (PPSV23) vaccine. One dose is recommended after age 42. ?Talk to your health care provider about which screenings and vaccines you need and how often you need them. ?This information is not intended to replace advice given to you by your health care provider. Make sure you discuss any questions you have with your health care provider. ?Document Released: 01/21/2015 Document Revised: 09/14/2015 Document Reviewed: 10/26/2014 ?Elsevier Interactive Patient Education ? 2017 [place]. ? ?Fall Prevention in the Home ?Falls can cause injuries. They can happen to people of all ages. There are many things you can do to make your home safe and to help prevent falls. ?What can I do on the outside of my home? ?Regularly fix the edges of walkways and driveways and fix any cracks. ?Remove anything that might make you trip as you walk through a door, such as a raised step or threshold. ?Trim any bushes or trees on the path to your home. ?Use bright outdoor lighting. ?Clear any walking paths of anything that might make someone trip, such as rocks or tools. ?Regularly check to see if handrails are loose or broken. Make sure that both sides of any steps have handrails. ?Any raised decks and porches should have guardrails on the edges. ?Have any leaves, snow, or ice cleared regularly. ?Use sand or salt on walking paths during winter. ?Clean up any spills in your garage right away. This includes oil or grease spills. ?What can I do in the bathroom? ?Use night lights. ?Install grab bars by the toilet and in the tub and shower. Do not use towel bars as grab bars. ?Use non-skid mats or decals in the tub or  shower. ?If you need to sit down in the shower, use a plastic, non-slip stool. ?Keep the floor dry. Clean up any water that spills on the floor as soon as it happens. ?Remove soap buildup in the tub or shower regularly. ?Attach bath mats securely with double-sided non-slip rug tape. ?Do not have throw rugs and other things on the floor that can make you trip. ?What can I do in the bedroom? ?Use night lights. ?Make sure that you have a light by your bed that is easy to reach. ?Do not use any sheets or blankets that are too big for your bed. They should not hang down onto the floor. ?Have a firm chair that has side arms. You can use this for support while you get dressed. ?Do not have throw rugs and other things on the floor that can make you trip. ?What can I do in the kitchen? ?Clean up any spills right away. ?Avoid walking on wet floors. ?Keep items that you use a lot in easy-to-reach places. ?If you need to reach something above you, use a strong step stool that has a grab bar. ?Keep electrical cords out of the way. ?Do not use floor polish or wax that makes floors slippery. If you must use wax, use non-skid floor wax. ?Do not have throw rugs and other things on the floor that can make you trip. ?What can I do  with my stairs? ?Do not leave any items on the stairs. ?Make sure that there are handrails on both sides of the stairs and use them. Fix handrails that are broken or loose. Make sure that handrails are as long as the stairways. ?Check any carpeting to make sure that it is firmly attached to the stairs. Fix any carpet that is loose or worn. ?Avoid having throw rugs at the top or bottom of the stairs. If you do have throw rugs, attach them to the floor with carpet tape. ?Make sure that you have a light switch at the top of the stairs and the bottom of the stairs. If you do not have them, ask someone to add them for you. ?What else can I do to help prevent falls? ?Wear shoes that: ?Do not have high heels. ?Have  rubber bottoms. ?Are comfortable and fit you well. ?Are closed at the toe. Do not wear sandals. ?If you use a stepladder: ?Make sure that it is fully opened. Do not climb a closed stepladder. ?Make sure th

## 2021-04-24 DIAGNOSIS — D1801 Hemangioma of skin and subcutaneous tissue: Secondary | ICD-10-CM | POA: Diagnosis not present

## 2021-04-24 DIAGNOSIS — L813 Cafe au lait spots: Secondary | ICD-10-CM | POA: Diagnosis not present

## 2021-04-24 DIAGNOSIS — D225 Melanocytic nevi of trunk: Secondary | ICD-10-CM | POA: Diagnosis not present

## 2021-04-24 DIAGNOSIS — L814 Other melanin hyperpigmentation: Secondary | ICD-10-CM | POA: Diagnosis not present

## 2021-04-24 DIAGNOSIS — L821 Other seborrheic keratosis: Secondary | ICD-10-CM | POA: Diagnosis not present

## 2021-04-24 DIAGNOSIS — L738 Other specified follicular disorders: Secondary | ICD-10-CM | POA: Diagnosis not present

## 2021-05-01 DIAGNOSIS — C50411 Malignant neoplasm of upper-outer quadrant of right female breast: Secondary | ICD-10-CM | POA: Diagnosis not present

## 2021-05-01 DIAGNOSIS — Z170 Estrogen receptor positive status [ER+]: Secondary | ICD-10-CM | POA: Diagnosis not present

## 2021-05-04 ENCOUNTER — Other Ambulatory Visit: Payer: Self-pay | Admitting: *Deleted

## 2021-05-04 DIAGNOSIS — E038 Other specified hypothyroidism: Secondary | ICD-10-CM

## 2021-05-04 MED ORDER — LEVOTHYROXINE SODIUM 75 MCG PO TABS: ORAL_TABLET | ORAL | 0 refills | Status: DC

## 2021-05-18 DIAGNOSIS — J4540 Moderate persistent asthma, uncomplicated: Secondary | ICD-10-CM | POA: Diagnosis not present

## 2021-05-18 DIAGNOSIS — J3081 Allergic rhinitis due to animal (cat) (dog) hair and dander: Secondary | ICD-10-CM | POA: Diagnosis not present

## 2021-05-18 DIAGNOSIS — J301 Allergic rhinitis due to pollen: Secondary | ICD-10-CM | POA: Diagnosis not present

## 2021-05-18 DIAGNOSIS — J3089 Other allergic rhinitis: Secondary | ICD-10-CM | POA: Diagnosis not present

## 2021-07-03 DIAGNOSIS — M5416 Radiculopathy, lumbar region: Secondary | ICD-10-CM | POA: Diagnosis not present

## 2021-07-03 DIAGNOSIS — M5136 Other intervertebral disc degeneration, lumbar region: Secondary | ICD-10-CM | POA: Diagnosis not present

## 2021-07-19 DIAGNOSIS — K754 Autoimmune hepatitis: Secondary | ICD-10-CM | POA: Diagnosis not present

## 2021-07-19 DIAGNOSIS — D849 Immunodeficiency, unspecified: Secondary | ICD-10-CM | POA: Diagnosis not present

## 2021-07-28 DIAGNOSIS — Z1231 Encounter for screening mammogram for malignant neoplasm of breast: Secondary | ICD-10-CM | POA: Diagnosis not present

## 2021-07-28 DIAGNOSIS — Z853 Personal history of malignant neoplasm of breast: Secondary | ICD-10-CM | POA: Diagnosis not present

## 2021-08-02 ENCOUNTER — Other Ambulatory Visit: Payer: Self-pay

## 2021-08-02 DIAGNOSIS — E038 Other specified hypothyroidism: Secondary | ICD-10-CM

## 2021-08-02 MED ORDER — LEVOTHYROXINE SODIUM 75 MCG PO TABS: ORAL_TABLET | ORAL | 0 refills | Status: DC

## 2021-08-03 DIAGNOSIS — M5416 Radiculopathy, lumbar region: Secondary | ICD-10-CM | POA: Diagnosis not present

## 2021-09-04 ENCOUNTER — Ambulatory Visit (INDEPENDENT_AMBULATORY_CARE_PROVIDER_SITE_OTHER): Payer: Medicare Other | Admitting: Family

## 2021-09-04 ENCOUNTER — Encounter: Payer: Self-pay | Admitting: Family

## 2021-09-04 VITALS — BP 124/78 | HR 85 | Temp 97.4°F | Ht 66.0 in | Wt 172.2 lb

## 2021-09-04 DIAGNOSIS — E785 Hyperlipidemia, unspecified: Secondary | ICD-10-CM | POA: Diagnosis not present

## 2021-09-04 DIAGNOSIS — J4531 Mild persistent asthma with (acute) exacerbation: Secondary | ICD-10-CM

## 2021-09-04 DIAGNOSIS — K754 Autoimmune hepatitis: Secondary | ICD-10-CM

## 2021-09-04 DIAGNOSIS — Z853 Personal history of malignant neoplasm of breast: Secondary | ICD-10-CM | POA: Diagnosis not present

## 2021-09-04 DIAGNOSIS — M545 Low back pain, unspecified: Secondary | ICD-10-CM | POA: Diagnosis not present

## 2021-09-04 DIAGNOSIS — E038 Other specified hypothyroidism: Secondary | ICD-10-CM | POA: Diagnosis not present

## 2021-09-04 DIAGNOSIS — M8589 Other specified disorders of bone density and structure, multiple sites: Secondary | ICD-10-CM | POA: Diagnosis not present

## 2021-09-04 DIAGNOSIS — E663 Overweight: Secondary | ICD-10-CM

## 2021-09-04 DIAGNOSIS — K219 Gastro-esophageal reflux disease without esophagitis: Secondary | ICD-10-CM | POA: Diagnosis not present

## 2021-09-04 DIAGNOSIS — G8929 Other chronic pain: Secondary | ICD-10-CM | POA: Diagnosis not present

## 2021-09-04 NOTE — Patient Instructions (Signed)
Health Maintenance After Age 76 After age 76, you are at a higher risk for certain long-term diseases and infections as well as injuries from falls. Falls are a major cause of broken bones and head injuries in people who are older than age 76. Getting regular preventive care can help to keep you healthy and well. Preventive care includes getting regular testing and making lifestyle changes as recommended by your health care provider. Talk with your health care provider about: Which screenings and tests you should have. A screening is a test that checks for a disease when you have no symptoms. A diet and exercise plan that is right for you. What should I know about screenings and tests to prevent falls? Screening and testing are the best ways to find a health problem early. Early diagnosis and treatment give you the best chance of managing medical conditions that are common after age 76. Certain conditions and lifestyle choices may make you more likely to have a fall. Your health care provider may recommend: Regular vision checks. Poor vision and conditions such as cataracts can make you more likely to have a fall. If you wear glasses, make sure to get your prescription updated if your vision changes. Medicine review. Work with your health care provider to regularly review all of the medicines you are taking, including over-the-counter medicines. Ask your health care provider about any side effects that may make you more likely to have a fall. Tell your health care provider if any medicines that you take make you feel dizzy or sleepy. Strength and balance checks. Your health care provider may recommend certain tests to check your strength and balance while standing, walking, or changing positions. Foot health exam. Foot pain and numbness, as well as not wearing proper footwear, can make you more likely to have a fall. Screenings, including: Osteoporosis screening. Osteoporosis is a condition that causes  the bones to get weaker and break more easily. Blood pressure screening. Blood pressure changes and medicines to control blood pressure can make you feel dizzy. Depression screening. You may be more likely to have a fall if you have a fear of falling, feel depressed, or feel unable to do activities that you used to do. Alcohol use screening. Using too much alcohol can affect your balance and may make you more likely to have a fall. Follow these instructions at home: Lifestyle Do not drink alcohol if: Your health care provider tells you not to drink. If you drink alcohol: Limit how much you have to: 0-1 drink a day for women. 0-2 drinks a day for men. Know how much alcohol is in your drink. In the [place] one drink equals one 12 oz bottle of beer (355 mL), one 5 oz glass of wine (148 mL), or one 1 oz glass of hard liquor (44 mL). Do not use any products that contain nicotine or tobacco. These products include cigarettes, chewing tobacco, and vaping devices, such as e-cigarettes. If you need help quitting, ask your health care provider. Activity  Follow a regular exercise program to stay fit. This will help you maintain your balance. Ask your health care provider what types of exercise are appropriate for you. If you need a cane or walker, use it as recommended by your health care provider. Wear supportive shoes that have nonskid soles. Safety  Remove any tripping hazards, such as rugs, cords, and clutter. Install safety equipment such as grab bars in bathrooms and safety rails on stairs. Keep rooms and walkways   well-lit. General instructions Talk with your health care provider about your risks for falling. Tell your health care provider if: You fall. Be sure to tell your health care provider about all falls, even ones that seem minor. You feel dizzy, tiredness (fatigue), or off-balance. Take over-the-counter and prescription medicines only as told by your health care provider. These include  supplements. Eat a healthy diet and maintain a healthy weight. A healthy diet includes low-fat dairy products, low-fat (lean) meats, and fiber from whole grains, beans, and lots of fruits and vegetables. Stay current with your vaccines. Schedule regular health, dental, and eye exams. Summary Having a healthy lifestyle and getting preventive care can help to protect your health and wellness after age 76. Screening and testing are the best way to find a health problem early and help you avoid having a fall. Early diagnosis and treatment give you the best chance for managing medical conditions that are more common for people who are older than age 76. Falls are a major cause of broken bones and head injuries in people who are older than age 76. Take precautions to prevent a fall at home. Work with your health care provider to learn what changes you can make to improve your health and wellness and to prevent falls. This information is not intended to replace advice given to you by your health care provider. Make sure you discuss any questions you have with your health care provider. Document Revised: 05/16/2020 Document Reviewed: 05/16/2020 Elsevier Patient Education  2023 [employer]  

## 2021-09-04 NOTE — Progress Notes (Signed)
Subjective:    Patient ID: Cheryl Perry, female    DOB: 09-10-1945, 76 y.o.   MRN: [id number]  Chief Complaint  Patient presents with   Medical Management of Chronic Issues    Muscle relaxer makes dizzy she has stopped for now Dr. Nelva Bush gives.    Pt presents to the office today for chronic follow up.  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 She is currently taking Cellcept  and doing well.    Pt is followed by allergen specialists annually for her asthma. PT is followed by Ortho as needed for chronic back pain and left knee pain.   She has history of right breast cancer. She completed radiation in 10/29/17.    She has osteopenia and takes calcium and vit D. She does do 1 mile of walking 3 times a week. Asthma She complains of frequent throat clearing. There is no cough, shortness of breath or wheezing. This is a chronic problem. The current episode started more than 1 year ago. The problem occurs intermittently. The problem has been waxing and waning. Associated symptoms include heartburn. Her symptoms are alleviated by rest and beta-agonist. She reports moderate improvement on treatment. Her past medical history is significant for asthma.  Gastroesophageal Reflux She complains of belching and heartburn. She reports no coughing or no wheezing. This is a chronic problem. The current episode started more than 1 year ago. The problem occurs occasionally. Associated symptoms include fatigue. She has tried a histamine-2 antagonist for the symptoms. The treatment provided moderate relief.  Thyroid Problem Presents for follow-up visit. Symptoms include diarrhea, dry skin and fatigue. Patient reports no anxiety, constipation or depressed mood. The symptoms have been stable. Her past medical history is significant for hyperlipidemia.  Hyperlipidemia This is a chronic problem. The current episode started  more than 1 year ago. The problem is uncontrolled. Exacerbating diseases include obesity. Pertinent negatives include no shortness of breath. Current antihyperlipidemic treatment includes diet change. The current treatment provides moderate improvement of lipids. Risk factors for coronary artery disease include dyslipidemia, a sedentary lifestyle, hypertension and post-menopausal.  Back Pain This is a chronic problem. The current episode started more than 1 year ago. The problem occurs intermittently. The problem has been waxing and waning since onset. The pain is present in the lumbar spine. The pain is at a severity of 1/10. The pain is mild.      Review of Systems  Constitutional:  Positive for fatigue.  Respiratory:  Negative for cough, shortness of breath and wheezing.   Gastrointestinal:  Positive for diarrhea and heartburn. Negative for constipation.  Musculoskeletal:  Positive for back pain.  Psychiatric/Behavioral:  The patient is not nervous/anxious.   All other systems reviewed and are negative.      Objective:   Physical Exam Vitals reviewed.  Constitutional:      General: She is not in acute distress.    Appearance: She is well-developed. She is obese.  HENT:     Head: Normocephalic and atraumatic.     Right Ear: Tympanic membrane normal.     Left Ear: Tympanic membrane normal.  Eyes:     Pupils: Pupils are equal, round, and reactive to light.  Neck:     Thyroid: No thyromegaly.  Cardiovascular:     Rate and Rhythm: Normal rate and regular rhythm.     Heart sounds: Normal heart sounds. No murmur  heard. Pulmonary:     Effort: Pulmonary effort is normal. No respiratory distress.     Breath sounds: Normal breath sounds. No wheezing.  Abdominal:     General: Bowel sounds are normal. There is no distension.     Palpations: Abdomen is soft.     Tenderness: There is no abdominal tenderness.  Musculoskeletal:        General: No tenderness. Normal range of motion.      Cervical back: Normal range of motion and neck supple.  Skin:    General: Skin is warm and dry.  Neurological:     Mental Status: She is alert and oriented to person, place, and time.     Cranial Nerves: No cranial nerve deficit.     Deep Tendon Reflexes: Reflexes are normal and symmetric.  Psychiatric:        Behavior: Behavior normal.        Thought Content: Thought content normal.        Judgment: Judgment normal.       BP 124/78   Pulse 85   Temp (!) 97.4 F (36.3 C) (Temporal)   Ht 5' 6"  (1.676 m)   Wt 172 lb 3.2 oz (78.1 kg)   SpO2 95%   BMI 27.79 kg/m      Assessment & Plan:  ERILYN PEARMAN comes in today with chief complaint of Medical Management of Chronic Issues (Muscle relaxer makes dizzy she has stopped for now Dr. Nelva Bush gives. )   Diagnosis and orders addressed:  1. Mild persistent asthma with acute exacerbation - CMP14+EGFR  2. Autoimmune hepatitis ([place]) - CMP14+EGFR  3. Gastroesophageal reflux disease without esophagitis - CMP14+EGFR  4. Other specified hypothyroidism - CMP14+EGFR  5. Osteopenia of multiple sites - CMP14+EGFR  6. Chronic bilateral low back pain without sciatica - CMP14+EGFR  7. Overweight (BMI 25.0-29.9) - CMP14+EGFR  8. HX: breast cancer - CMP14+EGFR  9. Hyperlipidemia, unspecified hyperlipidemia type - CMP14+EGFR   Labs pending Health Maintenance reviewed Diet and exercise encouraged  Follow up plan: 6 months   Cheryl Dun, FNP

## 2021-09-05 LAB — CMP14+EGFR
ALT: 14 IU/L (ref 0–32)
AST: 17 IU/L (ref 0–40)
Albumin/Globulin Ratio: 2.1 (ref 1.2–2.2)
Albumin: 4.2 g/dL (ref 3.8–4.8)
Alkaline Phosphatase: 71 IU/L (ref 44–121)
BUN/Creatinine Ratio: 11 — ABNORMAL LOW (ref 12–28)
BUN: 9 mg/dL (ref 8–27)
Bilirubin Total: 0.4 mg/dL (ref 0.0–1.2)
CO2: 21 mmol/L (ref 20–29)
Calcium: 9.7 mg/dL (ref 8.7–10.3)
Chloride: 104 mmol/L (ref 96–106)
Creatinine, Ser: 0.82 mg/dL (ref 0.57–1.00)
Globulin, Total: 2 g/dL (ref 1.5–4.5)
Glucose: 91 mg/dL (ref 70–99)
Potassium: 4.1 mmol/L (ref 3.5–5.2)
Sodium: 144 mmol/L (ref 134–144)
Total Protein: 6.2 g/dL (ref 6.0–8.5)
eGFR: 74 mL/min/{1.73_m2} (ref >59)

## 2021-09-18 DIAGNOSIS — E038 Other specified hypothyroidism: Secondary | ICD-10-CM

## 2021-09-18 MED ORDER — LEVOTHYROXINE SODIUM 75 MCG PO TABS: ORAL_TABLET | ORAL | 0 refills | Status: DC

## 2021-09-20 MED ORDER — LEVOTHYROXINE SODIUM 75 MCG PO TABS: ORAL_TABLET | ORAL | 0 refills | Status: DC

## 2021-09-20 NOTE — Addendum Note (Signed)
Addended by: Everlean Cherry on: 09/20/2021 12:15 PM   Modules accepted: Orders

## 2021-10-13 DIAGNOSIS — Z23 Encounter for immunization: Secondary | ICD-10-CM | POA: Diagnosis not present

## 2021-10-15 ENCOUNTER — Other Ambulatory Visit: Payer: Self-pay | Admitting: Family

## 2021-10-15 DIAGNOSIS — E038 Other specified hypothyroidism: Secondary | ICD-10-CM

## 2022-01-17 NOTE — Progress Notes (Signed)
Office Visit Note  Patient: Cheryl Perry             Date of Birth: May 30, 1945           MRN: [id number]             PCP: Junie Spencer, FNP Referring: Junie Spencer, FNP Visit Date: 01/31/2022 Occupation: @GUAROCC @  Subjective:  Pain in right knee   History of Present Illness: Cheryl Perry is a 77 y.o. female with history of autoimmune hepatitis and osteoarthritis.  She states she was seen by her gastroenterologist yesterday.  She was advised to taper CellCept to 500 mg, 1 tablet p.o. twice daily as her LFTs have been stable.  She has been experiencing increased discomfort in her knee joints.  She has noticed some swelling in her right knee joint.  Continues to have discomfort in her hands and her lower back.  She denies any discomfort in the cervical spine today.  There is no history of oral ulcers, nasal ulcers, malar rash, photosensitivity, Raynaud's, inflammatory arthritis or lymphadenopathy.    Activities of Daily Living:  Patient reports morning stiffness for 0 minutes.   Patient Reports nocturnal pain.  Difficulty dressing/grooming: Denies Difficulty climbing stairs: Denies Difficulty getting out of chair: Reports Difficulty using hands for taps, buttons, cutlery, and/or writing: Reports  Review of Systems  Constitutional:  Positive for fatigue.  HENT:  Negative for mouth sores and mouth dryness.   Eyes:  Negative for dryness.  Respiratory:  Negative for shortness of breath.   Cardiovascular:  Negative for chest pain and palpitations.  Gastrointestinal:  Negative for blood in stool, constipation and diarrhea.  Endocrine: Negative for increased urination.  Genitourinary:  Negative for involuntary urination.  Musculoskeletal:  Positive for joint pain, joint pain, myalgias, muscle weakness and myalgias. Negative for gait problem, joint swelling, morning stiffness and muscle tenderness.  Skin:  Negative for color change, rash, hair loss and sensitivity to  sunlight.  Allergic/Immunologic: Negative for susceptible to infections.  Neurological:  Negative for dizziness and headaches.  Hematological:  Negative for swollen glands.  Psychiatric/Behavioral:  Negative for depressed mood and sleep disturbance. The patient is nervous/anxious.     PMFS History:  Patient Active Problem List   Diagnosis Date Noted   HX: breast cancer 03/07/2021   Autoimmune hepatitis (HCC) 01/12/2019   Elevated liver function tests 12/25/2017   H/O adenomatous polyp of colon 12/12/2017   Malignant neoplasm of upper-outer quadrant of right breast in female, estrogen receptor positive (HCC) 07/12/2017   Back pain 12/14/2016   Overweight (BMI 25.0-29.9) 06/14/2015   Gastric ulceration    Hiatal hernia    History of lower GI bleeding    UGI bleed 01/27/2015   GERD (gastroesophageal reflux disease) 06/15/2014   Osteopenia 09/02/2013   Hypothyroidism 06/05/2012   HLD (hyperlipidemia) 06/05/2012   Asthma 06/05/2012    Past Medical History:  Diagnosis Date   Allergy    Arthritis    back painherniated disc lumbar   Asthma    Autoimmune hepatitis (HCC)    Back pain    Cancer (HCC) 07/2017   right breast cancer, lumpectomy and 20 sessions of XRT   Complication of anesthesia    GERD (gastroesophageal reflux disease)    Hiatal hernia    Hyperlipidemia    Hypothyroidism    Osteopenia    PONV (postoperative nausea and vomiting)    asks for scop patch   Thyroid disease    Vasculopathy  lymphatic    Family History  Problem Relation Age of Onset   Stroke Mother    Hypertension Mother    Alzheimer's disease Mother    Heart disease Sister    Hypertension Sister    Heart attack Sister        pace maker    Arthritis Daughter        very bad MVA    Hypertension Son    Seizures Son    Colon cancer Neg Hx    Liver cancer Neg Hx    Pancreatic disease Neg Hx    Past Surgical History:  Procedure Laterality Date   BREAST LUMPECTOMY WITH RADIOACTIVE SEED AND  SENTINEL LYMPH NODE BIOPSY Right 08/22/2017   Procedure: BREAST LUMPECTOMY WITH RADIOACTIVE SEED AND SENTINEL LYMPH NODE BIOPSY;  Surgeon: Harriette Bouillon, MD;  Location: [facility];  Service: General;  Laterality: Right;   COLONOSCOPY  2015   Dr. Kinnie Scales: Hemorrhoids, diverticulosis.  Next colonoscopy in 5 years due to previous history of adenomatous colon polyps   COLONOSCOPY N/A 11/18/2019   Procedure: COLONOSCOPY;  Surgeon: Corbin Ade, MD;  Location: [facility];  Service: Endoscopy;  Laterality: N/A;  9:30   CYSTOSCOPY     cystscopy     ESOPHAGOGASTRODUODENOSCOPY N/A 01/28/2015   Dr.Rourk- 3 small prepyloric/gastric ulcers- likely culprits causing bleeding. hiatal hernia, small gastic polyps not manipulated.   ESOPHAGOGASTRODUODENOSCOPY N/A 05/26/2015   Dr. Jena Gauss: Small hiatal hernia, previous gastric ulcers completely healed.   KNEE SURGERY     LIVER BIOPSY  10/09/2018   TONSILLECTOMY     TUBAL LIGATION     Social History   Social History Narrative   Son in [place], granddaughter in [place]   Very socially active   Immunization History  Administered Date(s) Administered   DTaP 10/28/2014   Hepatitis A 04/03/2016   Influenza, High Dose Seasonal PF 10/28/2014, 10/11/2015, 10/10/2016, 10/14/2017, 03/20/2018, 10/17/2018, 03/23/2019, 10/27/2019, 09/05/2020   Influenza,inj,Quad PF,6+ Mos 10/18/2014   Influenza,inj,quad, With Preservative 10/09/2018   Influenza-Unspecified 12/09/2012, 11/09/2013   Moderna Covid-19 Vaccine Bivalent Booster 2yrs & up 10/10/2020   Moderna Sars-Covid-2 Vaccination 02/20/2019, 03/21/2019, 03/23/2019, 09/25/2019   PNEUMOCOCCAL CONJUGATE-20 09/29/2020   Pneumococcal Conjugate-13 12/09/2013   Pneumococcal Polysaccharide-23 05/04/2011, 10/22/2013, 10/28/2014, 03/20/2018, 10/27/2019   Pneumococcal-Unspecified 09/28/2020   Tdap 06/09/2003, 06/09/2013   Typhoid Live 04/03/2016   Zoster Recombinat (Shingrix) 11/21/2020, 03/07/2021    Zoster, Live 04/21/2010     Objective: Vital Signs: BP 131/83 (BP Location: Left Arm, Patient Position: Sitting, Cuff Size: Small)   Pulse 69   Resp 12   Ht 5\' 5"  (1.651 m)   Wt 166 lb (75.3 kg)   BMI 27.62 kg/m    Physical Exam Vitals and nursing note reviewed.  Constitutional:      Appearance: She is well-developed.  HENT:     Head: Normocephalic and atraumatic.  Eyes:     Conjunctiva/sclera: Conjunctivae normal.  Cardiovascular:     Rate and Rhythm: Normal rate and regular rhythm.     Heart sounds: Normal heart sounds.  Pulmonary:     Effort: Pulmonary effort is normal.     Breath sounds: Normal breath sounds.  Abdominal:     General: Bowel sounds are normal.     Palpations: Abdomen is soft.  Musculoskeletal:     Cervical back: Normal range of motion.  Lymphadenopathy:     Cervical: No cervical adenopathy.  Skin:    General: Skin is warm and dry.  Capillary Refill: Capillary refill takes less than 2 seconds.  Neurological:     Mental Status: She is alert and oriented to person, place, and time.  Psychiatric:        Behavior: Behavior normal.      Musculoskeletal Exam: Limited range of motion of the cervical spine.  She had thoracic kyphosis.  She had limited painful range of motion of her lumbar spine.  Shoulders, elbows, wrist joints with good range of motion.  She had bilateral PIP and DIP thickening with subluxation of several of her PIP and DIP joints.  No synovitis was noted.  Hip joints and knee joints were in good range of motion.  She had no warmth swelling or effusion in her right knee joint.  She had discomfort range of motion of her right knee joint.  There was no tenderness over ankles or MTPs.  CDAI Exam: CDAI Score: -- Patient Global: --; Provider Global: -- Swollen: --; Tender: -- Joint Exam 01/31/2022   No joint exam has been documented for this visit   There is currently no information documented on the homunculus. Go to the Rheumatology  activity and complete the homunculus joint exam.  Investigation: No additional findings.  Imaging: No results found.  Recent Labs: Lab Results  Component Value Date   WBC 5.1 03/07/2021   HGB 14.2 03/07/2021   PLT 222 03/07/2021   NA 144 09/04/2021   K 4.1 09/04/2021   CL 104 09/04/2021   CO2 21 09/04/2021   GLUCOSE 91 09/04/2021   BUN 9 09/04/2021   CREATININE 0.82 09/04/2021   BILITOT 0.4 09/04/2021   ALKPHOS 71 09/04/2021   AST 17 09/04/2021   ALT 14 09/04/2021   PROT 6.2 09/04/2021   ALBUMIN 4.2 09/04/2021   CALCIUM 9.7 09/04/2021   GFRAA 84 09/03/2019    Speciality Comments: Imuran-discontinued due to cholestatic liver injury and upper GI side effects.  Procedures:  Large Joint Inj: R knee on 01/31/2022 1:17 PM Indications: pain Details: 27 G 1.5 in needle, medial approach  Arthrogram: No  Medications: 40 mg triamcinolone acetonide 40 MG/ML; 1.5 mL lidocaine 1 % Aspirate: 0 mL Outcome: tolerated well, no immediate complications Procedure, treatment alternatives, risks and benefits explained, specific risks discussed. Consent was given by the patient. Immediately prior to procedure a time out was called to verify the correct patient, procedure, equipment, support staff and site/side marked as required. Patient was prepped and draped in the usual sterile fashion.     Allergies: Azathioprine, Statins, Sulfa antibiotics, Methocarbamol, Naproxen, Lipitor [atorvastatin], Premarin [conjugated estrogens], Ampicillin, Avelox [moxifloxacin hcl in nacl], Azithromycin, Bactrim [sulfamethoxazole-trimethoprim], Crestor [rosuvastatin calcium], Doxycycline, Levaquin [levofloxacin in d5w], Livalo [pitavastatin], Macrodantin [nitrofurantoin macrocrystal], and Trental [pentoxifylline er]   Assessment / Plan:     Visit Diagnoses: Primary osteoarthritis of both hands-she has severe osteoarthritis involving bilateral hands.  No synovitis was noted.  PIP and DIP subluxation was noted.   Joint protection muscle strengthening was discussed.  Chronic pain of right knee -she has been having pain and discomfort in her bilateral knee joints.  Her right knee joint is especially painful.  She would like to have a cortisone injection.  Indications side effects of cortisone injection were discussed at length.  Patient wants to proceed with the cortisone injection.  I reviewed her previous x-rays.  Left knee joint x-rays showed mild osteoarthritis.  I will obtain x-rays of her right knee joint.  No warmth swelling or effusion was noted.  Plan: XR KNEE 3  VIEW RIGHT.  X-rays showed mild osteoarthritis, moderate chondromalacia patella and chondrocalcinosis.  X-ray findings were discussed with the patient.  Left knee joint was injected as described above.  Patient tolerated the procedure well.  Postprocedure instructions were given.  A handout on lower extremity muscle strengthening exercises was given.  Chondrocalcinosis of right knee-findings were discussed with the patient.  Association of pseudogout with chondrocalcinosis was also discussed.  Advised patient to contact us if she has increased joint swelling.  Primary osteoarthritis of both feet-she has osteoarthritis in her bilateral feet.  Proper fitting shoes were advised.  DDD (degenerative disc disease), cervical-she has some stiffness with range of motion of the cervical spine.  She denies any radiculopathy or discomfort.  DDD (degenerative disc disease), thoracic-she had thoracic kyphosis.  She had no point tenderness.  DDD (degenerative disc disease), lumbar-she continues to have lower back pain.  Core strengthening exercises were discussed.  Osteopenia of multiple sites - DXA 03/07/20 T score -2.3 RFN.  She takes calcium and vitamin D.  Her DEXA scan is followed by her PCP.  Autoimmune hepatitis (HCC) - Bx positive on 10/02/18.  She is followed by Dr. Benard Rink at [facility].  Patient states that she was evaluated by her  gastroenterologist yesterday.  She was advised to reduce the dose of CellCept 500 mg 1 tablet p.o. twice daily.  She will be monitoring her labs closely after that.  High risk medication use - Cellcept 500 mg 2 tablets p.o. every morning and 1 tablet p.o. q. at bedtime prescribed by GI.  Positive ANA (antinuclear antibody) - AVISE index -0.4, ANA titer negative, RF 18, TPO antibody positive.  She has no other clinical features of autoimmune disease.  There is no history of oral ulcers, nasal ulcers, malar rash, photosensitivity, Raynaud's or lymphadenopathy.  Vitamin D deficiencyHer vitamin D was 51 on March 07, 2021.  Other medical problems are listed as follows:  Malignant neoplasm of upper-outer quadrant of right breast in female, estrogen receptor positive (HCC)  H/O adenomatous polyp of colon  History of hyperlipidemia  History of lower GI bleeding  History of gastroesophageal reflux (GERD)  Hiatal hernia  History of hypothyroidism  History of asthma  Orders: Orders Placed This Encounter  Procedures   Large Joint Inj   XR KNEE 3 VIEW RIGHT   No orders of the defined types were placed in this encounter.    Follow-Up Instructions: Return in about 6 months (around 08/01/2022) for Osteoarthritis.   Pollyann Savoy, MD  Note - This record has been created using Animal nutritionist.  Chart creation errors have been sought, but may not always  have been located. Such creation errors do not reflect on  the standard of medical care.

## 2022-01-22 IMAGING — DX DG KNEE 1-2V*L*
2 series · 2 of 2 positions shown · non-contrast
Comparison: None.

CLINICAL DATA: Acute left knee pain

EXAM:
LEFT KNEE - 1-2 VIEW

[knee ap]
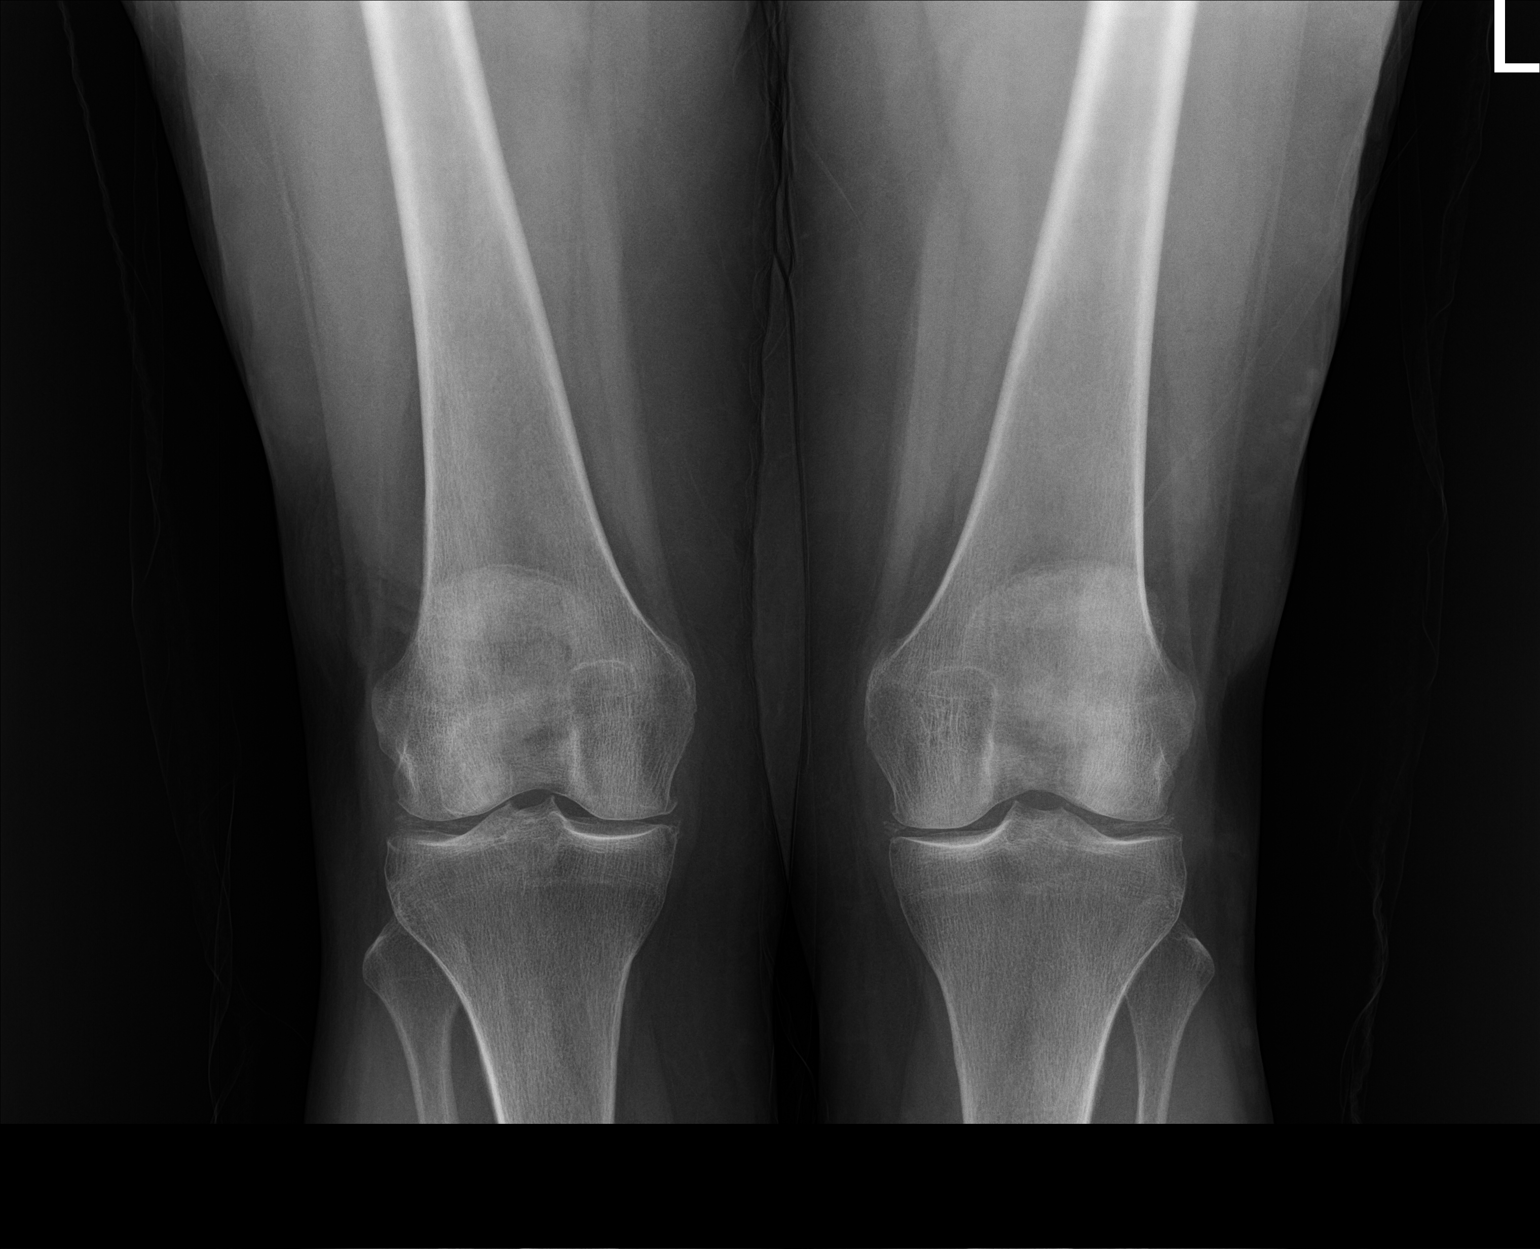

[knee lat]
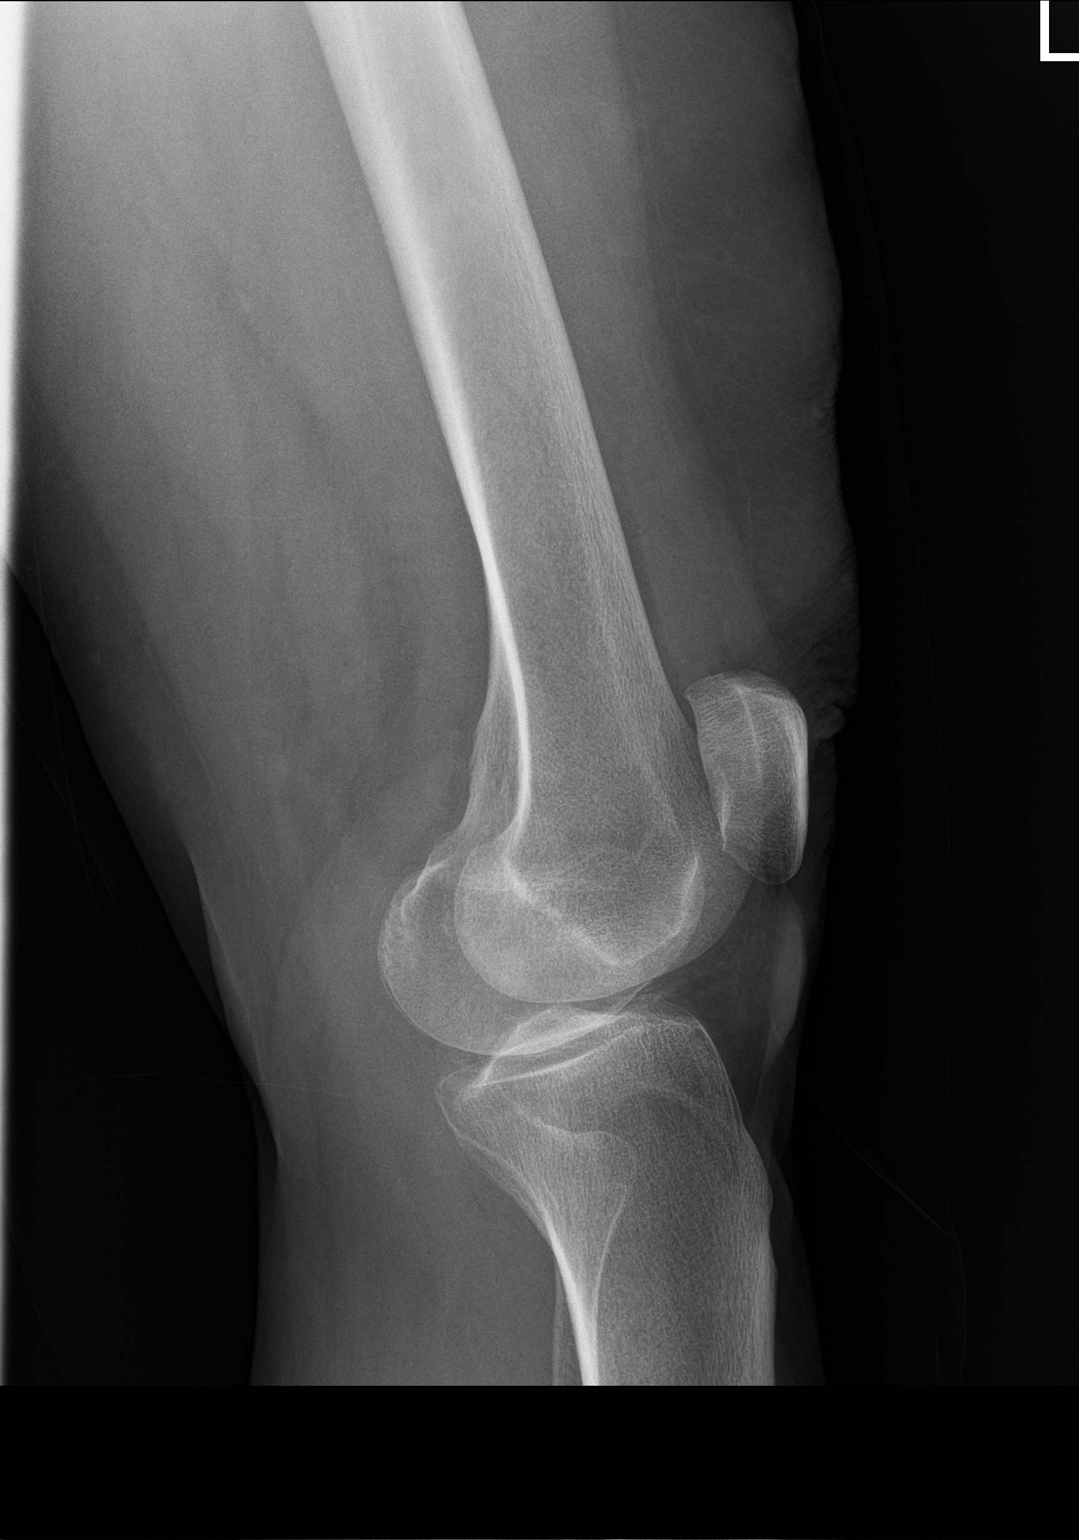

[2 of 2 positions shown; findings below may reference images not displayed]

FINDINGS: Two view radiograph left knee demonstrates normal alignment. No
fracture or dislocation. Mild tricompartmental degenerative
arthritis is present with osteophytic spurring. There is
degenerative calcification of the medial and lateral menisci. No
effusion. Soft tissues are unremarkable.

Limited evaluation of the right knee demonstrates at least
bicompartmental degenerative arthritis involving the medial and
lateral compartments with degenerative calcification of the lateral
meniscus.
IMPRESSION: Mild tricompartmental degenerative arthritis.

## 2022-01-23 DIAGNOSIS — L821 Other seborrheic keratosis: Secondary | ICD-10-CM | POA: Diagnosis not present

## 2022-01-30 DIAGNOSIS — K754 Autoimmune hepatitis: Secondary | ICD-10-CM | POA: Diagnosis not present

## 2022-01-30 DIAGNOSIS — D849 Immunodeficiency, unspecified: Secondary | ICD-10-CM | POA: Diagnosis not present

## 2022-01-31 ENCOUNTER — Ambulatory Visit: Payer: Medicare Other | Attending: Rheumatology | Admitting: Rheumatology

## 2022-01-31 ENCOUNTER — Ambulatory Visit (INDEPENDENT_AMBULATORY_CARE_PROVIDER_SITE_OTHER): Payer: Medicare Other

## 2022-01-31 ENCOUNTER — Encounter: Payer: Self-pay | Admitting: Rheumatology

## 2022-01-31 VITALS — BP 131/83 | HR 69 | Resp 12 | Ht 65.0 in | Wt 166.0 lb

## 2022-01-31 DIAGNOSIS — M5134 Other intervertebral disc degeneration, thoracic region: Secondary | ICD-10-CM | POA: Insufficient documentation

## 2022-01-31 DIAGNOSIS — M25561 Pain in right knee: Secondary | ICD-10-CM | POA: Insufficient documentation

## 2022-01-31 DIAGNOSIS — Z8639 Personal history of other endocrine, nutritional and metabolic disease: Secondary | ICD-10-CM | POA: Insufficient documentation

## 2022-01-31 DIAGNOSIS — K754 Autoimmune hepatitis: Secondary | ICD-10-CM | POA: Insufficient documentation

## 2022-01-31 DIAGNOSIS — M19041 Primary osteoarthritis, right hand: Secondary | ICD-10-CM | POA: Insufficient documentation

## 2022-01-31 DIAGNOSIS — R768 Other specified abnormal immunological findings in serum: Secondary | ICD-10-CM | POA: Diagnosis not present

## 2022-01-31 DIAGNOSIS — M19042 Primary osteoarthritis, left hand: Secondary | ICD-10-CM | POA: Diagnosis not present

## 2022-01-31 DIAGNOSIS — M19072 Primary osteoarthritis, left ankle and foot: Secondary | ICD-10-CM | POA: Insufficient documentation

## 2022-01-31 DIAGNOSIS — Z79899 Other long term (current) drug therapy: Secondary | ICD-10-CM | POA: Insufficient documentation

## 2022-01-31 DIAGNOSIS — G8929 Other chronic pain: Secondary | ICD-10-CM | POA: Insufficient documentation

## 2022-01-31 DIAGNOSIS — M5030 Other cervical disc degeneration, unspecified cervical region: Secondary | ICD-10-CM | POA: Insufficient documentation

## 2022-01-31 DIAGNOSIS — M8589 Other specified disorders of bone density and structure, multiple sites: Secondary | ICD-10-CM | POA: Insufficient documentation

## 2022-01-31 DIAGNOSIS — Z8719 Personal history of other diseases of the digestive system: Secondary | ICD-10-CM | POA: Diagnosis not present

## 2022-01-31 DIAGNOSIS — K449 Diaphragmatic hernia without obstruction or gangrene: Secondary | ICD-10-CM | POA: Insufficient documentation

## 2022-01-31 DIAGNOSIS — Z170 Estrogen receptor positive status [ER+]: Secondary | ICD-10-CM | POA: Diagnosis not present

## 2022-01-31 DIAGNOSIS — C50411 Malignant neoplasm of upper-outer quadrant of right female breast: Secondary | ICD-10-CM | POA: Diagnosis not present

## 2022-01-31 DIAGNOSIS — Z8709 Personal history of other diseases of the respiratory system: Secondary | ICD-10-CM | POA: Insufficient documentation

## 2022-01-31 DIAGNOSIS — M11261 Other chondrocalcinosis, right knee: Secondary | ICD-10-CM | POA: Insufficient documentation

## 2022-01-31 DIAGNOSIS — Z86010 Personal history of colonic polyps: Secondary | ICD-10-CM | POA: Diagnosis not present

## 2022-01-31 DIAGNOSIS — M19071 Primary osteoarthritis, right ankle and foot: Secondary | ICD-10-CM | POA: Diagnosis not present

## 2022-01-31 DIAGNOSIS — M5136 Other intervertebral disc degeneration, lumbar region: Secondary | ICD-10-CM | POA: Diagnosis not present

## 2022-01-31 DIAGNOSIS — E559 Vitamin D deficiency, unspecified: Secondary | ICD-10-CM | POA: Insufficient documentation

## 2022-01-31 MED ORDER — TRIAMCINOLONE ACETONIDE 40 MG/ML IJ SUSP
40.0000 mg | INTRAMUSCULAR | Status: AC | PRN
  Administered 2022-01-31: 40 mg via INTRA_ARTICULAR

## 2022-01-31 MED ORDER — LIDOCAINE HCL 1 % IJ SOLN
1.5000 mL | INTRAMUSCULAR | Status: AC | PRN
  Administered 2022-01-31: 1.5 mL

## 2022-01-31 NOTE — Patient Instructions (Signed)

## 2022-02-07 DIAGNOSIS — H17821 Peripheral opacity of cornea, right eye: Secondary | ICD-10-CM | POA: Diagnosis not present

## 2022-02-07 DIAGNOSIS — H04123 Dry eye syndrome of bilateral lacrimal glands: Secondary | ICD-10-CM | POA: Diagnosis not present

## 2022-02-07 DIAGNOSIS — H2513 Age-related nuclear cataract, bilateral: Secondary | ICD-10-CM | POA: Diagnosis not present

## 2022-02-07 DIAGNOSIS — H02831 Dermatochalasis of right upper eyelid: Secondary | ICD-10-CM | POA: Diagnosis not present

## 2022-02-07 DIAGNOSIS — H02834 Dermatochalasis of left upper eyelid: Secondary | ICD-10-CM | POA: Diagnosis not present

## 2022-02-15 DIAGNOSIS — K754 Autoimmune hepatitis: Secondary | ICD-10-CM | POA: Diagnosis not present

## 2022-03-08 ENCOUNTER — Ambulatory Visit (INDEPENDENT_AMBULATORY_CARE_PROVIDER_SITE_OTHER): Payer: Medicare Other | Admitting: Family

## 2022-03-08 ENCOUNTER — Encounter: Payer: Self-pay | Admitting: Family

## 2022-03-08 VITALS — BP 138/94 | HR 64 | Temp 97.2°F | Ht 65.0 in | Wt 164.8 lb

## 2022-03-08 DIAGNOSIS — G8929 Other chronic pain: Secondary | ICD-10-CM

## 2022-03-08 DIAGNOSIS — M545 Low back pain, unspecified: Secondary | ICD-10-CM | POA: Diagnosis not present

## 2022-03-08 DIAGNOSIS — K219 Gastro-esophageal reflux disease without esophagitis: Secondary | ICD-10-CM

## 2022-03-08 DIAGNOSIS — K754 Autoimmune hepatitis: Secondary | ICD-10-CM

## 2022-03-08 DIAGNOSIS — E663 Overweight: Secondary | ICD-10-CM | POA: Diagnosis not present

## 2022-03-08 DIAGNOSIS — E038 Other specified hypothyroidism: Secondary | ICD-10-CM

## 2022-03-08 DIAGNOSIS — R7989 Other specified abnormal findings of blood chemistry: Secondary | ICD-10-CM

## 2022-03-08 DIAGNOSIS — E785 Hyperlipidemia, unspecified: Secondary | ICD-10-CM | POA: Diagnosis not present

## 2022-03-08 DIAGNOSIS — M8589 Other specified disorders of bone density and structure, multiple sites: Secondary | ICD-10-CM

## 2022-03-08 DIAGNOSIS — Z853 Personal history of malignant neoplasm of breast: Secondary | ICD-10-CM | POA: Diagnosis not present

## 2022-03-08 DIAGNOSIS — J4531 Mild persistent asthma with (acute) exacerbation: Secondary | ICD-10-CM

## 2022-03-08 NOTE — Progress Notes (Signed)
Subjective:    Patient ID: Cheryl Perry, female    DOB: July 20, 1945, 77 y.o.   MRN: [id number]  Chief Complaint  Patient presents with   Medical Management of Chronic Issues    Pt presents to the office today for chronic follow up.  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 She is currently taking Cellcept  and doing well.    Pt is followed by allergen specialists annually for her asthma. PT is followed by Ortho as needed for chronic back pain and left knee pain.   She has history of right breast cancer. She completed radiation in 10/29/17.    She has osteopenia and takes calcium and vit D. She does do 1 mile of walking 3 times a week. Asthma There is no cough or wheezing. This is a chronic problem. The current episode started more than 1 year ago. The problem occurs intermittently. The problem has been waxing and waning. Associated symptoms include heartburn. She reports minimal improvement on treatment. Her past medical history is significant for asthma.  Gastroesophageal Reflux She complains of belching and heartburn. She reports no coughing or no wheezing. This is a chronic problem. The current episode started more than 1 year ago. The problem occurs rarely. Associated symptoms include fatigue. She has tried a histamine-2 antagonist for the symptoms. The treatment provided mild relief.  Thyroid Problem Presents for follow-up visit. Symptoms include fatigue. Patient reports no anxiety or constipation. The symptoms have been stable. Her past medical history is significant for hyperlipidemia.  Hyperlipidemia This is a chronic problem. The current episode started more than 1 year ago. The problem is uncontrolled. Exacerbating diseases include obesity. Current antihyperlipidemic treatment includes diet change. The current treatment provides mild improvement of lipids. Risk factors for coronary artery  disease include dyslipidemia, a sedentary lifestyle and post-menopausal.  Back Pain This is a chronic problem. The current episode started more than 1 year ago. The problem occurs intermittently. The problem has been waxing and waning since onset. The pain is present in the lumbar spine. The quality of the pain is described as aching. The pain is at a severity of 4/10. The pain is moderate. Risk factors include obesity. The treatment provided mild relief.      Review of Systems  Constitutional:  Positive for fatigue.  Respiratory:  Negative for cough and wheezing.   Gastrointestinal:  Positive for heartburn. Negative for constipation.  Musculoskeletal:  Positive for back pain.  Psychiatric/Behavioral:  The patient is not nervous/anxious.   All other systems reviewed and are negative.      Objective:   Physical Exam Vitals reviewed.  Constitutional:      General: She is not in acute distress.    Appearance: She is well-developed. She is obese.  HENT:     Head: Normocephalic and atraumatic.     Right Ear: Tympanic membrane normal.     Left Ear: Tympanic membrane normal.  Eyes:     Pupils: Pupils are equal, round, and reactive to light.  Neck:     Thyroid: No thyromegaly.  Cardiovascular:     Rate and Rhythm: Normal rate and regular rhythm.     Heart sounds: Normal heart sounds. No murmur heard. Pulmonary:     Effort: Pulmonary effort is normal. No respiratory distress.     Breath sounds: Normal breath sounds. No wheezing.  Abdominal:     General: Bowel  sounds are normal. There is no distension.     Palpations: Abdomen is soft.     Tenderness: There is no abdominal tenderness.  Musculoskeletal:        General: No tenderness. Normal range of motion.     Cervical back: Normal range of motion and neck supple.  Skin:    General: Skin is warm and dry.  Neurological:     Mental Status: She is alert and oriented to person, place, and time.     Cranial Nerves: No cranial nerve  deficit.     Deep Tendon Reflexes: Reflexes are normal and symmetric.  Psychiatric:        Behavior: Behavior normal.        Thought Content: Thought content normal.        Judgment: Judgment normal.     BP (!) 138/94   Pulse 64   Temp (!) 97.2 F (36.2 C) (Temporal)   Ht '5\' 5"'$  (1.651 m)   Wt 164 lb 12.8 oz (74.8 kg)   SpO2 98%   BMI 27.42 kg/m       Assessment & Plan:  ERSULA COMBEST comes in today with chief complaint of Medical Management of Chronic Issues   Diagnosis and orders addressed:  1. Other specified hypothyroidism - CMP14+EGFR - CBC with Differential/Platelet - TSH  2. Hyperlipidemia, unspecified hyperlipidemia type - CMP14+EGFR - CBC with Differential/Platelet  3. Mild persistent asthma with acute exacerbation - CMP14+EGFR - CBC with Differential/Platelet  4. Osteopenia of multiple sites - CMP14+EGFR - CBC with Differential/Platelet  5. Gastroesophageal reflux disease without esophagitis - CMP14+EGFR - CBC with Differential/Platelet  6. Overweight (BMI 25.0-29.9) - CMP14+EGFR - CBC with Differential/Platelet  7. Chronic bilateral low back pain without sciatica - CMP14+EGFR - CBC with Differential/Platelet  8. Elevated liver function tests  - CMP14+EGFR - CBC with Differential/Platelet  9. Autoimmune hepatitis ([place])  - CMP14+EGFR - CBC with Differential/Platelet  10. HX: breast cancer  - CMP14+EGFR - CBC with Differential/Platelet   Labs pending Health Maintenance reviewed Diet and exercise encouraged  Follow up plan: 6 months  [name], FNP

## 2022-03-08 NOTE — Patient Instructions (Signed)
Health Maintenance After Age 77 After age 77, you are at a higher risk for certain long-term diseases and infections as well as injuries from falls. Falls are a major cause of broken bones and head injuries in people who are older than age 77. Getting regular preventive care can help to keep you healthy and well. Preventive care includes getting regular testing and making lifestyle changes as recommended by your health care provider. Talk with your health care provider about: Which screenings and tests you should have. A screening is a test that checks for a disease when you have no symptoms. A diet and exercise plan that is right for you. What should I know about screenings and tests to prevent falls? Screening and testing are the best ways to find a health problem early. Early diagnosis and treatment give you the best chance of managing medical conditions that are common after age 77. Certain conditions and lifestyle choices may make you more likely to have a fall. Your health care provider may recommend: Regular vision checks. Poor vision and conditions such as cataracts can make you more likely to have a fall. If you wear glasses, make sure to get your prescription updated if your vision changes. Medicine review. Work with your health care provider to regularly review all of the medicines you are taking, including over-the-counter medicines. Ask your health care provider about any side effects that may make you more likely to have a fall. Tell your health care provider if any medicines that you take make you feel dizzy or sleepy. Strength and balance checks. Your health care provider may recommend certain tests to check your strength and balance while standing, walking, or changing positions. Foot health exam. Foot pain and numbness, as well as not wearing proper footwear, can make you more likely to have a fall. Screenings, including: Osteoporosis screening. Osteoporosis is a condition that causes  the bones to get weaker and break more easily. Blood pressure screening. Blood pressure changes and medicines to control blood pressure can make you feel dizzy. Depression screening. You may be more likely to have a fall if you have a fear of falling, feel depressed, or feel unable to do activities that you used to do. Alcohol use screening. Using too much alcohol can affect your balance and may make you more likely to have a fall. Follow these instructions at home: Lifestyle Do not drink alcohol if: Your health care provider tells you not to drink. If you drink alcohol: Limit how much you have to: 0-1 drink a day for women. 0-2 drinks a day for men. Know how much alcohol is in your drink. In the [place] one drink equals one 12 oz bottle of beer (355 mL), one 5 oz glass of wine (148 mL), or one 1 oz glass of hard liquor (44 mL). Do not use any products that contain nicotine or tobacco. These products include cigarettes, chewing tobacco, and vaping devices, such as e-cigarettes. If you need help quitting, ask your health care provider. Activity  Follow a regular exercise program to stay fit. This will help you maintain your balance. Ask your health care provider what types of exercise are appropriate for you. If you need a cane or walker, use it as recommended by your health care provider. Wear supportive shoes that have nonskid soles. Safety  Remove any tripping hazards, such as rugs, cords, and clutter. Install safety equipment such as grab bars in bathrooms and safety rails on stairs. Keep rooms and walkways   well-lit. General instructions Talk with your health care provider about your risks for falling. Tell your health care provider if: You fall. Be sure to tell your health care provider about all falls, even ones that seem minor. You feel dizzy, tiredness (fatigue), or off-balance. Take over-the-counter and prescription medicines only as told by your health care provider. These include  supplements. Eat a healthy diet and maintain a healthy weight. A healthy diet includes low-fat dairy products, low-fat (lean) meats, and fiber from whole grains, beans, and lots of fruits and vegetables. Stay current with your vaccines. Schedule regular health, dental, and eye exams. Summary Having a healthy lifestyle and getting preventive care can help to protect your health and wellness after age 77. Screening and testing are the best way to find a health problem early and help you avoid having a fall. Early diagnosis and treatment give you the best chance for managing medical conditions that are more common for people who are older than age 77. Falls are a major cause of broken bones and head injuries in people who are older than age 77. Take precautions to prevent a fall at home. Work with your health care provider to learn what changes you can make to improve your health and wellness and to prevent falls. This information is not intended to replace advice given to you by your health care provider. Make sure you discuss any questions you have with your health care provider. Document Revised: 05/16/2020 Document Reviewed: 05/16/2020 Elsevier Patient Education  2023 [employer]  

## 2022-03-09 LAB — CMP14+EGFR
ALT: 11 IU/L (ref 0–32)
AST: 19 IU/L (ref 0–40)
Albumin/Globulin Ratio: 2.3 — ABNORMAL HIGH (ref 1.2–2.2)
Albumin: 4.2 g/dL (ref 3.8–4.8)
Alkaline Phosphatase: 76 IU/L (ref 44–121)
BUN/Creatinine Ratio: 10 — ABNORMAL LOW (ref 12–28)
BUN: 9 mg/dL (ref 8–27)
Bilirubin Total: 0.5 mg/dL (ref 0.0–1.2)
CO2: 21 mmol/L (ref 20–29)
Calcium: 9.7 mg/dL (ref 8.7–10.3)
Chloride: 105 mmol/L (ref 96–106)
Creatinine, Ser: 0.89 mg/dL (ref 0.57–1.00)
Globulin, Total: 1.8 g/dL (ref 1.5–4.5)
Glucose: 85 mg/dL (ref 70–99)
Potassium: 4.5 mmol/L (ref 3.5–5.2)
Sodium: 143 mmol/L (ref 134–144)
Total Protein: 6 g/dL (ref 6.0–8.5)
eGFR: 67 mL/min/{1.73_m2} (ref >59)

## 2022-03-09 LAB — TSH: TSH: 2.6 u[IU]/mL (ref 0.450–4.500)

## 2022-03-09 LAB — CBC WITH DIFFERENTIAL/PLATELET
Basophils Absolute: 0.1 10*3/uL (ref 0.0–0.2)
Basos: 1 %
EOS (ABSOLUTE): 0.1 10*3/uL (ref 0.0–0.4)
Eos: 2 %
Hematocrit: 43.3 % (ref 34.0–46.6)
Hemoglobin: 14.2 g/dL (ref 11.1–15.9)
Immature Grans (Abs): 0 10*3/uL (ref 0.0–0.1)
Immature Granulocytes: 1 %
Lymphocytes Absolute: 0.9 10*3/uL (ref 0.7–3.1)
Lymphs: 17 %
MCH: 30.8 pg (ref 26.6–33.0)
MCHC: 32.8 g/dL (ref 31.5–35.7)
MCV: 94 fL (ref 79–97)
Monocytes Absolute: 0.5 10*3/uL (ref 0.1–0.9)
Monocytes: 10 %
Neutrophils Absolute: 3.7 10*3/uL (ref 1.4–7.0)
Neutrophils: 69 %
Platelets: 241 10*3/uL (ref 150–450)
RBC: 4.61 x10E6/uL (ref 3.77–5.28)
RDW: 12.4 % (ref 11.7–15.4)
WBC: 5.3 10*3/uL (ref 3.4–10.8)

## 2022-03-12 ENCOUNTER — Other Ambulatory Visit (HOSPITAL_COMMUNITY): Payer: Self-pay

## 2022-03-12 ENCOUNTER — Other Ambulatory Visit: Payer: Self-pay | Admitting: Family

## 2022-03-12 DIAGNOSIS — E038 Other specified hypothyroidism: Secondary | ICD-10-CM

## 2022-03-12 MED ORDER — LEVOTHYROXINE SODIUM 75 MCG PO TABS
75.0000 ug | ORAL_TABLET | Freq: Every day | ORAL | 2 refills | Status: DC
  Filled 2022-03-12: qty 30, 30d supply, fill #0

## 2022-03-12 NOTE — Telephone Encounter (Signed)
From: [name] To: Office of [place], [place] Sent: 03/11/2022 3:56 PM EST Subject: Medication Renewal Request  Refills have been requested for the following medications:   levothyroxine (SYNTHROID) 75 MCG tablet [Zeric Baranowski]  Patient Comment: Needs 3 refills also: use [facility]  Preferred pharmacy: [facility] Delivery method: Mail

## 2022-03-13 ENCOUNTER — Other Ambulatory Visit: Payer: Self-pay

## 2022-03-15 ENCOUNTER — Encounter (HOSPITAL_COMMUNITY): Payer: Self-pay

## 2022-03-15 ENCOUNTER — Other Ambulatory Visit (HOSPITAL_COMMUNITY): Payer: Self-pay

## 2022-03-15 DIAGNOSIS — H2511 Age-related nuclear cataract, right eye: Secondary | ICD-10-CM | POA: Diagnosis not present

## 2022-03-16 ENCOUNTER — Other Ambulatory Visit: Payer: Self-pay

## 2022-03-19 DIAGNOSIS — K754 Autoimmune hepatitis: Secondary | ICD-10-CM | POA: Diagnosis not present

## 2022-03-30 DIAGNOSIS — H2512 Age-related nuclear cataract, left eye: Secondary | ICD-10-CM | POA: Diagnosis not present

## 2022-04-10 ENCOUNTER — Ambulatory Visit (INDEPENDENT_AMBULATORY_CARE_PROVIDER_SITE_OTHER): Payer: Medicare Other

## 2022-04-10 ENCOUNTER — Other Ambulatory Visit (HOSPITAL_COMMUNITY): Payer: Self-pay

## 2022-04-10 ENCOUNTER — Telehealth: Payer: Self-pay | Admitting: Family

## 2022-04-10 VITALS — Ht 65.0 in | Wt 162.0 lb

## 2022-04-10 DIAGNOSIS — E038 Other specified hypothyroidism: Secondary | ICD-10-CM

## 2022-04-10 DIAGNOSIS — Z0000 Encounter for general adult medical examination without abnormal findings: Secondary | ICD-10-CM

## 2022-04-10 DIAGNOSIS — Z780 Asymptomatic menopausal state: Secondary | ICD-10-CM

## 2022-04-10 MED ORDER — LEVOTHYROXINE SODIUM 75 MCG PO TABS: 75.0000 ug | ORAL_TABLET | Freq: Every day | ORAL | 1 refills | Status: DC

## 2022-04-10 MED ORDER — LEVOTHYROXINE SODIUM 75 MCG PO TABS: 75.0000 ug | ORAL_TABLET | Freq: Every day | ORAL | 0 refills | Status: DC

## 2022-04-10 NOTE — Telephone Encounter (Signed)
Pt needs her Levothyroxine Rx voided at [facility] and wants 1 refill sent to [facility] in [place] and the remaining refills sent to [facility] mail order.

## 2022-04-10 NOTE — Progress Notes (Signed)
Subjective:   Cheryl Perry is a 77 y.o. female who presents for Medicare Annual (Subsequent) preventive examination. I connected with  [name] on 04/10/22 by a audio enabled telemedicine application and verified that I am speaking with the correct person using two identifiers.  Patient Location: Home  Provider Location: Home Office  I discussed the limitations of evaluation and management by telemedicine. The patient expressed understanding and agreed to proceed.  Review of Systems     Cardiac Risk Factors include: advanced age (>93men, >61 women)     Objective:    Today's Vitals   04/10/22 1035  Weight: 162 lb (73.5 kg)  Height: 5\' 5"  (1.651 m)   Body mass index is 26.96 kg/m.     04/10/2022   10:43 AM 04/06/2021   12:16 PM 04/05/2020    3:43 PM 11/18/2019    8:37 AM 07/10/2019    1:48 PM 12/28/2018    9:39 AM 06/12/2018    2:54 PM  Advanced Directives  Does Patient Have a Medical Advance Directive? Yes Yes Yes Yes No No   Type of Paramedic of [place];Living will Living will;Healthcare Power of [place];Living will [place];Living will     Does patient want to make changes to medical advance directive?   No - Patient declined    Yes (MAU/Ambulatory/Procedural Areas - Information given)  Copy of [place] in Chart? No - copy requested No - copy requested No - copy requested No - copy requested       Current Medications (verified) Outpatient Encounter Medications as of 04/10/2022  Medication Sig   albuterol (PROVENTIL HFA) 108 (90 Base) MCG/ACT inhaler Inhale 2 puffs into the lungs every 6 (six) hours as needed for wheezing. May use only 3 times a month   calcium carbonate (OS-CAL - DOSED IN MG OF ELEMENTAL CALCIUM) 1250 (500 Ca) MG tablet Take 1 tablet by mouth.   calcium carbonate (TUMS - DOSED IN MG ELEMENTAL CALCIUM) 500 MG chewable tablet Chew 1 tablet by mouth 3  (three) times daily as needed for indigestion or heartburn.   Cholecalciferol (VITAMIN D) 2000 UNITS CAPS Take 2,000 Units by mouth daily.    famotidine (PEPCID) 20 MG tablet Take 20 mg by mouth 2 (two) times daily.   fluticasone-salmeterol (ADVAIR HFA) 115-21 MCG/ACT inhaler 2 puffs   levothyroxine (SYNTHROID) 75 MCG tablet Take 1 tablet (75 mcg total) by mouth daily before breakfast.   loratadine (CLARITIN) 10 MG tablet Take 10 mg by mouth daily.   mycophenolate (CELLCEPT) 500 MG tablet Take 500 mg by mouth 2 (two) times daily. Patient takes 500 mg in the morning and 500 mg at night.   sodium chloride (OCEAN) 0.65 % SOLN nasal spray Place 1 spray into both nostrils as needed for congestion.   Spacer/Aero-Holding Chambers (AEROCHAMBER HOLDING CHAMBER) DEVI as directed inhalation as directed   triamcinolone (NASACORT) 55 MCG/ACT AERO nasal inhaler 1-2 sprays ea nostril   No facility-administered encounter medications on file as of 04/10/2022.    Allergies (verified) Azathioprine, Statins, Sulfa antibiotics, Methocarbamol, Naproxen, Lipitor [atorvastatin], Premarin [conjugated estrogens], Ampicillin, Avelox [moxifloxacin hcl in nacl], Azithromycin, Bactrim [sulfamethoxazole-trimethoprim], Crestor [rosuvastatin calcium], Doxycycline, Levaquin [levofloxacin in d5w], Livalo [pitavastatin], Macrodantin [nitrofurantoin macrocrystal], and Trental [pentoxifylline er]   History: Past Medical History:  Diagnosis Date   Allergy    Arthritis    back painherniated disc lumbar   Asthma    Autoimmune hepatitis  Back pain    Cancer 07/2017   right breast cancer, lumpectomy and 20 sessions of XRT   Complication of anesthesia    GERD (gastroesophageal reflux disease)    Hiatal hernia    Hyperlipidemia    Hypothyroidism    Osteopenia    PONV (postoperative nausea and vomiting)    asks for scop patch   Thyroid disease    Vasculopathy    lymphatic   Past Surgical History:  Procedure Laterality  Date   BREAST LUMPECTOMY WITH RADIOACTIVE SEED AND SENTINEL LYMPH NODE BIOPSY Right 08/22/2017   Procedure: BREAST LUMPECTOMY WITH RADIOACTIVE SEED AND SENTINEL LYMPH NODE BIOPSY;  Surgeon: Erroll Luna, MD;  Location: [facility];  Service: General;  Laterality: Right;   COLONOSCOPY  2015   Dr. Earlean Shawl: Hemorrhoids, diverticulosis.  Next colonoscopy in 5 years due to previous history of adenomatous colon polyps   COLONOSCOPY N/A 11/18/2019   Procedure: COLONOSCOPY;  Surgeon: Daneil Dolin, MD;  [facility];  Service: Endoscopy;  Laterality: N/A;  9:30   CYSTOSCOPY     cystscopy     ESOPHAGOGASTRODUODENOSCOPY N/A 01/28/2015   Dr.Rourk- 3 small prepyloric/gastric ulcers- likely culprits causing bleeding. hiatal hernia, small gastic polyps not manipulated.   ESOPHAGOGASTRODUODENOSCOPY N/A 05/26/2015   Dr. Gala Romney: Small hiatal hernia, previous gastric ulcers completely healed.   KNEE SURGERY     LIVER BIOPSY  10/09/2018   TONSILLECTOMY     TUBAL LIGATION     Family History  Problem Relation Age of Onset   Stroke Mother    Hypertension Mother    Alzheimer's disease Mother    Heart disease Sister    Hypertension Sister    Heart attack Sister        pace maker    Arthritis Daughter        very bad MVA    Hypertension Son    Seizures Son    Colon cancer Neg Hx    Liver cancer Neg Hx    Pancreatic disease Neg Hx    Social History   Socioeconomic History   Marital status: Married    Spouse name: Collier Salina   Number of children: 2   Years of education: 16   Highest education level: Bachelor's degree (e.g., BA, AB, BS)  Occupational History   Occupation: retired     Comment: HR work  / Astronomer   Tobacco Use   Smoking status: Former    Types: Cigarettes    Quit date: 06/06/1974    Years since quitting: 47.8    Passive exposure: Never   Smokeless tobacco: Never   Tobacco comments:    Quit x 40 years ago  Vaping Use   Vaping Use: Never  used  Substance and Sexual Activity   Alcohol use: No    Alcohol/week: 0.0 standard drinks of alcohol   Drug use: No   Sexual activity: Not on file  Other Topics Concern   Not on file  Social History Narrative   Son in [place], granddaughter in [place]   Very socially active   Social Determinants of Health   Financial Resource Strain: Low Risk  (04/10/2022)   Overall Financial Resource Strain (CARDIA)    Difficulty of Paying Living Expenses: Not hard at all  Food Insecurity: No Food Insecurity (04/10/2022)   Hunger Vital Sign    Worried About Running Out of Food in the Last Year: Never true    [place] in the  Last Year: Never true  Transportation Needs: No Transportation Needs (04/10/2022)   PRAPARE - Hydrologist (Medical): No    Lack of Transportation (Non-Medical): No  Physical Activity: Inactive (04/10/2022)   Exercise Vital Sign    Days of Exercise per Week: 0 days    Minutes of Exercise per Session: 0 min  Stress: No Stress Concern Present (04/10/2022)   [place]    Feeling of Stress : Not at all  Social Connections: Moderately Isolated (04/10/2022)   Social Connection and Isolation Panel [NHANES]    Frequency of Communication with Friends and Family: More than three times a week    Frequency of Social Gatherings with Friends and Family: More than three times a week    Attends Religious Services: Never    Marine scientist or Organizations: No    Attends Music therapist: Never    Marital Status: Married    Tobacco Counseling Counseling given: Not Answered Tobacco comments: Quit x 40 years ago   Clinical Intake:  Pre-visit preparation completed: Yes  Pain : No/denies pain     Nutritional Risks: None Diabetes: No  How often do you need to have someone help you when you read instructions, pamphlets, or other written materials from your doctor or  pharmacy?: 1 - Never  Diabetic?no   Interpreter Needed?: No  Information entered by :: Jadene Pierini, LPN   Activities of Daily Living    04/10/2022   10:44 AM  In your present state of health, do you have any difficulty performing the following activities:  Hearing? 0  Vision? 0  Difficulty concentrating or making decisions? 0  Walking or climbing stairs? 0  Dressing or bathing? 0  Doing errands, shopping? 0  Preparing Food and eating ? N  Using the Toilet? N  In the past six months, have you accidently leaked urine? N  Do you have problems with loss of bowel control? N  Managing your Medications? N  Managing your Finances? N  Housekeeping or managing your Housekeeping? N    Patient Care Team: Sharion Balloon, FNP as PCP - General (Family Medicine) Clent Jacks, MD as Consulting Physician (Ophthalmology) Gala Romney Cristopher Estimable, MD as Consulting Physician (Gastroenterology) Suella Broad, MD as Consulting Physician (Physical Medicine and Rehabilitation) Iran Planas, MD as Consulting Physician (Orthopedic Surgery) Richmond Campbell, MD as Consulting Physician (Gastroenterology) Nicholas Lose, MD as Consulting Physician (Hematology and Oncology) Erroll Luna, MD as Consulting Physician (General Surgery) Kyung Rudd, MD as Consulting Physician (Radiation Oncology) Delice Bison Charlestine Massed, NP as Nurse Practitioner (Hematology and Oncology)  Indicate any recent Medical Services you may have received from other than [facility] providers in the past year (date may be approximate).     Assessment:   This is a routine wellness examination for [place].  Hearing/Vision screen Vision Screening - Comments:: Wears rx glasses - up to date with routine eye exams with  Dr.Groat   Dietary issues and exercise activities discussed: Current Exercise Habits: Home exercise routine, Type of exercise: strength training/weights, Time (Minutes): 30, Frequency (Times/Week): 3, Weekly Exercise  (Minutes/Week): 90, Intensity: Mild, Exercise limited by: None identified   Goals Addressed             This Visit's Progress    Patient Stated   On track    04/06/2021 AWV Goal: Exercise for General Health  Patient will verbalize understanding of the benefits of increased physical activity:  Exercising regularly is important. It will improve your overall fitness, flexibility, and endurance. Regular exercise also will improve your overall health. It can help you control your weight, reduce stress, and improve your bone density. Over the next year, patient will increase physical activity as tolerated with a goal of at least 150 minutes of moderate physical activity per week.  You can tell that you are exercising at a moderate intensity if your heart starts beating faster and you start breathing faster but can still hold a conversation. Moderate-intensity exercise ideas include: Walking 1 mile (1.6 km) in about 15 minutes Biking Hiking Golfing Dancing Water aerobics Patient will verbalize understanding of everyday activities that increase physical activity by providing examples like the following: Yard work, such as: Sales promotion account executive Gardening Washing windows or floors Patient will be able to explain general safety guidelines for exercising:  Before you start a new exercise program, talk with your health care provider. Do not exercise so much that you hurt yourself, feel dizzy, or get very short of breath. Wear comfortable clothes and wear shoes with good support. Drink plenty of water while you exercise to prevent dehydration or heat stroke. Work out until your breathing and your heartbeat get faster.        Depression Screen    04/10/2022   10:42 AM 03/08/2022    8:42 AM 04/06/2021   12:07 PM 03/17/2021   12:29 PM 03/07/2021    9:12 AM 03/07/2021    8:20 AM 03/07/2020    8:03 AM  PHQ 2/9  Scores  PHQ - 2 Score 0 1 1 1  0 0 0  PHQ- 9 Score 0 3 3 5 2       Fall Risk    04/10/2022   10:37 AM 03/08/2022    8:06 AM 04/06/2021   12:01 PM 03/17/2021   12:30 PM 03/07/2021    8:20 AM  Fall Risk   Falls in the past year? 0 0 0 0 0  Number falls in past yr: 0  0    Injury with Fall? 0  0    Risk for fall due to : No Fall Risks  Orthopedic patient    Follow up Falls prevention discussed  Falls prevention discussed      [place]:  Any stairs in or around the home? Yes  If so, are there any without handrails? No  Home free of loose throw rugs in walkways, pet beds, electrical cords, etc? Yes  Adequate lighting in your home to reduce risk of falls? Yes   ASSISTIVE DEVICES UTILIZED TO PREVENT FALLS:  Life alert? No  Use of a cane, walker or w/c? No  Grab bars in the bathroom? Yes  Shower chair or bench in shower? Yes  Elevated toilet seat or a handicapped toilet? Yes       01/16/2017   10:25 AM 10/18/2014   11:08 AM  MMSE - Mini Mental State Exam  Orientation to time 5 5  Orientation to Place 5 5  Registration 3 3  Attention/ Calculation 5 5  Recall 3 3  Language- name 2 objects 2 2  Language- repeat 1 1  Language- follow 3 step command 3 3  Language- read & follow direction 1 1  Write a sentence 1 1  Copy design 1 1  Total score 30 30        04/10/2022  10:44 AM 04/06/2021   12:16 PM 04/05/2020    3:46 PM 06/12/2018    2:40 PM  6CIT Screen  What Year? 0 points 0 points 0 points 0 points  What month? 0 points 0 points 0 points 0 points  What time? 0 points 0 points 0 points 0 points  Count back from 20 0 points 0 points 0 points 0 points  Months in reverse 0 points 0 points 0 points 0 points  Repeat phrase 0 points 0 points 0 points 0 points  Total Score 0 points 0 points 0 points 0 points    Immunizations Immunization History  Administered Date(s) Administered   Covid-19, Mrna,Vaccine(Spikevax)75yrs and older 12/01/2021    DTaP 10/28/2014   Fluad Quad(high Dose 65+) 10/13/2021   Hepatitis A 04/03/2016   Influenza, High Dose Seasonal PF 10/28/2014, 10/11/2015, 10/10/2016, 10/14/2017, 03/20/2018, 10/17/2018, 03/23/2019, 10/27/2019, 09/05/2020   Influenza,inj,Quad PF,6+ Mos 10/18/2014   Influenza,inj,quad, With Preservative 10/09/2018   Influenza-Unspecified 12/09/2012, 11/09/2013   Moderna Covid-19 Vaccine Bivalent Booster 40yrs & up 10/10/2020   Moderna Sars-Covid-2 Vaccination 02/20/2019, 03/21/2019, 03/23/2019, 09/25/2019   PNEUMOCOCCAL CONJUGATE-20 09/29/2020   Pneumococcal Conjugate-13 12/09/2013   Pneumococcal Polysaccharide-23 05/04/2011, 10/22/2013, 10/28/2014, 03/20/2018, 10/27/2019   Pneumococcal-Unspecified 09/28/2020   Respiratory Syncytial Virus Vaccine,Recomb Aduvanted(Arexvy) 01/02/2022   Tdap 06/09/2003, 06/09/2013   Typhoid Live 04/03/2016   Zoster Recombinat (Shingrix) 11/21/2020, 03/07/2021   Zoster, Live 04/21/2010    TDAP status: Up to date  Flu Vaccine status: Up to date  Pneumococcal vaccine status: Up to date  Covid-19 vaccine status: Completed vaccines  Qualifies for Shingles Vaccine? Yes   Zostavax completed No   Shingrix Completed?: No.    Education has been provided regarding the importance of this vaccine. Patient has been advised to call insurance company to determine out of pocket expense if they have not yet received this vaccine. Advised may also receive vaccine at local pharmacy or Health Dept. Verbalized acceptance and understanding.  Screening Tests Health Maintenance  Topic Date Due   COVID-19 Vaccine (7 - 2023-24 season) 01/26/2022   DEXA SCAN  03/09/2022   MAMMOGRAM  07/29/2022   INFLUENZA VACCINE  08/09/2022   Medicare Annual Wellness (AWV)  04/10/2023   DTaP/Tdap/Td (4 - Td or Tdap) 10/27/2024   COLONOSCOPY (Pts 45-74yrs Insurance coverage will need to be confirmed)  11/17/2024   Pneumonia Vaccine 72+ Years old  Completed   Hepatitis C Screening   Completed   Zoster Vaccines- Shingrix  Completed   HPV VACCINES  Aged Out    Health Maintenance  Health Maintenance Due  Topic Date Due   COVID-19 Vaccine (7 - 2023-24 season) 01/26/2022   DEXA SCAN  03/09/2022    Colorectal cancer screening: No longer required.   Mammogram status: No longer required due to age.  Bone Density status: Ordered 04/10/2022. Pt provided with contact info and advised to call to schedule appt.  Lung Cancer Screening: (Low Dose CT Chest recommended if Age 27-80 years, 30 pack-year currently smoking OR have quit w/in 15years.) does not qualify.   Lung Cancer Screening Referral: n/a  Additional Screening:  Hepatitis C Screening: does not qualify; Completed 12/26/2017  Vision Screening: Recommended annual ophthalmology exams for early detection of glaucoma and other disorders of the eye. Is the patient up to date with their annual eye exam?  Yes  Who is the provider or what is the name of the office in which the patient attends annual eye exams? Dr.Groat  If pt is not  established with a provider, would they like to be referred to a provider to establish care? No .   Dental Screening: Recommended annual dental exams for proper oral hygiene  Community Resource Referral / Chronic Care Management: CRR required this visit?  No   CCM required this visit?  No      Plan:     I have personally reviewed and noted the following in the patient's chart:   Medical and social history Use of alcohol, tobacco or illicit drugs  Current medications and supplements including opioid prescriptions. Patient is not currently taking opioid prescriptions. Functional ability and status Nutritional status Physical activity Advanced directives List of other physicians Hospitalizations, surgeries, and ER visits in previous 12 months Vitals Screenings to include cognitive, depression, and falls Referrals and appointments  In addition, I have reviewed and discussed  with patient certain preventive protocols, quality metrics, and best practice recommendations. A written personalized care plan for preventive services as well as general preventive health recommendations were provided to patient.     Daphane Shepherd, LPN   [id number]   Nurse Notes: none

## 2022-04-10 NOTE — Patient Instructions (Signed)
Cheryl Perry , Thank you for taking time to come for your Medicare Wellness Visit. I appreciate your ongoing commitment to your health goals. Please review the following plan we discussed and let me know if I can assist you in the future.   These are the goals we discussed:  Goals      Increase physical activity     Stay active, travel - spend time with Grandchildren      Patient Stated     04/06/2021 AWV Goal: Exercise for General Health  Patient will verbalize understanding of the benefits of increased physical activity: Exercising regularly is important. It will improve your overall fitness, flexibility, and endurance. Regular exercise also will improve your overall health. It can help you control your weight, reduce stress, and improve your bone density. Over the next year, patient will increase physical activity as tolerated with a goal of at least 150 minutes of moderate physical activity per week.  You can tell that you are exercising at a moderate intensity if your heart starts beating faster and you start breathing faster but can still hold a conversation. Moderate-intensity exercise ideas include: Walking 1 mile (1.6 km) in about 15 minutes Biking Hiking Golfing Dancing Water aerobics Patient will verbalize understanding of everyday activities that increase physical activity by providing examples like the following: Yard work, such as: Sales promotion account executive Gardening Washing windows or floors Patient will be able to explain general safety guidelines for exercising:  Before you start a new exercise program, talk with your health care provider. Do not exercise so much that you hurt yourself, feel dizzy, or get very short of breath. Wear comfortable clothes and wear shoes with good support. Drink plenty of water while you exercise to prevent dehydration or heat stroke. Work out until your breathing  and your heartbeat get faster.      Weight < 160 lb (72.576 kg)        This is a list of the screening recommended for you and due dates:  Health Maintenance  Topic Date Due   COVID-19 Vaccine (7 - 2023-24 season) 01/26/2022   DEXA scan (bone density measurement)  03/09/2022   Mammogram  07/29/2022   Flu Shot  08/09/2022   Medicare Annual Wellness Visit  04/10/2023   DTaP/Tdap/Td vaccine (4 - Td or Tdap) 10/27/2024   Colon Cancer Screening  11/17/2024   Pneumonia Vaccine  Completed   Hepatitis C Screening: USPSTF Recommendation to screen - Ages 67-79 yo.  Completed   Zoster (Shingles) Vaccine  Completed   HPV Vaccine  Aged Out    Advanced directives: Please bring a copy of your health care power of attorney and living will to the office to be added to your chart at your convenience.   Conditions/risks identified: Aim for 30 minutes of exercise or brisk walking, 6-8 glasses of water, and 5 servings of fruits and vegetables each day.   Next appointment: Follow up in one year for your annual wellness visit    Preventive Care 65 Years and Older, Female Preventive care refers to lifestyle choices and visits with your health care provider that can promote health and wellness. What does preventive care include? A yearly physical exam. This is also called an annual well check. Dental exams once or twice a year. Routine eye exams. Ask your health care provider how often you should have your eyes checked. Personal lifestyle choices, including: Daily  care of your teeth and gums. Regular physical activity. Eating a healthy diet. Avoiding tobacco and drug use. Limiting alcohol use. Practicing safe sex. Taking low-dose aspirin every day. Taking vitamin and mineral supplements as recommended by your health care provider. What happens during an annual well check? The services and screenings done by your health care provider during your annual well check will depend on your age, overall  health, lifestyle risk factors, and family history of disease. Counseling  Your health care provider may ask you questions about your: Alcohol use. Tobacco use. Drug use. Emotional well-being. Home and relationship well-being. Sexual activity. Eating habits. History of falls. Memory and ability to understand (cognition). Work and work Statistician. Reproductive health. Screening  You may have the following tests or measurements: Height, weight, and BMI. Blood pressure. Lipid and cholesterol levels. These may be checked every 5 years, or more frequently if you are over 69 years old. Skin check. Lung cancer screening. You may have this screening every year starting at age 30 if you have a 30-pack-year history of smoking and currently smoke or have quit within the past 15 years. Fecal occult blood test (FOBT) of the stool. You may have this test every year starting at age 27. Flexible sigmoidoscopy or colonoscopy. You may have a sigmoidoscopy every 5 years or a colonoscopy every 10 years starting at age 57. Hepatitis C blood test. Hepatitis B blood test. Sexually transmitted disease (STD) testing. Diabetes screening. This is done by checking your blood sugar (glucose) after you have not eaten for a while (fasting). You may have this done every 1-3 years. Bone density scan. This is done to screen for osteoporosis. You may have this done starting at age 14. Mammogram. This may be done every 1-2 years. Talk to your health care provider about how often you should have regular mammograms. Talk with your health care provider about your test results, treatment options, and if necessary, the need for more tests. Vaccines  Your health care provider may recommend certain vaccines, such as: Influenza vaccine. This is recommended every year. Tetanus, diphtheria, and acellular pertussis (Tdap, Td) vaccine. You may need a Td booster every 10 years. Zoster vaccine. You may need this after age  63. Pneumococcal 13-valent conjugate (PCV13) vaccine. One dose is recommended after age 89. Pneumococcal polysaccharide (PPSV23) vaccine. One dose is recommended after age 82. Talk to your health care provider about which screenings and vaccines you need and how often you need them. This information is not intended to replace advice given to you by your health care provider. Make sure you discuss any questions you have with your health care provider. Document Released: 01/21/2015 Document Revised: 09/14/2015 Document Reviewed: 10/26/2014 Elsevier Interactive Patient Education  2017 [place] Prevention in the Home Falls can cause injuries. They can happen to people of all ages. There are many things you can do to make your home safe and to help prevent falls. What can I do on the outside of my home? Regularly fix the edges of walkways and driveways and fix any cracks. Remove anything that might make you trip as you walk through a door, such as a raised step or threshold. Trim any bushes or trees on the path to your home. Use bright outdoor lighting. Clear any walking paths of anything that might make someone trip, such as rocks or tools. Regularly check to see if handrails are loose or broken. Make sure that both sides of any steps have handrails. Any  raised decks and porches should have guardrails on the edges. Have any leaves, snow, or ice cleared regularly. Use sand or salt on walking paths during winter. Clean up any spills in your garage right away. This includes oil or grease spills. What can I do in the bathroom? Use night lights. Install grab bars by the toilet and in the tub and shower. Do not use towel bars as grab bars. Use non-skid mats or decals in the tub or shower. If you need to sit down in the shower, use a plastic, non-slip stool. Keep the floor dry. Clean up any water that spills on the floor as soon as it happens. Remove soap buildup in the tub or shower  regularly. Attach bath mats securely with double-sided non-slip rug tape. Do not have throw rugs and other things on the floor that can make you trip. What can I do in the bedroom? Use night lights. Make sure that you have a light by your bed that is easy to reach. Do not use any sheets or blankets that are too big for your bed. They should not hang down onto the floor. Have a firm chair that has side arms. You can use this for support while you get dressed. Do not have throw rugs and other things on the floor that can make you trip. What can I do in the kitchen? Clean up any spills right away. Avoid walking on wet floors. Keep items that you use a lot in easy-to-reach places. If you need to reach something above you, use a strong step stool that has a grab bar. Keep electrical cords out of the way. Do not use floor polish or wax that makes floors slippery. If you must use wax, use non-skid floor wax. Do not have throw rugs and other things on the floor that can make you trip. What can I do with my stairs? Do not leave any items on the stairs. Make sure that there are handrails on both sides of the stairs and use them. Fix handrails that are broken or loose. Make sure that handrails are as long as the stairways. Check any carpeting to make sure that it is firmly attached to the stairs. Fix any carpet that is loose or worn. Avoid having throw rugs at the top or bottom of the stairs. If you do have throw rugs, attach them to the floor with carpet tape. Make sure that you have a light switch at the top of the stairs and the bottom of the stairs. If you do not have them, ask someone to add them for you. What else can I do to help prevent falls? Wear shoes that: Do not have high heels. Have rubber bottoms. Are comfortable and fit you well. Are closed at the toe. Do not wear sandals. If you use a stepladder: Make sure that it is fully opened. Do not climb a closed stepladder. Make sure that  both sides of the stepladder are locked into place. Ask someone to hold it for you, if possible. Clearly mark and make sure that you can see: Any grab bars or handrails. First and last steps. Where the edge of each step is. Use tools that help you move around (mobility aids) if they are needed. These include: Canes. Walkers. Scooters. Crutches. Turn on the lights when you go into a dark area. Replace any light bulbs as soon as they burn out. Set up your furniture so you have a clear path. Avoid moving your  furniture around. If any of your floors are uneven, fix them. If there are any pets around you, be aware of where they are. Review your medicines with your doctor. Some medicines can make you feel dizzy. This can increase your chance of falling. Ask your doctor what other things that you can do to help prevent falls. This information is not intended to replace advice given to you by your health care provider. Make sure you discuss any questions you have with your health care provider. Document Released: 10/21/2008 Document Revised: 06/02/2015 Document Reviewed: 01/29/2014 Elsevier Interactive Patient Education  2017 [employer].

## 2022-04-10 NOTE — Telephone Encounter (Signed)
Canceled at [facility] long and one sent to [facility] and rest sent to [facility]

## 2022-04-10 NOTE — Telephone Encounter (Signed)
Left detailed message.   

## 2022-04-19 DIAGNOSIS — H2512 Age-related nuclear cataract, left eye: Secondary | ICD-10-CM | POA: Diagnosis not present

## 2022-04-25 DIAGNOSIS — L813 Cafe au lait spots: Secondary | ICD-10-CM | POA: Diagnosis not present

## 2022-04-25 DIAGNOSIS — D1801 Hemangioma of skin and subcutaneous tissue: Secondary | ICD-10-CM | POA: Diagnosis not present

## 2022-04-25 DIAGNOSIS — D225 Melanocytic nevi of trunk: Secondary | ICD-10-CM | POA: Diagnosis not present

## 2022-04-25 DIAGNOSIS — D485 Neoplasm of uncertain behavior of skin: Secondary | ICD-10-CM | POA: Diagnosis not present

## 2022-04-25 DIAGNOSIS — L821 Other seborrheic keratosis: Secondary | ICD-10-CM | POA: Diagnosis not present

## 2022-05-07 DIAGNOSIS — C50411 Malignant neoplasm of upper-outer quadrant of right female breast: Secondary | ICD-10-CM | POA: Diagnosis not present

## 2022-05-07 DIAGNOSIS — Z170 Estrogen receptor positive status [ER+]: Secondary | ICD-10-CM | POA: Diagnosis not present

## 2022-05-17 DIAGNOSIS — L570 Actinic keratosis: Secondary | ICD-10-CM | POA: Diagnosis not present

## 2022-05-17 DIAGNOSIS — D485 Neoplasm of uncertain behavior of skin: Secondary | ICD-10-CM | POA: Diagnosis not present

## 2022-05-17 DIAGNOSIS — C44319 Basal cell carcinoma of skin of other parts of face: Secondary | ICD-10-CM | POA: Diagnosis not present

## 2022-05-22 DIAGNOSIS — J3081 Allergic rhinitis due to animal (cat) (dog) hair and dander: Secondary | ICD-10-CM | POA: Diagnosis not present

## 2022-05-22 DIAGNOSIS — J4540 Moderate persistent asthma, uncomplicated: Secondary | ICD-10-CM | POA: Diagnosis not present

## 2022-05-22 DIAGNOSIS — J3089 Other allergic rhinitis: Secondary | ICD-10-CM | POA: Diagnosis not present

## 2022-05-22 DIAGNOSIS — J301 Allergic rhinitis due to pollen: Secondary | ICD-10-CM | POA: Diagnosis not present

## 2022-07-09 DIAGNOSIS — M5116 Intervertebral disc disorders with radiculopathy, lumbar region: Secondary | ICD-10-CM | POA: Diagnosis not present

## 2022-07-09 DIAGNOSIS — M5030 Other cervical disc degeneration, unspecified cervical region: Secondary | ICD-10-CM | POA: Diagnosis not present

## 2022-07-18 NOTE — Progress Notes (Unsigned)
Office Visit Note  Patient: Cheryl Perry             Date of Birth: 30-Aug-1945           MRN: [id number]             PCP: Junie Spencer, FNP Referring: Junie Spencer, FNP Visit Date: 08/01/2022 Occupation: @GUAROCC @  Subjective:  Right knee joint pain  History of Present Illness: Cheryl Perry is a 77 y.o. female with history of osteoarthritis, chondrocalcinosis, DDD, and autoimmune hepatitis.  Patient remains under the care of gastroenterology at [facility].  Patient is taking cellcept 500 mg 1 tablet by mouth twice daily as prescribed by her gastroenterologist.  She has been having close lab monitoring. Patient states that she had a right knee joint cortisone injection performed at her last office visit with Dr. Corliss Skains on 01/31/22.  She states that she had significant relief after undergoing the cortisone injection.  She states that her symptoms have started to return and she requested a repeat cortisone injection today if she is eligible.  Patient states that in early June 2024 she also had an injection in her lower back performed by Dr. Ethelene Hal which was helpful at alleviating her symptoms.    Activities of Daily Living:  Patient reports morning stiffness for 30 minutes.   Patient Denies nocturnal pain.  Difficulty dressing/grooming: Denies Difficulty climbing stairs: Denies Difficulty getting out of chair: Reports Difficulty using hands for taps, buttons, cutlery, and/or writing: Reports  Review of Systems  Constitutional:  Positive for fatigue.  HENT:  Negative for mouth sores and mouth dryness.   Eyes:  Negative for dryness.  Respiratory:  Negative for shortness of breath.   Cardiovascular:  Negative for chest pain and palpitations.  Gastrointestinal:  Negative for blood in stool, constipation and diarrhea.  Endocrine: Negative for increased urination.  Genitourinary:  Negative for involuntary urination.  Musculoskeletal:  Positive for joint pain, joint pain,  myalgias, morning stiffness and myalgias. Negative for gait problem, joint swelling, muscle weakness and muscle tenderness.  Skin:  Negative for color change, rash, hair loss and sensitivity to sunlight.  Allergic/Immunologic: Negative for susceptible to infections.  Neurological:  Negative for dizziness and headaches.  Hematological:  Negative for swollen glands.  Psychiatric/Behavioral:  Negative for depressed mood and sleep disturbance. The patient is not nervous/anxious.     PMFS History:  Patient Active Problem List   Diagnosis Date Noted   HX: breast cancer 03/07/2021   Autoimmune hepatitis (HCC) 01/12/2019   Elevated liver function tests 12/25/2017   H/O adenomatous polyp of colon 12/12/2017   Malignant neoplasm of upper-outer quadrant of right breast in female, estrogen receptor positive (HCC) 07/12/2017   Back pain 12/14/2016   Overweight (BMI 25.0-29.9) 06/14/2015   Gastric ulceration    Hiatal hernia    History of lower GI bleeding    UGI bleed 01/27/2015   GERD (gastroesophageal reflux disease) 06/15/2014   Osteopenia 09/02/2013   Hypothyroidism 06/05/2012   HLD (hyperlipidemia) 06/05/2012   Asthma 06/05/2012    Past Medical History:  Diagnosis Date   Allergy    Arthritis    back painherniated disc lumbar   Asthma    Autoimmune hepatitis (HCC)    Back pain    Cancer (HCC) 07/2017   right breast cancer, lumpectomy and 20 sessions of XRT   Complication of anesthesia    GERD (gastroesophageal reflux disease)    Hiatal hernia    Hyperlipidemia  Hypothyroidism    Osteopenia    PONV (postoperative nausea and vomiting)    asks for scop patch   Thyroid disease    Vasculopathy    lymphatic    Family History  Problem Relation Age of Onset   Stroke Mother    Hypertension Mother    Alzheimer's disease Mother    Heart disease Sister    Hypertension Sister    Heart attack Sister        pace maker    Arthritis Daughter        very bad MVA    Hypertension  Son    Seizures Son    Colon cancer Neg Hx    Liver cancer Neg Hx    Pancreatic disease Neg Hx    Past Surgical History:  Procedure Laterality Date   BASAL CELL CARCINOMA EXCISION     BREAST LUMPECTOMY WITH RADIOACTIVE SEED AND SENTINEL LYMPH NODE BIOPSY Right 08/22/2017   Procedure: BREAST LUMPECTOMY WITH RADIOACTIVE SEED AND SENTINEL LYMPH NODE BIOPSY;  Surgeon: Harriette Bouillon, MD;  Location: [facility];  Service: General;  Laterality: Right;   CATARACT EXTRACTION     COLONOSCOPY  2015   Dr. Kinnie Scales: Hemorrhoids, diverticulosis.  Next colonoscopy in 5 years due to previous history of adenomatous colon polyps   COLONOSCOPY N/A 11/18/2019   Procedure: COLONOSCOPY;  Surgeon: Corbin Ade, MD;  Location: [facility];  Service: Endoscopy;  Laterality: N/A;  9:30   CYSTOSCOPY     cystscopy     ESOPHAGOGASTRODUODENOSCOPY N/A 01/28/2015   Dr.Rourk- 3 small prepyloric/gastric ulcers- likely culprits causing bleeding. hiatal hernia, small gastic polyps not manipulated.   ESOPHAGOGASTRODUODENOSCOPY N/A 05/26/2015   Dr. Jena Gauss: Small hiatal hernia, previous gastric ulcers completely healed.   KNEE SURGERY     LIVER BIOPSY  10/09/2018   TONSILLECTOMY     TUBAL LIGATION     Social History   Social History Narrative   Son in [place], granddaughter in [place]   Very socially active   Immunization History  Administered Date(s) Administered   Covid-19, Mrna,Vaccine(Spikevax)88yrs and older 12/01/2021   DTaP 10/28/2014   Fluad Quad(high Dose 65+) 10/13/2021   Hepatitis A 04/03/2016   Influenza, High Dose Seasonal PF 10/28/2014, 10/11/2015, 10/10/2016, 10/14/2017, 03/20/2018, 10/17/2018, 03/23/2019, 10/27/2019, 09/05/2020   Influenza,inj,Quad PF,6+ Mos 10/18/2014   Influenza,inj,quad, With Preservative 10/09/2018   Influenza-Unspecified 12/09/2012, 11/09/2013   Moderna Covid-19 Vaccine Bivalent Booster 59yrs & up 10/10/2020   Moderna Sars-Covid-2 Vaccination  02/20/2019, 03/21/2019, 03/23/2019, 09/25/2019   PNEUMOCOCCAL CONJUGATE-20 09/29/2020   Pneumococcal Conjugate-13 12/09/2013   Pneumococcal Polysaccharide-23 05/04/2011, 10/22/2013, 10/28/2014, 03/20/2018, 10/27/2019   Pneumococcal-Unspecified 09/28/2020   Respiratory Syncytial Virus Vaccine,Recomb Aduvanted(Arexvy) 01/02/2022   Tdap 06/09/2003, 06/09/2013   Typhoid Live 04/03/2016   Zoster Recombinant(Shingrix) 11/21/2020, 03/07/2021   Zoster, Live 04/21/2010     Objective: Vital Signs: BP 125/84 (BP Location: Left Arm, Patient Position: Sitting, Cuff Size: Normal)   Pulse 61   Resp 14   Ht 5\' 6"  (1.676 m)   Wt 169 lb (76.7 kg)   BMI 27.28 kg/m    Physical Exam Vitals and nursing note reviewed.  Constitutional:      Appearance: She is well-developed.  HENT:     Head: Normocephalic and atraumatic.  Eyes:     Conjunctiva/sclera: Conjunctivae normal.  Cardiovascular:     Rate and Rhythm: Normal rate and regular rhythm.     Heart sounds: Normal heart sounds.  Pulmonary:     Effort:  Pulmonary effort is normal.     Breath sounds: Normal breath sounds.  Abdominal:     General: Bowel sounds are normal.     Palpations: Abdomen is soft.  Musculoskeletal:     Cervical back: Normal range of motion.  Lymphadenopathy:     Cervical: No cervical adenopathy.  Skin:    General: Skin is warm and dry.     Capillary Refill: Capillary refill takes less than 2 seconds.  Neurological:     Mental Status: She is alert and oriented to person, place, and time.  Psychiatric:        Behavior: Behavior normal.      Musculoskeletal Exam: C-spine has limited range of motion.  Thoracic kyphosis noted.  Limited mobility of the lumbar spine.  Shoulder joints, elbow joints, wrist joints, MCPs, PIPs, DIPs have good range of motion with no synovitis.  PIP and DIP thickening consistent with osteoarthritis.  Subluxation of several PIP and DIP joints.  No tenderness or synovitis over MCP joints.  Hip  joints have good range of motion with no groin pain.  Left knee has good ROM with no warmth or effusion.  Right knee has discomfort with ROM with warmth.  Ankle joints have good range of motion with no tenderness or joint swelling.  CDAI Exam: CDAI Score: -- Patient Global: --; Provider Global: -- Swollen: --; Tender: -- Joint Exam 08/01/2022   No joint exam has been documented for this visit   There is currently no information documented on the homunculus. Go to the Rheumatology activity and complete the homunculus joint exam.  Investigation: No additional findings.  Imaging: No results found.  Recent Labs: Lab Results  Component Value Date   WBC 5.3 03/08/2022   HGB 14.2 03/08/2022   PLT 241 03/08/2022   NA 143 03/08/2022   K 4.5 03/08/2022   CL 105 03/08/2022   CO2 21 03/08/2022   GLUCOSE 85 03/08/2022   BUN 9 03/08/2022   CREATININE 0.89 03/08/2022   BILITOT 0.5 03/08/2022   ALKPHOS 76 03/08/2022   AST 19 03/08/2022   ALT 11 03/08/2022   PROT 6.0 03/08/2022   ALBUMIN 4.2 03/08/2022   CALCIUM 9.7 03/08/2022   GFRAA 84 09/03/2019    Speciality Comments: Imuran-discontinued due to cholestatic liver injury and upper GI side effects.  Procedures:  Large Joint Inj: R knee on 08/01/2022 1:02 PM Indications: pain Details: 27 G 1.5 in needle, medial approach  Arthrogram: No  Medications: 1.5 mL lidocaine 1 %; 40 mg triamcinolone acetonide 40 MG/ML Aspirate: 0 mL Outcome: tolerated well, no immediate complications Procedure, treatment alternatives, risks and benefits explained, specific risks discussed. Consent was given by the patient. Immediately prior to procedure a time out was called to verify the correct patient, procedure, equipment, support staff and site/side marked as required. Patient was prepped and draped in the usual sterile fashion.     Allergies: Azathioprine, Statins, Sulfa antibiotics, Methocarbamol, Naproxen, Lipitor [atorvastatin], Premarin  [conjugated estrogens], Ampicillin, Avelox [moxifloxacin hcl in nacl], Azithromycin, Bactrim [sulfamethoxazole-trimethoprim], Crestor [rosuvastatin calcium], Doxycycline, Levaquin [levofloxacin in d5w], Livalo [pitavastatin], Macrodantin [nitrofurantoin macrocrystal], and Trental [pentoxifylline er]   Assessment / Plan:     Visit Diagnoses: Primary osteoarthritis of both hands: She has PIP and DIP thickening consistent with osteoarthritis of both hands.  Subluxation of several PIP and DIP joints.  No synovitis noted.  Discussed the importance of joint protection and muscle strengthening.  Chronic pain of right knee: Patient presents today with increased pain in  the right knee joint.  No recent injury or fall.  Warmth of the right knee noted on exam.  She had a right knee joint cortisone injection performed on 01/31/2022 which provided significant relief but her symptoms have returned.  She requested a repeat right knee joint cortisone injection today.  She tolerated the procedure well.  Procedure note was completed above.  Aftercare was discussed.  She was advised to notify us if her symptoms persist or worsen.- Plan: Large Joint Inj: R knee  Chondrocalcinosis of right knee - X-rays showed mild osteoarthritis, moderate chondromalacia patella and chondrocalcinosis.  Warmth in the right knee noted today.  After informed consent the right knee joint was injected with cortisone today.  She tolerated procedure well.  Procedure note was completed above.  Aftercare was discussed.  Primary osteoarthritis of both feet: She is not experiencing any increased discomfort in her feet at this time.   DDD (degenerative disc disease), cervical: Limited ROM of the C-spine.   DDD (degenerative disc disease), thoracic: Thoracic kyphosis noted.  DDD (degenerative disc disease), lumbar: Under care of Dr. Greggory Brandy performed early June 2024 alleviated her symptoms.   Osteopenia of multiple sites - DXA 03/07/20 T  score -2.3 RFN.  She takes calcium and vitamin D.  Her DEXA scan is followed by her PCP.  Autoimmune hepatitis (HCC) - Bx positive on 10/02/18.  She is followed by Dr. Benard Rink at [facility].  She remains on Cellcept 500 mg 1 tablet by mouth twice daily. LFTs WNL on 03/08/22.   High risk medication use - She remains on CellCept 500 mg 1 tablet p.o. twice daily.CBC and CMP updated on 03/08/22.   Positive ANA (antinuclear antibody) - AVISE index -0.4, ANA titer negative, RF 18, TPO antibody positive.  She has no other clinical features of autoimmune disease.  Other medical conditions are listed as follows:   Vitamin D deficiency  Malignant neoplasm of upper-outer quadrant of right breast in female, estrogen receptor positive (HCC)  History of hyperlipidemia  H/O adenomatous polyp of colon  History of asthma  History of gastroesophageal reflux (GERD)  History of lower GI bleeding  Hiatal hernia  History of hypothyroidism  Orders: Orders Placed This Encounter  Procedures   Large Joint Inj: R knee   No orders of the defined types were placed in this encounter.   Follow-Up Instructions: Return in about 6 months (around 02/01/2023) for Osteoarthritis, Chondrocalcinosis .   Gearldine Bienenstock, PA-C  Note - This record has been created using Dragon software.  Chart creation errors have been sought, but may not always  have been located. Such creation errors do not reflect on  the standard of medical care.

## 2022-07-19 DIAGNOSIS — M5416 Radiculopathy, lumbar region: Secondary | ICD-10-CM | POA: Diagnosis not present

## 2022-07-30 DIAGNOSIS — Z1231 Encounter for screening mammogram for malignant neoplasm of breast: Secondary | ICD-10-CM | POA: Diagnosis not present

## 2022-07-30 DIAGNOSIS — K754 Autoimmune hepatitis: Secondary | ICD-10-CM | POA: Diagnosis not present

## 2022-07-31 DIAGNOSIS — D849 Immunodeficiency, unspecified: Secondary | ICD-10-CM | POA: Diagnosis not present

## 2022-07-31 DIAGNOSIS — K754 Autoimmune hepatitis: Secondary | ICD-10-CM | POA: Diagnosis not present

## 2022-08-01 ENCOUNTER — Ambulatory Visit: Payer: Medicare Other | Attending: Physician Assistant | Admitting: Physician Assistant

## 2022-08-01 ENCOUNTER — Encounter: Payer: Self-pay | Admitting: Physician Assistant

## 2022-08-01 VITALS — BP 125/84 | HR 61 | Resp 14 | Ht 66.0 in | Wt 169.0 lb

## 2022-08-01 DIAGNOSIS — K754 Autoimmune hepatitis: Secondary | ICD-10-CM | POA: Insufficient documentation

## 2022-08-01 DIAGNOSIS — M8589 Other specified disorders of bone density and structure, multiple sites: Secondary | ICD-10-CM | POA: Diagnosis not present

## 2022-08-01 DIAGNOSIS — Z79899 Other long term (current) drug therapy: Secondary | ICD-10-CM | POA: Diagnosis not present

## 2022-08-01 DIAGNOSIS — E559 Vitamin D deficiency, unspecified: Secondary | ICD-10-CM | POA: Insufficient documentation

## 2022-08-01 DIAGNOSIS — M19071 Primary osteoarthritis, right ankle and foot: Secondary | ICD-10-CM | POA: Diagnosis not present

## 2022-08-01 DIAGNOSIS — M19042 Primary osteoarthritis, left hand: Secondary | ICD-10-CM | POA: Diagnosis not present

## 2022-08-01 DIAGNOSIS — M5134 Other intervertebral disc degeneration, thoracic region: Secondary | ICD-10-CM | POA: Diagnosis not present

## 2022-08-01 DIAGNOSIS — M19072 Primary osteoarthritis, left ankle and foot: Secondary | ICD-10-CM | POA: Diagnosis not present

## 2022-08-01 DIAGNOSIS — M11261 Other chondrocalcinosis, right knee: Secondary | ICD-10-CM | POA: Insufficient documentation

## 2022-08-01 DIAGNOSIS — M5136 Other intervertebral disc degeneration, lumbar region: Secondary | ICD-10-CM | POA: Diagnosis not present

## 2022-08-01 DIAGNOSIS — Z86010 Personal history of colonic polyps: Secondary | ICD-10-CM | POA: Diagnosis not present

## 2022-08-01 DIAGNOSIS — K449 Diaphragmatic hernia without obstruction or gangrene: Secondary | ICD-10-CM | POA: Insufficient documentation

## 2022-08-01 DIAGNOSIS — M19041 Primary osteoarthritis, right hand: Secondary | ICD-10-CM | POA: Insufficient documentation

## 2022-08-01 DIAGNOSIS — Z8719 Personal history of other diseases of the digestive system: Secondary | ICD-10-CM | POA: Insufficient documentation

## 2022-08-01 DIAGNOSIS — G8929 Other chronic pain: Secondary | ICD-10-CM | POA: Insufficient documentation

## 2022-08-01 DIAGNOSIS — R768 Other specified abnormal immunological findings in serum: Secondary | ICD-10-CM | POA: Insufficient documentation

## 2022-08-01 DIAGNOSIS — Z8709 Personal history of other diseases of the respiratory system: Secondary | ICD-10-CM | POA: Diagnosis not present

## 2022-08-01 DIAGNOSIS — M25561 Pain in right knee: Secondary | ICD-10-CM | POA: Insufficient documentation

## 2022-08-01 DIAGNOSIS — M5030 Other cervical disc degeneration, unspecified cervical region: Secondary | ICD-10-CM | POA: Insufficient documentation

## 2022-08-01 DIAGNOSIS — Z170 Estrogen receptor positive status [ER+]: Secondary | ICD-10-CM | POA: Insufficient documentation

## 2022-08-01 DIAGNOSIS — Z8639 Personal history of other endocrine, nutritional and metabolic disease: Secondary | ICD-10-CM | POA: Insufficient documentation

## 2022-08-01 DIAGNOSIS — C50411 Malignant neoplasm of upper-outer quadrant of right female breast: Secondary | ICD-10-CM | POA: Insufficient documentation

## 2022-08-01 MED ORDER — LIDOCAINE HCL 1 % IJ SOLN
1.5000 mL | INTRAMUSCULAR | Status: AC | PRN
  Administered 2022-08-01: 1.5 mL

## 2022-08-01 MED ORDER — TRIAMCINOLONE ACETONIDE 40 MG/ML IJ SUSP
40.0000 mg | INTRAMUSCULAR | Status: AC | PRN
  Administered 2022-08-01: 40 mg via INTRA_ARTICULAR

## 2022-08-03 ENCOUNTER — Encounter (INDEPENDENT_AMBULATORY_CARE_PROVIDER_SITE_OTHER): Payer: Medicare Other

## 2022-08-03 DIAGNOSIS — R7989 Other specified abnormal findings of blood chemistry: Secondary | ICD-10-CM | POA: Diagnosis not present

## 2022-08-03 DIAGNOSIS — K754 Autoimmune hepatitis: Secondary | ICD-10-CM

## 2022-08-06 NOTE — Telephone Encounter (Signed)
I have placed future order.   Jannifer Rodney, FNP   Approximately 5 minutes was spent documenting and reviewing patient's chart.

## 2022-08-07 ENCOUNTER — Other Ambulatory Visit: Payer: Medicare Other

## 2022-08-07 ENCOUNTER — Encounter: Payer: Self-pay | Admitting: Family

## 2022-08-07 DIAGNOSIS — K754 Autoimmune hepatitis: Secondary | ICD-10-CM | POA: Diagnosis not present

## 2022-08-07 DIAGNOSIS — R7989 Other specified abnormal findings of blood chemistry: Secondary | ICD-10-CM | POA: Diagnosis not present

## 2022-09-06 ENCOUNTER — Ambulatory Visit (INDEPENDENT_AMBULATORY_CARE_PROVIDER_SITE_OTHER): Payer: Medicare Other | Admitting: Family

## 2022-09-06 ENCOUNTER — Ambulatory Visit (INDEPENDENT_AMBULATORY_CARE_PROVIDER_SITE_OTHER): Payer: Medicare Other

## 2022-09-06 ENCOUNTER — Encounter: Payer: Self-pay | Admitting: Family

## 2022-09-06 VITALS — BP 129/72 | HR 64 | Temp 97.5°F | Ht 66.0 in | Wt 168.2 lb

## 2022-09-06 DIAGNOSIS — M8589 Other specified disorders of bone density and structure, multiple sites: Secondary | ICD-10-CM

## 2022-09-06 DIAGNOSIS — G8929 Other chronic pain: Secondary | ICD-10-CM

## 2022-09-06 DIAGNOSIS — E785 Hyperlipidemia, unspecified: Secondary | ICD-10-CM | POA: Diagnosis not present

## 2022-09-06 DIAGNOSIS — M8588 Other specified disorders of bone density and structure, other site: Secondary | ICD-10-CM | POA: Diagnosis not present

## 2022-09-06 DIAGNOSIS — K219 Gastro-esophageal reflux disease without esophagitis: Secondary | ICD-10-CM | POA: Diagnosis not present

## 2022-09-06 DIAGNOSIS — M545 Low back pain, unspecified: Secondary | ICD-10-CM

## 2022-09-06 DIAGNOSIS — J4531 Mild persistent asthma with (acute) exacerbation: Secondary | ICD-10-CM | POA: Diagnosis not present

## 2022-09-06 DIAGNOSIS — K754 Autoimmune hepatitis: Secondary | ICD-10-CM | POA: Diagnosis not present

## 2022-09-06 DIAGNOSIS — Z853 Personal history of malignant neoplasm of breast: Secondary | ICD-10-CM | POA: Diagnosis not present

## 2022-09-06 DIAGNOSIS — E663 Overweight: Secondary | ICD-10-CM | POA: Diagnosis not present

## 2022-09-06 DIAGNOSIS — R7989 Other specified abnormal findings of blood chemistry: Secondary | ICD-10-CM | POA: Diagnosis not present

## 2022-09-06 DIAGNOSIS — E038 Other specified hypothyroidism: Secondary | ICD-10-CM

## 2022-09-06 DIAGNOSIS — Z780 Asymptomatic menopausal state: Secondary | ICD-10-CM | POA: Diagnosis not present

## 2022-09-06 MED ORDER — LEVOTHYROXINE SODIUM 75 MCG PO TABS
75.0000 ug | ORAL_TABLET | Freq: Every day | ORAL | 1 refills | Status: DC
Start: 2022-09-06 — End: 2023-03-08

## 2022-09-06 NOTE — Patient Instructions (Signed)
Health Maintenance After Age 77 After age 77, you are at a higher risk for certain long-term diseases and infections as well as injuries from falls. Falls are a major cause of broken bones and head injuries in people who are older than age 77. Getting regular preventive care can help to keep you healthy and well. Preventive care includes getting regular testing and making lifestyle changes as recommended by your health care provider. Talk with your health care provider about: Which screenings and tests you should have. A screening is a test that checks for a disease when you have no symptoms. A diet and exercise plan that is right for you. What should I know about screenings and tests to prevent falls? Screening and testing are the best ways to find a health problem early. Early diagnosis and treatment give you the best chance of managing medical conditions that are common after age 77. Certain conditions and lifestyle choices may make you more likely to have a fall. Your health care provider may recommend: Regular vision checks. Poor vision and conditions such as cataracts can make you more likely to have a fall. If you wear glasses, make sure to get your prescription updated if your vision changes. Medicine review. Work with your health care provider to regularly review all of the medicines you are taking, including over-the-counter medicines. Ask your health care provider about any side effects that may make you more likely to have a fall. Tell your health care provider if any medicines that you take make you feel dizzy or sleepy. Strength and balance checks. Your health care provider may recommend certain tests to check your strength and balance while standing, walking, or changing positions. Foot health exam. Foot pain and numbness, as well as not wearing proper footwear, can make you more likely to have a fall. Screenings, including: Osteoporosis screening. Osteoporosis is a condition that causes  the bones to get weaker and break more easily. Blood pressure screening. Blood pressure changes and medicines to control blood pressure can make you feel dizzy. Depression screening. You may be more likely to have a fall if you have a fear of falling, feel depressed, or feel unable to do activities that you used to do. Alcohol use screening. Using too much alcohol can affect your balance and may make you more likely to have a fall. Follow these instructions at home: Lifestyle Do not drink alcohol if: Your health care provider tells you not to drink. If you drink alcohol: Limit how much you have to: 0-1 drink a day for women. 0-2 drinks a day for men. Know how much alcohol is in your drink. In the [place] one drink equals one 12 oz bottle of beer (355 mL), one 5 oz glass of wine (148 mL), or one 1 oz glass of hard liquor (44 mL). Do not use any products that contain nicotine or tobacco. These products include cigarettes, chewing tobacco, and vaping devices, such as e-cigarettes. If you need help quitting, ask your health care provider. Activity  Follow a regular exercise program to stay fit. This will help you maintain your balance. Ask your health care provider what types of exercise are appropriate for you. If you need a cane or walker, use it as recommended by your health care provider. Wear supportive shoes that have nonskid soles. Safety  Remove any tripping hazards, such as rugs, cords, and clutter. Install safety equipment such as grab bars in bathrooms and safety rails on stairs. Keep rooms and walkways   well-lit. General instructions Talk with your health care provider about your risks for falling. Tell your health care provider if: You fall. Be sure to tell your health care provider about all falls, even ones that seem minor. You feel dizzy, tiredness (fatigue), or off-balance. Take over-the-counter and prescription medicines only as told by your health care provider. These include  supplements. Eat a healthy diet and maintain a healthy weight. A healthy diet includes low-fat dairy products, low-fat (lean) meats, and fiber from whole grains, beans, and lots of fruits and vegetables. Stay current with your vaccines. Schedule regular health, dental, and eye exams. Summary Having a healthy lifestyle and getting preventive care can help to protect your health and wellness after age 77. Screening and testing are the best way to find a health problem early and help you avoid having a fall. Early diagnosis and treatment give you the best chance for managing medical conditions that are more common for people who are older than age 77. Falls are a major cause of broken bones and head injuries in people who are older than age 77. Take precautions to prevent a fall at home. Work with your health care provider to learn what changes you can make to improve your health and wellness and to prevent falls. This information is not intended to replace advice given to you by your health care provider. Make sure you discuss any questions you have with your health care provider. Document Revised: 05/16/2020 Document Reviewed: 05/16/2020 Elsevier Patient Education  2024 Elsevier Inc.  

## 2022-09-06 NOTE — Progress Notes (Signed)
Subjective:    Patient ID: Cheryl Perry, female    DOB: [birth year], 77 y.o.   MRN: [id number]  Chief Complaint  Patient presents with   Medical Management of Chronic Issues   Pt presents to the office today for chronic follow up.  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 She is currently taking Cellcept  and doing well.    Pt is followed by allergen specialists annually for her asthma. PT is followed by Ortho as needed for chronic back pain and left knee pain.   She has history of right breast cancer. She completed radiation in 10/29/17.    She has osteopenia and takes calcium and vit D. She does do 1 mile of walking 3 times a week. Asthma She complains of hoarse voice. There is no cough, shortness of breath or wheezing. This is a chronic problem. The current episode started more than 1 year ago. The problem occurs intermittently. Associated symptoms include heartburn. Her symptoms are aggravated by pollen. Her symptoms are alleviated by rest. She reports moderate improvement on treatment. Her past medical history is significant for asthma.  Gastroesophageal Reflux She complains of belching, heartburn and a hoarse voice. She reports no coughing or no wheezing. This is a chronic problem. The current episode started more than 1 year ago. The problem occurs occasionally. Associated symptoms include fatigue. Risk factors include obesity. She has tried a PPI for the symptoms. The treatment provided moderate relief.  Thyroid Problem Presents for follow-up visit. Symptoms include dry skin, fatigue and hoarse voice. Patient reports no anxiety, constipation or diaphoresis. The symptoms have been stable. Her past medical history is significant for hyperlipidemia.  Hyperlipidemia This is a chronic problem. The current episode started more than 1 year ago. The problem is uncontrolled. Exacerbating diseases  include obesity. Pertinent negatives include no shortness of breath. Current antihyperlipidemic treatment includes diet change. The current treatment provides mild improvement of lipids. Risk factors for coronary artery disease include dyslipidemia, hypertension, a sedentary lifestyle and post-menopausal.  Back Pain This is a chronic problem. The current episode started more than 1 year ago. The problem occurs intermittently. The problem has been waxing and waning since onset. The pain is present in the lumbar spine. The quality of the pain is described as aching. The pain is at a severity of 3/10. The pain is mild. The treatment provided mild relief.      Review of Systems  Constitutional:  Positive for fatigue. Negative for diaphoresis.  HENT:  Positive for hoarse voice.   Respiratory:  Negative for cough, shortness of breath and wheezing.   Gastrointestinal:  Positive for heartburn. Negative for constipation.  Musculoskeletal:  Positive for back pain.  Psychiatric/Behavioral:  The patient is not nervous/anxious.   All other systems reviewed and are negative.      Objective:   Physical Exam Vitals reviewed.  Constitutional:      General: She is not in acute distress.    Appearance: She is well-developed.  HENT:     Head: Normocephalic and atraumatic.     Right Ear: There is impacted cerumen.     Left Ear: Tympanic membrane normal.  Eyes:     Pupils: Pupils are equal, round, and reactive to light.  Neck:     Thyroid: No thyromegaly.  Cardiovascular:     Rate and Rhythm: Normal rate and regular rhythm.  Heart sounds: Normal heart sounds. No murmur heard. Pulmonary:     Effort: Pulmonary effort is normal. No respiratory distress.     Breath sounds: Normal breath sounds. No wheezing.  Abdominal:     General: Bowel sounds are normal. There is no distension.     Palpations: Abdomen is soft.     Tenderness: There is no abdominal tenderness.  Musculoskeletal:        General:  No tenderness. Normal range of motion.     Cervical back: Normal range of motion and neck supple.  Skin:    General: Skin is warm and dry.  Neurological:     Mental Status: She is alert and oriented to person, place, and time.     Cranial Nerves: No cranial nerve deficit.     Deep Tendon Reflexes: Reflexes are normal and symmetric.  Psychiatric:        Behavior: Behavior normal.        Thought Content: Thought content normal.        Judgment: Judgment normal.          BP 129/72   Pulse 64   Temp (!) 97.5 F (36.4 C) (Temporal)   Ht 5\' 6"  (1.676 m)   Wt 168 lb 3.2 oz (76.3 kg)   SpO2 97%   BMI 27.15 kg/m   Assessment & Plan:   Cheryl Perry comes in today with chief complaint of Medical Management of Chronic Issues   Diagnosis and orders addressed:  1. Other specified hypothyroidism - levothyroxine (SYNTHROID) 75 MCG tablet; Take 1 tablet (75 mcg total) by mouth daily before breakfast.  Dispense: 90 tablet; Refill: 1 - CMP14+EGFR - CBC with Differential/Platelet - TSH  2. Mild persistent asthma with acute exacerbation - CMP14+EGFR - CBC with Differential/Platelet  3. Autoimmune hepatitis (HCC) - CMP14+EGFR - CBC with Differential/Platelet  4. Chronic bilateral low back pain without sciatica - CMP14+EGFR - CBC with Differential/Platelet  5. Gastroesophageal reflux disease without esophagitis - CMP14+EGFR - CBC with Differential/Platelet  6. Hyperlipidemia, unspecified hyperlipidemia type - CMP14+EGFR - CBC with Differential/Platelet - Lipid panel  7. HX: breast cancer - CMP14+EGFR - CBC with Differential/Platelet - DG [facility] DEXA  8. Overweight (BMI 25.0-29.9)  - CMP14+EGFR - CBC with Differential/Platelet  9. Osteopenia of multiple sites - CMP14+EGFR - CBC with Differential/Platelet - DG [facility] DEXA  10. History of breast cancer - CMP14+EGFR - CBC with Differential/Platelet  11. Elevated liver function tests - CMP14+EGFR - CBC with  Differential/Platelet   Labs pending Continue current medications  Health Maintenance reviewed Diet and exercise encouraged  Follow up plan: 6 months    Cheryl Rodney, FNP

## 2022-09-07 ENCOUNTER — Other Ambulatory Visit: Payer: Self-pay | Admitting: Family

## 2022-09-07 LAB — LIPID PANEL
Chol/HDL Ratio: 3.2 ratio (ref 0.0–4.4)
Cholesterol, Total: 256 mg/dL — ABNORMAL HIGH (ref 100–199)
HDL: 80 mg/dL (ref >39)
LDL Chol Calc (NIH): 163 mg/dL — ABNORMAL HIGH (ref 0–99)
Triglycerides: 76 mg/dL (ref 0–149)
VLDL Cholesterol Cal: 13 mg/dL (ref 5–40)

## 2022-09-07 LAB — CBC WITH DIFFERENTIAL/PLATELET
Basophils Absolute: 0 10*3/uL (ref 0.0–0.2)
Basos: 1 %
EOS (ABSOLUTE): 0.1 10*3/uL (ref 0.0–0.4)
Eos: 1 %
Hematocrit: 44 % (ref 34.0–46.6)
Hemoglobin: 14.5 g/dL (ref 11.1–15.9)
Immature Grans (Abs): 0 10*3/uL (ref 0.0–0.1)
Immature Granulocytes: 0 %
Lymphocytes Absolute: 1.1 10*3/uL (ref 0.7–3.1)
Lymphs: 20 %
MCH: 32.2 pg (ref 26.6–33.0)
MCHC: 33 g/dL (ref 31.5–35.7)
MCV: 98 fL — ABNORMAL HIGH (ref 79–97)
Monocytes Absolute: 0.5 10*3/uL (ref 0.1–0.9)
Monocytes: 10 %
Neutrophils Absolute: 3.7 10*3/uL (ref 1.4–7.0)
Neutrophils: 68 %
Platelets: 211 10*3/uL (ref 150–450)
RBC: 4.5 x10E6/uL (ref 3.77–5.28)
RDW: 12.6 % (ref 11.7–15.4)
WBC: 5.4 10*3/uL (ref 3.4–10.8)

## 2022-09-07 LAB — TSH: TSH: 2.71 u[IU]/mL (ref 0.450–4.500)

## 2022-09-07 LAB — CMP14+EGFR
ALT: 14 IU/L (ref 0–32)
AST: 21 IU/L (ref 0–40)
Albumin: 4 g/dL (ref 3.8–4.8)
Alkaline Phosphatase: 64 IU/L (ref 44–121)
BUN/Creatinine Ratio: 13 (ref 12–28)
BUN: 11 mg/dL (ref 8–27)
Bilirubin Total: 0.4 mg/dL (ref 0.0–1.2)
CO2: 23 mmol/L (ref 20–29)
Calcium: 9.3 mg/dL (ref 8.7–10.3)
Chloride: 106 mmol/L (ref 96–106)
Creatinine, Ser: 0.84 mg/dL (ref 0.57–1.00)
Globulin, Total: 1.8 g/dL (ref 1.5–4.5)
Glucose: 81 mg/dL (ref 70–99)
Potassium: 4 mmol/L (ref 3.5–5.2)
Sodium: 141 mmol/L (ref 134–144)
Total Protein: 5.8 g/dL — ABNORMAL LOW (ref 6.0–8.5)
eGFR: 72 mL/min/{1.73_m2} (ref >59)

## 2022-10-01 DIAGNOSIS — Z23 Encounter for immunization: Secondary | ICD-10-CM | POA: Diagnosis not present

## 2022-11-01 DIAGNOSIS — K754 Autoimmune hepatitis: Secondary | ICD-10-CM | POA: Diagnosis not present

## 2022-11-18 DIAGNOSIS — H9193 Unspecified hearing loss, bilateral: Secondary | ICD-10-CM

## 2022-12-03 DIAGNOSIS — Z961 Presence of intraocular lens: Secondary | ICD-10-CM | POA: Diagnosis not present

## 2022-12-03 DIAGNOSIS — H04123 Dry eye syndrome of bilateral lacrimal glands: Secondary | ICD-10-CM | POA: Diagnosis not present

## 2022-12-03 DIAGNOSIS — H43813 Vitreous degeneration, bilateral: Secondary | ICD-10-CM | POA: Diagnosis not present

## 2022-12-03 DIAGNOSIS — H17821 Peripheral opacity of cornea, right eye: Secondary | ICD-10-CM | POA: Diagnosis not present

## 2022-12-03 DIAGNOSIS — H02834 Dermatochalasis of left upper eyelid: Secondary | ICD-10-CM | POA: Diagnosis not present

## 2022-12-03 DIAGNOSIS — H02831 Dermatochalasis of right upper eyelid: Secondary | ICD-10-CM | POA: Diagnosis not present

## 2022-12-20 ENCOUNTER — Ambulatory Visit: Payer: Medicare Other | Attending: Family | Admitting: Audiology

## 2022-12-20 DIAGNOSIS — H9193 Unspecified hearing loss, bilateral: Secondary | ICD-10-CM | POA: Insufficient documentation

## 2022-12-20 DIAGNOSIS — H6121 Impacted cerumen, right ear: Secondary | ICD-10-CM | POA: Diagnosis not present

## 2022-12-20 NOTE — Procedures (Signed)
  Outpatient Audiology and [facility] [address] [place], [place]  [address] [phone]  AUDIOLOGICAL  EVALUATION  NAME: Cheryl Perry     DOB:   1945-05-31      MRN: [id number]                                                                                     DATE: 12/20/2022     REFERENT: Junie Spencer, FNP STATUS: Outpatient DIAGNOSIS: Cerumen impaction right ear  History: Cheryl Perry was seen for an audiological evaluation due to decreased hearing occurring for a few years.  Cheryl Perry reports a history of a right stapedectomy occurring many years ago.  She reports decreased hearing in the right ear.  Cheryl Perry reports right tinnitus that is worsening and increasing in intensity.  Cheryl Perry reports recent decreased hearing in the left ear however reports her left ear is her better hearing ear.  She reports difficulty hearing and communicating with friends and family.  Cheryl Perry reports increased difficulty hearing at church and hearing in [employer].  She denies aural fullness and vertigo.  Cheryl Perry reports she is interested in pursuing amplification.  Evaluation:  Otoscopy showed occluding cerumen in the right ear and the tympanic membrane could not be visualized.  Otoscopy showed a clear view of the tympanic membrane in the left ear. Tympanometry results were consistent in the right ear with no tympanic membrane mobility consistent with occluding cerumen (Type B) and normal middle ear pressure and normal tympanic membrane mobility in the left ear (Type A).  Audiometric testing was not completed due to right occluding cerumen.  Results:  The test results and recommended follow-up were reviewed with Cheryl Perry.  It was recommended for Cheryl Perry to start the use of Debrox drops for the right ear.  Cheryl Perry was counseled to schedule an appointment with her PCP for right ear cerumen removal.  An audiological evaluation will be completed upon cerumen removal.  Cheryl Perry was in agreement with  the plan today  Recommendations: 1.   Start use of Debrox drops for the right ear. 2.   Schedule an appointment with the PCP for right cerumen removal 3.   An audiological evaluation is scheduled for 01/24/2023 at 10:30 AM pending cerumen removal.   15 minutes spent testing and counseling on results.   If you have any questions please feel free to contact me at [phone].  Marton Redwood Audiologist, Au.D., CCC-A 12/20/2022  11:38 AM  Cc: Junie Spencer, FNP

## 2023-01-21 NOTE — Progress Notes (Deleted)
 Office Visit Note  Patient: Cheryl Perry             Date of Birth: 1945/08/18           MRN: [id number]             PCP: Junie Spencer, FNP Referring: Junie Spencer, FNP Visit Date: 01/31/2023 Occupation: @GUAROCC @  Subjective:  No chief complaint on file.   History of Present Illness: Cheryl Perry is a 78 y.o. female ***     Activities of Daily Living:  Patient reports morning stiffness for *** {minute/hour:19697}.   Patient {ACTIONS;DENIES/REPORTS:21021675::"Denies"} nocturnal pain.  Difficulty dressing/grooming: {ACTIONS;DENIES/REPORTS:21021675::"Denies"} Difficulty climbing stairs: {ACTIONS;DENIES/REPORTS:21021675::"Denies"} Difficulty getting out of chair: {ACTIONS;DENIES/REPORTS:21021675::"Denies"} Difficulty using hands for taps, buttons, cutlery, and/or writing: {ACTIONS;DENIES/REPORTS:21021675::"Denies"}  No Rheumatology ROS completed.   PMFS History:  Patient Active Problem List   Diagnosis Date Noted   History of breast cancer 03/07/2021   Autoimmune hepatitis (HCC) 01/12/2019   Elevated liver function tests 12/25/2017   H/O adenomatous polyp of colon 12/12/2017   Back pain 12/14/2016   Overweight (BMI 25.0-29.9) 06/14/2015   Gastric ulceration    Hiatal hernia    History of lower GI bleeding    UGI bleed 01/27/2015   GERD (gastroesophageal reflux disease) 06/15/2014   Osteopenia 09/02/2013   Hypothyroidism 06/05/2012   HLD (hyperlipidemia) 06/05/2012   Asthma 06/05/2012    Past Medical History:  Diagnosis Date   Allergy    Arthritis    back painherniated disc lumbar   Asthma    Autoimmune hepatitis (HCC)    Back pain    Cancer (HCC) 07/2017   right breast cancer, lumpectomy and 20 sessions of XRT   Complication of anesthesia    GERD (gastroesophageal reflux disease)    Hiatal hernia    Hyperlipidemia    Hypothyroidism    Osteopenia    PONV (postoperative nausea and vomiting)    asks for scop patch   Thyroid disease     Vasculopathy    lymphatic    Family History  Problem Relation Age of Onset   Stroke Mother    Hypertension Mother    Alzheimer's disease Mother    Heart disease Sister    Hypertension Sister    Heart attack Sister        Visual merchandiser    Arthritis Daughter        very bad MVA    Hypertension Son    Seizures Son    Colon cancer Neg Hx    Liver cancer Neg Hx    Pancreatic disease Neg Hx    Past Surgical History:  Procedure Laterality Date   BASAL CELL CARCINOMA EXCISION     BREAST LUMPECTOMY WITH RADIOACTIVE SEED AND SENTINEL LYMPH NODE BIOPSY Right 08/22/2017   Procedure: BREAST LUMPECTOMY WITH RADIOACTIVE SEED AND SENTINEL LYMPH NODE BIOPSY;  Surgeon: Harriette Bouillon, MD;  Location: [facility];  Service: General;  Laterality: Right;   CATARACT EXTRACTION     COLONOSCOPY  2015   Dr. Kinnie Scales: Hemorrhoids, diverticulosis.  Next colonoscopy in 5 years due to previous history of adenomatous colon polyps   COLONOSCOPY N/A 11/18/2019   Procedure: COLONOSCOPY;  Surgeon: Corbin Ade, MD;  Location[facility];  Service: Endoscopy;  Laterality: N/A;  9:30   CYSTOSCOPY     cystscopy     ESOPHAGOGASTRODUODENOSCOPY N/A 01/28/2015   Dr.Rourk- 3 small prepyloric/gastric ulcers- likely culprits causing bleeding. hiatal hernia, small gastic polyps not  manipulated.   ESOPHAGOGASTRODUODENOSCOPY N/A 05/26/2015   Dr. Jena Gauss: Small hiatal hernia, previous gastric ulcers completely healed.   KNEE SURGERY     LIVER BIOPSY  10/09/2018   TONSILLECTOMY     TUBAL LIGATION     Social History   Social History Narrative   Son in [place], granddaughter in [place]   Very socially active   Immunization History  Administered Date(s) Administered   DTaP 10/28/2014   Fluad Quad(high Dose 65+) 10/13/2021   Hepatitis A 04/03/2016   Influenza, High Dose Seasonal PF 10/28/2014, 10/11/2015, 10/10/2016, 10/14/2017, 03/20/2018, 10/17/2018, 03/23/2019, 10/27/2019, 09/05/2020    Influenza,inj,Quad PF,6+ Mos 10/18/2014   Influenza,inj,quad, With Preservative 10/09/2018   Influenza-Unspecified 12/09/2012, 11/09/2013   Moderna Covid-19 Fall Seasonal Vaccine 89yrs & older 12/01/2021   Moderna Covid-19 Vaccine Bivalent Booster 4yrs & up 10/10/2020   Moderna Sars-Covid-2 Vaccination 02/20/2019, 03/21/2019, 03/23/2019, 09/25/2019   PNEUMOCOCCAL CONJUGATE-20 09/29/2020   Pneumococcal Conjugate-13 12/09/2013   Pneumococcal Polysaccharide-23 05/04/2011, 10/22/2013, 10/28/2014, 03/20/2018, 10/27/2019   Pneumococcal-Unspecified 09/28/2020   Respiratory Syncytial Virus Vaccine,Recomb Aduvanted(Arexvy) 01/02/2022   Tdap 06/09/2003, 06/09/2013   Typhoid Live 04/03/2016   Zoster Recombinant(Shingrix) 11/21/2020, 03/07/2021   Zoster, Live 04/21/2010     Objective: Vital Signs: There were no vitals taken for this visit.   Physical Exam   Musculoskeletal Exam: ***  CDAI Exam: CDAI Score: -- Patient Global: --; Provider Global: -- Swollen: --; Tender: -- Joint Exam 01/31/2023   No joint exam has been documented for this visit   There is currently no information documented on the homunculus. Go to the Rheumatology activity and complete the homunculus joint exam.  Investigation: No additional findings.  Imaging: No results found.  Recent Labs: Lab Results  Component Value Date   WBC 5.4 09/06/2022   HGB 14.5 09/06/2022   PLT 211 09/06/2022   NA 141 09/06/2022   K 4.0 09/06/2022   CL 106 09/06/2022   CO2 23 09/06/2022   GLUCOSE 81 09/06/2022   BUN 11 09/06/2022   CREATININE 0.84 09/06/2022   BILITOT 0.4 09/06/2022   ALKPHOS 64 09/06/2022   AST 21 09/06/2022   ALT 14 09/06/2022   PROT 5.8 (L) 09/06/2022   ALBUMIN 4.0 09/06/2022   CALCIUM 9.3 09/06/2022   GFRAA 84 09/03/2019    Speciality Comments: Imuran-discontinued due to cholestatic liver injury and upper GI side effects.  Procedures:  No procedures performed Allergies: Azathioprine, Statins,  Sulfa antibiotics, Methocarbamol, Naproxen, Lipitor [atorvastatin], Premarin [conjugated estrogens], Ampicillin, Avelox [moxifloxacin hcl in nacl], Azithromycin, Bactrim [sulfamethoxazole-trimethoprim], Crestor [rosuvastatin calcium], Doxycycline, Levaquin [levofloxacin in d5w], Livalo [pitavastatin], Macrodantin [nitrofurantoin macrocrystal], and Trental [pentoxifylline er]   Assessment / Plan:     Visit Diagnoses: No diagnosis found.  Orders: No orders of the defined types were placed in this encounter.  No orders of the defined types were placed in this encounter.   Face-to-face time spent with patient was *** minutes. Greater than 50% of time was spent in counseling and coordination of care.  Follow-Up Instructions: No follow-ups on file.   Pollyann Savoy, MD  Note - This record has been created using Animal nutritionist.  Chart creation errors have been sought, but may not always  have been located. Such creation errors do not reflect on  the standard of medical care.

## 2023-01-24 ENCOUNTER — Ambulatory Visit: Payer: Medicare Other | Attending: Family | Admitting: Audiology

## 2023-01-24 DIAGNOSIS — H9311 Tinnitus, right ear: Secondary | ICD-10-CM | POA: Diagnosis not present

## 2023-01-24 DIAGNOSIS — H90A31 Mixed conductive and sensorineural hearing loss, unilateral, right ear with restricted hearing on the contralateral side: Secondary | ICD-10-CM | POA: Diagnosis not present

## 2023-01-24 DIAGNOSIS — H90A22 Sensorineural hearing loss, unilateral, left ear, with restricted hearing on the contralateral side: Secondary | ICD-10-CM | POA: Insufficient documentation

## 2023-01-24 NOTE — Procedures (Signed)
Outpatient Audiology and [facility] [address] [place], [place]  [address] [phone]  AUDIOLOGICAL  EVALUATION  NAME: Cheryl Perry     DOB:   1945-06-07      MRN: [id number]                                                                                     DATE: 01/24/2023     REFERENT: Junie Spencer, FNP STATUS: Outpatient DIAGNOSIS: Sensorineural hearing loss, left ear and a mixed hearing loss, right ear  History: Cheryl Perry was seen for an audiological evaluation due to decreased hearing sensitivity and for hearing aids.  Cheryl Perry has a history of a right stapedectomy in 1986.  She was previously followed by Dr. Lovey Newcomer, otolaryngologist.  Cheryl Perry reports a significant history of right sided tinnitus.  She reports her tinnitus is really bothersome and has used coping strategies to manage her tinnitus.  She denies aural fullness, otalgia, and dizziness.  Cheryl Perry reports increased difficulty hearing and communicating with her husband and is interested in pursuing hearing aids.  Cheryl Perry was last seen for an audiological evaluation at [facility] on August 21, 2016.  Audiometric testing in the left ear showed a mild hearing loss sloping to a severe sensorineural hearing loss and testing showed a severe to profound mixed hearing loss in the right ear.   Patient Active Problem List   Diagnosis Date Noted   History of breast cancer 03/07/2021   Autoimmune hepatitis (HCC) 01/12/2019   Elevated liver function tests 12/25/2017   H/O adenomatous polyp of colon 12/12/2017   Back pain 12/14/2016   Overweight (BMI 25.0-29.9) 06/14/2015   Gastric ulceration    Hiatal hernia    History of lower GI bleeding    UGI bleed 01/27/2015   GERD (gastroesophageal reflux disease) 06/15/2014   Osteopenia 09/02/2013   Hypothyroidism 06/05/2012   HLD (hyperlipidemia) 06/05/2012   Asthma 06/05/2012    Evaluation:  Otoscopy showed a clear view of the tympanic membrane in the left ear and  nonoccluding cerumen in the right ear. Tympanometry results were consistent with middle ear pressure and normal tympanic membrane mobility in the left ear (Type A) and normal middle ear pressure and a hypermobile tympanic membrane in the right ear (Type Ad).  Audiometric testing was completed using Conventional Audiometry techniques with insert earphones and TDH headphones. Test results are consistent in the left ear with a mild sloping to severe sensorineural hearing loss consistent in the right ear with a moderate sloping to profound mixed hearing loss.  There is an asymmetry noted at 250 to 8000 Hz, worse in the right ear. Speech Recognition Thresholds were obtained at 85 dB HL in the right ear and at 35 dB HL in the left ear. Word Recognition Testing was completed at 100 dB HL and Cheryl Perry scored 68%.  Word recognition testing was attempted at the lateral level in the right ear however further cannot tolerate the loudness level.  Word recognition testing was completed at 75 dB HL and Cheryl Perry scored 96% in the left ear.  Asymmetric word recognition is noted.    Results:  The test results were reviewed with Cheryl Perry.  Today's test results are consistent in the left ear with a mild sloping to severe sensorineural hearing loss consistent in the right ear with a moderate sloping to profound mixed hearing loss.  There is an asymmetry noted at 250 to 8000 Hz, worse in the right ear.  Cheryl Perry will have hearing and communication difficulty in all listening environments.  She will benefit from the use of amplification and the use of good communication strategies.  Cheryl Perry was encouraged to reach out to her insurance provider inquiring if she has a hearing aid benefit.  Cheryl Perry was given a handout on hearing aid providers in the [place] area.  Cheryl Perry was given a copy of her audiogram today.  A referral to an ENT was reviewed with Cheryl Perry due to her history of a right stapedectomy and for cerumen removal in the right  ear.  Recommendations: 1.   Referral to an ENT to reestablish care for history of right stapedectomy and for right cerumen removal 2.    Communication needs assessment with an audiologist for hearing aids 3.   Monitor hearing sensitivity as needed.  45 minutes spent testing and counseling on results.   If you have any questions please feel free to contact me at [phone].  Marton Redwood Audiologist, Au.D., CCC-A 01/24/2023  1:40 PM  Cc: Junie Spencer, FNP

## 2023-01-29 NOTE — Progress Notes (Signed)
 Office Visit Note  Patient: Cheryl Perry             Date of Birth: July 18, 1945           MRN: [id number]             PCP: Lavell Bari LABOR, FNP Referring: Lavell Bari LABOR, FNP Visit Date: 02/12/2023 Occupation: @GUAROCC @  Subjective:  Right knee joint pain   History of Present Illness: Cheryl Perry is a 78 y.o. female with history of osteoarthritis and DDD.   Patient presents today with increased pain involving the right knee.  She has no difficulty climbing up stairs but has had increased difficulty going down steps.  Patient had a right knee joint cortisone injection performed on 08/01/2022 which righted significant relief but her symptoms have recurred.  She has requested a repeat right knee joint cortisone injection today.  Patient has been taking Flexeril and Tylenol  in the evenings for symptomatic relief. Patient remains on cellcept  daily as prescribed for management of autoimmune hepatitis.  She has been following up with GI every 6 months.  Activities of Daily Living:  Patient reports morning stiffness for 30 minutes.   Patient Reports nocturnal pain.  Difficulty dressing/grooming: Denies Difficulty climbing stairs: Reports Difficulty getting out of chair: Reports Difficulty using hands for taps, buttons, cutlery, and/or writing: Reports  Review of Systems  Constitutional:  Positive for fatigue.  HENT:  Negative for mouth sores, mouth dryness and nose dryness.   Eyes:  Negative for pain and dryness.  Respiratory:  Negative for shortness of breath and difficulty breathing.   Cardiovascular:  Negative for chest pain and palpitations.  Gastrointestinal:  Negative for blood in stool, constipation and diarrhea.  Endocrine: Negative for increased urination.  Genitourinary:  Negative for involuntary urination.  Musculoskeletal:  Positive for joint pain, joint pain, myalgias, muscle weakness, morning stiffness, muscle tenderness and myalgias. Negative for joint swelling.   Skin:  Negative for color change, rash, hair loss and sensitivity to sunlight.  Allergic/Immunologic: Negative for susceptible to infections.  Neurological:  Negative for dizziness and headaches.  Hematological:  Negative for swollen glands.  Psychiatric/Behavioral:  Negative for depressed mood and sleep disturbance. The patient is not nervous/anxious.     PMFS History:  Patient Active Problem List   Diagnosis Date Noted   History of breast cancer 03/07/2021   Autoimmune hepatitis (HCC) 01/12/2019   Elevated liver function tests 12/25/2017   H/O adenomatous polyp of colon 12/12/2017   Back pain 12/14/2016   Overweight (BMI 25.0-29.9) 06/14/2015   Gastric ulceration    Hiatal hernia    History of lower GI bleeding    UGI bleed 01/27/2015   GERD (gastroesophageal reflux disease) 06/15/2014   Osteopenia 09/02/2013   Hypothyroidism 06/05/2012   HLD (hyperlipidemia) 06/05/2012   Asthma 06/05/2012    Past Medical History:  Diagnosis Date   Allergy    Arthritis    back painherniated disc lumbar   Asthma    Autoimmune hepatitis (HCC)    Back pain    Cancer (HCC) 07/2017   right breast cancer, lumpectomy and 20 sessions of XRT   Complication of anesthesia    GERD (gastroesophageal reflux disease)    Hiatal hernia    Hyperlipidemia    Hypothyroidism    Osteopenia    PONV (postoperative nausea and vomiting)    asks for scop patch   Thyroid  disease    Vasculopathy    lymphatic    Family History  Problem Relation Age of Onset   Stroke Mother    Hypertension Mother    Alzheimer's disease Mother    Heart disease Sister    Hypertension Sister    Heart attack Sister        pace maker    Arthritis Daughter        very bad MVA    Hypertension Son    Seizures Son    Colon cancer Neg Hx    Liver cancer Neg Hx    Pancreatic disease Neg Hx    Past Surgical History:  Procedure Laterality Date   BASAL CELL CARCINOMA EXCISION     BREAST LUMPECTOMY WITH RADIOACTIVE SEED AND  SENTINEL LYMPH NODE BIOPSY Right 08/22/2017   Procedure: BREAST LUMPECTOMY WITH RADIOACTIVE SEED AND SENTINEL LYMPH NODE BIOPSY;  Surgeon: Vanderbilt Ned, MD;  Location: [facility];  Service: General;  Laterality: Right;   CATARACT EXTRACTION     COLONOSCOPY  2015   Dr. Luis: Hemorrhoids, diverticulosis.  Next colonoscopy in 5 years due to previous history of adenomatous colon polyps   COLONOSCOPY N/A 11/18/2019   Procedure: COLONOSCOPY;  Surgeon: Shaaron Lamar HERO, MD;  Location: [facility];  Service: Endoscopy;  Laterality: N/A;  9:30   CYSTOSCOPY     cystscopy     ESOPHAGOGASTRODUODENOSCOPY N/A 01/28/2015   Dr.Rourk- 3 small prepyloric/gastric ulcers- likely culprits causing bleeding. hiatal hernia, small gastic polyps not manipulated.   ESOPHAGOGASTRODUODENOSCOPY N/A 05/26/2015   Dr. Shaaron: Small hiatal hernia, previous gastric ulcers completely healed.   KNEE SURGERY     LIVER BIOPSY  10/09/2018   TONSILLECTOMY     TUBAL LIGATION     Social History   Social History Narrative   Son in [place], granddaughter in [place]   Very socially active   Immunization History  Administered Date(s) Administered   DTaP 10/28/2014   Fluad Quad(high Dose 65+) 10/13/2021   Hepatitis A 04/03/2016   Influenza, High Dose Seasonal PF 10/28/2014, 10/11/2015, 10/10/2016, 10/14/2017, 03/20/2018, 10/17/2018, 03/23/2019, 10/27/2019, 09/05/2020   Influenza,inj,Quad PF,6+ Mos 10/18/2014   Influenza,inj,quad, With Preservative 10/09/2018   Influenza-Unspecified 12/09/2012, 11/09/2013   Moderna Covid-19 Fall Seasonal Vaccine 16yrs & older 12/01/2021   Moderna Covid-19 Vaccine Bivalent Booster 56yrs & up 10/10/2020   Moderna Sars-Covid-2 Vaccination 02/20/2019, 03/21/2019, 03/23/2019, 09/25/2019   PNEUMOCOCCAL CONJUGATE-20 09/29/2020   Pneumococcal Conjugate-13 12/09/2013   Pneumococcal Polysaccharide-23 05/04/2011, 10/22/2013, 10/28/2014, 03/20/2018, 10/27/2019    Pneumococcal-Unspecified 09/28/2020   Respiratory Syncytial Virus Vaccine,Recomb Aduvanted(Arexvy) 01/02/2022   Tdap 06/09/2003, 06/09/2013   Typhoid Live 04/03/2016   Zoster Recombinant(Shingrix) 11/21/2020, 03/07/2021   Zoster, Live 04/21/2010     Objective: Vital Signs: BP 125/81 (BP Location: Left Arm, Patient Position: Sitting, Cuff Size: Normal)   Pulse 65   Resp 16   Ht 5' 5.5 (1.664 m)   Wt 168 lb (76.2 kg)   BMI 27.53 kg/m    Physical Exam Vitals and nursing note reviewed.  Constitutional:      Appearance: She is well-developed.  HENT:     Head: Normocephalic and atraumatic.  Eyes:     Conjunctiva/sclera: Conjunctivae normal.  Cardiovascular:     Rate and Rhythm: Normal rate and regular rhythm.     Heart sounds: Normal heart sounds.  Pulmonary:     Effort: Pulmonary effort is normal.     Breath sounds: Normal breath sounds.  Abdominal:     General: Bowel sounds are normal.     Palpations: Abdomen is soft.  Musculoskeletal:  Cervical back: Normal range of motion.  Lymphadenopathy:     Cervical: No cervical adenopathy.  Skin:    General: Skin is warm and dry.     Capillary Refill: Capillary refill takes less than 2 seconds.  Neurological:     Mental Status: She is alert and oriented to person, place, and time.  Psychiatric:        Behavior: Behavior normal.      Musculoskeletal Exam: C-spine is limited range of motion.  Thoracic kyphosis noted.  Limited mobility of the lumbar spine.  Shoulder joints, elbow joints, wrist joints, MCPs, PIPs, DIPs have good range of motion with no synovitis.  PIP and DIP thickening consistent with osteoarthritis of both hands.  Subluxation of several PIP and DIP joints.  Hip joints have good range of motion with no groin pain.  Discomfort with range of motion of her right knee but no warmth or effusion noted.  Left knee joint has good range of motion with no warmth or effusion.  Ankle joints have good range of motion with no  tenderness or joint swelling.  CDAI Exam: CDAI Score: -- Patient Global: --; Provider Global: -- Swollen: --; Tender: -- Joint Exam 02/12/2023   No joint exam has been documented for this visit   There is currently no information documented on the homunculus. Go to the Rheumatology activity and complete the homunculus joint exam.  Investigation: No additional findings.  Imaging: No results found.  Recent Labs: Lab Results  Component Value Date   WBC 5.4 09/06/2022   HGB 14.5 09/06/2022   PLT 211 09/06/2022   NA 141 09/06/2022   K 4.0 09/06/2022   CL 106 09/06/2022   CO2 23 09/06/2022   GLUCOSE 81 09/06/2022   BUN 11 09/06/2022   CREATININE 0.84 09/06/2022   BILITOT 0.4 09/06/2022   ALKPHOS 64 09/06/2022   AST 21 09/06/2022   ALT 14 09/06/2022   PROT 5.8 (L) 09/06/2022   ALBUMIN 4.0 09/06/2022   CALCIUM  9.3 09/06/2022   GFRAA 84 09/03/2019    Speciality Comments: Imuran-discontinued due to cholestatic liver injury and upper GI side effects.  Procedures:  Large Joint Inj: R knee on 02/12/2023 2:27 PM Indications: pain Details: 27 G 1.5 in needle, medial approach  Arthrogram: No  Medications: 1.5 mL lidocaine  1 %; 40 mg triamcinolone  acetonide 40 MG/ML Aspirate: 0 mL Outcome: tolerated well, no immediate complications Procedure, treatment alternatives, risks and benefits explained, specific risks discussed. Consent was given by the patient. Immediately prior to procedure a time out was called to verify the correct patient, procedure, equipment, support staff and site/side marked as required. Patient was prepped and draped in the usual sterile fashion.     Allergies: Azathioprine, Statins, Sulfa antibiotics, Methocarbamol , Naproxen , Lipitor [atorvastatin], Premarin [conjugated estrogens], Ampicillin, Avelox [moxifloxacin hcl in nacl], Azithromycin , Bactrim [sulfamethoxazole-trimethoprim], Crestor  [rosuvastatin  calcium ], Doxycycline , Levaquin [levofloxacin in d5w],  Livalo  [pitavastatin ], Macrodantin [nitrofurantoin macrocrystal], and Trental [pentoxifylline er]   CBC and CMP updated on 09/06/22.   Assessment / Plan:     Visit Diagnoses: Primary osteoarthritis of both hands: She has PIP and DIP thickening consistent with osteoarthritis of both hands.  Subluxation of several PIP and DIP joints consistent with osteoarthritic changes.  No synovitis was noted.  Discussed the importance of joint protection and muscle strengthening.  Encouraged patient to perform hand exercises.  Chronic pain of right knee: Patient presents today with a recurrence of pain in the right knee.  She has not had any recent  injury or fall.  She has been having some increased discomfort as well as instability when going down steps but has otherwise not had any mechanical symptoms.  She had a right knee joint cortisone injection on 08/01/2022 which righted significant relief but her symptoms have started to recur.  She has requested a repeat right knee joint cortisone injection today.  She tolerated procedure well.  Procedure note was completed above.  Aftercare was discussed.  She is advised to notify us  if her symptoms persist or worsen.  Chondrocalcinosis of right knee - X-rays showed mild osteoarthritis, moderate chondromalacia patella and chondrocalcinosis.  Patient had a right knee joint cortisone injection performed on 08/01/2022 which righted significant relief.  Her symptoms have gradually started to return.  On examination today she has discomfort with range of motion but no effusion was noted.  Patient requested a repeat right knee joint cortisone injection.  She tolerated the procedure well.  Procedure note was completed above.  Aftercare was discussed.  Primary osteoarthritis of both feet: She is not experiencing any increased discomfort in her feet at this time.  Good ROM of both ankles with no tenderness or joint swelling.   DDD (degenerative disc disease), cervical: She has limited  ROM of the C-spine.    DDD (degenerative disc disease), thoracic: Thoracic kyphosis noted.   Degeneration of intervertebral disc of lumbar region without discogenic back pain or lower extremity pain: Limited mobility.   Osteopenia of multiple sites - Previous DEXA 03/07/20 T score -2.3 RFN.   Patient had an updated DEXA on 09/06/2022: Lumbar spine BMD 1.026 with T-score -1.3.  Left femoral neck T-score -1.5, right femoral neck BMD 0.709 with T-score -2.4. She is taking a calcium  and vitamin D  supplement daily.  Autoimmune hepatitis (HCC) - Bx positive on 10/02/18.  She is followed by Dr. Berta at [facility].  She remains on Cellcept  500 mg 1 tablet by mouth twice daily. LFTs were within normal limits on 09/06/2022.  She continues to follow-up with GI every 6 months.  High risk medication use - CellCept  500 mg 1 tablet p.o. twice daily prescribed by GI.  CBC and CMP were updated on 09/06/2022.  Positive ANA (antinuclear antibody) - AVISE index -0.4, ANA titer negative, RF 18, TPO antibody positive.  She has no other clinical features of autoimmune disease.  Vitamin D  deficiency: She is taking vitamin D  2000 units daily.  Other medical conditions are listed as follows:  Malignant neoplasm of upper-outer quadrant of right breast in female, estrogen receptor positive (HCC)  History of hyperlipidemia  H/O adenomatous polyp of colon  History of asthma  History of gastroesophageal reflux (GERD)  History of lower GI bleeding  Hiatal hernia  History of hypothyroidism  Orders: Orders Placed This Encounter  Procedures   Large Joint Inj   No orders of the defined types were placed in this encounter.    Follow-Up Instructions: Return in about 6 months (around 08/12/2023) for Osteoarthritis, DDD.   Cheryl CHRISTELLA Craze, PA-C  Note - This record has been created using Dragon software.  Chart creation errors have been sought, but may not always  have been located. Such creation  errors do not reflect on  the standard of medical care.

## 2023-01-31 ENCOUNTER — Ambulatory Visit: Payer: Medicare Other | Admitting: Rheumatology

## 2023-01-31 DIAGNOSIS — M5030 Other cervical disc degeneration, unspecified cervical region: Secondary | ICD-10-CM

## 2023-01-31 DIAGNOSIS — K449 Diaphragmatic hernia without obstruction or gangrene: Secondary | ICD-10-CM

## 2023-01-31 DIAGNOSIS — E559 Vitamin D deficiency, unspecified: Secondary | ICD-10-CM

## 2023-01-31 DIAGNOSIS — R768 Other specified abnormal immunological findings in serum: Secondary | ICD-10-CM

## 2023-01-31 DIAGNOSIS — Z170 Estrogen receptor positive status [ER+]: Secondary | ICD-10-CM

## 2023-01-31 DIAGNOSIS — M5134 Other intervertebral disc degeneration, thoracic region: Secondary | ICD-10-CM

## 2023-01-31 DIAGNOSIS — K754 Autoimmune hepatitis: Secondary | ICD-10-CM

## 2023-01-31 DIAGNOSIS — Z79899 Other long term (current) drug therapy: Secondary | ICD-10-CM

## 2023-01-31 DIAGNOSIS — M11261 Other chondrocalcinosis, right knee: Secondary | ICD-10-CM

## 2023-01-31 DIAGNOSIS — M19071 Primary osteoarthritis, right ankle and foot: Secondary | ICD-10-CM

## 2023-01-31 DIAGNOSIS — M19041 Primary osteoarthritis, right hand: Secondary | ICD-10-CM

## 2023-01-31 DIAGNOSIS — Z8639 Personal history of other endocrine, nutritional and metabolic disease: Secondary | ICD-10-CM

## 2023-01-31 DIAGNOSIS — Z860101 Personal history of adenomatous and serrated colon polyps: Secondary | ICD-10-CM

## 2023-01-31 DIAGNOSIS — Z8719 Personal history of other diseases of the digestive system: Secondary | ICD-10-CM

## 2023-01-31 DIAGNOSIS — G8929 Other chronic pain: Secondary | ICD-10-CM

## 2023-01-31 DIAGNOSIS — Z8709 Personal history of other diseases of the respiratory system: Secondary | ICD-10-CM

## 2023-01-31 DIAGNOSIS — M51369 Other intervertebral disc degeneration, lumbar region without mention of lumbar back pain or lower extremity pain: Secondary | ICD-10-CM

## 2023-02-12 ENCOUNTER — Ambulatory Visit: Payer: Medicare Other | Attending: Physician Assistant | Admitting: Physician Assistant

## 2023-02-12 ENCOUNTER — Encounter: Payer: Self-pay | Admitting: Physician Assistant

## 2023-02-12 VITALS — BP 125/81 | HR 65 | Resp 16 | Ht 65.5 in | Wt 168.0 lb

## 2023-02-12 DIAGNOSIS — M8589 Other specified disorders of bone density and structure, multiple sites: Secondary | ICD-10-CM | POA: Diagnosis not present

## 2023-02-12 DIAGNOSIS — Z170 Estrogen receptor positive status [ER+]: Secondary | ICD-10-CM | POA: Diagnosis not present

## 2023-02-12 DIAGNOSIS — M11261 Other chondrocalcinosis, right knee: Secondary | ICD-10-CM | POA: Diagnosis not present

## 2023-02-12 DIAGNOSIS — C50411 Malignant neoplasm of upper-outer quadrant of right female breast: Secondary | ICD-10-CM | POA: Insufficient documentation

## 2023-02-12 DIAGNOSIS — M19072 Primary osteoarthritis, left ankle and foot: Secondary | ICD-10-CM | POA: Diagnosis not present

## 2023-02-12 DIAGNOSIS — M5030 Other cervical disc degeneration, unspecified cervical region: Secondary | ICD-10-CM | POA: Diagnosis not present

## 2023-02-12 DIAGNOSIS — M51369 Other intervertebral disc degeneration, lumbar region without mention of lumbar back pain or lower extremity pain: Secondary | ICD-10-CM | POA: Diagnosis not present

## 2023-02-12 DIAGNOSIS — Z860101 Personal history of adenomatous and serrated colon polyps: Secondary | ICD-10-CM | POA: Diagnosis not present

## 2023-02-12 DIAGNOSIS — G8929 Other chronic pain: Secondary | ICD-10-CM | POA: Insufficient documentation

## 2023-02-12 DIAGNOSIS — E559 Vitamin D deficiency, unspecified: Secondary | ICD-10-CM | POA: Insufficient documentation

## 2023-02-12 DIAGNOSIS — M25561 Pain in right knee: Secondary | ICD-10-CM | POA: Diagnosis not present

## 2023-02-12 DIAGNOSIS — M19041 Primary osteoarthritis, right hand: Secondary | ICD-10-CM | POA: Insufficient documentation

## 2023-02-12 DIAGNOSIS — Z8709 Personal history of other diseases of the respiratory system: Secondary | ICD-10-CM | POA: Diagnosis not present

## 2023-02-12 DIAGNOSIS — K754 Autoimmune hepatitis: Secondary | ICD-10-CM | POA: Diagnosis not present

## 2023-02-12 DIAGNOSIS — M5134 Other intervertebral disc degeneration, thoracic region: Secondary | ICD-10-CM | POA: Diagnosis not present

## 2023-02-12 DIAGNOSIS — Z79899 Other long term (current) drug therapy: Secondary | ICD-10-CM | POA: Diagnosis not present

## 2023-02-12 DIAGNOSIS — Z8639 Personal history of other endocrine, nutritional and metabolic disease: Secondary | ICD-10-CM | POA: Diagnosis not present

## 2023-02-12 DIAGNOSIS — R768 Other specified abnormal immunological findings in serum: Secondary | ICD-10-CM | POA: Insufficient documentation

## 2023-02-12 DIAGNOSIS — M19071 Primary osteoarthritis, right ankle and foot: Secondary | ICD-10-CM | POA: Diagnosis not present

## 2023-02-12 DIAGNOSIS — K449 Diaphragmatic hernia without obstruction or gangrene: Secondary | ICD-10-CM | POA: Diagnosis not present

## 2023-02-12 DIAGNOSIS — Z8719 Personal history of other diseases of the digestive system: Secondary | ICD-10-CM | POA: Insufficient documentation

## 2023-02-12 DIAGNOSIS — M19042 Primary osteoarthritis, left hand: Secondary | ICD-10-CM | POA: Diagnosis not present

## 2023-02-12 MED ORDER — LIDOCAINE HCL 1 % IJ SOLN
1.5000 mL | INTRAMUSCULAR | Status: AC | PRN
  Administered 2023-02-12: 1.5 mL

## 2023-02-12 MED ORDER — TRIAMCINOLONE ACETONIDE 40 MG/ML IJ SUSP
40.0000 mg | INTRAMUSCULAR | Status: AC | PRN
  Administered 2023-02-12: 40 mg via INTRA_ARTICULAR

## 2023-03-08 ENCOUNTER — Encounter: Payer: Self-pay | Admitting: Family

## 2023-03-08 ENCOUNTER — Ambulatory Visit: Payer: Medicare Other | Admitting: Family

## 2023-03-08 VITALS — BP 143/80 | HR 60 | Temp 97.0°F | Ht 65.5 in | Wt 166.0 lb

## 2023-03-08 DIAGNOSIS — M8589 Other specified disorders of bone density and structure, multiple sites: Secondary | ICD-10-CM | POA: Diagnosis not present

## 2023-03-08 DIAGNOSIS — E038 Other specified hypothyroidism: Secondary | ICD-10-CM | POA: Diagnosis not present

## 2023-03-08 DIAGNOSIS — G8929 Other chronic pain: Secondary | ICD-10-CM | POA: Diagnosis not present

## 2023-03-08 DIAGNOSIS — J4531 Mild persistent asthma with (acute) exacerbation: Secondary | ICD-10-CM

## 2023-03-08 DIAGNOSIS — M545 Low back pain, unspecified: Secondary | ICD-10-CM | POA: Diagnosis not present

## 2023-03-08 DIAGNOSIS — K754 Autoimmune hepatitis: Secondary | ICD-10-CM | POA: Diagnosis not present

## 2023-03-08 DIAGNOSIS — E663 Overweight: Secondary | ICD-10-CM

## 2023-03-08 DIAGNOSIS — K219 Gastro-esophageal reflux disease without esophagitis: Secondary | ICD-10-CM | POA: Diagnosis not present

## 2023-03-08 DIAGNOSIS — E785 Hyperlipidemia, unspecified: Secondary | ICD-10-CM | POA: Diagnosis not present

## 2023-03-08 MED ORDER — LEVOTHYROXINE SODIUM 75 MCG PO TABS: 75.0000 ug | ORAL_TABLET | Freq: Every day | ORAL | 1 refills | Status: DC

## 2023-03-08 MED ORDER — MYCOPHENOLATE MOFETIL 500 MG PO TABS: 500.0000 mg | ORAL_TABLET | Freq: Two times a day (BID) | ORAL | 0 refills | Status: DC

## 2023-03-08 NOTE — Progress Notes (Signed)
 Subjective:    Patient ID: Cheryl Perry, female    DOB: 1945-09-22, 78 y.o.   MRN: [id number]  Chief Complaint  Patient presents with   Medical Management of Chronic Issues    Fatigue    Pt presents to the office today for chronic follow up.  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 She is currently taking Cellcept  and doing well.    Pt is followed by allergen specialists annually for her asthma. PT is followed by Ortho as needed for chronic back pain and left knee pain.   She has history of right breast cancer. She completed radiation in 10/29/17.    She has osteopenia and takes calcium and vit D. She walking 30 mins six days a week.   Asthma She complains of hoarse voice and wheezing. There is no cough or shortness of breath. This is a chronic problem. The current episode started more than 1 year ago. The problem occurs intermittently. Associated symptoms include heartburn. Her symptoms are aggravated by pollen. Her symptoms are alleviated by rest. She reports moderate improvement on treatment. Her past medical history is significant for asthma.  Gastroesophageal Reflux She complains of belching, heartburn, a hoarse voice and wheezing. She reports no coughing. This is a chronic problem. The current episode started more than 1 year ago. The problem occurs occasionally. Associated symptoms include fatigue. Risk factors include obesity. She has tried a PPI for the symptoms. The treatment provided moderate relief.  Thyroid Problem Presents for follow-up visit. Symptoms include dry skin, fatigue, hair loss and hoarse voice. Patient reports no anxiety, constipation or diaphoresis. The symptoms have been stable.  Hyperlipidemia This is a chronic problem. The current episode started more than 1 year ago. The problem is uncontrolled. Exacerbating diseases include obesity. Pertinent negatives include no  shortness of breath. Current antihyperlipidemic treatment includes diet change. The current treatment provides mild improvement of lipids. Risk factors for coronary artery disease include dyslipidemia, hypertension, a sedentary lifestyle and post-menopausal.  Back Pain This is a chronic problem. The current episode started more than 1 year ago. The problem occurs intermittently. The problem has been waxing and waning since onset. The pain is present in the lumbar spine. The quality of the pain is described as aching. The pain is at a severity of 1/10. The pain is mild. She has tried home exercises for the symptoms. The treatment provided mild relief.      Review of Systems  Constitutional:  Positive for fatigue. Negative for diaphoresis.  HENT:  Positive for hoarse voice.   Respiratory:  Positive for wheezing. Negative for cough and shortness of breath.   Gastrointestinal:  Positive for heartburn. Negative for constipation.  Musculoskeletal:  Positive for back pain.  Psychiatric/Behavioral:  The patient is not nervous/anxious.   All other systems reviewed and are negative.      Objective:   Physical Exam Vitals reviewed.  Constitutional:      General: She is not in acute distress.    Appearance: She is well-developed. She is obese.  HENT:     Head: Normocephalic and atraumatic.     Right Ear: There is impacted cerumen.     Left Ear: Tympanic membrane normal.  Eyes:     Pupils: Pupils are equal, round, and reactive to light.  Neck:     Thyroid: No thyromegaly.  Cardiovascular:     Rate and  Rhythm: Normal rate and regular rhythm.     Heart sounds: Normal heart sounds. No murmur heard. Pulmonary:     Effort: Pulmonary effort is normal. No respiratory distress.     Breath sounds: Normal breath sounds. No wheezing.  Abdominal:     General: Bowel sounds are normal. There is no distension.     Palpations: Abdomen is soft.     Tenderness: There is no abdominal tenderness.   Musculoskeletal:        General: No tenderness. Normal range of motion.     Cervical back: Normal range of motion and neck supple.  Skin:    General: Skin is warm and dry.  Neurological:     Mental Status: She is alert and oriented to person, place, and time.     Cranial Nerves: No cranial nerve deficit.     Deep Tendon Reflexes: Reflexes are normal and symmetric.  Psychiatric:        Behavior: Behavior normal.        Thought Content: Thought content normal.        Judgment: Judgment normal.          BP (!) 160/90   Pulse 60   Temp (!) 97 F (36.1 C) (Temporal)   Ht 5' 5.5" (1.664 m)   Wt 166 lb (75.3 kg)   SpO2 96%   BMI 27.20 kg/m   Assessment & Plan:   HALLEY SHEPHEARD comes in today with chief complaint of Medical Management of Chronic Issues (Fatigue )   Diagnosis and orders addressed:  1. Other specified hypothyroidism (Primary) - BMP8+EGFR - CBC with Differential/Platelet - TSH - levothyroxine (SYNTHROID) 75 MCG tablet; Take 1 tablet (75 mcg total) by mouth daily before breakfast.  Dispense: 90 tablet; Refill: 1  2. Hyperlipidemia, unspecified hyperlipidemia type - BMP8+EGFR - CBC with Differential/Platelet  3. Gastroesophageal reflux disease without esophagitis - BMP8+EGFR - CBC with Differential/Platelet  4. Mild persistent asthma with acute exacerbation - BMP8+EGFR - CBC with Differential/Platelet  5. Autoimmune hepatitis (HCC) - mycophenolate (CELLCEPT) 500 MG tablet; Take 1 tablet (500 mg total) by mouth 2 (two) times daily. Patient takes 500 mg in the morning and 500 mg at night.  Dispense: 60 tablet; Refill: 0 - BMP8+EGFR - CBC with Differential/Platelet - Hepatic function panel  6. Chronic bilateral low back pain without sciatica - BMP8+EGFR - CBC with Differential/Platelet  7. Osteopenia of multiple sites - BMP8+EGFR - CBC with Differential/Platelet  8. Overweight (BMI 25.0-29.9) - BMP8+EGFR - CBC with  Differential/Platelet   Labs pending Continue current medications  Health Maintenance reviewed Diet and exercise encouraged  Follow up plan: 6 months    Jannifer Rodney, FNP

## 2023-03-08 NOTE — Patient Instructions (Signed)
 Health Maintenance After Age 79 After age 4, you are at a higher risk for certain long-term diseases and infections as well as injuries from falls. Falls are a major cause of broken bones and head injuries in people who are older than age 47. Getting regular preventive care can help to keep you healthy and well. Preventive care includes getting regular testing and making lifestyle changes as recommended by your health care provider. Talk with your health care provider about: Which screenings and tests you should have. A screening is a test that checks for a disease when you have no symptoms. A diet and exercise plan that is right for you. What should I know about screenings and tests to prevent falls? Screening and testing are the best ways to find a health problem early. Early diagnosis and treatment give you the best chance of managing medical conditions that are common after age 37. Certain conditions and lifestyle choices may make you more likely to have a fall. Your health care provider may recommend: Regular vision checks. Poor vision and conditions such as cataracts can make you more likely to have a fall. If you wear glasses, make sure to get your prescription updated if your vision changes. Medicine review. Work with your health care provider to regularly review all of the medicines you are taking, including over-the-counter medicines. Ask your health care provider about any side effects that may make you more likely to have a fall. Tell your health care provider if any medicines that you take make you feel dizzy or sleepy. Strength and balance checks. Your health care provider may recommend certain tests to check your strength and balance while standing, walking, or changing positions. Foot health exam. Foot pain and numbness, as well as not wearing proper footwear, can make you more likely to have a fall. Screenings, including: Osteoporosis screening. Osteoporosis is a condition that causes  the bones to get weaker and break more easily. Blood pressure screening. Blood pressure changes and medicines to control blood pressure can make you feel dizzy. Depression screening. You may be more likely to have a fall if you have a fear of falling, feel depressed, or feel unable to do activities that you used to do. Alcohol use screening. Using too much alcohol can affect your balance and may make you more likely to have a fall. Follow these instructions at home: Lifestyle Do not drink alcohol if: Your health care provider tells you not to drink. If you drink alcohol: Limit how much you have to: 0-1 drink a day for women. 0-2 drinks a day for men. Know how much alcohol is in your drink. In the [place] one drink equals one 12 oz bottle of beer (355 mL), one 5 oz glass of wine (148 mL), or one 1 oz glass of hard liquor (44 mL). Do not use any products that contain nicotine or tobacco. These products include cigarettes, chewing tobacco, and vaping devices, such as e-cigarettes. If you need help quitting, ask your health care provider. Activity  Follow a regular exercise program to stay fit. This will help you maintain your balance. Ask your health care provider what types of exercise are appropriate for you. If you need a cane or walker, use it as recommended by your health care provider. Wear supportive shoes that have nonskid soles. Safety  Remove any tripping hazards, such as rugs, cords, and clutter. Install safety equipment such as grab bars in bathrooms and safety rails on stairs. Keep rooms and walkways  well-lit. General instructions Talk with your health care provider about your risks for falling. Tell your health care provider if: You fall. Be sure to tell your health care provider about all falls, even ones that seem minor. You feel dizzy, tiredness (fatigue), or off-balance. Take over-the-counter and prescription medicines only as told by your health care provider. These include  supplements. Eat a healthy diet and maintain a healthy weight. A healthy diet includes low-fat dairy products, low-fat (lean) meats, and fiber from whole grains, beans, and lots of fruits and vegetables. Stay current with your vaccines. Schedule regular health, dental, and eye exams. Summary Having a healthy lifestyle and getting preventive care can help to protect your health and wellness after age 11. Screening and testing are the best way to find a health problem early and help you avoid having a fall. Early diagnosis and treatment give you the best chance for managing medical conditions that are more common for people who are older than age 28. Falls are a major cause of broken bones and head injuries in people who are older than age 48. Take precautions to prevent a fall at home. Work with your health care provider to learn what changes you can make to improve your health and wellness and to prevent falls. This information is not intended to replace advice given to you by your health care provider. Make sure you discuss any questions you have with your health care provider. Document Revised: 05/16/2020 Document Reviewed: 05/16/2020 Elsevier Patient Education  2024 [employer].

## 2023-03-09 LAB — CBC WITH DIFFERENTIAL/PLATELET
Basophils Absolute: 0 10*3/uL (ref 0.0–0.2)
Basos: 1 %
EOS (ABSOLUTE): 0.1 10*3/uL (ref 0.0–0.4)
Eos: 1 %
Hematocrit: 44.3 % (ref 34.0–46.6)
Hemoglobin: 14.6 g/dL (ref 11.1–15.9)
Immature Grans (Abs): 0 10*3/uL (ref 0.0–0.1)
Immature Granulocytes: 0 %
Lymphocytes Absolute: 1 10*3/uL (ref 0.7–3.1)
Lymphs: 16 %
MCH: 31.7 pg (ref 26.6–33.0)
MCHC: 33 g/dL (ref 31.5–35.7)
MCV: 96 fL (ref 79–97)
Monocytes Absolute: 0.6 10*3/uL (ref 0.1–0.9)
Monocytes: 9 %
Neutrophils Absolute: 4.3 10*3/uL (ref 1.4–7.0)
Neutrophils: 73 %
Platelets: 208 10*3/uL (ref 150–450)
RBC: 4.61 x10E6/uL (ref 3.77–5.28)
RDW: 12 % (ref 11.7–15.4)
WBC: 6 10*3/uL (ref 3.4–10.8)

## 2023-03-09 LAB — BMP8+EGFR
BUN/Creatinine Ratio: 14 (ref 12–28)
BUN: 11 mg/dL (ref 8–27)
CO2: 22 mmol/L (ref 20–29)
Calcium: 9.5 mg/dL (ref 8.7–10.3)
Chloride: 105 mmol/L (ref 96–106)
Creatinine, Ser: 0.81 mg/dL (ref 0.57–1.00)
Glucose: 83 mg/dL (ref 70–99)
Potassium: 4.2 mmol/L (ref 3.5–5.2)
Sodium: 143 mmol/L (ref 134–144)
eGFR: 75 mL/min/{1.73_m2} (ref >59)

## 2023-03-09 LAB — HEPATIC FUNCTION PANEL
ALT: 13 IU/L (ref 0–32)
AST: 19 IU/L (ref 0–40)
Albumin: 4.1 g/dL (ref 3.8–4.8)
Alkaline Phosphatase: 68 IU/L (ref 44–121)
Bilirubin Total: 0.4 mg/dL (ref 0.0–1.2)
Bilirubin, Direct: 0.13 mg/dL (ref 0.00–0.40)
Total Protein: 6 g/dL (ref 6.0–8.5)

## 2023-03-09 LAB — TSH: TSH: 1.77 u[IU]/mL (ref 0.450–4.500)

## 2023-03-29 ENCOUNTER — Other Ambulatory Visit: Payer: Self-pay | Admitting: Family Medicine

## 2023-03-29 DIAGNOSIS — K754 Autoimmune hepatitis: Secondary | ICD-10-CM

## 2023-03-29 MED ORDER — MYCOPHENOLATE MOFETIL 500 MG PO TABS: 500.0000 mg | ORAL_TABLET | Freq: Two times a day (BID) | ORAL | 0 refills | Status: DC

## 2023-04-05 MED ORDER — FLUTICASONE-SALMETEROL 115-21 MCG/ACT IN AERO: 2.0000 | INHALATION_SPRAY | Freq: Two times a day (BID) | RESPIRATORY_TRACT | 2 refills | Status: DC

## 2023-04-11 ENCOUNTER — Ambulatory Visit: Payer: Medicare Other

## 2023-04-11 VITALS — BP 143/80 | HR 60 | Ht 65.0 in | Wt 166.0 lb

## 2023-04-11 DIAGNOSIS — Z0000 Encounter for general adult medical examination without abnormal findings: Secondary | ICD-10-CM | POA: Diagnosis not present

## 2023-04-11 NOTE — Patient Instructions (Signed)
 Cheryl Perry , Thank you for taking time to come for your Medicare Wellness Visit. I appreciate your ongoing commitment to your health goals. Please review the following plan we discussed and let me know if I can assist you in the future.   Referrals/Orders/Follow-Ups/Clinician Recommendations: Keep up the good work on your health.   This is a list of the screening recommended for you and due dates:  Health Maintenance  Topic Date Due   COVID-19 Vaccine (7 - 2024-25 season) 04/26/2024*   Mammogram  07/30/2023   Flu Shot  08/09/2023   Medicare Annual Wellness Visit  04/10/2024   DEXA scan (bone density measurement)  09/05/2024   DTaP/Tdap/Td vaccine (4 - Td or Tdap) 10/27/2024   Colon Cancer Screening  11/17/2024   Pneumonia Vaccine  Completed   Hepatitis C Screening  Completed   Zoster (Shingles) Vaccine  Completed   HPV Vaccine  Aged Out  *Topic was postponed. The date shown is not the original due date.    Advanced directives: (Copy Requested) Please bring a copy of your health care power of attorney and living will to the office to be added to your chart at your convenience. You can mail to [facility] [address] [place], [place] [address] or email to [email or web address]  Next Medicare Annual Wellness Visit scheduled for next year: Yes

## 2023-04-11 NOTE — Progress Notes (Signed)
 Subjective:   Cheryl Perry is a 78 y.o. who presents for a Medicare Wellness preventive visit.  Visit Complete: Virtual I connected with  Cheryl Perry on 04/11/23 by a audio enabled telemedicine application and verified that I am speaking with the correct person using two identifiers.  Patient Location: Home  Provider Location: Home Office  I discussed the limitations of evaluation and management by telemedicine. The patient expressed understanding and agreed to proceed.  Vital Signs: Because this visit was a virtual/telehealth visit, some criteria may be missing or patient reported. Any vitals not documented were not able to be obtained and vitals that have been documented are patient reported.  VideoDeclined- This patient declined Librarian, academic. Therefore the visit was completed with audio only.  Persons Participating in Visit: Patient.  AWV Questionnaire: No: Patient Medicare AWV questionnaire was not completed prior to this visit.  Cardiac Risk Factors include: advanced age (>69men, >60 women)     Objective:    Today's Vitals   04/11/23 1019  BP: (!) 143/80  Pulse: 60  Weight: 166 lb (75.3 kg)  Height: 5\' 5"  (1.651 m)   Body mass index is 27.62 kg/m.     04/11/2023   10:34 AM 04/10/2022   10:43 AM 04/06/2021   12:16 PM 04/05/2020    3:43 PM 11/18/2019    8:37 AM 07/10/2019    1:48 PM 12/28/2018    9:39 AM  Advanced Directives  Does Patient Have a Medical Advance Directive? Yes Yes Yes Yes Yes No No  Type of Estate agent of [place];Living will Healthcare Power of [place];Living will Living will;Healthcare Power of [employer] Power of [place];Living will Healthcare Power of [place];Living will    Does patient want to make changes to medical advance directive?    No - Patient declined     Copy of Healthcare Power of Attorney in Chart? No - copy requested No - copy requested No - copy requested No -  copy requested No - copy requested      Current Medications (verified) Outpatient Encounter Medications as of 04/11/2023  Medication Sig   albuterol (PROVENTIL HFA) 108 (90 Base) MCG/ACT inhaler Inhale 2 puffs into the lungs every 6 (six) hours as needed for wheezing. May use only 3 times a month   calcium carbonate (OS-CAL - DOSED IN MG OF ELEMENTAL CALCIUM) 1250 (500 Ca) MG tablet Take 1 tablet by mouth.   calcium carbonate (TUMS - DOSED IN MG ELEMENTAL CALCIUM) 500 MG chewable tablet Chew 1 tablet by mouth 3 (three) times daily as needed for indigestion or heartburn.   Cholecalciferol (VITAMIN D) 2000 UNITS CAPS Take 2,000 Units by mouth daily.    cyclobenzaprine (FLEXERIL) 10 MG tablet take 1 tablet by mouth three times a day as needed for 30 days   famotidine (PEPCID) 20 MG tablet Take 20 mg by mouth as needed.   fluticasone-salmeterol (ADVAIR HFA) 115-21 MCG/ACT inhaler Inhale 2 puffs into the lungs 2 (two) times daily.   levothyroxine (SYNTHROID) 75 MCG tablet Take 1 tablet (75 mcg total) by mouth daily before breakfast.   loratadine (CLARITIN) 10 MG tablet Take 10 mg by mouth daily.   mycophenolate (CELLCEPT) 500 MG tablet Take 1 tablet (500 mg total) by mouth 2 (two) times daily. Patient takes 500 mg in the morning and 500 mg at night.   Spacer/Aero-Holding Chambers (AEROCHAMBER HOLDING CHAMBER) DEVI as directed inhalation as directed   triamcinolone (NASACORT) 55 MCG/ACT AERO  nasal inhaler 1-2 sprays ea nostril   No facility-administered encounter medications on file as of 04/11/2023.    Allergies (verified) Azathioprine, Statins, Sulfa antibiotics, Methocarbamol, Naproxen, Lipitor [atorvastatin], Premarin [conjugated estrogens], Ampicillin, Avelox [moxifloxacin hcl in nacl], Azithromycin, Bactrim [sulfamethoxazole-trimethoprim], Crestor [rosuvastatin calcium], Doxycycline, Levaquin [levofloxacin in d5w], Livalo [pitavastatin], Macrodantin [nitrofurantoin macrocrystal], and Trental  [pentoxifylline er]   History: Past Medical History:  Diagnosis Date   Allergy    Arthritis    back painherniated disc lumbar   Asthma    Autoimmune hepatitis (HCC)    Back pain    Cancer (HCC) 07/2017   right breast cancer, lumpectomy and 20 sessions of XRT   Complication of anesthesia    GERD (gastroesophageal reflux disease)    Hiatal hernia    Hyperlipidemia    Hypothyroidism    Osteopenia    PONV (postoperative nausea and vomiting)    asks for scop patch   Thyroid disease    Vasculopathy    lymphatic   Past Surgical History:  Procedure Laterality Date   BASAL CELL CARCINOMA EXCISION     BREAST LUMPECTOMY WITH RADIOACTIVE SEED AND SENTINEL LYMPH NODE BIOPSY Right 08/22/2017   Procedure: BREAST LUMPECTOMY WITH RADIOACTIVE SEED AND SENTINEL LYMPH NODE BIOPSY;  Surgeon: Harriette Bouillon, MD;  Location: [facility];  Service: General;  Laterality: Right;   CATARACT EXTRACTION     COLONOSCOPY  2015   Dr. Kinnie Scales: Hemorrhoids, diverticulosis.  Next colonoscopy in 5 years due to previous history of adenomatous colon polyps   COLONOSCOPY N/A 11/18/2019   Procedure: COLONOSCOPY;  Surgeon: Corbin Ade, MD;  [facility];  Service: Endoscopy;  Laterality: N/A;  9:30   CYSTOSCOPY     cystscopy     ESOPHAGOGASTRODUODENOSCOPY N/A 01/28/2015   Dr.Rourk- 3 small prepyloric/gastric ulcers- likely culprits causing bleeding. hiatal hernia, small gastic polyps not manipulated.   ESOPHAGOGASTRODUODENOSCOPY N/A 05/26/2015   Dr. Jena Gauss: Small hiatal hernia, previous gastric ulcers completely healed.   KNEE SURGERY     LIVER BIOPSY  10/09/2018   TONSILLECTOMY     TUBAL LIGATION     Family History  Problem Relation Age of Onset   Stroke Mother    Hypertension Mother    Alzheimer's disease Mother    Heart disease Sister    Hypertension Sister    Heart attack Sister        pace maker    Arthritis Daughter        very bad MVA    Hypertension Son    Seizures  Son    Colon cancer Neg Hx    Liver cancer Neg Hx    Pancreatic disease Neg Hx    Social History   Socioeconomic History   Marital status: Married    Spouse name: Cheryl Perry   Number of children: 2   Years of education: 16   Highest education level: Bachelor's degree (e.g., BA, AB, BS)  Occupational History   Occupation: retired     Comment: HR work  / Therapist, music   Tobacco Use   Smoking status: Former    Current packs/day: 0.00    Types: Cigarettes    Quit date: 06/06/1974    Years since quitting: 48.8    Passive exposure: Never   Smokeless tobacco: Never   Tobacco comments:    Quit x 40 years ago  Vaping Use   Vaping status: Never Used  Substance and Sexual Activity   Alcohol use: No  Alcohol/week: 0.0 standard drinks of alcohol   Drug use: No   Sexual activity: Not on file  Other Topics Concern   Not on file  Social History Narrative   Son in [place], granddaughter in [place]   Very socially active   Social Drivers of Health   Financial Resource Strain: Low Risk  (04/11/2023)   Overall Financial Resource Strain (CARDIA)    Difficulty of Paying Living Expenses: Not hard at all  Food Insecurity: No Food Insecurity (04/11/2023)   Hunger Vital Sign    Worried About Running Out of Food in the Last Year: Never true    Ran Out of Food in the Last Year: Never true  Transportation Needs: No Transportation Needs (04/11/2023)   PRAPARE - Administrator, Civil Service (Medical): No    Lack of Transportation (Non-Medical): No  Physical Activity: Insufficiently Active (04/11/2023)   Exercise Vital Sign    Days of Exercise per Week: 1 day    Minutes of Exercise per Session: 30 min  Stress: No Stress Concern Present (04/11/2023)   [employer] of Occupational Health - Occupational Stress Questionnaire    Feeling of Stress : Not at all  Social Connections: Socially Integrated (04/11/2023)   Social Connection and Isolation Panel [NHANES]    Frequency  of Communication with Friends and Family: Three times a week    Frequency of Social Gatherings with Friends and Family: Three times a week    Attends Religious Services: More than 4 times per year    Active Member of Clubs or Organizations: Yes    Attends Engineer, structural: More than 4 times per year    Marital Status: Married    Tobacco Counseling Counseling given: Yes Tobacco comments: Quit x 40 years ago    Clinical Intake:  Pre-visit preparation completed: Yes  Pain : No/denies pain     BMI - recorded: 27.7 Nutritional Status: BMI 25 -29 Overweight Nutritional Risks: None Diabetes: No  Lab Results  Component Value Date   HGBA1C 5.5 06/19/2017     How often do you need to have someone help you when you read instructions, pamphlets, or other written materials from your doctor or pharmacy?: 1 - Never  Interpreter Needed?: No  Information entered by :: Alia T/cma   Activities of Daily Living     04/11/2023   10:26 AM  In your present state of health, do you have any difficulty performing the following activities:  Hearing? 0  Vision? 0  Difficulty concentrating or making decisions? 0  Walking or climbing stairs? 1  Comment have trouble coming down stairs  Dressing or bathing? 0  Doing errands, shopping? 0  Preparing Food and eating ? N  Using the Toilet? N  In the past six months, have you accidently leaked urine? Y  Comment a little bit/wears pad  Do you have problems with loss of bowel control? N  Managing your Medications? N  Managing your Finances? N  Housekeeping or managing your Housekeeping? N    Patient Care Team: Junie Spencer, FNP as PCP - General (Family Medicine) Ernesto Rutherford, MD as Consulting Physician (Ophthalmology) Jena Gauss, Gerrit Friends, MD as Consulting Physician (Gastroenterology) Sheran Luz, MD as Consulting Physician (Physical Medicine and Rehabilitation) Bradly Bienenstock, MD as Consulting Physician (Orthopedic  Surgery) Sharrell Ku, MD as Consulting Physician (Gastroenterology) Serena Croissant, MD as Consulting Physician (Hematology and Oncology) Harriette Bouillon, MD as Consulting Physician (General Surgery) Dorothy Puffer, MD as Consulting Physician (Radiation  Oncology) Axel Filler Larna Daughters, NP as Nurse Practitioner (Hematology and Oncology)  Indicate any recent Medical Services you may have received from other than [facility] providers in the past year (date may be approximate).     Assessment:   This is a routine wellness examination for Chanell.  Hearing/Vision screen Hearing Screening - Comments:: Pt denies hearing -use hearing aids Vision Screening - Comments:: Pt denies vision def Pt goes [facility] in [place]   Goals Addressed             This Visit's Progress    Increase physical activity   On track    Stay active, travel - spend time with Grandchildren      Patient Stated   On track    04/06/2021 AWV Goal: Exercise for General Health  Patient will verbalize understanding of the benefits of increased physical activity: Exercising regularly is important. It will improve your overall fitness, flexibility, and endurance. Regular exercise also will improve your overall health. It can help you control your weight, reduce stress, and improve your bone density. Over the next year, patient will increase physical activity as tolerated with a goal of at least 150 minutes of moderate physical activity per week.  You can tell that you are exercising at a moderate intensity if your heart starts beating faster and you start breathing faster but can still hold a conversation. Moderate-intensity exercise ideas include: Walking 1 mile (1.6 km) in about 15 minutes Biking Hiking Golfing Dancing Water aerobics Patient will verbalize understanding of everyday activities that increase physical activity by providing examples like the following: Yard work, such as: Location manager Gardening Washing windows or floors Patient will be able to explain general safety guidelines for exercising:  Before you start a new exercise program, talk with your health care provider. Do not exercise so much that you hurt yourself, feel dizzy, or get very short of breath. Wear comfortable clothes and wear shoes with good support. Drink plenty of water while you exercise to prevent dehydration or heat stroke. Work out until your breathing and your heartbeat get faster.        Depression Screen     04/11/2023   10:21 AM 03/08/2023    8:10 AM 09/06/2022    8:10 AM 04/10/2022   10:42 AM 03/08/2022    8:42 AM 04/06/2021   12:07 PM 03/17/2021   12:29 PM  PHQ 2/9 Scores  PHQ - 2 Score 0 0 1 0 1 1 1   PHQ- 9 Score   2 0 3 3 5     Fall Risk     04/11/2023   10:21 AM 03/08/2023    8:10 AM 04/10/2022   10:37 AM 03/08/2022    8:06 AM 04/06/2021   12:01 PM  Fall Risk   Falls in the past year? 0 0 0 0 0  Number falls in past yr: 0 0 0  0  Injury with Fall? 0 0 0  0  Risk for fall due to : No Fall Risks No Fall Risks No Fall Risks  Orthopedic patient  Follow up Falls prevention discussed;Falls evaluation completed Falls evaluation completed;Education provided Falls prevention discussed  Falls prevention discussed    MEDICARE RISK AT HOME:  Medicare Risk at Home Any stairs in or around the home?: No If so, are there any without handrails?: No Home free of loose throw rugs in walkways, pet beds,  electrical cords, etc?: Yes Adequate lighting in your home to reduce risk of falls?: Yes Life alert?: No Use of a cane, walker or w/c?: No Grab bars in the bathroom?: Yes Shower chair or bench in shower?: Yes Elevated toilet seat or a handicapped toilet?: Yes  TIMED UP AND GO:  Was the test performed?  No  Cognitive Function: 6CIT completed    01/16/2017   10:25 AM 10/18/2014   11:08 AM  MMSE - Mini Mental  State Exam  Orientation to time 5 5  Orientation to Place 5 5  Registration 3 3  Attention/ Calculation 5 5  Recall 3 3  Language- name 2 objects 2 2  Language- repeat 1 1  Language- follow 3 step command 3 3  Language- read & follow direction 1 1  Write a sentence 1 1  Copy design 1 1  Total score 30 30        04/11/2023   10:36 AM 04/10/2022   10:44 AM 04/06/2021   12:16 PM 04/05/2020    3:46 PM 06/12/2018    2:40 PM  6CIT Screen  What Year? 0 points 0 points 0 points 0 points 0 points  What month? 0 points 0 points 0 points 0 points 0 points  What time? 0 points 0 points 0 points 0 points 0 points  Count back from 20 0 points 0 points 0 points 0 points 0 points  Months in reverse 0 points 0 points 0 points 0 points 0 points  Repeat phrase 0 points 0 points 0 points 0 points 0 points  Total Score 0 points 0 points 0 points 0 points 0 points    Immunizations Immunization History  Administered Date(s) Administered   DTaP 10/28/2014   Fluad Quad(high Dose 65+) 10/13/2021   Hepatitis A 04/03/2016   Influenza, High Dose Seasonal PF 10/28/2014, 10/11/2015, 10/10/2016, 10/14/2017, 03/20/2018, 10/17/2018, 03/23/2019, 10/27/2019, 09/05/2020   Influenza,inj,Quad PF,6+ Mos 10/18/2014   Influenza,inj,quad, With Preservative 10/09/2018   Influenza-Unspecified 12/09/2012, 11/09/2013   Moderna Covid-19 Fall Seasonal Vaccine 26yrs & older 12/01/2021   Moderna Covid-19 Vaccine Bivalent Booster 54yrs & up 10/10/2020   Moderna Sars-Covid-2 Vaccination 02/20/2019, 03/21/2019, 03/23/2019, 09/25/2019   PNEUMOCOCCAL CONJUGATE-20 09/29/2020   Pneumococcal Conjugate-13 12/09/2013   Pneumococcal Polysaccharide-23 05/04/2011, 10/22/2013, 10/28/2014, 03/20/2018, 10/27/2019   Pneumococcal-Unspecified 09/28/2020   Respiratory Syncytial Virus Vaccine,Recomb Aduvanted(Arexvy) 01/02/2022   Tdap 06/09/2003, 06/09/2013   Typhoid Live 04/03/2016   Zoster Recombinant(Shingrix) 11/21/2020, 03/07/2021    Zoster, Live 04/21/2010    Screening Tests Health Maintenance  Topic Date Due   Medicare Annual Wellness (AWV)  04/10/2023   COVID-19 Vaccine (7 - 2024-25 season) 04/26/2024 (Originally 09/09/2022)   MAMMOGRAM  07/30/2023   INFLUENZA VACCINE  08/09/2023   DEXA SCAN  09/05/2024   DTaP/Tdap/Td (4 - Td or Tdap) 10/27/2024   Colonoscopy  11/17/2024   Pneumonia Vaccine 5+ Years old  Completed   Hepatitis C Screening  Completed   Zoster Vaccines- Shingrix  Completed   HPV VACCINES  Aged Out    Health Maintenance  Health Maintenance Due  Topic Date Due   Medicare Annual Wellness (AWV)  04/10/2023   Health Maintenance Items Addressed: See Nurse Notes  Additional Screening:  Vision Screening: Recommended annual ophthalmology exams for early detection of glaucoma and other disorders of the eye.  Dental Screening: Recommended annual dental exams for proper oral hygiene  Community Resource Referral / Chronic Care Management: CRR required this visit?  No   CCM required this  visit?  No     Plan:     I have personally reviewed and noted the following in the patient's chart:   Medical and social history Use of alcohol, tobacco or illicit drugs  Current medications and supplements including opioid prescriptions. Patient is not currently taking opioid prescriptions. Functional ability and status Nutritional status Physical activity Advanced directives List of other physicians Hospitalizations, surgeries, and ER visits in previous 12 months Vitals Screenings to include cognitive, depression, and falls Referrals and appointments  In addition, I have reviewed and discussed with patient certain preventive protocols, quality metrics, and best practice recommendations. A written personalized care plan for preventive services as well as general preventive health recommendations were provided to patient.     Arta Silence, CMA   04/11/2023   After Visit Summary: (MyChart) Due to  this being a telephonic visit, the after visit summary with patients personalized plan was offered to patient via MyChart   Notes: Nothing significant to report at this time.

## 2023-04-25 DIAGNOSIS — Z85828 Personal history of other malignant neoplasm of skin: Secondary | ICD-10-CM | POA: Diagnosis not present

## 2023-04-25 DIAGNOSIS — D692 Other nonthrombocytopenic purpura: Secondary | ICD-10-CM | POA: Diagnosis not present

## 2023-04-25 DIAGNOSIS — L821 Other seborrheic keratosis: Secondary | ICD-10-CM | POA: Diagnosis not present

## 2023-04-25 DIAGNOSIS — L813 Cafe au lait spots: Secondary | ICD-10-CM | POA: Diagnosis not present

## 2023-04-25 DIAGNOSIS — L905 Scar conditions and fibrosis of skin: Secondary | ICD-10-CM | POA: Diagnosis not present

## 2023-04-25 DIAGNOSIS — L720 Epidermal cyst: Secondary | ICD-10-CM | POA: Diagnosis not present

## 2023-05-07 ENCOUNTER — Other Ambulatory Visit: Payer: Self-pay | Admitting: Family

## 2023-05-07 DIAGNOSIS — K754 Autoimmune hepatitis: Secondary | ICD-10-CM

## 2023-05-07 MED ORDER — MYCOPHENOLATE MOFETIL 500 MG PO TABS: 500.0000 mg | ORAL_TABLET | Freq: Two times a day (BID) | ORAL | 1 refills | Status: DC

## 2023-05-13 DIAGNOSIS — J4540 Moderate persistent asthma, uncomplicated: Secondary | ICD-10-CM | POA: Diagnosis not present

## 2023-05-13 DIAGNOSIS — J301 Allergic rhinitis due to pollen: Secondary | ICD-10-CM | POA: Diagnosis not present

## 2023-05-13 DIAGNOSIS — J3089 Other allergic rhinitis: Secondary | ICD-10-CM | POA: Diagnosis not present

## 2023-05-13 DIAGNOSIS — J3081 Allergic rhinitis due to animal (cat) (dog) hair and dander: Secondary | ICD-10-CM | POA: Diagnosis not present

## 2023-05-16 ENCOUNTER — Ambulatory Visit (INDEPENDENT_AMBULATORY_CARE_PROVIDER_SITE_OTHER)

## 2023-05-16 ENCOUNTER — Ambulatory Visit: Admitting: Family Medicine

## 2023-05-16 ENCOUNTER — Encounter: Payer: Self-pay | Admitting: Family Medicine

## 2023-05-16 ENCOUNTER — Ambulatory Visit: Payer: Self-pay

## 2023-05-16 VITALS — BP 133/84 | HR 80 | Temp 98.7°F | Ht 65.0 in | Wt 163.0 lb

## 2023-05-16 DIAGNOSIS — I10 Essential (primary) hypertension: Secondary | ICD-10-CM | POA: Diagnosis not present

## 2023-05-16 DIAGNOSIS — M25512 Pain in left shoulder: Secondary | ICD-10-CM

## 2023-05-16 DIAGNOSIS — M25612 Stiffness of left shoulder, not elsewhere classified: Secondary | ICD-10-CM

## 2023-05-16 NOTE — Telephone Encounter (Signed)
  Chief Complaint: Shoulder pain  Symptoms: left shoulder pain along with left elbow pain Frequency: yesterday Pertinent Negatives: Patient denies swelling, CP, SOB Disposition: [] ED /[] Urgent Care (no appt availability in office) / [x] Appointment(In office/virtual)/ []  [place] [facility]/ [] Home Care/ [] Refused Recommended Disposition /[] [place] [facility]/ []  Follow-up with PCP Additional Notes: patient calling in with left shoulder pain that initially started yesterday but has gotten progressively worse today. Patient endorses left elbow pain along with shoulder pain. Patient states she has tried OTC meds and ice with no help with pain. Patient is recommended to be seen within 24 hours but patient requested a same day appointment. Patient scheduled to be seen today 05/16/2023 at 3:20 PM to see DOD for the practice. Patient verbalized understanding and information for appointment. All questions answered.    Copied from CRM [id number]. Topic: Clinical - Red Word Triage >> May 16, 2023 11:07 AM Arlie Benedict B wrote: [facility] that prompted transfer to Nurse Triage: left shoulder pain radiating to the patient's elbow when she woke up Reason for Disposition  [1] Unable to use arm at all AND [2] because of shoulder pain or stiffness  Answer Assessment - Initial Assessment Questions 1. ONSET: "When did the pain start?"     Started yesterday 2. LOCATION: "Where is the pain located?"     Left shoulder into elbow 3. PAIN: "How bad is the pain?" (Scale 1-10; or mild, moderate, severe)   - MILD (1-3): doesn't interfere with normal activities   - MODERATE (4-7): interferes with normal activities (e.g., work or school) or awakens from sleep   - SEVERE (8-10): excruciating pain, unable to do any normal activities, unable to move arm at all due to pain     9 out of 10 4. WORK OR EXERCISE: "Has there been any recent work or exercise that involved this part of the body?"     Nothing new 5. CAUSE: "What do  you think is causing the shoulder pain?"     unsure 6. OTHER SYMPTOMS: "Do you have any other symptoms?" (e.g., neck pain, swelling, rash, fever, numbness, weakness)     no  Protocols used: Shoulder Pain-A-AH

## 2023-05-16 NOTE — Patient Instructions (Signed)
Shoulder Pain Many things can cause shoulder pain, including: An injury to the shoulder. Overuse of the shoulder. Arthritis. The source of the pain can be: Inflammation. An injury to the shoulder joint. An injury to a tendon, ligament, or bone. Follow these instructions at home: Pay attention to changes in your symptoms. Let your health care provider know about them. Follow these instructions to relieve your pain. If you have a removable sling: Wear the sling as told by your provider. Remove it only as told by your provider. Check the skin around the sling every day. Tell your provider about any concerns. Loosen the sling if your fingers tingle, become numb, or become cold. Keep the sling clean. If the sling is not waterproof: Do not let it get wet. Remove it to shower or bathe. Move your arm as little as possible, but keep your hand moving to prevent swelling. Managing pain, stiffness, and swelling  If told, put ice on the painful area. If you have a removable sling or immobilizer, remove it as told by your provider. Put ice in a plastic bag. Place a towel between your skin and the bag. Leave the ice on for 20 minutes, 2-3 times a day. If your skin turns bright red, remove the ice right away to prevent skin damage. The risk of damage is higher if you cannot feel pain, heat, or cold. Move your fingers often to reduce stiffness and swelling. Squeeze a soft ball or a foam pad as much as possible. This helps to keep the shoulder from swelling. It also helps to strengthen the arm. General instructions Take over-the-counter and prescription medicines only as told by your provider. Exercise may help with pain management. Perform exercises if told by your provider. You may be referred to a physical therapist to help in your recovery process. Keep all follow-up visits in order to avoid any type of permanent shoulder disability or chronic pain problems. Contact a health care provider  if: Your pain is not relieved with medicines. New pain develops in your arm, hand, or fingers. You loosen your sling and your arm, hand, or fingers remain tingly, numb, swollen, or painful. Get help right away if: Your arm, hand, or fingers turn white or blue. This information is not intended to replace advice given to you by your health care provider. Make sure you discuss any questions you have with your health care provider. Document Revised: 07/28/2021 Document Reviewed: 07/28/2021 Elsevier Patient Education  2024 Elsevier Inc.  

## 2023-05-16 NOTE — Progress Notes (Signed)
   Acute Office Visit  Subjective:     Patient ID: Cheryl Perry, female    DOB: October 15, 1945, 78 y.o.   MRN: [id number]  Chief Complaint  Patient presents with   Shoulder Pain    Shoulder Pain  The pain is present in the left shoulder. This is a new problem. The current episode started in the past 7 days. There has been no history of extremity trauma. The problem occurs constantly. The problem has been gradually worsening. The quality of the pain is described as aching. Pain scale: 5 at rest, sharp pain at a 9 with movement. Associated symptoms include a limited range of motion. Pertinent negatives include no joint locking, joint swelling, numbness or tingling. The symptoms are aggravated by activity and lying down. She has tried heat, movement, OTC ointments, rest and acetaminophen  (flexeril) for the symptoms. The treatment provided mild relief. Her past medical history is significant for osteoarthritis.   Denies injury. She has been cleaning out her son's home over the last month so has been doing increased lifting.   Review of Systems  Neurological:  Negative for tingling and numbness.        Objective:    BP 133/84 Comment: at home reading per pt  Pulse 80   Temp 98.7 F (37.1 C) (Temporal)   Ht 5\' 5"  (1.651 m)   Wt 163 lb (73.9 kg)   SpO2 97%   BMI 27.12 kg/m    Physical Exam Vitals and nursing note reviewed.  Constitutional:      General: She is not in acute distress.    Appearance: She is not ill-appearing, toxic-appearing or diaphoretic.  Pulmonary:     Effort: Pulmonary effort is normal. No respiratory distress.  Musculoskeletal:     Left shoulder: Tenderness present. No swelling, effusion, bony tenderness or crepitus. Decreased range of motion.     Right lower leg: No edema.     Left lower leg: No edema.     Comments: Limited ROM of left shoulder. Minimal extension, flexion to 60%, abduction to 40%.   Skin:    General: Skin is warm and dry.  Neurological:      General: No focal deficit present.     Mental Status: She is alert and oriented to person, place, and time.  Psychiatric:        Mood and Affect: Mood normal.        Behavior: Behavior normal.     No results found for any visits on 05/16/23.      Assessment & Plan:   Cheryl Perry was seen today for shoulder pain.  Diagnoses and all orders for this visit:  Acute pain of left shoulder -     DG Shoulder Left; Future -     Cancel: Ambulatory referral to Orthopedic Surgery -     Ambulatory referral to Orthopedic Surgery  Impaired range of motion of left shoulder -     Cancel: Ambulatory referral to Orthopedic Surgery -     Ambulatory referral to Orthopedic Surgery  Primary hypertension Bp elevated today in office but well controlled at home.   Xray negative for acute changes, agree with radiology read. Concern for rotator cuff pathology. Limited ROM and significant pain with movement. Urgent referral to ortho discussed and placed. Discussed RICE therapy in the meantime. Tylenol  for pain.    Albertha Huger, FNP

## 2023-06-04 DIAGNOSIS — K754 Autoimmune hepatitis: Secondary | ICD-10-CM | POA: Diagnosis not present

## 2023-06-04 DIAGNOSIS — D849 Immunodeficiency, unspecified: Secondary | ICD-10-CM | POA: Diagnosis not present

## 2023-06-07 DIAGNOSIS — M25512 Pain in left shoulder: Secondary | ICD-10-CM | POA: Diagnosis not present

## 2023-07-30 NOTE — Progress Notes (Unsigned)
 Office Visit Note  Patient: Cheryl Perry             Date of Birth: [birth year]           MRN: [id number]             PCP: Lavell Bari LABOR, FNP Referring: Lavell Bari LABOR, FNP Visit Date: 08/13/2023 Occupation: @GUAROCC @  Subjective:  Right knee joint pain   History of Present Illness: STACEE EARP is a 78 y.o. female with history of osteoarthritis and DDD.  Patient presents today with recurrence of right knee joint pain.  She had a right knee joint cortisone injection performed on 02/12/2023 which abided significant relief but her symptoms have started to recur.  She requested a repeat right knee joint cortisone injection today. Patient remains under the care of gastroenterology for management of autoimmune hepatitis.  She remains on CellCept  as prescribed.  She has not had any recent flares. Patient has been grieving the loss of her son and step grandson.  She has been meeting with a grief counseling group through her church.  She is also been acting as the primary caregiver for her husband.    Activities of Daily Living:  Patient reports morning stiffness for 0 minute.   Patient Denies nocturnal pain.  Difficulty dressing/grooming: Denies Difficulty climbing stairs: Denies Difficulty getting out of chair: Reports Difficulty using hands for taps, buttons, cutlery, and/or writing: Denies  Review of Systems  Constitutional:  Positive for fatigue.  HENT:  Negative for mouth sores and mouth dryness.   Eyes:  Negative for dryness.  Respiratory:  Negative for shortness of breath.   Cardiovascular:  Negative for chest pain and palpitations.  Gastrointestinal:  Positive for diarrhea. Negative for blood in stool and constipation.  Endocrine: Negative for increased urination.  Genitourinary:  Negative for involuntary urination.  Musculoskeletal:  Positive for joint pain, joint pain, joint swelling, myalgias, muscle tenderness and myalgias. Negative for gait problem, muscle  weakness and morning stiffness.  Skin:  Positive for sensitivity to sunlight. Negative for color change, rash and hair loss.  Allergic/Immunologic: Negative for susceptible to infections.  Neurological:  Positive for dizziness. Negative for headaches.  Hematological:  Negative for swollen glands.  Psychiatric/Behavioral:  Negative for depressed mood and sleep disturbance. The patient is not nervous/anxious.     PMFS History:  Patient Active Problem List   Diagnosis Date Noted   History of breast cancer 03/07/2021   Autoimmune hepatitis (HCC) 01/12/2019   Elevated liver function tests 12/25/2017   H/O adenomatous polyp of colon 12/12/2017   Back pain 12/14/2016   Overweight (BMI 25.0-29.9) 06/14/2015   Gastric ulceration    Hiatal hernia    History of lower GI bleeding    UGI bleed 01/27/2015   GERD (gastroesophageal reflux disease) 06/15/2014   Osteopenia 09/02/2013   Hypothyroidism 06/05/2012   HLD (hyperlipidemia) 06/05/2012   Asthma 06/05/2012    Past Medical History:  Diagnosis Date   Allergy    Arthritis    back painherniated disc lumbar   Asthma    Autoimmune hepatitis (HCC)    Back pain    Cancer (HCC) 07/2017   right breast cancer, lumpectomy and 20 sessions of XRT   Complication of anesthesia    GERD (gastroesophageal reflux disease)    Hiatal hernia    Hyperlipidemia    Hypothyroidism    Osteopenia    PONV (postoperative nausea and vomiting)    asks for scop patch   Thyroid   disease    Vasculopathy    lymphatic    Family History  Problem Relation Age of Onset   Stroke Mother    Hypertension Mother    Alzheimer's disease Mother    Heart disease Sister    Hypertension Sister    Heart attack Sister        pace maker    Arthritis Daughter        very bad MVA    Hypertension Son    Seizures Son    Colon cancer Neg Hx    Liver cancer Neg Hx    Pancreatic disease Neg Hx    Past Surgical History:  Procedure Laterality Date   BASAL CELL CARCINOMA  EXCISION     BREAST LUMPECTOMY WITH RADIOACTIVE SEED AND SENTINEL LYMPH NODE BIOPSY Right 08/22/2017   Procedure: BREAST LUMPECTOMY WITH RADIOACTIVE SEED AND SENTINEL LYMPH NODE BIOPSY;  Surgeon: Vanderbilt Ned, MD;  Location: [facility];  Service: General;  Laterality: Right;   CATARACT EXTRACTION     COLONOSCOPY  2015   Dr. Luis: Hemorrhoids, diverticulosis.  Next colonoscopy in 5 years due to previous history of adenomatous colon polyps   COLONOSCOPY N/A 11/18/2019   Procedure: COLONOSCOPY;  Surgeon: Shaaron Lamar HERO, MD;  Location: [facility];  Service: Endoscopy;  Laterality: N/A;  9:30   CYSTOSCOPY     cystscopy     ESOPHAGOGASTRODUODENOSCOPY N/A 01/28/2015   Dr.Rourk- 3 small prepyloric/gastric ulcers- likely culprits causing bleeding. hiatal hernia, small gastic polyps not manipulated.   ESOPHAGOGASTRODUODENOSCOPY N/A 05/26/2015   Dr. Shaaron: Small hiatal hernia, previous gastric ulcers completely healed.   KNEE SURGERY     LIVER BIOPSY  10/09/2018   TONSILLECTOMY     TUBAL LIGATION     Social History   Social History Narrative   Son in [place], granddaughter in [place]   Very socially active   Immunization History  Administered Date(s) Administered   DTaP 10/28/2014   Fluad Quad(high Dose 65+) 10/13/2021   Hepatitis A 04/03/2016   Influenza, High Dose Seasonal PF 10/28/2014, 10/11/2015, 10/10/2016, 10/14/2017, 03/20/2018, 10/17/2018, 03/23/2019, 10/27/2019, 09/05/2020   Influenza,inj,Quad PF,6+ Mos 10/18/2014   Influenza,inj,quad, With Preservative 10/09/2018   Influenza-Unspecified 12/09/2012, 11/09/2013   Moderna Covid-19 Fall Seasonal Vaccine 58yrs & older 12/01/2021   Moderna Covid-19 Vaccine Bivalent Booster 40yrs & up 10/10/2020   Moderna Sars-Covid-2 Vaccination 02/20/2019, 03/21/2019, 03/23/2019, 09/25/2019   PNEUMOCOCCAL CONJUGATE-20 09/29/2020   Pneumococcal Conjugate-13 12/09/2013   Pneumococcal Polysaccharide-23 05/04/2011, 10/22/2013,  10/28/2014, 03/20/2018, 10/27/2019   Pneumococcal-Unspecified 09/28/2020   Respiratory Syncytial Virus Vaccine,Recomb Aduvanted(Arexvy) 01/02/2022   Tdap 06/09/2003, 06/09/2013   Typhoid Live 04/03/2016   Zoster Recombinant(Shingrix) 11/21/2020, 03/07/2021   Zoster, Live 04/21/2010     Objective: Vital Signs: BP 131/84 (BP Location: Left Arm, Patient Position: Sitting, Cuff Size: Normal)   Pulse (!) 59   Ht 5' 5 (1.651 m)   Wt 159 lb 12.8 oz (72.5 kg)   BMI 26.59 kg/m    Physical Exam Vitals and nursing note reviewed.  Constitutional:      Appearance: She is well-developed.  HENT:     Head: Normocephalic and atraumatic.  Eyes:     Conjunctiva/sclera: Conjunctivae normal.  Cardiovascular:     Rate and Rhythm: Normal rate and regular rhythm.     Heart sounds: Normal heart sounds.  Pulmonary:     Effort: Pulmonary effort is normal.     Breath sounds: Normal breath sounds.  Abdominal:     General:  Bowel sounds are normal.     Palpations: Abdomen is soft.  Musculoskeletal:     Cervical back: Normal range of motion.  Lymphadenopathy:     Cervical: No cervical adenopathy.  Skin:    General: Skin is warm and dry.     Capillary Refill: Capillary refill takes less than 2 seconds.  Neurological:     Mental Status: She is alert and oriented to person, place, and time.  Psychiatric:        Behavior: Behavior normal.      Musculoskeletal Exam: C-spine has limited range of motion.  Thoracic kyphosis noted.  Limited mobility of the lumbar spine.  Shoulder joints, elbow joints, wrist joints, MCPs, PIPs, DIPs have good range of motion with no synovitis.  PIP and DIP thickening consistent with osteoarthritis of both hands.  Subluxation of several DIP and PIP joints.  Hip joints have good range of motion with no groin pain.  Discomfort range of motion of the right knee.  No warmth or effusion of the right knee noted.  Left knee joint has good range of motion with no warmth or effusion.   Ankle joints have good range of motion with no tenderness or joint swelling.   CDAI Exam: CDAI Score: -- Patient Global: --; Provider Global: -- Swollen: --; Tender: -- Joint Exam 08/13/2023   No joint exam has been documented for this visit   There is currently no information documented on the homunculus. Go to the Rheumatology activity and complete the homunculus joint exam.  Investigation: No additional findings.  Imaging: No results found.  Recent Labs: Lab Results  Component Value Date   WBC 6.0 03/08/2023   HGB 14.6 03/08/2023   PLT 208 03/08/2023   NA 143 03/08/2023   K 4.2 03/08/2023   CL 105 03/08/2023   CO2 22 03/08/2023   GLUCOSE 83 03/08/2023   BUN 11 03/08/2023   CREATININE 0.81 03/08/2023   BILITOT 0.4 03/08/2023   ALKPHOS 68 03/08/2023   AST 19 03/08/2023   ALT 13 03/08/2023   PROT 6.0 03/08/2023   ALBUMIN 4.1 03/08/2023   CALCIUM  9.5 03/08/2023   GFRAA 84 09/03/2019    Speciality Comments: Imuran-discontinued due to cholestatic liver injury and upper GI side effects.  Procedures:  Large Joint Inj: R knee on 08/13/2023 2:48 PM Indications: pain Details: 27 G 1.5 in needle, medial approach  Arthrogram: No  Medications: 1.5 mL lidocaine  1 %; 40 mg triamcinolone  acetonide 40 MG/ML Aspirate: 0 mL Outcome: tolerated well, no immediate complications Procedure, treatment alternatives, risks and benefits explained, specific risks discussed. Consent was given by the patient. Immediately prior to procedure a time out was called to verify the correct patient, procedure, equipment, support staff and site/side marked as required. Patient was prepped and draped in the usual sterile fashion.     Allergies: Azathioprine, Statins, Sulfa antibiotics, Methocarbamol , Naproxen , Lipitor [atorvastatin], Premarin [conjugated estrogens], Ampicillin, Avelox [moxifloxacin hcl in nacl], Azithromycin , Bactrim [sulfamethoxazole-trimethoprim], Crestor  [rosuvastatin  calcium ],  Doxycycline , Levaquin [levofloxacin in d5w], Livalo  [pitavastatin ], Macrodantin [nitrofurantoin macrocrystal], and Trental [pentoxifylline er]   Assessment / Plan:     Visit Diagnoses: Primary osteoarthritis of both hands: She has PIP and DIP thickening consistent with osteoarthritis of both hands.  No synovitis noted.  Complete fist formation bilaterally.  Subluxation of several PIP and DIP joints noted.  Discussed the importance of joint protection and muscle strengthening.  Chronic pain of right knee - Right knee injection 02/12/23 - Plan: Large Joint Inj: R knee  Chondrocalcinosis of right knee - X-rays mild osteoarthritis, moderate chondromalacia patella and chondrocalcinosis.  Patient presents today with a recurrence of pain in the right knee.  No recent injury or fall.  No mechanical symptoms.  She experiences some discomfort and instability when going down steps but has no difficulty going up steps.  Patient had a right knee joint cortisone injection performed on 02/12/2023 which provided significant relief but her symptoms have recurred.  Patient requested a repeat cortisone injection today.  She tolerated the procedure well.  Procedure note was completed above.  Aftercare was discussed.  Primary osteoarthritis of both feet: She has good range of motion of both ankle joints with no tenderness or joint swelling.  DDD (degenerative disc disease), cervical: C-spine has limited range of motion with lateral rotation.  DDD (degenerative disc disease), thoracic: Thoracic kyphosis noted.  Degeneration of intervertebral disc of lumbar region without discogenic back pain or lower extremity pain: Limited mobility of the lumbar spine.  No symptoms of radiculopathy.  Osteopenia of multiple sites - Previous DEXA 03/07/20 T score -2.3 RFN. DEXA 09/06/2022: Lumbar BMD 1.026 with T-score -1.3.  LFN T-score -1.5, right femoral neck BMD 0.709 with T-score -2.4. She is taking calcium  and vitamin D  supplement  daily. DEXA due in August 2026.  Autoimmune hepatitis (HCC) - Bx positive on 10/02/18.  She is followed by Dr. Berta at [facility].  She remains on Cellcept  500 mg 1 tablet by mouth twice daily.  Hepatic function panel within normal limits on 06/04/2023.  High risk medication use - CellCept  500 mg 1 tablet by mouth twice daily prescribed by GI.  Hepatic function panel within normal limits on 06/04/2023.  Positive ANA (antinuclear antibody) - AVISE index -0.4, ANA titer negative, RF 18, TPO antibody positive.  She has no other clinical features of autoimmune disease.  Vitamin D  deficiency: She is taking vitamin D  2000 units daily.  Other medical conditions are listed as follows:  Malignant neoplasm of upper-outer quadrant of right breast in female, estrogen receptor positive (HCC)  History of hyperlipidemia  H/O adenomatous polyp of colon  History of asthma  History of gastroesophageal reflux (GERD)  History of lower GI bleeding  Hiatal hernia  History of hypothyroidism  Orders: Orders Placed This Encounter  Procedures   Large Joint Inj: R knee   No orders of the defined types were placed in this encounter.    Follow-Up Instructions: Return in about 6 months (around 02/13/2024) for Osteoarthritis.   Waddell CHRISTELLA Craze, PA-C  Note - This record has been created using Dragon software.  Chart creation errors have been sought, but may not always  have been located. Such creation errors do not reflect on  the standard of medical care.

## 2023-08-05 DIAGNOSIS — Z1231 Encounter for screening mammogram for malignant neoplasm of breast: Secondary | ICD-10-CM | POA: Diagnosis not present

## 2023-08-05 LAB — HM MAMMOGRAPHY

## 2023-08-13 ENCOUNTER — Ambulatory Visit: Payer: Medicare Other | Admitting: Rheumatology

## 2023-08-13 ENCOUNTER — Ambulatory Visit: Attending: Physician Assistant | Admitting: Physician Assistant

## 2023-08-13 ENCOUNTER — Encounter: Payer: Self-pay | Admitting: Physician Assistant

## 2023-08-13 VITALS — BP 131/84 | HR 59 | Ht 65.0 in | Wt 159.8 lb

## 2023-08-13 DIAGNOSIS — Z8709 Personal history of other diseases of the respiratory system: Secondary | ICD-10-CM | POA: Insufficient documentation

## 2023-08-13 DIAGNOSIS — R768 Other specified abnormal immunological findings in serum: Secondary | ICD-10-CM | POA: Diagnosis not present

## 2023-08-13 DIAGNOSIS — M8589 Other specified disorders of bone density and structure, multiple sites: Secondary | ICD-10-CM | POA: Insufficient documentation

## 2023-08-13 DIAGNOSIS — M11261 Other chondrocalcinosis, right knee: Secondary | ICD-10-CM | POA: Diagnosis not present

## 2023-08-13 DIAGNOSIS — Z79899 Other long term (current) drug therapy: Secondary | ICD-10-CM | POA: Insufficient documentation

## 2023-08-13 DIAGNOSIS — G8929 Other chronic pain: Secondary | ICD-10-CM | POA: Insufficient documentation

## 2023-08-13 DIAGNOSIS — Z8719 Personal history of other diseases of the digestive system: Secondary | ICD-10-CM | POA: Diagnosis not present

## 2023-08-13 DIAGNOSIS — M19071 Primary osteoarthritis, right ankle and foot: Secondary | ICD-10-CM | POA: Diagnosis not present

## 2023-08-13 DIAGNOSIS — Z860101 Personal history of adenomatous and serrated colon polyps: Secondary | ICD-10-CM | POA: Insufficient documentation

## 2023-08-13 DIAGNOSIS — M19042 Primary osteoarthritis, left hand: Secondary | ICD-10-CM | POA: Diagnosis not present

## 2023-08-13 DIAGNOSIS — E559 Vitamin D deficiency, unspecified: Secondary | ICD-10-CM | POA: Diagnosis not present

## 2023-08-13 DIAGNOSIS — Z170 Estrogen receptor positive status [ER+]: Secondary | ICD-10-CM | POA: Diagnosis not present

## 2023-08-13 DIAGNOSIS — K754 Autoimmune hepatitis: Secondary | ICD-10-CM | POA: Insufficient documentation

## 2023-08-13 DIAGNOSIS — Z8639 Personal history of other endocrine, nutritional and metabolic disease: Secondary | ICD-10-CM | POA: Diagnosis not present

## 2023-08-13 DIAGNOSIS — M5134 Other intervertebral disc degeneration, thoracic region: Secondary | ICD-10-CM | POA: Diagnosis not present

## 2023-08-13 DIAGNOSIS — M25561 Pain in right knee: Secondary | ICD-10-CM | POA: Diagnosis not present

## 2023-08-13 DIAGNOSIS — M5030 Other cervical disc degeneration, unspecified cervical region: Secondary | ICD-10-CM | POA: Insufficient documentation

## 2023-08-13 DIAGNOSIS — M19072 Primary osteoarthritis, left ankle and foot: Secondary | ICD-10-CM | POA: Diagnosis not present

## 2023-08-13 DIAGNOSIS — M19041 Primary osteoarthritis, right hand: Secondary | ICD-10-CM | POA: Diagnosis not present

## 2023-08-13 DIAGNOSIS — K449 Diaphragmatic hernia without obstruction or gangrene: Secondary | ICD-10-CM | POA: Insufficient documentation

## 2023-08-13 DIAGNOSIS — C50411 Malignant neoplasm of upper-outer quadrant of right female breast: Secondary | ICD-10-CM | POA: Insufficient documentation

## 2023-08-13 DIAGNOSIS — M51369 Other intervertebral disc degeneration, lumbar region without mention of lumbar back pain or lower extremity pain: Secondary | ICD-10-CM | POA: Diagnosis not present

## 2023-08-13 MED ORDER — LIDOCAINE HCL 1 % IJ SOLN
1.5000 mL | INTRAMUSCULAR | Status: AC | PRN
Start: 2023-08-13 — End: 2023-08-13
  Administered 2023-08-13: 1.5 mL

## 2023-08-13 MED ORDER — TRIAMCINOLONE ACETONIDE 40 MG/ML IJ SUSP
40.0000 mg | INTRAMUSCULAR | Status: AC | PRN
  Administered 2023-08-13: 40 mg via INTRA_ARTICULAR

## 2023-09-05 ENCOUNTER — Ambulatory Visit: Payer: Medicare Other | Admitting: Family

## 2023-10-04 ENCOUNTER — Ambulatory Visit: Admitting: Family

## 2023-10-04 ENCOUNTER — Encounter: Payer: Self-pay | Admitting: Family

## 2023-10-04 VITALS — BP 110/76 | HR 64 | Temp 97.0°F | Ht 65.0 in | Wt 156.4 lb

## 2023-10-04 DIAGNOSIS — M8589 Other specified disorders of bone density and structure, multiple sites: Secondary | ICD-10-CM | POA: Diagnosis not present

## 2023-10-04 DIAGNOSIS — K219 Gastro-esophageal reflux disease without esophagitis: Secondary | ICD-10-CM | POA: Diagnosis not present

## 2023-10-04 DIAGNOSIS — J45909 Unspecified asthma, uncomplicated: Secondary | ICD-10-CM | POA: Diagnosis not present

## 2023-10-04 DIAGNOSIS — Z853 Personal history of malignant neoplasm of breast: Secondary | ICD-10-CM | POA: Diagnosis not present

## 2023-10-04 DIAGNOSIS — G8929 Other chronic pain: Secondary | ICD-10-CM

## 2023-10-04 DIAGNOSIS — E785 Hyperlipidemia, unspecified: Secondary | ICD-10-CM | POA: Diagnosis not present

## 2023-10-04 DIAGNOSIS — E038 Other specified hypothyroidism: Secondary | ICD-10-CM

## 2023-10-04 DIAGNOSIS — M545 Low back pain, unspecified: Secondary | ICD-10-CM | POA: Diagnosis not present

## 2023-10-04 DIAGNOSIS — E663 Overweight: Secondary | ICD-10-CM

## 2023-10-04 DIAGNOSIS — K754 Autoimmune hepatitis: Secondary | ICD-10-CM | POA: Diagnosis not present

## 2023-10-04 MED ORDER — FLUTICASONE-SALMETEROL 115-21 MCG/ACT IN AERO: 2.0000 | INHALATION_SPRAY | Freq: Two times a day (BID) | RESPIRATORY_TRACT | 2 refills | Status: AC

## 2023-10-04 MED ORDER — MYCOPHENOLATE MOFETIL 500 MG PO TABS: 500.0000 mg | ORAL_TABLET | Freq: Two times a day (BID) | ORAL | 1 refills | Status: AC

## 2023-10-04 MED ORDER — LEVOTHYROXINE SODIUM 75 MCG PO TABS: 75.0000 ug | ORAL_TABLET | Freq: Every day | ORAL | 1 refills | Status: AC

## 2023-10-04 MED ORDER — ALBUTEROL SULFATE HFA 108 (90 BASE) MCG/ACT IN AERS: 2.0000 | INHALATION_SPRAY | Freq: Four times a day (QID) | RESPIRATORY_TRACT | 3 refills | Status: AC | PRN

## 2023-10-04 NOTE — Patient Instructions (Signed)
 Health Maintenance After Age 78 After age 27, you are at a higher risk for certain long-term diseases and infections as well as injuries from falls. Falls are a major cause of broken bones and head injuries in people who are older than age 73. Getting regular preventive care can help to keep you healthy and well. Preventive care includes getting regular testing and making lifestyle changes as recommended by your health care provider. Talk with your health care provider about: Which screenings and tests you should have. A screening is a test that checks for a disease when you have no symptoms. A diet and exercise plan that is right for you. What should I know about screenings and tests to prevent falls? Screening and testing are the best ways to find a health problem early. Early diagnosis and treatment give you the best chance of managing medical conditions that are common after age 90. Certain conditions and lifestyle choices may make you more likely to have a fall. Your health care provider may recommend: Regular vision checks. Poor vision and conditions such as cataracts can make you more likely to have a fall. If you wear glasses, make sure to get your prescription updated if your vision changes. Medicine review. Work with your health care provider to regularly review all of the medicines you are taking, including over-the-counter medicines. Ask your health care provider about any side effects that may make you more likely to have a fall. Tell your health care provider if any medicines that you take make you feel dizzy or sleepy. Strength and balance checks. Your health care provider may recommend certain tests to check your strength and balance while standing, walking, or changing positions. Foot health exam. Foot pain and numbness, as well as not wearing proper footwear, can make you more likely to have a fall. Screenings, including: Osteoporosis screening. Osteoporosis is a condition that causes  the bones to get weaker and break more easily. Blood pressure screening. Blood pressure changes and medicines to control blood pressure can make you feel dizzy. Depression screening. You may be more likely to have a fall if you have a fear of falling, feel depressed, or feel unable to do activities that you used to do. Alcohol  use screening. Using too much alcohol  can affect your balance and may make you more likely to have a fall. Follow these instructions at home: Lifestyle Do not drink alcohol  if: Your health care provider tells you not to drink. If you drink alcohol : Limit how much you have to: 0-1 drink a day for women. 0-2 drinks a day for men. Know how much alcohol  is in your drink. In the [place] one drink equals one 12 oz bottle of beer (355 mL), one 5 oz glass of wine (148 mL), or one 1 oz glass of hard liquor (44 mL). Do not use any products that contain nicotine or tobacco. These products include cigarettes, chewing tobacco, and vaping devices, such as e-cigarettes. If you need help quitting, ask your health care provider. Activity  Follow a regular exercise program to stay fit. This will help you maintain your balance. Ask your health care provider what types of exercise are appropriate for you. If you need a cane or walker, use it as recommended by your health care provider. Wear supportive shoes that have nonskid soles. Safety  Remove any tripping hazards, such as rugs, cords, and clutter. Install safety equipment such as grab bars in bathrooms and safety rails on stairs. Keep rooms and walkways  well-lit. General instructions Talk with your health care provider about your risks for falling. Tell your health care provider if: You fall. Be sure to tell your health care provider about all falls, even ones that seem minor. You feel dizzy, tiredness (fatigue), or off-balance. Take over-the-counter and prescription medicines only as told by your health care provider. These include  supplements. Eat a healthy diet and maintain a healthy weight. A healthy diet includes low-fat dairy products, low-fat (lean) meats, and fiber from whole grains, beans, and lots of fruits and vegetables. Stay current with your vaccines. Schedule regular health, dental, and eye exams. Summary Having a healthy lifestyle and getting preventive care can help to protect your health and wellness after age 15. Screening and testing are the best way to find a health problem early and help you avoid having a fall. Early diagnosis and treatment give you the best chance for managing medical conditions that are more common for people who are older than age 42. Falls are a major cause of broken bones and head injuries in people who are older than age 64. Take precautions to prevent a fall at home. Work with your health care provider to learn what changes you can make to improve your health and wellness and to prevent falls. This information is not intended to replace advice given to you by your health care provider. Make sure you discuss any questions you have with your health care provider. Document Revised: 05/16/2020 Document Reviewed: 05/16/2020 Elsevier Patient Education  2024 [employer].

## 2023-10-04 NOTE — Progress Notes (Signed)
 Subjective:    Patient ID: Cheryl Perry, female    DOB: Feb 12, 1945, 78 y.o.   MRN: [id number]  Chief Complaint  Patient presents with   Medical Management of Chronic Issues   Pt presents to the office today for chronic follow up.  Her husband is currently on hospice. She is his caregiver. She lost her son in 04/2023.   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 She is currently taking Cellcept  and doing well.    Pt is followed by allergen specialists annually for her asthma.   PT is followed by Ortho as needed for chronic back pain and left knee pain.   She has history of right breast cancer. She completed radiation in 10/29/17.    She has osteopenia and takes calcium  and vit D. She walking 30 mins six days a week.   Asthma She complains of frequent throat clearing, hoarse voice and wheezing. There is no cough or shortness of breath. This is a chronic problem. The current episode started more than 1 year ago. The problem occurs intermittently. Associated symptoms include heartburn. Her symptoms are aggravated by pollen. Her symptoms are alleviated by rest. She reports moderate improvement on treatment. Her symptoms are not alleviated by rest and beta-agonist. Her past medical history is significant for asthma.  Gastroesophageal Reflux She complains of belching, heartburn, a hoarse voice and wheezing. She reports no coughing. This is a chronic problem. The current episode started more than 1 year ago. The problem occurs rarely. The symptoms are aggravated by certain foods. Associated symptoms include fatigue. Risk factors include obesity. She has tried a PPI for the symptoms. The treatment provided moderate relief.  Thyroid  Problem Presents for follow-up visit. Symptoms include dry skin, fatigue, hair loss and hoarse voice. Patient reports no anxiety, constipation or diaphoresis. The symptoms have  been stable.  Hyperlipidemia This is a chronic problem. The current episode started more than 1 year ago. The problem is uncontrolled. Recent lipid tests were reviewed and are high. Exacerbating diseases include obesity. Pertinent negatives include no shortness of breath. Current antihyperlipidemic treatment includes diet change. The current treatment provides mild improvement of lipids. Risk factors for coronary artery disease include dyslipidemia, hypertension, a sedentary lifestyle and post-menopausal.  Back Pain This is a chronic problem. The current episode started more than 1 year ago. The problem occurs intermittently. The problem has been waxing and waning since onset. The pain is present in the lumbar spine. The quality of the pain is described as aching. The pain is at a severity of 2/10. The pain is mild. The symptoms are aggravated by standing. She has tried home exercises for the symptoms. The treatment provided mild relief.      Review of Systems  Constitutional:  Positive for fatigue. Negative for diaphoresis.  HENT:  Positive for hoarse voice.   Respiratory:  Positive for wheezing. Negative for cough and shortness of breath.   Gastrointestinal:  Positive for heartburn. Negative for constipation.  Musculoskeletal:  Positive for back pain.  Psychiatric/Behavioral:  The patient is not nervous/anxious.   All other systems reviewed and are negative.      Objective:   Physical Exam Vitals reviewed.  Constitutional:      General: She is not in acute distress.    Appearance: She is well-developed. She is obese.  HENT:     Head: Normocephalic and atraumatic.  Right Ear: There is impacted cerumen.     Left Ear: Tympanic membrane normal.  Eyes:     Pupils: Pupils are equal, round, and reactive to light.  Neck:     Thyroid : No thyromegaly.  Cardiovascular:     Rate and Rhythm: Normal rate and regular rhythm.     Heart sounds: Normal heart sounds. No murmur  heard. Pulmonary:     Effort: Pulmonary effort is normal. No respiratory distress.     Breath sounds: Normal breath sounds. No wheezing.  Abdominal:     General: Bowel sounds are normal. There is no distension.     Palpations: Abdomen is soft.     Tenderness: There is no abdominal tenderness.  Musculoskeletal:        General: No tenderness. Normal range of motion.     Cervical back: Normal range of motion and neck supple.  Skin:    General: Skin is warm and dry.  Neurological:     Mental Status: She is alert and oriented to person, place, and time.     Cranial Nerves: No cranial nerve deficit.     Deep Tendon Reflexes: Reflexes are normal and symmetric.  Psychiatric:        Behavior: Behavior normal.        Thought Content: Thought content normal.        Judgment: Judgment normal.          BP 110/76   Pulse 64   Temp (!) 97 F (36.1 C) (Temporal)   Ht 5' 5 (1.651 m)   Wt 156 lb 6.4 oz (70.9 kg)   SpO2 95%   BMI 26.03 kg/m   Assessment & Plan:   Cheryl Perry comes in today with chief complaint of Medical Management of Chronic Issues   Diagnosis and orders addressed:  1. Uncomplicated asthma, unspecified asthma severity, unspecified whether persistent - albuterol  (PROVENTIL  HFA) 108 (90 Base) MCG/ACT inhaler; Inhale 2 puffs into the lungs every 6 (six) hours as needed for wheezing. May use only 3 times a month  Dispense: 18 g; Refill: 3 - fluticasone -salmeterol (ADVAIR HFA) 115-21 MCG/ACT inhaler; Inhale 2 puffs into the lungs 2 (two) times daily.  Dispense: 3 each; Refill: 2 - CBC with Differential/Platelet - CMP14+EGFR  2. Other specified hypothyroidism (Primary) - levothyroxine  (SYNTHROID ) 75 MCG tablet; Take 1 tablet (75 mcg total) by mouth daily before breakfast.  Dispense: 90 tablet; Refill: 1 - CBC with Differential/Platelet - CMP14+EGFR - TSH  3. Autoimmune hepatitis (HCC) - mycophenolate  (CELLCEPT ) 500 MG tablet; Take 1 tablet (500 mg total) by  mouth 2 (two) times daily. Patient takes 500 mg in the morning and 500 mg at night.  Dispense: 180 tablet; Refill: 1 - CBC with Differential/Platelet - CMP14+EGFR  4. Hyperlipidemia, unspecified hyperlipidemia type - CBC with Differential/Platelet - CMP14+EGFR  5. Gastroesophageal reflux disease without esophagitis  - CBC with Differential/Platelet - CMP14+EGFR  6. Overweight (BMI 25.0-29.9) - CBC with Differential/Platelet - CMP14+EGFR  7. Chronic bilateral low back pain without sciatica - CBC with Differential/Platelet - CMP14+EGFR  8. History of breast cancer - CBC with Differential/Platelet - CMP14+EGFR  9. Osteopenia of multiple sites - CBC with Differential/Platelet - CMP14+EGFR  Labs pending Keep follow up with specialists Continue current medications  Health Maintenance reviewed Diet and exercise encouraged  Follow up plan: 6 months    Bari Learn, FNP

## 2023-10-05 LAB — CBC WITH DIFFERENTIAL/PLATELET
Basophils Absolute: 0 x10E3/uL (ref 0.0–0.2)
Basos: 1 %
EOS (ABSOLUTE): 0.1 x10E3/uL (ref 0.0–0.4)
Eos: 2 %
Hematocrit: 44.3 % (ref 34.0–46.6)
Hemoglobin: 14.4 g/dL (ref 11.1–15.9)
Immature Grans (Abs): 0.1 x10E3/uL (ref 0.0–0.1)
Immature Granulocytes: 2 %
Lymphocytes Absolute: 1.4 x10E3/uL (ref 0.7–3.1)
Lymphs: 19 %
MCH: 32.6 pg (ref 26.6–33.0)
MCHC: 32.5 g/dL (ref 31.5–35.7)
MCV: 100 fL — ABNORMAL HIGH (ref 79–97)
Monocytes Absolute: 0.8 x10E3/uL (ref 0.1–0.9)
Monocytes: 10 %
Neutrophils Absolute: 5 x10E3/uL (ref 1.4–7.0)
Neutrophils: 66 %
Platelets: 245 x10E3/uL (ref 150–450)
RBC: 4.42 x10E6/uL (ref 3.77–5.28)
RDW: 12 % (ref 11.7–15.4)
WBC: 7.4 x10E3/uL (ref 3.4–10.8)

## 2023-10-05 LAB — CMP14+EGFR
ALT: 14 IU/L (ref 0–32)
AST: 13 IU/L (ref 0–40)
Albumin: 3.9 g/dL (ref 3.8–4.8)
Alkaline Phosphatase: 65 IU/L (ref 49–135)
BUN/Creatinine Ratio: 11 — ABNORMAL LOW (ref 12–28)
BUN: 10 mg/dL (ref 8–27)
Bilirubin Total: 0.5 mg/dL (ref 0.0–1.2)
CO2: 24 mmol/L (ref 20–29)
Calcium: 9.2 mg/dL (ref 8.7–10.3)
Chloride: 105 mmol/L (ref 96–106)
Creatinine, Ser: 0.87 mg/dL (ref 0.57–1.00)
Globulin, Total: 1.8 g/dL (ref 1.5–4.5)
Glucose: 84 mg/dL (ref 70–99)
Potassium: 4.5 mmol/L (ref 3.5–5.2)
Sodium: 143 mmol/L (ref 134–144)
Total Protein: 5.7 g/dL — ABNORMAL LOW (ref 6.0–8.5)
eGFR: 68 mL/min/1.73 (ref >59)

## 2023-10-05 LAB — TSH: TSH: 3.09 u[IU]/mL (ref 0.450–4.500)

## 2023-10-07 ENCOUNTER — Ambulatory Visit: Payer: Self-pay | Admitting: Family

## 2023-11-07 DIAGNOSIS — Z23 Encounter for immunization: Secondary | ICD-10-CM | POA: Diagnosis not present

## 2023-11-11 NOTE — Progress Notes (Unsigned)
 Office Visit Note  Patient: Cheryl Perry             Date of Birth: 03-Jan-1946           MRN: [id number]             PCP: Lavell Bari LABOR, FNP Referring: Lavell Bari LABOR, FNP Visit Date: 11/14/2023 Occupation: Data Unavailable  Subjective:  Pain in right knee  History of Present Illness: Cheryl Perry is a 78 y.o. female with osteoarthritis, chondrocalcinosis and degenerative disc disease.  She returns today after her last visit in August 2025.  She states she has had a lot of stress recently.  She lost her husband in October.  She has been running back-and-forth and also has been his caregiver.  She has been having pain and discomfort in her right knee joint for the last 3 months.  She had cortisone injection in her right knee on August 13, 2023 which helped temporarily and then the pain came back.  None of the other joints are painful.  The neck pain is not as bad.  However she continues to have lower back pain.  She does not have much discomfort in her hands and feet.  She continues to be on CellCept  for autoimmune hepatitis.    Activities of Daily Living:  Patient reports morning stiffness for 30 minutes.   Patient Reports nocturnal pain.  Difficulty dressing/grooming: Reports Difficulty climbing stairs: Reports Difficulty getting out of chair: Reports Difficulty using hands for taps, buttons, cutlery, and/or writing: Denies  Review of Systems  Constitutional:  Positive for fatigue.  HENT:  Negative for mouth sores and mouth dryness.   Eyes:  Negative for dryness.  Respiratory:  Negative for shortness of breath.   Cardiovascular:  Negative for chest pain and palpitations.  Gastrointestinal:  Negative for blood in stool, constipation and diarrhea.  Endocrine: Negative for increased urination.  Genitourinary:  Negative for involuntary urination.  Musculoskeletal:  Positive for joint pain, gait problem, joint pain, myalgias, muscle weakness, morning stiffness, muscle  tenderness and myalgias. Negative for joint swelling.  Skin:  Negative for color change, rash, hair loss and sensitivity to sunlight.  Allergic/Immunologic: Negative for susceptible to infections.  Neurological:  Negative for dizziness and headaches.  Hematological:  Negative for swollen glands.  Psychiatric/Behavioral:  Positive for sleep disturbance. Negative for depressed mood. The patient is not nervous/anxious.     PMFS History:  Patient Active Problem List   Diagnosis Date Noted   History of breast cancer 03/07/2021   Autoimmune hepatitis (HCC) 01/12/2019   Elevated liver function tests 12/25/2017   H/O adenomatous polyp of colon 12/12/2017   Back pain 12/14/2016   Overweight (BMI 25.0-29.9) 06/14/2015   Gastric ulceration    Hiatal hernia    History of lower GI bleeding    UGI bleed 01/27/2015   GERD (gastroesophageal reflux disease) 06/15/2014   Osteopenia 09/02/2013   Hypothyroidism 06/05/2012   HLD (hyperlipidemia) 06/05/2012   Asthma 06/05/2012    Past Medical History:  Diagnosis Date   Allergy    Arthritis    back painherniated disc lumbar   Asthma    Autoimmune hepatitis (HCC)    Back pain    Cancer (HCC) 07/2017   right breast cancer, lumpectomy and 20 sessions of XRT   Complication of anesthesia    GERD (gastroesophageal reflux disease)    Hiatal hernia    Hyperlipidemia    Hypothyroidism    Osteopenia    PONV (  postoperative nausea and vomiting)    asks for scop patch   Thyroid  disease    Vasculopathy    lymphatic    Family History  Problem Relation Age of Onset   Stroke Mother    Hypertension Mother    Alzheimer's disease Mother    Heart disease Sister    Hypertension Sister    Heart attack Sister        pace maker    Arthritis Daughter        very bad MVA    Hypertension Son    Seizures Son    Colon cancer Neg Hx    Liver cancer Neg Hx    Pancreatic disease Neg Hx    Past Surgical History:  Procedure Laterality Date   BASAL CELL  CARCINOMA EXCISION     BREAST LUMPECTOMY WITH RADIOACTIVE SEED AND SENTINEL LYMPH NODE BIOPSY Right 08/22/2017   Procedure: BREAST LUMPECTOMY WITH RADIOACTIVE SEED AND SENTINEL LYMPH NODE BIOPSY;  Surgeon: Vanderbilt Ned, MD;  Location: [facility];  Service: General;  Laterality: Right;   CATARACT EXTRACTION     COLONOSCOPY  2015   Dr. Luis: Hemorrhoids, diverticulosis.  Next colonoscopy in 5 years due to previous history of adenomatous colon polyps   COLONOSCOPY N/A 11/18/2019   Procedure: COLONOSCOPY;  Surgeon: Shaaron Lamar HERO, MD;  Location: [facility];  Service: Endoscopy;  Laterality: N/A;  9:30   CYSTOSCOPY     cystscopy     ESOPHAGOGASTRODUODENOSCOPY N/A 01/28/2015   Dr.Rourk- 3 small prepyloric/gastric ulcers- likely culprits causing bleeding. hiatal hernia, small gastic polyps not manipulated.   ESOPHAGOGASTRODUODENOSCOPY N/A 05/26/2015   Dr. Shaaron: Small hiatal hernia, previous gastric ulcers completely healed.   KNEE SURGERY     LIVER BIOPSY  10/09/2018   TONSILLECTOMY     TUBAL LIGATION     Social History   Tobacco Use   Smoking status: Former    Current packs/day: 0.00    Types: Cigarettes    Quit date: 06/06/1974    Years since quitting: 49.4    Passive exposure: Past   Smokeless tobacco: Never   Tobacco comments:    Quit x 40 years ago  Vaping Use   Vaping status: Never Used  Substance Use Topics   Alcohol use: No    Alcohol/week: 0.0 standard drinks of alcohol   Drug use: No   Social History   Social History Narrative   Son in [place], granddaughter in [place]   Very socially active     Immunization History  Administered Date(s) Administered   DTaP 10/28/2014   Fluad Quad(high Dose 65+) 10/13/2021   Hepatitis A 04/03/2016   INFLUENZA, HIGH DOSE SEASONAL PF 10/28/2014, 10/11/2015, 10/10/2016, 10/14/2017, 03/20/2018, 10/17/2018, 03/23/2019, 10/27/2019, 09/05/2020, 11/07/2023   Influenza,inj,Quad PF,6+ Mos 10/18/2014    Influenza,inj,quad, With Preservative 10/09/2018   Influenza-Unspecified 12/09/2012, 11/09/2013, 10/09/2022   Moderna Covid-19 Fall Seasonal Vaccine 26yrs & older 12/01/2021   Moderna Covid-19 Vaccine Bivalent Booster 21yrs & up 10/10/2020   Moderna Sars-Covid-2 Vaccination 02/20/2019, 03/21/2019, 03/23/2019, 09/25/2019   PNEUMOCOCCAL CONJUGATE-20 09/29/2020   Pneumococcal Conjugate-13 12/09/2013   Pneumococcal Polysaccharide-23 05/04/2011, 10/22/2013, 10/28/2014, 03/20/2018, 10/27/2019   Pneumococcal-Unspecified 09/28/2020   Respiratory Syncytial Virus Vaccine,Recomb Aduvanted(Arexvy) 01/02/2022   Tdap 06/09/2003, 06/09/2013   Typhoid Live 04/03/2016   Zoster Recombinant(Shingrix) 11/21/2020, 12/19/2020, 03/07/2021   Zoster, Live 04/21/2010     Objective: Vital Signs: BP 130/83   Pulse 62   Temp (!) 97.4 F (36.3 C)   Resp  16   Ht 5' 5 (1.651 m)   Wt 156 lb 6.4 oz (70.9 kg)   BMI 26.03 kg/m    Physical Exam Vitals and nursing note reviewed.  Constitutional:      Appearance: She is well-developed.  HENT:     Head: Normocephalic and atraumatic.  Eyes:     Conjunctiva/sclera: Conjunctivae normal.  Cardiovascular:     Rate and Rhythm: Normal rate and regular rhythm.     Heart sounds: Normal heart sounds.  Pulmonary:     Effort: Pulmonary effort is normal.     Breath sounds: Normal breath sounds.  Abdominal:     General: Bowel sounds are normal.     Palpations: Abdomen is soft.  Musculoskeletal:     Cervical back: Normal range of motion.  Lymphadenopathy:     Cervical: No cervical adenopathy.  Skin:    General: Skin is warm and dry.     Capillary Refill: Capillary refill takes less than 2 seconds.  Neurological:     Mental Status: She is alert and oriented to person, place, and time.  Psychiatric:        Behavior: Behavior normal.      Musculoskeletal Exam: She had limited range of motion of the cervical spine.  Thoracic kyphosis was noted.  She had discomfort  range of motion of her lumbar spine and had limited mobility.   There was no SI joint tenderness.  Shoulder joints, elbow joints, wrist joints, MCPs, PIPs and DIPs were in good range of motion with no synovitis.  Bilateral CMC, PIP and DIP thickening was noted.  Hip joints and knee joints were in good range of motion.  Warmth and swelling was noted in the right knee joint with small effusion.  There was no tenderness over ankles or MTPs.   CDAI Exam: CDAI Score: -- Patient Global: --; Provider Global: -- Swollen: --; Tender: -- Joint Exam 11/14/2023   No joint exam has been documented for this visit   There is currently no information documented on the homunculus. Go to the Rheumatology activity and complete the homunculus joint exam.  Investigation: No additional findings.  Imaging: No results found.  Recent Labs: Lab Results  Component Value Date   WBC 7.4 10/04/2023   HGB 14.4 10/04/2023   PLT 245 10/04/2023   NA 143 10/04/2023   K 4.5 10/04/2023   CL 105 10/04/2023   CO2 24 10/04/2023   GLUCOSE 84 10/04/2023   BUN 10 10/04/2023   CREATININE 0.87 10/04/2023   BILITOT 0.5 10/04/2023   ALKPHOS 65 10/04/2023   AST 13 10/04/2023   ALT 14 10/04/2023   PROT 5.7 (L) 10/04/2023   ALBUMIN 3.9 10/04/2023   CALCIUM  9.2 10/04/2023   GFRAA 84 09/03/2019    Speciality Comments: Imuran-discontinued due to cholestatic liver injury and upper GI side effects.  Procedures:  Large Joint Inj: R knee on 11/14/2023 1:50 PM Indications: pain Details: 27 G 1.5 in needle, medial approach  Arthrogram: No  Medications: 40 mg triamcinolone  acetonide 40 MG/ML; 3 mL lidocaine  1 % Aspirate: 16 mL clear Outcome: tolerated well, no immediate complications  Risk of infection, tendon injury, nerve injury, dermal atrophy and hypopigmentation were discussed Procedure, treatment alternatives, risks and benefits explained, specific risks discussed. Consent was given by the patient. Immediately prior  to procedure a time out was called to verify the correct patient, procedure, equipment, support staff and site/side marked as required. Patient was prepped and draped in the usual sterile  fashion.     Allergies: Azathioprine, Statins, Sulfa antibiotics, Methocarbamol , Naproxen , Lipitor [atorvastatin], Premarin [conjugated estrogens], Ampicillin, Avelox [moxifloxacin hcl in nacl], Azithromycin , Bactrim [sulfamethoxazole-trimethoprim], Crestor  [rosuvastatin  calcium ], Doxycycline , Levaquin [levofloxacin in d5w], Livalo  [pitavastatin ], Macrodantin [nitrofurantoin macrocrystal], and Trental [pentoxifylline er]   Assessment / Plan:     Visit Diagnoses: Primary osteoarthritis of both hands-lateral PIP, DIP and CMC thickening was noted.  Joint protection muscle strengthening was discussed.  Chronic pain of right knee - Right knee injection 02/12/23, 08/13/2023.  Effusion right knee-right knee effusion was noted with warmth and swelling.  Patient has chondrocalcinosis which possibly caused knee inflammation.  After informed consent was obtained and side effects were discussed with the right knee joint was prepped in sterile fashion.  Synovial fluid was aspirated which was sent for analysis.  The joint was injected with 40 mg of Kenalog  which she tolerated well.  Postprocedure instructions were given.  Chondrocalcinosis of right knee -she has recurrence of right knee joint pain and effusion.  Patient states she lost her husband in October and had to do a lot of activities after that.  Has been strenuous for her.  X-rays mild osteoarthritis, moderate chondromalacia patella and chondrocalcinosis.  Primary osteoarthritis of both feet-she had bilateral PIP and DIP thickening.  DDD (degenerative disc disease), cervical-she had limited range of motion without much discomfort.  DDD (degenerative disc disease), thoracic - Thoracic kyphosis noted.  Degeneration of intervertebral disc of lumbar region without  discogenic back pain or lower extremity pain-she continues to have some lower back pain.  Osteopenia of multiple sites - Previous DEXA 03/07/20 T score -2.3 RFN.DEXA 09/06/2022: Lumbar BMD 1.026 with T-score -1.3.  LFN T-score -1.5, right femoral neck BMD 0.709 with T-score -2.4.  She does not want to take bisphosphonates.  She states she will discuss with her [facility] hepatologist.  Autoimmune hepatitis (HCC) - Bx positive on 10/02/18.  She is followed by Dr. Berta at [facility].  She remains on Cellcept  500 mg 1 tablet by mouth twice daily.  High risk medication use - CellCept  500 mg 1 tablet by mouth twice daily prescribed by GI.  Labs are followed by her hepatologist.  Positive ANA (antinuclear antibody) - AVISE index -0.4, ANA titer negative, RF 18, TPO antibody positive.  She has no other clinical features of autoimmune disease.  Vitamin D  deficiency-vitamin D  was normal in February 2023.  Other medical problems are listed as follows:  Malignant neoplasm of upper-outer quadrant of right breast in female, estrogen receptor positive (HCC)  H/O adenomatous polyp of colon  History of hyperlipidemia  History of asthma  History of gastroesophageal reflux (GERD)  History of lower GI bleeding  History of hypothyroidism  Hiatal hernia  Orders: Orders Placed This Encounter  Procedures   Large Joint Inj   Synovial Fluid Analysis, Complete   No orders of the defined types were placed in this encounter.    Follow-Up Instructions: Return in about 5 months (around 04/13/2024) for Osteoarthritis.   Maya Nash, MD  Note - This record has been created using Animal nutritionist.  Chart creation errors have been sought, but may not always  have been located. Such creation errors do not reflect on  the standard of medical care.

## 2023-11-14 ENCOUNTER — Ambulatory Visit: Attending: Rheumatology | Admitting: Rheumatology

## 2023-11-14 ENCOUNTER — Encounter: Payer: Self-pay | Admitting: Rheumatology

## 2023-11-14 VITALS — BP 130/83 | HR 62 | Temp 97.4°F | Resp 16 | Ht 65.0 in | Wt 156.4 lb

## 2023-11-14 DIAGNOSIS — K449 Diaphragmatic hernia without obstruction or gangrene: Secondary | ICD-10-CM | POA: Insufficient documentation

## 2023-11-14 DIAGNOSIS — M25561 Pain in right knee: Secondary | ICD-10-CM | POA: Insufficient documentation

## 2023-11-14 DIAGNOSIS — M19072 Primary osteoarthritis, left ankle and foot: Secondary | ICD-10-CM | POA: Diagnosis not present

## 2023-11-14 DIAGNOSIS — Z8639 Personal history of other endocrine, nutritional and metabolic disease: Secondary | ICD-10-CM | POA: Diagnosis not present

## 2023-11-14 DIAGNOSIS — M19042 Primary osteoarthritis, left hand: Secondary | ICD-10-CM | POA: Diagnosis not present

## 2023-11-14 DIAGNOSIS — Z79899 Other long term (current) drug therapy: Secondary | ICD-10-CM | POA: Insufficient documentation

## 2023-11-14 DIAGNOSIS — K754 Autoimmune hepatitis: Secondary | ICD-10-CM | POA: Insufficient documentation

## 2023-11-14 DIAGNOSIS — M51369 Other intervertebral disc degeneration, lumbar region without mention of lumbar back pain or lower extremity pain: Secondary | ICD-10-CM | POA: Diagnosis not present

## 2023-11-14 DIAGNOSIS — M8589 Other specified disorders of bone density and structure, multiple sites: Secondary | ICD-10-CM | POA: Insufficient documentation

## 2023-11-14 DIAGNOSIS — M5030 Other cervical disc degeneration, unspecified cervical region: Secondary | ICD-10-CM | POA: Diagnosis not present

## 2023-11-14 DIAGNOSIS — G8929 Other chronic pain: Secondary | ICD-10-CM | POA: Insufficient documentation

## 2023-11-14 DIAGNOSIS — Z8709 Personal history of other diseases of the respiratory system: Secondary | ICD-10-CM | POA: Insufficient documentation

## 2023-11-14 DIAGNOSIS — M11261 Other chondrocalcinosis, right knee: Secondary | ICD-10-CM | POA: Insufficient documentation

## 2023-11-14 DIAGNOSIS — M19071 Primary osteoarthritis, right ankle and foot: Secondary | ICD-10-CM | POA: Insufficient documentation

## 2023-11-14 DIAGNOSIS — C50411 Malignant neoplasm of upper-outer quadrant of right female breast: Secondary | ICD-10-CM | POA: Insufficient documentation

## 2023-11-14 DIAGNOSIS — M19041 Primary osteoarthritis, right hand: Secondary | ICD-10-CM | POA: Insufficient documentation

## 2023-11-14 DIAGNOSIS — Z170 Estrogen receptor positive status [ER+]: Secondary | ICD-10-CM | POA: Diagnosis not present

## 2023-11-14 DIAGNOSIS — M25461 Effusion, right knee: Secondary | ICD-10-CM | POA: Diagnosis not present

## 2023-11-14 DIAGNOSIS — E559 Vitamin D deficiency, unspecified: Secondary | ICD-10-CM | POA: Insufficient documentation

## 2023-11-14 DIAGNOSIS — R7689 Other specified abnormal immunological findings in serum: Secondary | ICD-10-CM | POA: Insufficient documentation

## 2023-11-14 DIAGNOSIS — M5134 Other intervertebral disc degeneration, thoracic region: Secondary | ICD-10-CM | POA: Insufficient documentation

## 2023-11-14 DIAGNOSIS — Z8719 Personal history of other diseases of the digestive system: Secondary | ICD-10-CM | POA: Insufficient documentation

## 2023-11-14 DIAGNOSIS — Z860101 Personal history of adenomatous and serrated colon polyps: Secondary | ICD-10-CM | POA: Diagnosis not present

## 2023-11-14 LAB — SYNOVIAL FLUID ANALYSIS, COMPLETE
Basophils, %: 0 %
Eosinophils-Synovial: 0 % (ref 0–2)
Lymphocytes-Synovial Fld: 32 % (ref 0–74)
Monocyte/Macrophage: 52 % (ref 0–69)
Neutrophil, Synovial: 13 % (ref 0–24)
Synoviocytes, %: 3 % (ref 0–15)
WBC, Synovial: 201 {cells}/uL — ABNORMAL HIGH (ref <150)

## 2023-11-14 MED ORDER — TRIAMCINOLONE ACETONIDE 40 MG/ML IJ SUSP
40.0000 mg | INTRAMUSCULAR | Status: AC | PRN
  Administered 2023-11-14: 40 mg via INTRA_ARTICULAR

## 2023-11-14 MED ORDER — LIDOCAINE HCL 1 % IJ SOLN
3.0000 mL | INTRAMUSCULAR | Status: AC | PRN
  Administered 2023-11-14: 3 mL

## 2023-11-15 ENCOUNTER — Ambulatory Visit: Payer: Self-pay | Admitting: Rheumatology

## 2023-11-15 NOTE — Progress Notes (Signed)
 Synovial fluid is not inflammatory and crystals were negative.

## 2023-12-11 ENCOUNTER — Other Ambulatory Visit (HOSPITAL_BASED_OUTPATIENT_CLINIC_OR_DEPARTMENT_OTHER): Payer: Self-pay | Admitting: Internal Medicine

## 2023-12-11 DIAGNOSIS — K754 Autoimmune hepatitis: Secondary | ICD-10-CM

## 2023-12-16 DIAGNOSIS — H02831 Dermatochalasis of right upper eyelid: Secondary | ICD-10-CM | POA: Diagnosis not present

## 2023-12-16 DIAGNOSIS — H43813 Vitreous degeneration, bilateral: Secondary | ICD-10-CM | POA: Diagnosis not present

## 2023-12-16 DIAGNOSIS — Z961 Presence of intraocular lens: Secondary | ICD-10-CM | POA: Diagnosis not present

## 2023-12-16 DIAGNOSIS — H04123 Dry eye syndrome of bilateral lacrimal glands: Secondary | ICD-10-CM | POA: Diagnosis not present

## 2023-12-16 DIAGNOSIS — H02834 Dermatochalasis of left upper eyelid: Secondary | ICD-10-CM | POA: Diagnosis not present

## 2023-12-16 DIAGNOSIS — H17821 Peripheral opacity of cornea, right eye: Secondary | ICD-10-CM | POA: Diagnosis not present

## 2024-01-16 ENCOUNTER — Ambulatory Visit (HOSPITAL_BASED_OUTPATIENT_CLINIC_OR_DEPARTMENT_OTHER)
Admission: RE | Admit: 2024-01-16 | Discharge: 2024-01-16 | Disposition: A | Discharge status: Home / Self Care | Source: Ambulatory Visit | Attending: Internal Medicine | Admitting: Internal Medicine

## 2024-01-16 DIAGNOSIS — K754 Autoimmune hepatitis: Secondary | ICD-10-CM | POA: Insufficient documentation

## 2024-01-16 MED ORDER — IOHEXOL 300 MG/ML  SOLN
100.0000 mL | Freq: Once | INTRAMUSCULAR | Status: AC | PRN
  Administered 2024-01-16: 100 mL via INTRAVENOUS

## 2024-01-30 NOTE — Progress Notes (Unsigned)
 "  Office Visit Note  Patient: Cheryl Perry             Date of Birth: 01-Jan-1946           MRN: [id number]             PCP: Lavell Bari LABOR, FNP Referring: Lavell Bari LABOR, FNP Visit Date: 02/13/2024 Occupation: Data Unavailable  Subjective:  No chief complaint on file.   History of Present Illness: Cheryl Perry is a 79 y.o. female ***     Activities of Daily Living:  Patient reports morning stiffness for *** {minute/hour:19697}.   Patient {ACTIONS;DENIES/REPORTS:21021675::Denies} nocturnal pain.  Difficulty dressing/grooming: {ACTIONS;DENIES/REPORTS:21021675::Denies} Difficulty climbing stairs: {ACTIONS;DENIES/REPORTS:21021675::Denies} Difficulty getting out of chair: {ACTIONS;DENIES/REPORTS:21021675::Denies} Difficulty using hands for taps, buttons, cutlery, and/or writing: {ACTIONS;DENIES/REPORTS:21021675::Denies}  No Rheumatology ROS completed.   PMFS History:  Patient Active Problem List   Diagnosis Date Noted   History of breast cancer 03/07/2021   Autoimmune hepatitis (HCC) 01/12/2019   Elevated liver function tests 12/25/2017   H/O adenomatous polyp of colon 12/12/2017   Back pain 12/14/2016   Overweight (BMI 25.0-29.9) 06/14/2015   Gastric ulceration    Hiatal hernia    History of lower GI bleeding    UGI bleed 01/27/2015   GERD (gastroesophageal reflux disease) 06/15/2014   Osteopenia 09/02/2013   Hypothyroidism 06/05/2012   HLD (hyperlipidemia) 06/05/2012   Asthma 06/05/2012    Past Medical History:  Diagnosis Date   Allergy    Arthritis    back painherniated disc lumbar   Asthma    Autoimmune hepatitis (HCC)    Back pain    Cancer (HCC) 07/2017   right breast cancer, lumpectomy and 20 sessions of XRT   Complication of anesthesia    GERD (gastroesophageal reflux disease)    Hiatal hernia    Hyperlipidemia    Hypothyroidism    Osteopenia    PONV (postoperative nausea and vomiting)    asks for scop patch   Thyroid  disease     Vasculopathy    lymphatic    Family History  Problem Relation Age of Onset   Stroke Mother    Hypertension Mother    Alzheimer's disease Mother    Heart disease Sister    Hypertension Sister    Heart attack Sister        visual merchandiser    Arthritis Daughter        very bad MVA    Hypertension Son    Seizures Son    Colon cancer Neg Hx    Liver cancer Neg Hx    Pancreatic disease Neg Hx    Past Surgical History:  Procedure Laterality Date   BASAL CELL CARCINOMA EXCISION     BREAST LUMPECTOMY WITH RADIOACTIVE SEED AND SENTINEL LYMPH NODE BIOPSY Right 08/22/2017   Procedure: BREAST LUMPECTOMY WITH RADIOACTIVE SEED AND SENTINEL LYMPH NODE BIOPSY;  Surgeon: Vanderbilt Ned, MD;  Location: [facility];  Service: General;  Laterality: Right;   CATARACT EXTRACTION     COLONOSCOPY  2015   Dr. Luis: Hemorrhoids, diverticulosis.  Next colonoscopy in 5 years due to previous history of adenomatous colon polyps   COLONOSCOPY N/A 11/18/2019   Procedure: COLONOSCOPY;  Surgeon: Shaaron Lamar HERO, MD;  Location: [facility];  Service: Endoscopy;  Laterality: N/A;  9:30   CYSTOSCOPY     cystscopy     ESOPHAGOGASTRODUODENOSCOPY N/A 01/28/2015   Dr.Rourk- 3 small prepyloric/gastric ulcers- likely culprits causing bleeding. hiatal hernia, small gastic  polyps not manipulated.   ESOPHAGOGASTRODUODENOSCOPY N/A 05/26/2015   Dr. Shaaron: Small hiatal hernia, previous gastric ulcers completely healed.   KNEE SURGERY     LIVER BIOPSY  10/09/2018   TONSILLECTOMY     TUBAL LIGATION     Social History[1] Social History   Social History Narrative   Son in [place], granddaughter in [place]   Very socially active     Immunization History  Administered Date(s) Administered   DTaP 10/28/2014   Fluad Quad(high Dose 65+) 10/13/2021   Hepatitis A 04/03/2016   INFLUENZA, HIGH DOSE SEASONAL PF 10/28/2014, 10/11/2015, 10/10/2016, 10/14/2017, 03/20/2018, 10/17/2018, 03/23/2019, 10/27/2019,  09/05/2020, 11/07/2023   Influenza,inj,Quad PF,6+ Mos 10/18/2014   Influenza,inj,quad, With Preservative 10/09/2018   Influenza-Unspecified 12/09/2012, 11/09/2013, 10/09/2022   Moderna Covid-19 Fall Seasonal Vaccine 62yrs & older 12/01/2021   Moderna Covid-19 Vaccine Bivalent Booster 64yrs & up 10/10/2020   Moderna Sars-Covid-2 Vaccination 02/20/2019, 03/21/2019, 03/23/2019, 09/25/2019   PNEUMOCOCCAL CONJUGATE-20 09/29/2020   Pneumococcal Conjugate-13 12/09/2013   Pneumococcal Polysaccharide-23 05/04/2011, 10/22/2013, 10/28/2014, 03/20/2018, 10/27/2019   Pneumococcal-Unspecified 09/28/2020   Respiratory Syncytial Virus Vaccine,Recomb Aduvanted(Arexvy) 01/02/2022   Tdap 06/09/2003, 06/09/2013   Typhoid Live 04/03/2016   Zoster Recombinant(Shingrix) 11/21/2020, 12/19/2020, 03/07/2021   Zoster, Live 04/21/2010     Objective: Vital Signs: There were no vitals taken for this visit.   Physical Exam   Musculoskeletal Exam: ***  CDAI Exam: CDAI Score: -- Patient Global: --; Provider Global: -- Swollen: --; Tender: -- Joint Exam 02/13/2024   No joint exam has been documented for this visit   There is currently no information documented on the homunculus. Go to the Rheumatology activity and complete the homunculus joint exam.  Investigation: No additional findings.  Imaging: CT ABDOMEN PELVIS W WO CONTRAST Result Date: 01/23/2024 CLINICAL DATA:  Autoimmune hepatitis. EXAM: CT ABDOMEN AND PELVIS WITHOUT AND WITH CONTRAST TECHNIQUE: Multidetector CT imaging of the abdomen and pelvis was performed following the standard protocol before and following the bolus administration of intravenous contrast. RADIATION DOSE REDUCTION: This exam was performed according to the departmental dose-optimization program which includes automated exposure control, adjustment of the mA and/or kV according to patient size and/or use of iterative reconstruction technique. CONTRAST:  OMNIPAQUE  IOHEXOL  300  MG/ML  SOLN COMPARISON:  CT scan from 2019 FINDINGS: Lower chest: Mild basilar scarring changes. Mild bronchiectasis and tree-in-bud type nodularity in the right middle lobe suggesting chronic inflammation or atypical infection such as MAC. No worrisome pulmonary lesions. No pleural effusion. Hepatobiliary: Scattered hepatic cysts but no worrisome hepatic lesions or intrahepatic biliary dilatation. The gallbladder is unremarkable. The portal and hepatic veins are patent. No common bile duct dilatation. Pancreas: Unremarkable. No pancreatic ductal dilatation or surrounding inflammatory changes. Spleen: Normal in size without focal abnormality. Adrenals/Urinary Tract: Adrenal glands and kidneys are unremarkable. No worrisome renal lesions or renal calculi. Persistent right renal cysts not requiring any further imaging evaluation or follow-up. The delayed images do not demonstrate any significant collecting system abnormalities. The bladder is unremarkable. Stomach/Bowel: The stomach, duodenum, small bowel and colon are grossly normal without oral contrast. No inflammatory changes, mass lesions or obstructive findings. The appendix is normal. Moderate sigmoid colon diverticulosis is stable. Vascular/Lymphatic: Moderate atherosclerotic calcifications involving the aorta, iliac arteries and branch vessels but no aneurysm or dissection. The major venous structures are patent. No mesenteric or retroperitoneal mass or adenopathy. No pelvic adenopathy. Reproductive: The uterus is unremarkable. No adnexal mass. Patient has some type of pelvic floor relaxation device in place with  associated artifact. Other: No pelvic mass or adenopathy. No free pelvic fluid collections. No inguinal mass or adenopathy. No abdominal wall hernia or subcutaneous lesions. Musculoskeletal: Stable severe scoliosis and associated degenerative lumbar spondylosis. No bone lesions or fractures. IMPRESSION: 1. No acute abdominal/pelvic findings, mass  lesions or adenopathy. 2. Scattered hepatic cysts but no worrisome hepatic lesions or intrahepatic biliary dilatation. 3. Stable moderate sigmoid colon diverticulosis. 4. Stable severe scoliosis and associated degenerative lumbar spondylosis. 5. Mild bronchiectasis and tree-in-bud type nodularity in the right middle lobe suggesting chronic inflammation or atypical infection such as MAC. 6. Aortic atherosclerosis. Aortic Atherosclerosis (ICD10-I70.0). Electronically Signed   By: MYRTIS Stammer M.D.   On: 01/23/2024 19:23    Recent Labs: Lab Results  Component Value Date   WBC 7.4 10/04/2023   HGB 14.4 10/04/2023   PLT 245 10/04/2023   NA 143 10/04/2023   K 4.5 10/04/2023   CL 105 10/04/2023   CO2 24 10/04/2023   GLUCOSE 84 10/04/2023   BUN 10 10/04/2023   CREATININE 0.87 10/04/2023   BILITOT 0.5 10/04/2023   ALKPHOS 65 10/04/2023   AST 13 10/04/2023   ALT 14 10/04/2023   PROT 5.7 (L) 10/04/2023   ALBUMIN 3.9 10/04/2023   CALCIUM  9.2 10/04/2023   GFRAA 84 09/03/2019    Speciality Comments: Imuran-discontinued due to cholestatic liver injury and upper GI side effects.  Procedures:  No procedures performed Allergies: Azathioprine, Statins, Sulfa antibiotics, Methocarbamol , Naproxen , Lipitor [atorvastatin], Premarin [conjugated estrogens], Ampicillin, Avelox [moxifloxacin hcl in nacl], Azithromycin , Bactrim [sulfamethoxazole-trimethoprim], Crestor  [rosuvastatin  calcium ], Doxycycline , Levaquin [levofloxacin in d5w], Livalo  [pitavastatin ], Macrodantin [nitrofurantoin macrocrystal], and Trental [pentoxifylline er]   Assessment / Plan:     Visit Diagnoses: No diagnosis found.  Orders: No orders of the defined types were placed in this encounter.  No orders of the defined types were placed in this encounter.   Face-to-face time spent with patient was *** minutes. Greater than 50% of time was spent in counseling and coordination of care.  Follow-Up Instructions: No follow-ups on  file.   Daved JAYSON Gavel, CMA  Note - This record has been created using Animal nutritionist.  Chart creation errors have been sought, but may not always  have been located. Such creation errors do not reflect on  the standard of medical care.    [1]  Social History Tobacco Use   Smoking status: Former    Current packs/day: 0.00    Types: Cigarettes    Quit date: 06/06/1974    Years since quitting: 49.6    Passive exposure: Past   Smokeless tobacco: Never   Tobacco comments:    Quit x 40 years ago  Vaping Use   Vaping status: Never Used  Substance Use Topics   Alcohol use: No    Alcohol/week: 0.0 standard drinks of alcohol   Drug use: No   "

## 2024-02-13 ENCOUNTER — Ambulatory Visit: Admitting: Rheumatology

## 2024-02-13 DIAGNOSIS — M51369 Other intervertebral disc degeneration, lumbar region without mention of lumbar back pain or lower extremity pain: Secondary | ICD-10-CM

## 2024-02-13 DIAGNOSIS — G8929 Other chronic pain: Secondary | ICD-10-CM

## 2024-02-13 DIAGNOSIS — C50411 Malignant neoplasm of upper-outer quadrant of right female breast: Secondary | ICD-10-CM

## 2024-02-13 DIAGNOSIS — E559 Vitamin D deficiency, unspecified: Secondary | ICD-10-CM

## 2024-02-13 DIAGNOSIS — M5134 Other intervertebral disc degeneration, thoracic region: Secondary | ICD-10-CM

## 2024-02-13 DIAGNOSIS — M8589 Other specified disorders of bone density and structure, multiple sites: Secondary | ICD-10-CM

## 2024-02-13 DIAGNOSIS — M5030 Other cervical disc degeneration, unspecified cervical region: Secondary | ICD-10-CM

## 2024-02-13 DIAGNOSIS — Z8719 Personal history of other diseases of the digestive system: Secondary | ICD-10-CM

## 2024-02-13 DIAGNOSIS — K449 Diaphragmatic hernia without obstruction or gangrene: Secondary | ICD-10-CM

## 2024-02-13 DIAGNOSIS — K754 Autoimmune hepatitis: Secondary | ICD-10-CM

## 2024-02-13 DIAGNOSIS — M19042 Primary osteoarthritis, left hand: Secondary | ICD-10-CM

## 2024-02-13 DIAGNOSIS — R7689 Other specified abnormal immunological findings in serum: Secondary | ICD-10-CM

## 2024-02-13 DIAGNOSIS — M19071 Primary osteoarthritis, right ankle and foot: Secondary | ICD-10-CM

## 2024-02-13 DIAGNOSIS — M11261 Other chondrocalcinosis, right knee: Secondary | ICD-10-CM

## 2024-02-13 DIAGNOSIS — Z8709 Personal history of other diseases of the respiratory system: Secondary | ICD-10-CM

## 2024-02-13 DIAGNOSIS — Z860101 Personal history of adenomatous and serrated colon polyps: Secondary | ICD-10-CM

## 2024-02-13 DIAGNOSIS — Z8639 Personal history of other endocrine, nutritional and metabolic disease: Secondary | ICD-10-CM

## 2024-02-13 DIAGNOSIS — M25461 Effusion, right knee: Secondary | ICD-10-CM

## 2024-02-13 DIAGNOSIS — Z79899 Other long term (current) drug therapy: Secondary | ICD-10-CM

## 2024-04-02 ENCOUNTER — Ambulatory Visit: Payer: Self-pay | Admitting: Family

## 2024-04-16 ENCOUNTER — Ambulatory Visit: Admitting: Rheumatology
